# Patient Record
Sex: Male | Born: 1950 | Race: White
Health system: Southern US, Community
[De-identification: ages and names within clinical notes are randomized; demographics above are authoritative.]

## PROBLEM LIST (undated history)

## (undated) DIAGNOSIS — I10 Essential (primary) hypertension: Secondary | ICD-10-CM

## (undated) DIAGNOSIS — N189 Chronic kidney disease, unspecified: Secondary | ICD-10-CM

## (undated) DIAGNOSIS — E785 Hyperlipidemia, unspecified: Secondary | ICD-10-CM

## (undated) DIAGNOSIS — I2510 Atherosclerotic heart disease of native coronary artery without angina pectoris: Secondary | ICD-10-CM

## (undated) DIAGNOSIS — H269 Unspecified cataract: Secondary | ICD-10-CM

## (undated) DIAGNOSIS — Z86010 Personal history of colonic polyps: Secondary | ICD-10-CM

## (undated) DIAGNOSIS — M1990 Unspecified osteoarthritis, unspecified site: Secondary | ICD-10-CM

## (undated) DIAGNOSIS — Z87442 Personal history of urinary calculi: Secondary | ICD-10-CM

## (undated) HISTORY — PX: JOINT REPLACEMENT: SHX530

## (undated) HISTORY — PX: TOTAL KNEE ARTHROPLASTY: SHX125

## (undated) HISTORY — DX: Chronic kidney disease, unspecified: N18.9

## (undated) HISTORY — PX: SHOULDER SURGERY: SHX246

## (undated) HISTORY — PX: CARDIAC CATHETERIZATION: SHX172

## (undated) HISTORY — DX: Personal history of urinary calculi: Z87.442

## (undated) HISTORY — PX: APPENDECTOMY: SHX54

## (undated) HISTORY — DX: Unspecified cataract: H26.9

## (undated) HISTORY — DX: Essential (primary) hypertension: I10

## (undated) HISTORY — PX: CARPAL TUNNEL RELEASE: SHX101

## (undated) HISTORY — PX: EYE SURGERY: SHX253

## (undated) HISTORY — PX: KNEE ARTHROSCOPY: SHX127

## (undated) HISTORY — PX: CATARACT EXTRACTION: SUR2

## (undated) HISTORY — DX: Hyperlipidemia, unspecified: E78.5

## (undated) HISTORY — PX: HERNIA REPAIR: SHX51

## (undated) HISTORY — DX: Unspecified osteoarthritis, unspecified site: M19.90

## (undated) HISTORY — PX: MENISCUS REPAIR: SHX5179

## (undated) HISTORY — PX: KNEE SURGERY: SHX244

## (undated) HISTORY — DX: Personal history of colonic polyps: Z86.010

## (undated) HISTORY — PX: REPLACEMENT TOTAL KNEE: SUR1224

---

## 1998-06-26 ENCOUNTER — Ambulatory Visit (HOSPITAL_BASED_OUTPATIENT_CLINIC_OR_DEPARTMENT_OTHER): Admission: RE | Admit: 1998-06-26 | Discharge: 1998-06-26 | Payer: Self-pay | Admitting: General Surgery

## 2003-02-15 ENCOUNTER — Encounter: Admission: RE | Admit: 2003-02-15 | Discharge: 2003-02-15 | Payer: Self-pay | Admitting: Orthopaedic Surgery

## 2005-03-26 ENCOUNTER — Ambulatory Visit (HOSPITAL_COMMUNITY): Admission: RE | Admit: 2005-03-26 | Discharge: 2005-03-26 | Payer: Self-pay | Admitting: Orthopedic Surgery

## 2005-06-21 ENCOUNTER — Encounter: Payer: Self-pay | Admitting: Internal Medicine

## 2005-11-24 ENCOUNTER — Ambulatory Visit: Payer: Self-pay | Admitting: Internal Medicine

## 2005-12-02 ENCOUNTER — Ambulatory Visit: Payer: Self-pay | Admitting: Internal Medicine

## 2006-02-07 ENCOUNTER — Ambulatory Visit: Payer: Self-pay | Admitting: Internal Medicine

## 2007-01-17 ENCOUNTER — Ambulatory Visit: Payer: Self-pay | Admitting: Internal Medicine

## 2007-01-17 DIAGNOSIS — M1990 Unspecified osteoarthritis, unspecified site: Secondary | ICD-10-CM

## 2007-01-17 DIAGNOSIS — Z87442 Personal history of urinary calculi: Secondary | ICD-10-CM | POA: Insufficient documentation

## 2007-01-17 HISTORY — DX: Personal history of urinary calculi: Z87.442

## 2007-01-17 HISTORY — DX: Unspecified osteoarthritis, unspecified site: M19.90

## 2007-01-17 LAB — CONVERTED CEMR LAB: PSA: 2.08 ng/mL (ref 0.10–4.00)

## 2007-01-19 ENCOUNTER — Ambulatory Visit: Payer: Self-pay | Admitting: Internal Medicine

## 2007-02-16 ENCOUNTER — Telehealth: Payer: Self-pay | Admitting: Internal Medicine

## 2007-11-14 ENCOUNTER — Ambulatory Visit: Payer: Self-pay | Admitting: Internal Medicine

## 2007-11-14 LAB — CONVERTED CEMR LAB
ALT: 33 units/L (ref 0–53)
ALT: 30 units/L (ref 0–40)
AST: 38 units/L — ABNORMAL HIGH (ref 0–37)
AST: 31 units/L (ref 0–37)
Albumin: 4.5 g/dL (ref 3.5–5.2)
Albumin: 4.3 g/dL (ref 3.5–5.2)
Alkaline Phosphatase: 40 units/L (ref 39–117)
Alkaline Phosphatase: 44 units/L (ref 39–117)
BUN: 13 mg/dL (ref 6–23)
BUN: 11 mg/dL (ref 6–23)
Basophils Absolute: 0.1 10*3/uL (ref 0.0–0.1)
Basophils Absolute: 0 10*3/uL (ref 0.0–0.1)
Basophils Relative: 2 % (ref 0.0–3.0)
Basophils Relative: 0.5 % (ref 0.0–1.0)
Bilirubin, Direct: 0.2 mg/dL (ref 0.0–0.3)
CO2: 29 meq/L (ref 19–32)
CO2: 28 meq/L (ref 19–32)
Calcium: 9.6 mg/dL (ref 8.4–10.5)
Calcium: 9.6 mg/dL (ref 8.4–10.5)
Chloride: 107 meq/L (ref 96–112)
Chloride: 105 meq/L (ref 96–112)
Cholesterol: 292 mg/dL (ref 0–200)
Cholesterol: 278 mg/dL (ref 0–200)
Creatinine, Ser: 0.8 mg/dL (ref 0.4–1.5)
Creatinine, Ser: 0.9 mg/dL (ref 0.4–1.5)
Direct LDL: 155 mg/dL
Direct LDL: 179.9 mg/dL
Eosinophils Absolute: 0.2 10*3/uL (ref 0.0–0.7)
Eosinophils Absolute: 0.1 10*3/uL (ref 0.0–0.6)
Eosinophils Relative: 2.7 % (ref 0.0–5.0)
Eosinophils Relative: 2.1 % (ref 0.0–5.0)
GFR calc Af Amer: 128 mL/min
GFR calc Af Amer: 113 mL/min
GFR calc non Af Amer: 106 mL/min
GFR calc non Af Amer: 93 mL/min
Glucose, Bld: 93 mg/dL (ref 70–99)
Glucose, Bld: 89 mg/dL (ref 70–99)
HCT: 43.5 % (ref 39.0–52.0)
HCT: 43.1 % (ref 39.0–52.0)
HDL: 61.2 mg/dL (ref >39.0)
HDL: 58.3 mg/dL (ref >39.0)
Hemoglobin: 14.9 g/dL (ref 13.0–17.0)
Hemoglobin: 14.7 g/dL (ref 13.0–17.0)
Lymphocytes Relative: 26.1 % (ref 12.0–46.0)
Lymphocytes Relative: 33.6 % (ref 12.0–46.0)
MCHC: 34.2 g/dL (ref 30.0–36.0)
MCHC: 34.2 g/dL (ref 30.0–36.0)
MCV: 93.5 fL (ref 78.0–100.0)
MCV: 91.6 fL (ref 78.0–100.0)
Monocytes Absolute: 0.7 10*3/uL (ref 0.1–1.0)
Monocytes Absolute: 0.5 10*3/uL (ref 0.2–0.7)
Monocytes Relative: 11.9 % (ref 3.0–12.0)
Monocytes Relative: 12.9 % — ABNORMAL HIGH (ref 3.0–11.0)
Neutro Abs: 3.2 10*3/uL (ref 1.4–7.7)
Neutro Abs: 2.2 10*3/uL (ref 1.4–7.7)
Neutrophils Relative %: 57.3 % (ref 43.0–77.0)
Neutrophils Relative %: 50.9 % (ref 43.0–77.0)
PSA: 2.61 ng/mL (ref 0.10–4.00)
PSA: 1.75 ng/mL (ref 0.10–4.00)
Platelets: 217 10*3/uL (ref 150–400)
Platelets: 229 10*3/uL (ref 150–400)
Potassium: 4.8 meq/L (ref 3.5–5.1)
Potassium: 4.3 meq/L (ref 3.5–5.1)
RBC: 4.65 M/uL (ref 4.22–5.81)
RBC: 4.71 M/uL (ref 4.22–5.81)
RDW: 11.6 % (ref 11.5–14.6)
RDW: 11.7 % (ref 11.5–14.6)
Sodium: 140 meq/L (ref 135–145)
Sodium: 140 meq/L (ref 135–145)
TSH: 1.38 microintl units/mL (ref 0.35–5.50)
TSH: 0.65 microintl units/mL (ref 0.35–5.50)
Total Bilirubin: 1.2 mg/dL (ref 0.3–1.2)
Total Bilirubin: 1 mg/dL (ref 0.3–1.2)
Total CHOL/HDL Ratio: 4.8
Total CHOL/HDL Ratio: 4.8
Total Protein: 7.7 g/dL (ref 6.0–8.3)
Total Protein: 7.3 g/dL (ref 6.0–8.3)
Triglycerides: 345 mg/dL (ref 0–149)
Triglycerides: 132 mg/dL (ref 0–149)
VLDL: 69 mg/dL — ABNORMAL HIGH (ref 0–40)
VLDL: 26 mg/dL (ref 0–40)
WBC: 5.7 10*3/uL (ref 4.5–10.5)
WBC: 4.2 10*3/uL — ABNORMAL LOW (ref 4.5–10.5)

## 2007-11-21 ENCOUNTER — Ambulatory Visit: Payer: Self-pay | Admitting: Internal Medicine

## 2007-11-21 DIAGNOSIS — E785 Hyperlipidemia, unspecified: Secondary | ICD-10-CM

## 2007-11-21 HISTORY — DX: Hyperlipidemia, unspecified: E78.5

## 2007-11-29 ENCOUNTER — Telehealth: Payer: Self-pay | Admitting: Internal Medicine

## 2007-12-14 ENCOUNTER — Ambulatory Visit: Payer: Self-pay | Admitting: Internal Medicine

## 2007-12-28 ENCOUNTER — Encounter: Payer: Self-pay | Admitting: Internal Medicine

## 2007-12-28 ENCOUNTER — Ambulatory Visit: Payer: Self-pay | Admitting: Internal Medicine

## 2007-12-28 LAB — HM COLONOSCOPY

## 2008-01-01 ENCOUNTER — Encounter: Payer: Self-pay | Admitting: Internal Medicine

## 2008-01-22 ENCOUNTER — Inpatient Hospital Stay (HOSPITAL_COMMUNITY): Admission: RE | Admit: 2008-01-22 | Discharge: 2008-01-25 | Payer: Self-pay | Admitting: Orthopedic Surgery

## 2008-02-12 ENCOUNTER — Encounter: Admission: RE | Admit: 2008-02-12 | Discharge: 2008-02-28 | Payer: Self-pay | Admitting: Orthopedic Surgery

## 2008-03-04 ENCOUNTER — Encounter: Admission: RE | Admit: 2008-03-04 | Discharge: 2008-03-12 | Payer: Self-pay | Admitting: Orthopedic Surgery

## 2008-03-11 ENCOUNTER — Ambulatory Visit (HOSPITAL_COMMUNITY): Admission: RE | Admit: 2008-03-11 | Discharge: 2008-03-11 | Payer: Self-pay | Admitting: Orthopedic Surgery

## 2008-09-24 ENCOUNTER — Ambulatory Visit: Payer: Self-pay | Admitting: Internal Medicine

## 2008-09-24 DIAGNOSIS — H6590 Unspecified nonsuppurative otitis media, unspecified ear: Secondary | ICD-10-CM | POA: Insufficient documentation

## 2008-12-24 ENCOUNTER — Ambulatory Visit: Payer: Self-pay | Admitting: Internal Medicine

## 2009-02-05 ENCOUNTER — Ambulatory Visit: Payer: Self-pay | Admitting: Internal Medicine

## 2009-02-05 LAB — CONVERTED CEMR LAB
ALT: 26 units/L (ref 0–53)
AST: 33 units/L (ref 0–37)
Albumin: 4.4 g/dL (ref 3.5–5.2)
Alkaline Phosphatase: 41 units/L (ref 39–117)
BUN: 11 mg/dL (ref 6–23)
Basophils Absolute: 0 10*3/uL (ref 0.0–0.1)
Basophils Relative: 0.6 % (ref 0.0–3.0)
Bilirubin Urine: NEGATIVE
Bilirubin, Direct: 0.1 mg/dL (ref 0.0–0.3)
Blood in Urine, dipstick: NEGATIVE
CO2: 29 meq/L (ref 19–32)
Calcium: 9.6 mg/dL (ref 8.4–10.5)
Chloride: 106 meq/L (ref 96–112)
Cholesterol: 268 mg/dL — ABNORMAL HIGH (ref 0–200)
Creatinine, Ser: 0.9 mg/dL (ref 0.4–1.5)
Direct LDL: 157.7 mg/dL
Eosinophils Absolute: 0.1 10*3/uL (ref 0.0–0.7)
Eosinophils Relative: 2.4 % (ref 0.0–5.0)
GFR calc non Af Amer: 91.74 mL/min (ref >60)
Glucose, Bld: 98 mg/dL (ref 70–99)
Glucose, Urine, Semiquant: NEGATIVE
HCT: 42.6 % (ref 39.0–52.0)
HDL: 59.5 mg/dL (ref >39.00)
Hemoglobin: 14.5 g/dL (ref 13.0–17.0)
Ketones, urine, test strip: NEGATIVE
Lymphocytes Relative: 34.3 % (ref 12.0–46.0)
Lymphs Abs: 1.7 10*3/uL (ref 0.7–4.0)
MCHC: 34.1 g/dL (ref 30.0–36.0)
MCV: 95.3 fL (ref 78.0–100.0)
Monocytes Absolute: 0.6 10*3/uL (ref 0.1–1.0)
Monocytes Relative: 12 % (ref 3.0–12.0)
Neutro Abs: 2.5 10*3/uL (ref 1.4–7.7)
Neutrophils Relative %: 50.7 % (ref 43.0–77.0)
Nitrite: NEGATIVE
PSA: 1.44 ng/mL (ref 0.10–4.00)
Platelets: 189 10*3/uL (ref 150.0–400.0)
Potassium: 4.6 meq/L (ref 3.5–5.1)
Protein, U semiquant: NEGATIVE
RBC: 4.47 M/uL (ref 4.22–5.81)
RDW: 11.4 % — ABNORMAL LOW (ref 11.5–14.6)
Sodium: 142 meq/L (ref 135–145)
Specific Gravity, Urine: 1.02
TSH: 0.92 microintl units/mL (ref 0.35–5.50)
Total Bilirubin: 0.9 mg/dL (ref 0.3–1.2)
Total CHOL/HDL Ratio: 5
Total Protein: 7.7 g/dL (ref 6.0–8.3)
Triglycerides: 269 mg/dL — ABNORMAL HIGH (ref 0.0–149.0)
Urobilinogen, UA: 0.2
VLDL: 53.8 mg/dL — ABNORMAL HIGH (ref 0.0–40.0)
WBC Urine, dipstick: NEGATIVE
WBC: 4.9 10*3/uL (ref 4.5–10.5)
pH: 5.5

## 2009-02-12 ENCOUNTER — Ambulatory Visit: Payer: Self-pay | Admitting: Internal Medicine

## 2009-02-12 DIAGNOSIS — Z860100 Personal history of colon polyps, unspecified: Secondary | ICD-10-CM | POA: Insufficient documentation

## 2009-02-12 HISTORY — DX: Personal history of colon polyps, unspecified: Z86.0100

## 2009-02-12 HISTORY — DX: Personal history of colonic polyps: Z86.010

## 2009-04-07 ENCOUNTER — Ambulatory Visit: Payer: Self-pay | Admitting: Internal Medicine

## 2009-04-07 DIAGNOSIS — M25519 Pain in unspecified shoulder: Secondary | ICD-10-CM | POA: Insufficient documentation

## 2009-11-27 ENCOUNTER — Encounter: Payer: Self-pay | Admitting: Internal Medicine

## 2010-02-13 ENCOUNTER — Ambulatory Visit: Payer: Self-pay | Admitting: Internal Medicine

## 2010-03-25 ENCOUNTER — Ambulatory Visit
Admission: RE | Admit: 2010-03-25 | Discharge: 2010-03-25 | Payer: Self-pay | Source: Home / Self Care | Attending: Internal Medicine | Admitting: Internal Medicine

## 2010-03-25 ENCOUNTER — Other Ambulatory Visit: Payer: Self-pay | Admitting: Internal Medicine

## 2010-03-25 LAB — CONVERTED CEMR LAB
Bilirubin Urine: NEGATIVE
Blood in Urine, dipstick: NEGATIVE
Glucose, Urine, Semiquant: NEGATIVE
Ketones, urine, test strip: NEGATIVE
Nitrite: NEGATIVE
Protein, U semiquant: NEGATIVE
Specific Gravity, Urine: <1.005
Urobilinogen, UA: 0.2
WBC Urine, dipstick: NEGATIVE
pH: 5.5

## 2010-03-25 LAB — BASIC METABOLIC PANEL
BUN: 10 mg/dL (ref 6–23)
CO2: 30 mEq/L (ref 19–32)
Calcium: 9.8 mg/dL (ref 8.4–10.5)
Chloride: 102 mEq/L (ref 96–112)
Creatinine, Ser: 0.8 mg/dL (ref 0.4–1.5)
GFR: 100.34 mL/min (ref >60.00)
Glucose, Bld: 90 mg/dL (ref 70–99)
Potassium: 4.4 mEq/L (ref 3.5–5.1)
Sodium: 138 mEq/L (ref 135–145)

## 2010-03-25 LAB — LDL CHOLESTEROL, DIRECT: Direct LDL: 166.4 mg/dL

## 2010-03-25 LAB — CBC WITH DIFFERENTIAL/PLATELET
Basophils Absolute: 0 10*3/uL (ref 0.0–0.1)
Basophils Relative: 0.8 % (ref 0.0–3.0)
Eosinophils Absolute: 0.4 10*3/uL (ref 0.0–0.7)
Eosinophils Relative: 5.9 % — ABNORMAL HIGH (ref 0.0–5.0)
HCT: 42.1 % (ref 39.0–52.0)
Hemoglobin: 14.4 g/dL (ref 13.0–17.0)
Lymphocytes Relative: 27.2 % (ref 12.0–46.0)
Lymphs Abs: 1.7 10*3/uL (ref 0.7–4.0)
MCHC: 34.3 g/dL (ref 30.0–36.0)
MCV: 93.9 fl (ref 78.0–100.0)
Monocytes Absolute: 0.7 10*3/uL (ref 0.1–1.0)
Monocytes Relative: 11.2 % (ref 3.0–12.0)
Neutro Abs: 3.4 10*3/uL (ref 1.4–7.7)
Neutrophils Relative %: 54.9 % (ref 43.0–77.0)
Platelets: 201 10*3/uL (ref 150.0–400.0)
RBC: 4.48 Mil/uL (ref 4.22–5.81)
RDW: 12.2 % (ref 11.5–14.6)
WBC: 6.2 10*3/uL (ref 4.5–10.5)

## 2010-03-25 LAB — HEPATIC FUNCTION PANEL
ALT: 35 U/L (ref 0–53)
AST: 40 U/L — ABNORMAL HIGH (ref 0–37)
Albumin: 4.5 g/dL (ref 3.5–5.2)
Alkaline Phosphatase: 41 U/L (ref 39–117)
Bilirubin, Direct: 0.1 mg/dL (ref 0.0–0.3)
Total Bilirubin: 1.1 mg/dL (ref 0.3–1.2)
Total Protein: 7.4 g/dL (ref 6.0–8.3)

## 2010-03-25 LAB — LIPID PANEL
Cholesterol: 260 mg/dL — ABNORMAL HIGH (ref 0–200)
HDL: 80 mg/dL (ref >39.00)
Total CHOL/HDL Ratio: 3
Triglycerides: 139 mg/dL (ref 0.0–149.0)
VLDL: 27.8 mg/dL (ref 0.0–40.0)

## 2010-03-25 LAB — PSA: PSA: 2.36 ng/mL (ref 0.10–4.00)

## 2010-03-25 LAB — TSH: TSH: 0.89 u[IU]/mL (ref 0.35–5.50)

## 2010-03-31 ENCOUNTER — Encounter: Payer: Self-pay | Admitting: Internal Medicine

## 2010-03-31 ENCOUNTER — Ambulatory Visit
Admission: RE | Admit: 2010-03-31 | Discharge: 2010-03-31 | Payer: Self-pay | Source: Home / Self Care | Attending: Internal Medicine | Admitting: Internal Medicine
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
## 2010-03-31 NOTE — Assessment & Plan Note (Signed)
Summary: cpx/njr   Vital Signs:  Patient profile:   60 year old male Weight:      163 pounds BMI:     23.47 BP sitting:   142 / 80  (left arm) Cuff size:   regular  Vitals Entered By: Raechel Ache, RN (February 12, 2009 9:35 AM) CC: CPX, labs done.   CC:  CPX and labs done.Marland Kitchen  History of Present Illness:  60 year old patient who is seen today for a health maintenance exam.  He does quite well clinically.  He has osteoarthritis and is status post right total knee replacement surgery.  13 months ago.  He has dyslipidemia with a high HDL.  No concerns or complaints.  Allergies: No Known Drug Allergies  Past History:  Past Medical History: Hyperlipidemia Osteoarthritis Nephrolithiasis, hx of Colonic polyps, hx of  Past Surgical History: Appendectomy Inguinal herniorrhaphy bilateral bilateral knee arthroscopic surgery R TKR 11/09  colonoscopy 2004, 2009  Family History: Reviewed history from 11/21/2007 and no changes required. father died age 60, status post CABG;  complications of colon cancer  mother age 60  history of asthma, status post thyroidectomy, still lives independently  Uncle died of MI at age 60  Three sisters are well  Social History: Reviewed history from 11/21/2007 and no changes required. Never Smoked Regular exercise-yes  Review of Systems  The patient denies anorexia, fever, weight loss, weight gain, vision loss, decreased hearing, hoarseness, chest pain, syncope, dyspnea on exertion, peripheral edema, prolonged cough, headaches, hemoptysis, abdominal pain, melena, hematochezia, severe indigestion/heartburn, hematuria, incontinence, genital sores, muscle weakness, suspicious skin lesions, transient blindness, difficulty walking, depression, unusual weight change, abnormal bleeding, enlarged lymph nodes, angioedema, breast masses, and testicular masses.    Physical Exam  General:  Well-developed,well-nourished,in no acute distress;  alert,appropriate and cooperative throughout examination; 140 over 80 Head:  Normocephalic and atraumatic without obvious abnormalities. No apparent alopecia or balding. Eyes:  No corneal or conjunctival inflammation noted. EOMI. Perrla. Funduscopic exam benign, without hemorrhages, exudates or papilledema. Vision grossly normal. Ears:  External ear exam shows no significant lesions or deformities.  Otoscopic examination reveals clear canals, tympanic membranes are intact bilaterally without bulging, retraction, inflammation or discharge. Hearing is grossly normal bilaterally. Nose:  External nasal examination shows no deformity or inflammation. Nasal mucosa are pink and moist without lesions or exudates. Mouth:  Oral mucosa and oropharynx without lesions or exudates.  Teeth in good repair. Neck:  No deformities, masses, or tenderness noted. Chest Wall:  No deformities, masses, tenderness or gynecomastia noted. Breasts:  No masses or gynecomastia noted Lungs:  Normal respiratory effort, chest expands symmetrically. Lungs are clear to auscultation, no crackles or wheezes. Heart:  Normal rate and regular rhythm. S1 and S2 normal without gallop, murmur, click, rub or other extra sounds. Abdomen:  Bowel sounds positive,abdomen soft and non-tender without masses, organomegaly or hernias noted. Rectal:  No external abnormalities noted. Normal sphincter tone. No rectal masses or tenderness. Genitalia:  Testes bilaterally descended without nodularity, tenderness or masses. No scrotal masses or lesions. No penis lesions or urethral discharge. Prostate:  1+ enlarged.  1+ enlarged.   Msk:  No deformity or scoliosis noted of thoracic or lumbar spine.   Pulses:  R and L carotid,radial,femoral,dorsalis pedis and posterior tibial pulses are full and equal bilaterally Extremities:  No clubbing, cyanosis, edema, or deformity noted with normal full range of motion of all joints.   Neurologic:  No cranial nerve  deficits noted. Station and gait are normal. Plantar  reflexes are down-going bilaterally. DTRs are symmetrical throughout. Sensory, motor and coordinative functions appear intact. Skin:  Intact without suspicious lesions or rashes Cervical Nodes:  No lymphadenopathy noted Axillary Nodes:  No palpable lymphadenopathy Inguinal Nodes:  No significant adenopathy Psych:  Cognition and judgment appear intact. Alert and cooperative with normal attention span and concentration. No apparent delusions, illusions, hallucinations   Impression & Recommendations:  Problem # 1:  COLONIC POLYPS, HX OF (ICD-V12.72)  Problem # 2:  NEPHROLITHIASIS, HX OF (ICD-V13.01)  Problem # 3:  OSTEOARTHRITIS (ICD-715.90)  The following medications were removed from the medication list:    Vicodin 5-500 Mg Tabs (Hydrocodone-acetaminophen) ..... One by mouth q 6 hours as needed for pain  The following medications were removed from the medication list:    Vicodin 5-500 Mg Tabs (Hydrocodone-acetaminophen) ..... One by mouth q 6 hours as needed for pain  Problem # 4:  HYPERLIPIDEMIA (ICD-272.4)  Complete Medication List: 1)  Viagra 100 Mg Tabs (Sildenafil citrate) .... Uad  Patient Instructions: 1)  Please schedule a follow-up appointment in 1 year. 2)  Limit your Sodium (Salt). 3)  It is important that you exercise regularly at least 20 minutes 5 times a week. If you develop chest pain, have severe difficulty breathing, or feel very tired , stop exercising immediately and seek medical attention. 4)  Check your Blood Pressure regularly. If it is above:150/90  you should make an appointment. Prescriptions: VIAGRA 100 MG TABS (SILDENAFIL CITRATE) UAD  #12 x 6   Entered and Authorized by:   Gordy Savers  MD   Signed by:   Gordy Savers  MD on 02/12/2009   Method used:   Print then Give to Patient   RxID:   [id number]

## 2010-03-31 NOTE — Procedures (Signed)
Summary: Colonoscopy   Colonoscopy  Procedure date:  12/28/2007  Findings:      Location:  [facility].    Procedures Next Due Date:    Colonoscopy: 12/2012  Patient Name: Timothy [name], Timothy [name]. MRN:  Procedure Procedures: Colonoscopy CPT: [phone].    with polypectomy. CPT: [id number].  Personnel: Endoscopist: Wilhemina Bonito. Marina Goodell, MD.  Exam Location: Exam performed in Outpatient Clinic. Outpatient  Patient Consent: Procedure, Alternatives, Risks and Benefits discussed, consent obtained, from patient. Consent was obtained by the RN.  Indications  Increased Risk Screening: For family history of colorectal neoplasia, in  parent  History  Current Medications: Patient is not currently taking Coumadin.  Pre-Exam Physical: Performed Dec 28, 2007. Cardio-pulmonary exam, Rectal exam, HEENT exam , Abdominal exam, Mental status exam WNL.  Comments: Pt. history reviewed/updated, physical exam performed prior to initiation of sedation?yes Exam Exam: Extent of exam reached: Cecum, extent intended: Cecum.  The cecum was identified by appendiceal orifice and IC valve. Time to Cecum: 00:03: 30. Time for Withdrawl: 00:10:36. Colon retroflexion performed. Images taken. ASA Classification: I. Tolerance: excellent.  Monitoring: Pulse and BP monitoring, Oximetry used. Supplemental O2 given.  Colon Prep Used Movi prep for colon prep. Prep results: excellent.  Sedation Meds: Patient assessed and found to be appropriate for moderate (conscious) sedation. Fentanyl 100 mcg. given IV. Versed 12 given IV.  Findings NORMAL EXAM: Cecum to Rectum.  POLYP: Cecum, Maximum size: 5 mm. Procedure:  snare without cautery, removed, retrieved, Polyp sent to pathology. ICD9: Colon Polyps: 211. 3.  DIVERTICULOSIS: Sigmoid Colon. ICD9: Diverticulosis, Colon: 562.10.   Assessment  Diagnoses: 211.3: Colon Polyps.  one.  562.10: Diverticulosis, Colon.  mild.  455.0: Hemorrhoids, Internal.    Events  Unplanned Interventions: No intervention was required.  Unplanned Events: There were no complications. Plans Disposition: After procedure patient sent to recovery. After recovery patient sent home.  Scheduling/Referral: Colonoscopy, to Wilhemina Bonito. Marina Goodell, MD, in 5 years (fam. hx),    cc: Eleonore Chiquito, MD   REPORT OF SURGICAL PATHOLOGY   Case #: [id number] Patient Name: Timothy [name]. Office Chart Number:  [id number]   MRN: [id number] Pathologist: Alden Server A. Delila Spence, MD DOB/Age  60/07/1949 (Age: 2)    Gender: M Date Taken:  12/28/2007 Date Received: 12/29/2007   FINAL DIAGNOSIS   ***MICROSCOPIC EXAMINATION AND DIAGNOSIS***   COLON, POLYP(S):  HYPERPLASTIC POLYP.  NO ADENOMATOUS CHANGE OR MALIGNANCY IDENTIFIED.   mw Date Reported:  01/01/2008     Alden Server A. Delila Spence, MD    January 01, 2008 MRN: [id number]    Timothy [name] [address] [place], [place]  [address]    Dear Timothy [name],  I am pleased to inform you that the colon polyp(s) removed during your recent colonoscopy was (were) found to be benign (no cancer detected) upon pathologic examination.  I recommend you have a repeat colonoscopy examination in 5 years to look for recurrent polyps, as having colon polyps increases your risk for having recurrent polyps or even colon cancer in the future.  Should you develop new or worsening symptoms of abdominal pain, bowel habit changes or bleeding from the rectum or bowels, please schedule an evaluation with either your primary care physician or with me.  Additional information/recommendations:  X._ No further action with gastroenterology is needed at this time. Please      follow-up with your primary care physician for your other healthcare      needs.   Please call us if you are having persistent  problems or have questions about your condition that have not been fully answered at this time.  Sincerely,  Hilarie Fredrickson MD  This report was created  from the original endoscopy report, which was reviewed and signed by the above listed endoscopist.

## 2010-03-31 NOTE — Letter (Signed)
Summary: [facility]  [facility]   Imported By: Maryln Gottron 12/03/2009 13:58:05  _____________________________________________________________________  External Attachment:    Type:   Image     Comment:   External Document

## 2010-04-02 NOTE — Assessment & Plan Note (Signed)
Summary: FLU SHOT/CJR  Nurse Visit   Allergies: No Known Drug Allergies  Immunizations Administered:  Influenza Vaccine # 1:    Vaccine Type: Fluvax 3+    Site: right deltoid    Mfr: GlaxoSmithKline    Dose: 0.5 ml    Route: IM    Given by: Kimberley Kirkland LPN    Exp. Date: 08/29/2010    Lot #: [id number]    VIS given: 09/23/09 version given February 13, 2010.    Physician counseled: yes  Flu Vaccine Consent Questions:    Do you have a history of severe allergic reactions to this vaccine? no    Any prior history of allergic reactions to egg and/or gelatin? no    Do you have a sensitivity to the preservative Thimersol? no    Do you have a past history of Guillan-Barre Syndrome? no    Do you currently have an acute febrile illness? no    Have you ever had a severe reaction to latex? no    Vaccine information given and explained to patient? yes  Orders Added: 1)  Flu Vaccine 3yrs + [90658] 2)  Admin 1st Vaccine [90471] 

## 2010-04-08 NOTE — Assessment & Plan Note (Signed)
Summary: CPX/CJR  ok to switch  Vital Signs:  [name] profile:   60 year old male Height:      70 inches Weight:      162 pounds BMI:     23.33 O2 Sat:      98 % on Room air Temp:     98.1 degrees F oral Pulse rate:   80 / minute Resp:     16 per minute BP sitting:   116 / 80  (right arm) Cuff size:   regular  Vitals Entered By: Duard Brady LPN (March 31, 2010 9:23 AM)  O2 Flow:  Room air CC: cpx - doing well  Is [name] Diabetic? No   CC:  cpx - doing well .  History of Present Illness: Timothy [name] who is seen today for a health maintenance examination.  He does remarkably well and exercises regularly.  He does have a history of mild osteoarthritis and remote history of nephrolithiasis.  He has a history of colonic polyps and a family history of colon cancer.  He has a history of dyslipidemia with a high HDL  Preventive Screening-Counseling & Management  Alcohol-Tobacco     Smoking Status: quit     Year Quit: 40 yrs ago  Allergies (verified): No Known Drug Allergies  Past History:  Past Medical History: Reviewed history from 02/12/2009 and no changes required. Hyperlipidemia Osteoarthritis Nephrolithiasis, hx of Colonic polyps, hx of  Past Surgical History: Appendectomy Inguinal herniorrhaphy bilateral bilateral knee arthroscopic surgery R TKR 11/09 s/p L shoulder 7-11 s/p R Carpal tunnel release 10-11 colonoscopy 2004, 2009  Family History: Reviewed history from 02/12/2009 and no changes required. father died age 2, status post CABG;  complications of colon cancer  mother age 72  history of asthma, status post thyroidectomy, still lives independently  Uncle died of MI at age 60  Three sisters are well  Social History: Reviewed history from 11/21/2007 and no changes required. Never Smoked Regular exercise-yes Smoking Status:  quit  Review of Systems  The [name] denies anorexia, fever, weight loss, weight gain, vision loss,  decreased hearing, hoarseness, chest pain, syncope, dyspnea on exertion, peripheral edema, prolonged cough, headaches, hemoptysis, abdominal pain, melena, hematochezia, severe indigestion/heartburn, hematuria, incontinence, genital sores, muscle weakness, suspicious skin lesions, transient blindness, difficulty walking, depression, unusual weight change, abnormal bleeding, enlarged lymph nodes, angioedema, breast masses, and testicular masses.    Physical Exam  General:  Well-developed,well-nourished,in no acute distress; alert,appropriate and cooperative throughout examination Head:  Normocephalic and atraumatic without obvious abnormalities. No apparent alopecia or balding. Eyes:  No corneal or conjunctival inflammation noted. EOMI. Perrla. Funduscopic exam benign, without hemorrhages, exudates or papilledema. Vision grossly normal. Ears:  External ear exam shows no significant lesions or deformities.  Otoscopic examination reveals clear canals, tympanic membranes are intact bilaterally without bulging, retraction, inflammation or discharge. Hearing is grossly normal bilaterally. Nose:  External nasal examination shows no deformity or inflammation. Nasal mucosa are pink and moist without lesions or exudates. Mouth:  Oral mucosa and oropharynx without lesions or exudates.  Teeth in good repair. Neck:  No deformities, masses, or tenderness noted. Chest Wall:  No deformities, masses, tenderness or gynecomastia noted. Breasts:  No masses or gynecomastia noted Lungs:  Normal respiratory effort, chest expands symmetrically. Lungs are clear to auscultation, no crackles or wheezes. Heart:  Normal rate and regular rhythm. S1 and S2 normal without gallop, murmur, click, rub or other extra sounds. Abdomen:  Bowel sounds positive,abdomen soft and non-tender without  masses, organomegaly or hernias noted. Rectal:  No external abnormalities noted. Normal sphincter tone. No rectal masses or tenderness. Genitalia:   Testes bilaterally descended without nodularity, tenderness or masses. No scrotal masses or lesions. No penis lesions or urethral discharge. Prostate:  Prostate gland firm and smooth, no enlargement, nodularity, tenderness, mass, asymmetry or induration. Msk:  No deformity or scoliosis noted of thoracic or lumbar spine.   Pulses:  R and L carotid,radial,femoral,dorsalis pedis and posterior tibial pulses are full and equal bilaterally Extremities:  No clubbing, cyanosis, edema, or deformity noted with normal full range of motion of all joints.   Neurologic:  No cranial nerve deficits noted. Station and gait are normal. Plantar reflexes are down-going bilaterally. DTRs are symmetrical throughout. Sensory, motor and coordinative functions appear intact. Skin:  Intact without suspicious lesions or rashes Cervical Nodes:  No lymphadenopathy noted Axillary Nodes:  No palpable lymphadenopathy Inguinal Nodes:  No significant adenopathy Psych:  Cognition and judgment appear intact. Alert and cooperative with normal attention span and concentration. No apparent delusions, illusions, hallucinations   Impression & Recommendations:  Problem # 1:  HEALTH MAINTENANCE EXAM (ICD-V70.0)  Orders: EKG w/ Interpretation (93000)  Complete Medication List: 1)  Viagra 100 Mg Tabs (Sildenafil citrate) .... Uad  [name] Instructions: 1)  Please schedule a follow-up appointment in 1 year. 2)  Limit your Sodium (Salt). 3)  It is important that you exercise regularly at least 20 minutes 5 times a week. If you develop chest pain, have severe difficulty breathing, or feel very tired , stop exercising immediately and seek medical attention. Prescriptions: VIAGRA 100 MG TABS (SILDENAFIL CITRATE) UAD  #6 x 6   Entered and Authorized by:   Gordy Savers  MD   Signed by:   Gordy Savers  MD on 03/31/2010   Method used:   Electronically to        [facility]  [address] [phone]* (retail)       [address]       [place], [place]  [address]       Ph: [phone] or [phone]       Fax: [phone]   RxID:   [id number]  Will  Orders Added: 1)  EKG w/ Interpretation [93000] 2)  Est. [name] 40-64 years [phone]

## 2010-06-01 ENCOUNTER — Ambulatory Visit: Payer: Self-pay | Admitting: Internal Medicine

## 2010-06-03 ENCOUNTER — Encounter: Payer: Self-pay | Admitting: Internal Medicine

## 2010-06-08 ENCOUNTER — Ambulatory Visit (INDEPENDENT_AMBULATORY_CARE_PROVIDER_SITE_OTHER): Payer: BLUE CROSS/BLUE SHIELD | Admitting: Internal Medicine

## 2010-06-08 DIAGNOSIS — Z2911 Encounter for prophylactic immunotherapy for respiratory syncytial virus (RSV): Secondary | ICD-10-CM

## 2010-06-08 DIAGNOSIS — Z0000 Encounter for general adult medical examination without abnormal findings: Secondary | ICD-10-CM

## 2010-06-15 LAB — HEMOGLOBIN AND HEMATOCRIT, BLOOD
HCT: 36.9 % — ABNORMAL LOW (ref 39.0–52.0)
Hemoglobin: 12.8 g/dL — ABNORMAL LOW (ref 13.0–17.0)

## 2010-07-14 NOTE — H&P (Signed)
NAME:  Timothy [name], KUHRT NO.:  [id number]   MEDICAL RECORD NO.:  [id number]          PATIENT TYPE:  INP   LOCATION:  0008                         FACILITY[facility]  [facility]   PHYSICIAN:  Ollen Gross, M.D.    DATE OF BIRTH:  12/04/1949   DATE OF ADMISSION:  01/22/2008  DATE OF DISCHARGE:                              HISTORY & PHYSICAL   CHIEF COMPLAINT:  Right knee pain.   HISTORY OF PRESENT ILLNESS:  The patient is a 60 year old male who has  been seen by Dr. Lequita Halt for ongoing right knee pain.  He has bilateral  knee pain, the right greater than left for quite some time now.  For the  past 2 years the right knee has been hurting him and progressively  getting worse.  He has been seen in the office by Dr. Lequita Halt.  X-rays  show he has progressive to the point where now he is bone-on-bone in the  medial and patella femoral components over time.  It is felt he would  benefit from undergoing surgical intervention and he has been seen  preoperatively by Dr. Amador Cunas and felt to be stable for surgery.  Risks and benefits discussed.  The patient subsequently admitted to the  hospital.   ALLERGIES:  No known drug allergies.   PAST MEDICAL HISTORY:  Negative.   CURRENT MEDICATIONS:  Tylenol.   PAST SURGICAL HISTORY:  1. Right knee scope x1.  2. Left knee scope x2.  3. Hernia repair x2.  4. Appendectomy.  5. Colonoscopy.   FAMILY HISTORY:  Father with colon cancer.   SOCIAL HISTORY:  Married, Art gallery manager, nonsmoker.  No alcohol.  Family does  have a caregiver lined up.  Lives with family, does have a living will  and a healthcare power attorney.   REVIEW OF SYSTEMS:  GENERAL:  No fevers, chills, night sweats.  NEURO:  No seizures, syncope or paralysis.  RESPIRATORY:  No shortness breath,  productive cough, hemoptysis.  CARDIOVASCULAR:  Chest pain, orthopnea.  GI:  No nausea, vomiting, constipation.  GU:  No dysuria, hematuria,  discharge.  MUSCULOSKELETAL:  Knee  pain.   PHYSICAL EXAMINATION:  VITAL SIGNS:  Pulse 84.  Respirations 12.  Blood  pressure 148/78.  GENERAL:  A 60 year old white male well-nourished, well-developed, no  acute distress, alert and oriented, cooperative, thin appearance  accompanied by his wife.  HEENT:  Normocephalic, atraumatic.  Pupils are round and reactive.  Oropharynx clear.  EOMs intact.  NECK:  Supple.  CHEST:  Clear.  HEART:  Regular rate and rhythm.  No murmur.  ABDOMEN: Soft, flat, nontender.  Bowel sounds present.  RECTAL/BREASTS/GENITALIA:  Not done, not pertinent to present illness.  EXTREMITIES:  Right knee range of motion 5-115, slight varus  malalignment deformity, marked crepitus noted.   IMPRESSION:  Osteoarthritis right knee.   PLAN:  The patient admitted to [facility] to undergo right  total knee replacement arthroplasty.  Surgery will be performed by Dr.  Ollen Gross.      Alexzandrew L. Perkins, P.A.C.      Ollen Gross, M.D.  Electronically Signed    ALP/MEDQ  D:  01/21/2008  T:  01/22/2008  Job:  [id number]   cc:   Ollen Gross, M.D.  Fax: [phone]   Gordy Savers, MD  [address] [place]  [place] [address]

## 2010-07-14 NOTE — Op Note (Signed)
NAME:  [name], [name] NO.:  [id number]   MEDICAL RECORD NO.:  [id number]          PATIENT TYPE:  INP   LOCATION:  0008                         FACILITY:  [facility]   PHYSICIAN:  Ollen Gross, M.D.    DATE OF BIRTH:  12/04/1949   DATE OF PROCEDURE:  DATE OF DISCHARGE:                               OPERATIVE REPORT   PREOPERATIVE DIAGNOSIS:  Osteoarthritis right knee.   POSTOPERATIVE DIAGNOSIS:  Osteoarthritis right knee.   PROCEDURE:  Right total knee arthroplasty.   SURGEON:  Ollen Gross, MD   ASSISTANT:  Jamelle Rushing, PA-C   ANESTHESIA:  Spinal with Duramorph.   ESTIMATED BLOOD LOSS:  Minimal.   DRAIN:  None.   TOURNIQUET TIME:  32 minutes at 300 mmHg.   COMPLICATIONS:  None.   CONDITION:  Stable to recovery room.   CLINICAL NOTE:  Timothy [name] is a 60 year old male who has end-stage  arthritis of the right knee with progressively worsening pain and  dysfunction.  He has failed nonoperative management and presents now for  right total knee arthroplasty.   PROCEDURE IN DETAIL:  After successful administration of spinal  anesthetic, a tourniquet was placed on his right thigh, and right lower  extremity prepped and draped in the usual sterile fashion.  Extremities  wrapped in Esmarch, knee flexed, tourniquet inflated to 300 mmHg.  A  midline incision was made with 10-blade through subcutaneous tissue to  the level of the extensor mechanism.  A fresh blade is used make a  medial parapatellar arthrotomy.  Soft tissue over the proximal medial  tibia. subperiosteally elevated to the joint line with the knife and  into the semimembranosus bursa with a Cobb elevator.  Soft tissue  laterally is elevated with attention being paid to avoid patellar tendon  on tibial tubercle.  Patella was subluxed laterally, knee flexed 90  degrees, ACL and PCL removed.  Drill was used create a starting hole in  the distal femur and the canal was thoroughly irrigated.  The  5-degree  right valgus alignment guide is placed referencing off the posterior  condyles, rotations marked and a block pinned to remove 10 mm off the  distal femur.  Distal femoral resection is made with an oscillating saw.  Sizing blocks placed, size 4 is most appropriate.  Rotations marked off  the epicondylar axis.  Size 4 cutting block is placed and the anterior,  posterior and chamfer cuts made.   Tibia subluxed forward and menisci are removed.  Extramedullary tibial  alignment guide is placed referencing proximally at the medial aspect of  the tibial tubercle and distally along the second metatarsal axis and  tibial crest.  Block is pinned to remove 10 mm of the non-deficient  lateral side.  Tibial resection is made with an oscillating saw.  Size 5  is the most appropriate tibial component and the proximal tibia with  prepared modular drill and keel punch for the size 5.  Femoral  preparation is completed with the intercondylar cut for the size 4.   Size 5 mobile-bearing tibial trial, size 4 posterior stabilized  femoral  trial, and the 10-mm posterior stabilized rotating platform insert trial  are placed.  With the 10, full extension is achieved with excellent  varus, valgus, anterior and posterior balance throughout full range of  motion.  Patella was then everted, thickness measured to be 25 mm.  Free-  hand resection is taken at about 13 mm, 41 template is placed, lug holes  were drilled, trial patella was placed and it tracks normally.  Osteophytes removed off the posterior femur with the trial in place.  All trials were removed and the cut bone surfaces are prepared with  pulsatile lavage.  Cement is mixed and once ready for implantation, a  size 5 mobile-bearing tibial tray, size 4 posterior stabilized femur,  and 41 patella are cemented into place.  The patella was held with a  clamp.  Trial 10-mm inserts placed, knee held in full extension and all  extruded cement removed.   Once cement is fully hardened then the  permanent 10 mm posterior stabilized rotating platform insert is placed  into the tibial tray.  Wounds copiously irrigated with saline solution  and the FloSeal then injected on the posterior capsule, medial and  lateral gutters and suprapatellar area.  Moist sponges placed and  tourniquet released, for a total tourniquet time of 32 minutes.  Sponges  held for 2 minutes then removed.  Minimal bleeding is encountered.  Bleeding that is encountered stopped with electrocautery.  The wound was  then thoroughly irrigated with saline solution and the arthrotomy closed  with interrupted #1 PDS.  Flexion against gravity is 135 degrees.  Subcu  was then closed with interrupted 2-0 Vicryl and subcuticular running 4-0  Monocryl.  The incisions were cleaned and dried and Steri-Strips and a  bulky sterile dressing applied.  He is then placed into a knee  immobilizer, awakened and transported to recovery in stable condition.      Ollen Gross, M.D.  Electronically Signed     FA/MEDQ  D:  01/22/2008  T:  01/22/2008  Job:  [id number]

## 2010-07-14 NOTE — Op Note (Signed)
NAME:  LEELYND, MALDONADO NO.:  [id number]   MEDICAL RECORD NO.:  [id number]          PATIENT TYPE:  AMB   LOCATION:  DAY                          FACILITY:  [facility]   PHYSICIAN:  Ollen Gross, M.D.    DATE OF BIRTH:  12/04/1949   DATE OF PROCEDURE:  03/11/2008  DATE OF DISCHARGE:                               OPERATIVE REPORT   PREOPERATIVE DIAGNOSIS:  Arthrofibrosis right knee.   POSTOPERATIVE DIAGNOSIS:  Arthrofibrosis right knee.   PROCEDURE:  Right knee closed manipulation.   SURGEON:  Ollen Gross, M.D.   ASSISTANT:  None.   ANESTHESIA:  General.   COMPLICATIONS:  None.   CONDITION:  Stable to recovery.   Pre-manipulation range of motion 10-70, post-manipulation range of  motion 0-115.   BRIEF CLINICAL NOTE:  Timothy [name] is a 60 year old male who had a right  total knee arthroplasty done on January 22, 2008.  He has progressed  slowly with therapy with plateau in range of motion and he is stuck at  about 70 degrees of flexion.  He presents now for closed manipulation.   PROCEDURE IN DETAIL:  After the successful administration of general  anesthetic, exam under anesthesia was performed showing range of motion  10-70.  I then placed my chest on his proximal tibia, flexing the knee  with audible lysis of adhesions.  I was able to get him to 115 degrees.  I was also able to achieve full extension and improve patellar mobility.  He was subsequently awakened and transported to recovery in stable  condition.      Ollen Gross, M.D.  Electronically Signed     FA/MEDQ  D:  03/11/2008  T:  03/11/2008  Job:  [id number]

## 2010-07-17 NOTE — Op Note (Signed)
NAME:  [name], [name] NO.:  [id number]   MEDICAL RECORD NO.:  [id number]          PATIENT TYPE:  AMB   LOCATION:  DAY                          FACILITY:  [facility]   PHYSICIAN:  Ollen Gross, M.D.    DATE OF BIRTH:  10-Nov-1950   DATE OF PROCEDURE:  03/26/2005  DATE OF DISCHARGE:                                 OPERATIVE REPORT   PREOPERATIVE DIAGNOSIS:  Right knee medial meniscal tear.   POSTOPERATIVE DIAGNOSIS:  Right knee medial meniscal tear.   PROCEDURE:  Right knee arthroscopy with meniscal debridement.   SURGEON:  Ollen Gross, M.D.   ASSISTANT:  None.   ANESTHESIA:  General.   ESTIMATED BLOOD LOSS:  Minimal.   DRAINS:  None.   COMPLICATIONS:  None.   CONDITION:  Stable to recovery.   BRIEF CLINICAL NOTE:  Timothy [name] is a 60 year old male who has had significant  right knee pain, mechanical symptoms, for several months now. Exam and  history are consistent with a medial meniscal tear. He presents now for  arthroscopy and debridement.   PROCEDURE IN DETAIL:  After the successful administration of general  anesthetic, a tourniquet is placed high in right thigh.  The right lower  extremity prepped and draped in the usual sterile fashion. A standard  superomedial and inferolateral incision is made and inflow cannula is passed  superomedial, and the camera passed inferolateral. Arthroscopic  visualization proceeds. The undersurface of the patella and trochlea looks  normal. The medial and lateral gutters are visualized and they are normal.  Flexion valgus forces applied to the knee and the medial compartment is  entered.   The medial compartment shows a degenerative tear at the body and posterior  horn of the medial meniscus. There is some chondromalacia on the medial  femoral condyle, but no evidence of any focal chondral defects. He did not  have any exposed bone on the femur, but there was a very small 0.5 x 5 mm  area of exposed bone on the  medial-most margin of the tibia. A spinal needle  was used to localize the inferomedial portal.  A small incision made,  dilator placed in the meniscus, and debrided back to a stable base with  baskets and a 4.2-mm shaver; and then the edges are stabilized with the  ArthroCare device. The rest of the medial compartment looks normal.  Intercondylar notch is visualized, ACL appears normal. Lateral compartments  are normal. Arthroscopic equipment is then removed the  inferior portals which are then closed interrupted 4-0 nylon. Then 20 mL of  1/4% Marcaine with epinephrine are injected through the inflow cannula; and  that is removed, and that portal closed with nylon. A bulky sterile dressing  is applied; and he is awakened and transported to recovery in stable  condition.      Ollen Gross, M.D.  Electronically Signed     FA/MEDQ  D:  03/26/2005  T:  03/26/2005  Job:  [id number]

## 2010-07-17 NOTE — Discharge Summary (Signed)
[name], [name] NO.:  [id number]   MEDICAL RECORD NO.:  [id number]          PATIENT TYPE:  INP   LOCATION:  1604                         FACILITY:  [facility]   PHYSICIAN:  Ollen Gross, M.D.    DATE OF BIRTH:  12/04/1949   DATE OF ADMISSION:  01/22/2008  DATE OF DISCHARGE:  01/25/2008                               DISCHARGE SUMMARY   ADMITTING DIAGNOSIS:  Osteoarthritis, right knee.   DISCHARGE DIAGNOSES:  1. Osteoarthritis, right knee, status post right total knee      replacement arthroplasty.  2. Postop hypotension with vagal episodes, resolved.  3. Acute blood loss anemia, did not require transfusion.   PROCEDURE:  01/22/2008 right total knee.  Surgeon Dr. Lequita Halt.  Assistant Arlyn Leak, P.A.C.  Anesthesia was spinal with Duramorph  added.  Tourniquet time 32 minutes.   CONSULTS:  None.   HISTORY:  Timothy [name] is a 60 year old male who had endstage arthritis in  the right with progressive worsening pain and dysfunction with now  operative management, now presents for total knee arthroplasty.   LABORATORY DATA:  Preop CBC showed hemoglobin 15.0, hematocrit 43.7,  white cell count 4.9, platelets 236.  Postop hemoglobin 10.5, then 9.3.  Last H and H 8.3 and 23.9.  PT/PTT preop 12.5 and 26 respectively.  INR  0.9.  Serial protimes followed per  total knee protocol.  Last PT/INR  18.5 and 1.5.  Chem panel on admission all within normal limits with the  exception of minimally elevated AST of 51.  Serial BMETs were followed  and  electrolytes all remained within normal limits.  Glucose went up  from 107 to 157, came back down a little bit to 150.  Preop UA negative.  Blood type O positive.   Preop EKG 01/17/2008:  Normal sinus rhythm.  Normal EKG confirmed here  on admission.  Followup EKG on 01/22/2008:  Normal sinus rhythm.  Normal  EKG unconfirmed.   HOSPITAL COURSE:  The patient was admitted to [facility].  Tolerated procedure well.  Later  sent to recovery room on orthopedic  floor.  In the early morning hours of postop day 1 patient was  complaining of some pain.  We had been using the IV morphine but became  diaphoretic and bradycardic for a few seconds.  Then became awake and  vomiting.  This was felt to be due to the IV morphine.  He had 2  episodes of this and it sounded like he had some vagal episodes with  some hypotension.  EKG was checked, did not see any acute changes.  The  morphine was discontinued.  Later that morning on rounds the patient was  doing much better.  The PCA was discontinued and he was allowed to have  IV push Dilaudid after being off the morphine.  He was doing a little  bit better and his pressure was back up.  Apparently the pressure had  dropped through that night and his pressure was improved, back up in the  130s.  Encouraged p.o. meds for better control.  Had excellent urinary  output, started getting up out of bed.  He walked 110 feet later that  day so he was doing much better.  Continued to progress well through day  2 and day 3.  Dressings changed on day 2, incision looked good although  the hemoglobin was down to 9.3.  Placed him on some iron.  He was  walking about 130 feet.  By postop day 3 hemoglobin was done a little  bit further to 8.3 but he was asymptomatic with this.  He was tolerating  his iron, doing well with his therapy and discharged home.   DISCHARGE MEDICATIONS/PLAN:  1. Patient discharged home on 01/25/2008.  2. Discharge diagnosis, please see above.  3. Discharge meds:  Percocet, Robaxin, Anular , Coumadin.  4. Diet as tolerated.  5. Activity:  Total knee protocol.  Home physical therapy with home      health nursing, weight bearing as tolerated.  Followup in 2 weeks.   DISPOSITION:  Home.   CONDITION ON DISCHARGE:  Improved.      Alexzandrew L. Perkins, P.A.C.      Ollen Gross, M.D.  Electronically Signed    ALP/MEDQ  D:  03/13/2008  T:  03/13/2008   Job:  [id number]   cc:   Ollen Gross, M.D.  Fax: [phone]   Gordy Savers, MD  [address] [place]  [place] [address]

## 2010-07-17 NOTE — Assessment & Plan Note (Signed)
[facility] OFFICE NOTE   NAME:Timothy [name], Timothy [name]                         MRN:          [id number]  DATE:12/02/2005                            DOB:          1950/12/26    This 60 year old gentleman was seen today for a wellness exam.  He has a  history of renal stones.  He has required bilateral knee surgery, a remote  appendectomy and bilateral hernia repair.   Medical regimen includes a daily aspirin but no other chronic medications.   REVIEW OF SYSTEMS:  Negative.  He did have a colonoscopy in 2004.  He works  out 4-5 times per week and eats healthy.   FAMILY HISTORY:  Father is status post CABG, died at 47.  Also a history of  colon cancer.  An uncle had an MI at 86.  Three sisters all remain healthy.   PHYSICAL EXAMINATION:  GENERAL:  A healthy-appearing, fit male in no acute  distress.  VITAL SIGNS:  Blood pressure was 140/80.  HEENT:  Fundi, ear, nose and throat clear.  NECK:  No bruits or adenopathy.  CHEST:  Clear.  CARDIOVASCULAR:  Normal heart sounds, no murmurs.  ABDOMEN:  Benign.  GENITOURINARY:  External genitalia normal.  EXTREMITIES:  Full posterior tibial pulses.  Dorsalis pedis pulses were  faint.  NEUROLOGIC:  Negative.  RECTAL:  +1-2 prostate enlargement, benign and symmetrical, with heme-  negative stool.   IMPRESSION:  Mild dyslipidemia with favorable HDL but LDL cholesterol of  180.  Unremarkable clinical exam.   DISPOSITION:  He has been asked to track his blood pressure more carefully.  Risks and benefits of statin therapy discussed.  He will continue his active  lifestyle and return here in 1 year.  Report consistently high blood  pressure readings in excess of 130/85.            ______________________________  Gordy Savers, MD    PFK/MedQ  DD:  12/02/2005  DT:  12/03/2005  Job #:  [id number]

## 2010-12-01 LAB — URINALYSIS, ROUTINE W REFLEX MICROSCOPIC
Bilirubin Urine: NEGATIVE
Glucose, UA: NEGATIVE
Hgb urine dipstick: NEGATIVE
Ketones, ur: NEGATIVE
Nitrite: NEGATIVE
Protein, ur: NEGATIVE
Specific Gravity, Urine: 1.019
Urobilinogen, UA: 1
pH: 7

## 2010-12-01 LAB — BASIC METABOLIC PANEL
BUN: 4 — ABNORMAL LOW
BUN: 7
CO2: 29
CO2: 29
Calcium: 8.6
Calcium: 8.9
Chloride: 102
Chloride: 104
Creatinine, Ser: 0.86
Creatinine, Ser: 1.02
GFR calc Af Amer: >60
GFR calc Af Amer: >60
GFR calc non Af Amer: >60
GFR calc non Af Amer: >60
Glucose, Bld: 150 — ABNORMAL HIGH
Glucose, Bld: 157 — ABNORMAL HIGH
Potassium: 3.6
Potassium: 4.3
Sodium: 137
Sodium: 137

## 2010-12-01 LAB — TYPE AND SCREEN
ABO/RH(D): O POS
Antibody Screen: NEGATIVE

## 2010-12-01 LAB — CBC
HCT: 23.9 — ABNORMAL LOW
HCT: 30.2 — ABNORMAL LOW
HCT: 43.7
Hemoglobin: 10.5 — ABNORMAL LOW
Hemoglobin: 15
Hemoglobin: 8.3 — ABNORMAL LOW
MCHC: 34.3
MCHC: 34.5
MCHC: 34.9
MCHC: 34.9
MCV: 92.3
MCV: 92.8
MCV: 93
Platelets: 146 — ABNORMAL LOW
Platelets: 174
Platelets: 236
RBC: 2.89 — ABNORMAL LOW
RBC: 3.25 — ABNORMAL LOW
RBC: 4.73
RDW: 12
RDW: 12
RDW: 12.1
RDW: 12.3
WBC: 4.9
WBC: 7.9

## 2010-12-01 LAB — APTT: aPTT: 26

## 2010-12-01 LAB — PROTIME-INR
INR: 0.9
INR: 1
INR: 1.2
INR: 1.5
Prothrombin Time: 12.5
Prothrombin Time: 13.7
Prothrombin Time: 15.3 — ABNORMAL HIGH

## 2010-12-01 LAB — COMPREHENSIVE METABOLIC PANEL
ALT: 43
AST: 51 — ABNORMAL HIGH
Albumin: 4
Alkaline Phosphatase: 49
BUN: 14
CO2: 24
Calcium: 9.9
Chloride: 105
Creatinine, Ser: 0.9
GFR calc Af Amer: >60
GFR calc non Af Amer: >60
Glucose, Bld: 107 — ABNORMAL HIGH
Potassium: 4.1
Sodium: 139
Total Bilirubin: 1.1
Total Protein: 7.1

## 2010-12-01 LAB — ABO/RH: ABO/RH(D): O POS

## 2010-12-01 LAB — GLUCOSE, CAPILLARY: Glucose-Capillary: 165 — ABNORMAL HIGH

## 2011-01-04 ENCOUNTER — Ambulatory Visit (INDEPENDENT_AMBULATORY_CARE_PROVIDER_SITE_OTHER): Payer: BLUE CROSS/BLUE SHIELD

## 2011-01-04 DIAGNOSIS — Z23 Encounter for immunization: Secondary | ICD-10-CM

## 2011-01-04 DIAGNOSIS — Z0000 Encounter for general adult medical examination without abnormal findings: Secondary | ICD-10-CM

## 2011-04-05 ENCOUNTER — Encounter: Payer: BLUE CROSS/BLUE SHIELD | Admitting: Internal Medicine

## 2011-04-05 ENCOUNTER — Other Ambulatory Visit (INDEPENDENT_AMBULATORY_CARE_PROVIDER_SITE_OTHER): Payer: BLUE CROSS/BLUE SHIELD

## 2011-04-05 DIAGNOSIS — Z0000 Encounter for general adult medical examination without abnormal findings: Secondary | ICD-10-CM

## 2011-04-05 LAB — POCT URINALYSIS DIPSTICK
Nitrite, UA: NEGATIVE
Protein, UA: NEGATIVE
Spec Grav, UA: 1.02
Urobilinogen, UA: 0.2

## 2011-04-05 LAB — CBC WITH DIFFERENTIAL/PLATELET
Basophils Absolute: 0 10*3/uL (ref 0.0–0.1)
Eosinophils Absolute: 0.2 10*3/uL (ref 0.0–0.7)
Lymphocytes Relative: 32.9 % (ref 12.0–46.0)
MCHC: 35 g/dL (ref 30.0–36.0)
Monocytes Relative: 9.8 % (ref 3.0–12.0)
Neutrophils Relative %: 53.5 % (ref 43.0–77.0)
RDW: 12.8 % (ref 11.5–14.6)

## 2011-04-05 LAB — BASIC METABOLIC PANEL
CO2: 27 mEq/L (ref 19–32)
Calcium: 9.2 mg/dL (ref 8.4–10.5)
Creatinine, Ser: 0.9 mg/dL (ref 0.4–1.5)
GFR: 92.26 mL/min (ref >60.00)
Glucose, Bld: 105 mg/dL — ABNORMAL HIGH (ref 70–99)

## 2011-04-05 LAB — LIPID PANEL
HDL: 47.4 mg/dL (ref >39.00)
Total CHOL/HDL Ratio: 6
VLDL: 219.6 mg/dL — ABNORMAL HIGH (ref 0.0–40.0)

## 2011-04-05 LAB — PSA: PSA: 1.89 ng/mL (ref 0.10–4.00)

## 2011-04-05 LAB — HEPATIC FUNCTION PANEL
Alkaline Phosphatase: 45 U/L (ref 39–117)
Bilirubin, Direct: 0 mg/dL (ref 0.0–0.3)
Total Bilirubin: 0.2 mg/dL — ABNORMAL LOW (ref 0.3–1.2)

## 2011-04-09 ENCOUNTER — Encounter: Payer: BLUE CROSS/BLUE SHIELD | Admitting: Internal Medicine

## 2011-04-15 ENCOUNTER — Encounter: Payer: Self-pay | Admitting: Internal Medicine

## 2011-04-16 ENCOUNTER — Ambulatory Visit (INDEPENDENT_AMBULATORY_CARE_PROVIDER_SITE_OTHER): Payer: No Typology Code available for payment source | Admitting: Internal Medicine

## 2011-04-16 ENCOUNTER — Encounter: Payer: Self-pay | Admitting: Internal Medicine

## 2011-04-16 VITALS — BP 170/100 | HR 80 | Temp 98.6°F | Resp 20 | Ht 70.0 in | Wt 165.0 lb

## 2011-04-16 DIAGNOSIS — Z0000 Encounter for general adult medical examination without abnormal findings: Secondary | ICD-10-CM

## 2011-04-16 DIAGNOSIS — E785 Hyperlipidemia, unspecified: Secondary | ICD-10-CM

## 2011-04-16 MED ORDER — OMEGA-3-ACID ETHYL ESTERS 1 G PO CAPS: 2.0000 g | ORAL_CAPSULE | Freq: Two times a day (BID) | ORAL | Status: DC

## 2011-04-16 MED ORDER — NIACIN ER (ANTIHYPERLIPIDEMIC) 1000 MG PO TBCR: 1000.0000 mg | EXTENDED_RELEASE_TABLET | Freq: Every day | ORAL | Status: DC

## 2011-04-16 MED ORDER — SILDENAFIL CITRATE 100 MG PO TABS: 100.0000 mg | ORAL_TABLET | Freq: Every day | ORAL | Status: DC | PRN

## 2011-04-16 NOTE — Progress Notes (Signed)
Subjective:    Patient ID: Timothy [name], male    DOB: 12/04/1949, 61 y.o.   MRN: [id number]  HPI  61 year old patient who is seen today for a wellness exam. He has a history of dyslipidemia osteoarthritis status post right total knee replacement surgery. He has a history of colonic polyps and is scheduled for followup colonoscopy in approximately one year there is a history of nephrolithiasis. He is doing quite well and maintains a prudent diet and his exercise level has fallen off considerably.    Past Medical History  Diagnosis Date  . COLONIC POLYPS, HX OF 02/12/2009  . HYPERLIPIDEMIA 11/21/2007  . 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 OSTEOARTHRITIS 01/17/2007    History   Social History  . Marital Status: Married    Spouse Name: N/A    Number of Children: N/A  . Years of Education: N/A   Occupational History  . Not on file.   Social History Main Topics  . Smoking status: Never Smoker   . Smokeless tobacco: Current User    Types: Chew  . Alcohol Use: Yes  . Drug Use: No  . Sexually Active: Not on file   Other Topics Concern  . Not on file   Social History Narrative  . No narrative on file    Past Surgical History  Procedure Date  . Appendectomy   . Hernia repair     ingunial  . Knee arthroscopy     bilateral  . Total knee arthroplasty     right  . Carpal tunnel release   . Shoulder surgery     left    Family History  Problem Relation Age of Onset  . Cancer Father     colon ca    No Known Allergies  Current Outpatient Prescriptions on File Prior to Visit  Medication Sig Dispense Refill  . sildenafil (VIAGRA) 100 MG tablet Take 100 mg by mouth daily as needed.          BP 170/100  Pulse 80  Temp(Src) 98.6 F (37 C) (Oral)  Resp 20  Ht 5\' 10"  (1.778 m)  Wt 165 lb (74.844 kg)  BMI 23.68 kg/m2  SpO2 99%       Review of Systems  Constitutional: Negative for fever, chills, activity change, appetite change and fatigue.  HENT:  Negative for hearing loss, ear pain, congestion, rhinorrhea, sneezing, mouth sores, trouble swallowing, neck pain, neck stiffness, dental problem, voice change, sinus pressure and tinnitus.   Eyes: Negative for photophobia, pain, redness and visual disturbance.  Respiratory: Negative for apnea, cough, choking, chest tightness, shortness of breath and wheezing.   Cardiovascular: Negative for chest pain, palpitations and leg swelling.  Gastrointestinal: Negative for nausea, vomiting, abdominal pain, diarrhea, constipation, blood in stool, abdominal distention, anal bleeding and rectal pain.  Genitourinary: Negative for dysuria, urgency, frequency, hematuria, flank pain, decreased urine volume, discharge, penile swelling, scrotal swelling, difficulty urinating, genital sores and testicular pain.  Musculoskeletal: Negative for myalgias, back pain, joint swelling, arthralgias and gait problem.  Skin: Negative for color change, rash and wound.  Neurological: Negative for dizziness, tremors, seizures, syncope, facial asymmetry, speech difficulty, weakness, light-headedness, numbness and headaches.  Hematological: Negative for adenopathy. Does not bruise/bleed easily.  Psychiatric/Behavioral: Negative for suicidal ideas, hallucinations, behavioral problems, confusion, sleep disturbance, self-injury, dysphoric mood, decreased concentration and agitation. The patient is not nervous/anxious.        Objective:   Physical Exam  Constitutional: He appears well-developed and well-nourished.  HENT:  Head: Normocephalic and atraumatic.  Right Ear: External ear normal.  Left Ear: External ear normal.  Nose: Nose normal.  Mouth/Throat: Oropharynx is clear and moist.  Eyes: Conjunctivae and EOM are normal. Pupils are equal, round, and reactive to light. No scleral icterus.  Neck: Normal range of motion. Neck supple. No JVD present. No thyromegaly present.  Cardiovascular: Regular rhythm, normal heart sounds and  intact distal pulses.  Exam reveals no gallop and no friction rub.   No murmur heard. Pulmonary/Chest: Effort normal and breath sounds normal. He exhibits no tenderness.  Abdominal: Soft. Bowel sounds are normal. He exhibits no distension and no mass. There is no tenderness.  Genitourinary: Prostate normal and penis normal.  Musculoskeletal: Normal range of motion. He exhibits no edema and no tenderness.       Status post right total replacement  Lymphadenopathy:    He has no cervical adenopathy.  Neurological: He is alert. He has normal reflexes. No cranial nerve deficit. Coordination normal.  Skin: Skin is warm and dry. No rash noted.  Psychiatric: He has a normal mood and affect. His behavior is normal.          Assessment & Plan:  Preventive health examination  Dyslipidemia. Will start on niacin and omega-3 fatty acids area and has asked to increase his exercise regimen. Followup lipid profile in 3 months  Home blood pressure monitor and encouraged

## 2011-04-16 NOTE — Patient Instructions (Signed)
Niacin  250 mg at bedtime  titrate slowly to a final dose of 2000 mg if tolerated. Take aspirin one hour prior to bedtime  Omega-3 fatty acids    It is important that you exercise regularly, at least 20 minutes 3 to 4 times per week.  If you develop chest pain or shortness of breath seek  medical attention.  Please check your blood pressure on a regular basis.  If it is consistently greater than 150/90, please make an office appointment.  Return in 3 months for follow-up

## 2011-06-16 ENCOUNTER — Other Ambulatory Visit: Payer: Self-pay | Admitting: Internal Medicine

## 2011-07-09 ENCOUNTER — Other Ambulatory Visit (INDEPENDENT_AMBULATORY_CARE_PROVIDER_SITE_OTHER): Payer: No Typology Code available for payment source

## 2011-07-09 DIAGNOSIS — E785 Hyperlipidemia, unspecified: Secondary | ICD-10-CM

## 2011-07-09 LAB — LIPID PANEL
Total CHOL/HDL Ratio: 4
Triglycerides: 275 mg/dL — ABNORMAL HIGH (ref 0.0–149.0)

## 2011-07-09 LAB — LDL CHOLESTEROL, DIRECT: Direct LDL: 141.8 mg/dL

## 2011-07-15 ENCOUNTER — Ambulatory Visit (INDEPENDENT_AMBULATORY_CARE_PROVIDER_SITE_OTHER): Payer: No Typology Code available for payment source | Admitting: Internal Medicine

## 2011-07-15 ENCOUNTER — Encounter: Payer: Self-pay | Admitting: Internal Medicine

## 2011-07-15 VITALS — BP 138/90 | Temp 98.1°F | Wt 166.0 lb

## 2011-07-15 DIAGNOSIS — E785 Hyperlipidemia, unspecified: Secondary | ICD-10-CM

## 2011-07-15 DIAGNOSIS — R030 Elevated blood-pressure reading, without diagnosis of hypertension: Secondary | ICD-10-CM

## 2011-07-15 NOTE — Patient Instructions (Addendum)
Limit your sodium (Salt) intake    It is important that you exercise regularly, at least 20 minutes 3 to 4 times per week.  If you develop chest pain or shortness of breath seek  medical attention.  Return in one year for follow-up  Please check your blood pressure on a regular basis.  If it is consistently greater than 150/90, please make an office appointment.  Hypertriglyceridemia   Diet for High blood levels of Triglycerides Most fats in food are triglycerides. Triglycerides in your blood are stored as fat in your body. High levels of triglycerides in your blood may put you at a greater risk for heart disease and stroke.   Normal triglyceride levels are less than 150 mg/dL. Borderline high levels are 150-199 mg/dl. High levels are 200 - 499 mg/dL, and very high triglyceride levels are greater than 500 mg/dL. The decision to treat high triglycerides is generally based on the level. For people with borderline or high triglyceride levels, treatment includes weight loss and exercise. Drugs are recommended for people with very high triglyceride levels. Many people who need treatment for high triglyceride levels have metabolic syndrome. This syndrome is a collection of disorders that often include: insulin resistance, high blood pressure, blood clotting problems, high cholesterol and triglycerides. TESTING PROCEDURE FOR TRIGLYCERIDES  You should not eat 4 hours before getting your triglycerides measured. The normal range of triglycerides is between 10 and 250 milligrams per deciliter (mg/dl). Some people may have extreme levels (1000 or above), but your triglyceride level may be too high if it is above 150 mg/dl, depending on what other risk factors you have for heart disease.   People with high blood triglycerides may also have high blood cholesterol levels. If you have high blood cholesterol as well as high blood triglycerides, your risk for heart disease is probably greater than if you only had  high triglycerides. High blood cholesterol is one of the main risk factors for heart disease.  CHANGING YOUR DIET   Your weight can affect your blood triglyceride level. If you are more than 20% above your ideal body weight, you may be able to lower your blood triglycerides by losing weight. Eating less and exercising regularly is the best way to combat this. Fat provides more calories than any other food. The best way to lose weight is to eat less fat. Only 30% of your total calories should come from fat. Less than 7% of your diet should come from saturated fat. A diet low in fat and saturated fat is the same as a diet to decrease blood cholesterol. By eating a diet lower in fat, you may lose weight, lower your blood cholesterol, and lower your blood triglyceride level.   Eating a diet low in fat, especially saturated fat, may also help you lower your blood triglyceride level. Ask your dietitian to help you figure how much fat you can eat based on the number of calories your caregiver has prescribed for you.   Exercise, in addition to helping with weight loss may also help lower triglyceride levels.   Alcohol can increase blood triglycerides. You may need to stop drinking alcoholic beverages.   Too much carbohydrate in your diet may also increase your blood triglycerides. Some complex carbohydrates are necessary in your diet. These may include bread, rice, potatoes, other starchy vegetables and cereals.   Reduce "simple" carbohydrates. These may include pure sugars, candy, honey, and jelly without losing other nutrients. If you have the kind of high  blood triglycerides that is affected by the amount of carbohydrates in your diet, you will need to eat less sugar and less high-sugar foods. Your caregiver can help you with this.   Adding 2-4 grams of fish oil (EPA+ DHA) may also help lower triglycerides. Speak with your caregiver before adding any supplements to your regimen.  Following the Diet    Maintain your ideal weight. Your caregivers can help you with a diet. Generally, eating less food and getting more exercise will help you lose weight. Joining a weight control group may also help. Ask your caregivers for a good weight control group in your area.   Eat low-fat foods instead of high-fat foods. This can help you lose weight too.   These foods are lower in fat. Eat MORE of these:   Dried beans, peas, and lentils.   Egg whites.   Low-fat cottage cheese.   Fish.   Lean cuts of meat, such as round, sirloin, rump, and flank (cut extra fat off meat you fix).   Whole grain breads, cereals and pasta.   Skim and nonfat dry milk.   Low-fat yogurt.   Poultry without the skin.   Cheese made with skim or part-skim milk, such as mozzarella, parmesan, farmers', ricotta, or pot cheese.  These are higher fat foods. Eat LESS of these:    Whole milk and foods made from whole milk, such as American, blue, cheddar, monterey jack, and swiss cheese   High-fat meats, such as luncheon meats, sausages, knockwurst, bratwurst, hot dogs, ribs, corned beef, ground pork, and regular ground beef.   Fried foods.  Limit saturated fats in your diet. Substituting unsaturated fat for saturated fat may decrease your blood triglyceride level. You will need to read package labels to know which products contain saturated fats.   These foods are high in saturated fat. Eat LESS of these:   Fried pork skins.   Whole milk.   Skin and fat from poultry.   Palm oil.   Butter.   Shortening.   Cream cheese.   Tomasa Blase.   Margarines and baked goods made from listed oils.   Vegetable shortenings.   Chitterlings.   Fat from meats.   Coconut oil.   Palm kernel oil.   Lard.   Cream.   Sour cream.   Fatback.   Coffee whiteners and non-dairy creamers made with these oils.   Cheese made from whole milk.  Use unsaturated fats (both polyunsaturated and monounsaturated) moderately. Remember,  even though unsaturated fats are better than saturated fats; you still want a diet low in total fat.   These foods are high in unsaturated fat:   Canola oil.   Sunflower oil.   Mayonnaise.   Almonds.   Peanuts.   Pine nuts.   Margarines made with these oils.   Safflower oil.   Olive oil.   Avocados.   Cashews.   Peanut butter.   Sunflower seeds.   Soybean oil.   Peanut oil.   Olives.   Pecans.   Walnuts.   Pumpkin seeds.  Avoid sugar and other high-sugar foods. This will decrease carbohydrates without decreasing other nutrients. Sugar in your food goes rapidly to your blood. When there is excess sugar in your blood, your liver may use it to make more triglycerides. Sugar also contains calories without other important nutrients.   Eat LESS of these:   Sugar, brown sugar, powdered sugar, jam, jelly, preserves, honey, syrup, molasses, pies, candy, cakes, cookies, frosting, pastries,  colas, soft drinks, punches, fruit drinks, and regular gelatin.   Avoid alcohol. Alcohol, even more than sugar, may increase blood triglycerides. In addition, alcohol is high in calories and low in nutrients. Ask for sparkling water, or a diet soft drink instead of an alcoholic beverage.  Suggestions for planning and preparing meals   Bake, broil, grill or roast meats instead of frying.   Remove fat from meats and skin from poultry before cooking.   Add spices, herbs, lemon juice or vinegar to vegetables instead of salt, rich sauces or gravies.   Use a non-stick skillet without fat or use no-stick sprays.   Cool and refrigerate stews and broth. Then remove the hardened fat floating on the surface before serving.   Refrigerate meat drippings and skim off fat to make low-fat gravies.   Serve more fish.   Use less butter, margarine and other high-fat spreads on bread or vegetables.   Use skim or reconstituted non-fat dry milk for cooking.   Cook with low-fat cheeses.    Substitute low-fat yogurt or cottage cheese for all or part of the sour cream in recipes for sauces, dips or congealed salads.   Use half yogurt/half mayonnaise in salad recipes.   Substitute evaporated skim milk for cream. Evaporated skim milk or reconstituted non-fat dry milk can be whipped and substituted for whipped cream in certain recipes.   Choose fresh fruits for dessert instead of high-fat foods such as pies or cakes. Fruits are naturally low in fat.  When Dining Out   Order low-fat appetizers such as fruit or vegetable juice, pasta with vegetables or tomato sauce.   Select clear, rather than cream soups.   Ask that dressings and gravies be served on the side. Then use less of them.   Order foods that are baked, broiled, poached, steamed, stir-fried, or roasted.   Ask for margarine instead of butter, and use only a small amount.   Drink sparkling water, unsweetened tea or coffee, or diet soft drinks instead of alcohol or other sweet beverages.  QUESTIONS AND ANSWERS ABOUT OTHER FATS IN THE BLOOD: SATURATED FAT, TRANS FAT, AND CHOLESTEROL What is trans fat? Trans fat is a type of fat that is formed when vegetable oil is hardened through a process called hydrogenation. This process helps makes foods more solid, gives them shape, and prolongs their shelf life. Trans fats are also called hydrogenated or partially hydrogenated oils.   What do saturated fat, trans fat, and cholesterol in foods have to do with heart disease? Saturated fat, trans fat, and cholesterol in the diet all raise the level of LDL "bad" cholesterol in the blood. The higher the LDL cholesterol, the greater the risk for coronary heart disease (CHD). Saturated fat and trans fat raise LDL similarly.   What foods contain saturated fat, trans fat, and cholesterol? High amounts of saturated fat are found in animal products, such as fatty cuts of meat, chicken skin, and full-fat dairy products like butter, whole milk,  cream, and cheese, and in tropical vegetable oils such as palm, palm kernel, and coconut oil. Trans fat is found in some of the same foods as saturated fat, such as vegetable shortening, some margarines (especially hard or stick margarine), crackers, cookies, baked goods, fried foods, salad dressings, and other processed foods made with partially hydrogenated vegetable oils. Small amounts of trans fat also occur naturally in some animal products, such as milk products, beef, and lamb. Foods high in cholesterol include liver, other organ meats,  egg yolks, shrimp, and full-fat dairy products. How can I use the new food label to make heart-healthy food choices? Check the Nutrition Facts panel of the food label. Choose foods lower in saturated fat, trans fat, and cholesterol. For saturated fat and cholesterol, you can also use the Percent Daily Value (%DV): 5% DV or less is low, and 20% DV or more is high. (There is no %DV for trans fat.) Use the Nutrition Facts panel to choose foods low in saturated fat and cholesterol, and if the trans fat is not listed, read the ingredients and limit products that list shortening or hydrogenated or partially hydrogenated vegetable oil, which tend to be high in trans fat. POINTS TO REMEMBER: YOU NEED A LITTLE TLC (THERAPEUTIC LIFESTYLE CHANGES)  Discuss your risk for heart disease with your caregivers, and take steps to reduce risk factors.   Change your diet. Choose foods that are low in saturated fat, trans fat, and cholesterol.   Add exercise to your daily routine if it is not already being done. Participate in physical activity of moderate intensity, like brisk walking, for at least 30 minutes on most, and preferably all days of the week. No time? Break the 30 minutes into three, 10-minute segments during the day.   Stop smoking. If you do smoke, contact your caregiver to discuss ways in which they can help you quit.   Do not use street drugs.   Maintain a normal  weight.   Maintain a healthy blood pressure.   Keep up with your blood work for checking the fats in your blood as directed by your caregiver.  Document Released: 12/04/2003 Document Revised: 02/04/2011 Document Reviewed: 07/01/2008 [facility] Patient Information 2012 [place], [place].

## 2011-07-15 NOTE — Progress Notes (Signed)
  Subjective:    Patient ID: Timothy [name], male    DOB: 12/04/1949, 61 y.o.   MRN: [id number]  HPI  61 year old patient who is seen today for followup of his dyslipidemia. He has significant hypertriglyceridemia with a total triglyceride level in excess of 1000. He is now on omega-3 fatty acid as well as 2 g of niacin daily which he tolerates well. Repeat lipid profile was reviewed and much improved. He also is a hypertensive suspect and does monitor home blood pressure readings. Remains very active with exercise program to do 2 arthritis of the knees has not been quite as active with aerobic activities. He plans on resuming swimming shortly home blood pressure readings have generally been normal    Review of Systems  Constitutional: Negative for fever, chills, appetite change and fatigue.  HENT: Negative for hearing loss, ear pain, congestion, sore throat, trouble swallowing, neck stiffness, dental problem, voice change and tinnitus.   Eyes: Negative for pain, discharge and visual disturbance.  Respiratory: Negative for cough, chest tightness, wheezing and stridor.   Cardiovascular: Negative for chest pain, palpitations and leg swelling.  Gastrointestinal: Negative for nausea, vomiting, abdominal pain, diarrhea, constipation, blood in stool and abdominal distention.  Genitourinary: Negative for urgency, hematuria, flank pain, discharge, difficulty urinating and genital sores.  Musculoskeletal: Negative for myalgias, back pain, joint swelling, arthralgias and gait problem.  Skin: Negative for rash.  Neurological: Negative for dizziness, syncope, speech difficulty, weakness, numbness and headaches.  Hematological: Negative for adenopathy. Does not bruise/bleed easily.  Psychiatric/Behavioral: Negative for behavioral problems and dysphoric mood. The patient is not nervous/anxious.        Objective:   Physical Exam  Constitutional: He appears well-developed and well-nourished. No  distress.       Blood pressure 160/84          Assessment & Plan:   Dyslipidemia. He improved. Will continue present pharmacologic regimen more rigorous aerobic activities encouraged Hypertension we'll maintain a low-salt diet continue home blood pressure monitoring will call her blood pressures consistently in excess of 140/90

## 2012-06-23 ENCOUNTER — Other Ambulatory Visit: Payer: Self-pay | Admitting: Internal Medicine

## 2012-11-03 ENCOUNTER — Other Ambulatory Visit (INDEPENDENT_AMBULATORY_CARE_PROVIDER_SITE_OTHER): Payer: No Typology Code available for payment source

## 2012-11-03 DIAGNOSIS — Z0000 Encounter for general adult medical examination without abnormal findings: Secondary | ICD-10-CM

## 2012-11-03 DIAGNOSIS — E785 Hyperlipidemia, unspecified: Secondary | ICD-10-CM

## 2012-11-03 LAB — HEPATIC FUNCTION PANEL
ALT: 34 U/L (ref 0–53)
AST: 42 U/L — ABNORMAL HIGH (ref 0–37)
Alkaline Phosphatase: 39 U/L (ref 39–117)
Bilirubin, Direct: 0.1 mg/dL (ref 0.0–0.3)
Total Protein: 7.1 g/dL (ref 6.0–8.3)

## 2012-11-03 LAB — CBC WITH DIFFERENTIAL/PLATELET
Basophils Relative: 0.5 % (ref 0.0–3.0)
Eosinophils Relative: 2.4 % (ref 0.0–5.0)
Lymphocytes Relative: 31.5 % (ref 12.0–46.0)
MCV: 92.9 fl (ref 78.0–100.0)
Neutrophils Relative %: 54.2 % (ref 43.0–77.0)
RBC: 4.56 Mil/uL (ref 4.22–5.81)
WBC: 5.6 10*3/uL (ref 4.5–10.5)

## 2012-11-03 LAB — POCT URINALYSIS DIPSTICK
Bilirubin, UA: NEGATIVE
Blood, UA: NEGATIVE
Ketones, UA: NEGATIVE
pH, UA: 6

## 2012-11-03 LAB — BASIC METABOLIC PANEL
Calcium: 9.5 mg/dL (ref 8.4–10.5)
Chloride: 103 mEq/L (ref 96–112)
Creatinine, Ser: 0.8 mg/dL (ref 0.4–1.5)

## 2012-11-03 LAB — LIPID PANEL
HDL: 78.6 mg/dL (ref >39.00)
VLDL: 52.6 mg/dL — ABNORMAL HIGH (ref 0.0–40.0)

## 2012-11-03 LAB — LDL CHOLESTEROL, DIRECT: Direct LDL: 146.3 mg/dL

## 2012-11-14 ENCOUNTER — Ambulatory Visit (INDEPENDENT_AMBULATORY_CARE_PROVIDER_SITE_OTHER): Payer: No Typology Code available for payment source | Admitting: Internal Medicine

## 2012-11-14 ENCOUNTER — Encounter: Payer: Self-pay | Admitting: Internal Medicine

## 2012-11-14 VITALS — BP 150/90 | HR 74 | Temp 98.3°F | Resp 20 | Ht 69.5 in | Wt 168.0 lb

## 2012-11-14 DIAGNOSIS — Z23 Encounter for immunization: Secondary | ICD-10-CM

## 2012-11-14 DIAGNOSIS — E785 Hyperlipidemia, unspecified: Secondary | ICD-10-CM

## 2012-11-14 DIAGNOSIS — R030 Elevated blood-pressure reading, without diagnosis of hypertension: Secondary | ICD-10-CM

## 2012-11-14 DIAGNOSIS — Z0000 Encounter for general adult medical examination without abnormal findings: Secondary | ICD-10-CM

## 2012-11-14 MED ORDER — ATORVASTATIN CALCIUM 20 MG PO TABS: 20.0000 mg | ORAL_TABLET | Freq: Every day | ORAL | Status: DC

## 2012-11-14 NOTE — Progress Notes (Signed)
Patient ID: Timothy [name], male   DOB: 12/04/1949, 62 y.o.   MRN: [id number]  Subjective:    Patient ID: Timothy [name], male    DOB: 12/04/1949, 62 y.o.   MRN: [id number]  HPI  62 -year-old patient who is seen today for a wellness exam. He has a history of dyslipidemia osteoarthritis status post right total knee replacement surgery. He has a history of colonic polyps and is scheduled for followup colonoscopy.  there is a history of nephrolithiasis. He is doing quite well and maintains on a  prudent diet;  he exercises regularly  4 times per week  Past Medical History  Diagnosis Date  . COLONIC POLYPS, HX OF 02/12/2009  . HYPERLIPIDEMIA 11/21/2007  . 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 OSTEOARTHRITIS 01/17/2007    History   Social History  . Marital Status: Married    Spouse Name: N/A    Number of Children: N/A  . Years of Education: N/A   Occupational History  . Not on file.   Social History Main Topics  . Smoking status: Never Smoker   . Smokeless tobacco: Current User    Types: Chew  . Alcohol Use: Yes  . Drug Use: No  . Sexual Activity: Not on file   Other Topics Concern  . Not on file   Social History Narrative  . No narrative on file    Past Surgical History  Procedure Laterality Date  . Appendectomy    . Hernia repair      ingunial  . Knee arthroscopy      bilateral  . Total knee arthroplasty      right  . Carpal tunnel release    . Shoulder surgery      left    Family History  Problem Relation Age of Onset  . Cancer Father     colon ca    No Known Allergies  Current Outpatient Prescriptions on File Prior to Visit  Medication Sig Dispense Refill  . Multiple Vitamin (MULTIVITAMIN) tablet Take 1 tablet by mouth daily.      Marland Kitchen VIAGRA 100 MG tablet USE AS DIRECTED  6 tablet  1  . niacin (NIASPAN) 1000 MG CR tablet Take 1 tablet (1,000 mg total) by mouth at bedtime.      Marland Kitchen omega-3 acid ethyl esters (LOVAZA) 1 G capsule Take 2 capsules (2  g total) by mouth 2 (two) times daily.       No current facility-administered medications on file prior to visit.    BP 150/90  Pulse 74  Temp(Src) 98.3 F (36.8 C) (Oral)  Resp 20  Ht 5' 9.5" (1.765 m)  Wt 168 lb (76.204 kg)  BMI 24.46 kg/m2  SpO2 99%       Review of Systems  Constitutional: Negative for fever, chills, activity change, appetite change and fatigue.  HENT: Negative for hearing loss, ear pain, congestion, rhinorrhea, sneezing, mouth sores, trouble swallowing, neck pain, neck stiffness, dental problem, voice change, sinus pressure and tinnitus.   Eyes: Negative for photophobia, pain, redness and visual disturbance.  Respiratory: Negative for apnea, cough, choking, chest tightness, shortness of breath and wheezing.   Cardiovascular: Negative for chest pain, palpitations and leg swelling.  Gastrointestinal: Negative for nausea, vomiting, abdominal pain, diarrhea, constipation, blood in stool, abdominal distention, anal bleeding and rectal pain.  Genitourinary: Negative for dysuria, urgency, frequency, hematuria, flank pain, decreased urine volume, discharge, penile swelling, scrotal swelling, difficulty urinating, genital sores and testicular pain.  Musculoskeletal:  Negative for myalgias, back pain, joint swelling, arthralgias and gait problem.  Skin: Negative for color change, rash and wound.  Neurological: Negative for dizziness, tremors, seizures, syncope, facial asymmetry, speech difficulty, weakness, light-headedness, numbness and headaches.  Hematological: Negative for adenopathy. Does not bruise/bleed easily.  Psychiatric/Behavioral: Negative for suicidal ideas, hallucinations, behavioral problems, confusion, sleep disturbance, self-injury, dysphoric mood, decreased concentration and agitation. The patient is not nervous/anxious.        Objective:   Physical Exam  Constitutional: He appears well-developed and well-nourished.  HENT:  Head: Normocephalic and  atraumatic.  Right Ear: External ear normal.  Left Ear: External ear normal.  Nose: Nose normal.  Mouth/Throat: Oropharynx is clear and moist.  Eyes: Conjunctivae and EOM are normal. Pupils are equal, round, and reactive to light. No scleral icterus.  Neck: Normal range of motion. Neck supple. No JVD present. No thyromegaly present.  Cardiovascular: Regular rhythm, normal heart sounds and intact distal pulses.  Exam reveals no gallop and no friction rub.   No murmur heard. Pulmonary/Chest: Effort normal and breath sounds normal. He exhibits no tenderness.  Abdominal: Soft. Bowel sounds are normal. He exhibits no distension and no mass. There is no tenderness.  Genitourinary: Prostate normal and penis normal.  Musculoskeletal: Normal range of motion. He exhibits no edema and no tenderness.  Status post right total replacement  Lymphadenopathy:    He has no cervical adenopathy.  Neurological: He is alert. He has normal reflexes. No cranial nerve deficit. Coordination normal.  Skin: Skin is warm and dry. No rash noted.  Psychiatric: He has a normal mood and affect. His behavior is normal.          Assessment & Plan:  Preventive health examination  Ten-year CVD risk  calculated at 10.3%  will start on atorvastatin 20  Needs followup colonoscopy

## 2012-11-14 NOTE — Patient Instructions (Signed)
It is important that you exercise regularly, at least 20 minutes 3 to 4 times per week.  If you develop chest pain or shortness of breath seek  medical attention.  Limit your sodium (Salt) intake  Return in one year for follow-up  Schedule your colonoscopy to help detect colon cancer. 

## 2012-11-20 ENCOUNTER — Encounter: Payer: Self-pay | Admitting: Internal Medicine

## 2012-11-24 ENCOUNTER — Encounter: Payer: Self-pay | Admitting: Internal Medicine

## 2013-01-11 ENCOUNTER — Ambulatory Visit (AMBULATORY_SURGERY_CENTER): Payer: Self-pay | Admitting: *Deleted

## 2013-01-11 VITALS — Ht 70.0 in | Wt 164.0 lb

## 2013-01-11 DIAGNOSIS — Z800 Family history of malignant neoplasm of digestive organs: Secondary | ICD-10-CM

## 2013-01-11 DIAGNOSIS — Z860100 Personal history of colon polyps, unspecified: Secondary | ICD-10-CM

## 2013-01-11 MED ORDER — MOVIPREP 100 G PO SOLR: 1.0000 | Freq: Once | ORAL | Status: DC

## 2013-01-11 NOTE — Progress Notes (Signed)
Denies allergies to eggs or soy products. Denies complications of sedation or general anesthesia.

## 2013-02-01 ENCOUNTER — Ambulatory Visit (AMBULATORY_SURGERY_CENTER): Payer: No Typology Code available for payment source | Admitting: Internal Medicine

## 2013-02-01 ENCOUNTER — Encounter: Payer: Self-pay | Admitting: Internal Medicine

## 2013-02-01 VITALS — BP 133/89 | HR 80 | Temp 97.7°F | Resp 16 | Ht 70.0 in | Wt 164.0 lb

## 2013-02-01 DIAGNOSIS — Z1211 Encounter for screening for malignant neoplasm of colon: Secondary | ICD-10-CM

## 2013-02-01 DIAGNOSIS — D126 Benign neoplasm of colon, unspecified: Secondary | ICD-10-CM

## 2013-02-01 DIAGNOSIS — Z86010 Personal history of colonic polyps: Secondary | ICD-10-CM

## 2013-02-01 DIAGNOSIS — Z800 Family history of malignant neoplasm of digestive organs: Secondary | ICD-10-CM

## 2013-02-01 MED ORDER — SODIUM CHLORIDE 0.9 % IV SOLN: 500.0000 mL | INTRAVENOUS | Status: DC

## 2013-02-01 NOTE — Progress Notes (Signed)
Patient denies any allergies to eggs or soy. Patient denies any problems with anesthesia.  

## 2013-02-01 NOTE — Progress Notes (Signed)
Called to room to assist during endoscopic procedure.  Patient ID and intended procedure confirmed with present staff. Received instructions for my participation in the procedure from the performing physician.  

## 2013-02-01 NOTE — Progress Notes (Signed)
Patient did not experience any of the following events: a burn prior to discharge; a fall within the facility; wrong site/side/patient/procedure/implant event; or a hospital transfer or hospital admission upon discharge from the facility. (G8907) Patient did not have preoperative order for IV antibiotic SSI prophylaxis. (G8918)  

## 2013-02-01 NOTE — Op Note (Signed)
[facility] [address] [place], [address]   COLONOSCOPY PROCEDURE REPORT  PATIENT: Timothy, [name]  MR#: [id number] BIRTHDATE: 12/04/1949 , 63  yrs. old GENDER: Male ENDOSCOPIST: Roxy Cedar, MD REFERRED [id number] Program Recall PROCEDURE DATE:  02/01/2013 PROCEDURE:   Colonoscopy with snare polypectomy x2 First Screening Colonoscopy - Avg.  risk and is 50 yrs.  old or older - No.  Prior Negative Screening - Now for repeat screening. N/A  History of Adenoma - Now for follow-up colonoscopy & has been > or = to 3 yrs.  N/A  Polyps Removed Today? Yes. ASA CLASS:   Class II INDICATIONS:Patient's personal history of colon polyps and Patient's immediate family history of colon cancer(82). Negative exam 2004. Hyperplastic polyp 2009. MEDICATIONS: MAC sedation, administered by CRNA and propofol (Diprivan) 350mg  IV DESCRIPTION OF PROCEDURE:   After the risks benefits and alternatives of the procedure were thoroughly explained, informed consent was obtained.  A digital rectal exam revealed no abnormalities of the rectum.   The LB [id number]  endoscope was introduced through the anus and advanced to the cecum, which was identified by both the appendix and ileocecal valve. No adverse events experienced.   The quality of the prep was good, using MoviPrep  The instrument was then slowly withdrawn as the colon was fully examined.  COLON FINDINGS: Two sessile polyps ranging between 3-27mm in size were found at the cecum and in the descending colon.  A polypectomy was performed with a cold snare.  The resection was complete and the polyp tissue was retrieved in one of 2 polyps.   Mild diverticulosis was noted in the sigmoid colon.   The colon mucosa was otherwise normal.  Retroflexed views revealed internal hemorrhoids. The time to cecum=2 minutes 25 seconds.  Withdrawal time=10 minutes 25 seconds.  The scope was withdrawn and the procedure  completed. COMPLICATIONS: There were no complications.  ENDOSCOPIC IMPRESSION: 1.   Two sessile polyps in the cecum and in the descending colon; polypectomy was performed with a cold snare 2.   Mild diverticulosis was noted in the sigmoid colon 3.   The colon mucosa was otherwise normal  RECOMMENDATIONS: 1. Follow up colonoscopy in 5 years   eSigned:  Roxy Cedar, MD 02/01/2013 9:51 AM   cc: Gordy Savers, MD and The Patient

## 2013-02-01 NOTE — Patient Instructions (Addendum)

## 2013-02-02 ENCOUNTER — Telehealth: Payer: Self-pay | Admitting: *Deleted

## 2013-02-02 NOTE — Telephone Encounter (Signed)
  Follow up Call-  Call back number 02/01/2013  Post procedure Call Back phone  # [phone]  Permission to leave phone message Yes     Patient questions:  Do you have a fever, pain , or abdominal swelling? no Pain Score  0 *  Have you tolerated food without any problems? yes  Have you been able to return to your normal activities? yes  Do you have any questions about your discharge instructions: Diet   no Medications  no Follow up visit  no  Do you have questions or concerns about your Care? no  Actions: * If pain score is 4 or above: No action needed, pain <4.

## 2013-02-06 ENCOUNTER — Encounter: Payer: Self-pay | Admitting: Internal Medicine

## 2013-02-21 ENCOUNTER — Other Ambulatory Visit: Payer: Self-pay | Admitting: Internal Medicine

## 2013-05-24 ENCOUNTER — Ambulatory Visit: Payer: No Typology Code available for payment source | Admitting: Internal Medicine

## 2013-11-29 ENCOUNTER — Other Ambulatory Visit: Payer: Self-pay | Admitting: Internal Medicine

## 2014-01-02 ENCOUNTER — Ambulatory Visit (INDEPENDENT_AMBULATORY_CARE_PROVIDER_SITE_OTHER): Payer: No Typology Code available for payment source | Admitting: *Deleted

## 2014-01-02 ENCOUNTER — Ambulatory Visit: Payer: No Typology Code available for payment source

## 2014-01-02 DIAGNOSIS — Z23 Encounter for immunization: Secondary | ICD-10-CM

## 2014-03-19 ENCOUNTER — Other Ambulatory Visit: Payer: Self-pay | Admitting: Dermatology

## 2014-04-24 ENCOUNTER — Other Ambulatory Visit: Payer: Self-pay | Admitting: Internal Medicine

## 2014-05-02 ENCOUNTER — Other Ambulatory Visit (INDEPENDENT_AMBULATORY_CARE_PROVIDER_SITE_OTHER): Payer: No Typology Code available for payment source

## 2014-05-02 DIAGNOSIS — Z0000 Encounter for general adult medical examination without abnormal findings: Secondary | ICD-10-CM

## 2014-05-02 LAB — COMPREHENSIVE METABOLIC PANEL
ALT: 53 U/L (ref 0–53)
AST: 49 U/L — ABNORMAL HIGH (ref 0–37)
Albumin: 4.6 g/dL (ref 3.5–5.2)
Alkaline Phosphatase: 52 U/L (ref 39–117)
BILIRUBIN TOTAL: 0.7 mg/dL (ref 0.2–1.2)
BUN: 12 mg/dL (ref 6–23)
CO2: 31 mEq/L (ref 19–32)
CREATININE: 0.88 mg/dL (ref 0.40–1.50)
Calcium: 9.7 mg/dL (ref 8.4–10.5)
Chloride: 104 mEq/L (ref 96–112)
GFR: 92.55 mL/min (ref >60.00)
Glucose, Bld: 93 mg/dL (ref 70–99)
Potassium: 4.9 mEq/L (ref 3.5–5.1)
Sodium: 139 mEq/L (ref 135–145)
Total Protein: 7.2 g/dL (ref 6.0–8.3)

## 2014-05-02 LAB — CBC WITH DIFFERENTIAL/PLATELET
Basophils Absolute: 0 10*3/uL (ref 0.0–0.1)
Basophils Relative: 0.5 % (ref 0.0–3.0)
EOS ABS: 0.1 10*3/uL (ref 0.0–0.7)
Eosinophils Relative: 2.4 % (ref 0.0–5.0)
HEMATOCRIT: 44.3 % (ref 39.0–52.0)
Hemoglobin: 15 g/dL (ref 13.0–17.0)
LYMPHS ABS: 1.4 10*3/uL (ref 0.7–4.0)
Lymphocytes Relative: 28 % (ref 12.0–46.0)
MCHC: 33.8 g/dL (ref 30.0–36.0)
MCV: 92.3 fl (ref 78.0–100.0)
MONO ABS: 0.7 10*3/uL (ref 0.1–1.0)
MONOS PCT: 12.7 % — AB (ref 3.0–12.0)
Neutro Abs: 2.9 10*3/uL (ref 1.4–7.7)
Neutrophils Relative %: 56.4 % (ref 43.0–77.0)
Platelets: 195 10*3/uL (ref 150.0–400.0)
RBC: 4.8 Mil/uL (ref 4.22–5.81)
RDW: 13 % (ref 11.5–15.5)
WBC: 5.2 10*3/uL (ref 4.0–10.5)

## 2014-05-02 LAB — POCT URINALYSIS DIPSTICK
Bilirubin, UA: NEGATIVE
Blood, UA: NEGATIVE
Glucose, UA: NEGATIVE
KETONES UA: NEGATIVE
Leukocytes, UA: NEGATIVE
Nitrite, UA: NEGATIVE
PH UA: 5.5
PROTEIN UA: NEGATIVE
SPEC GRAV UA: 1.015
Urobilinogen, UA: 0.2

## 2014-05-02 LAB — LIPID PANEL
Cholesterol: 178 mg/dL (ref 0–200)
HDL: 74.9 mg/dL (ref >39.00)
LDL Cholesterol: 81 mg/dL (ref 0–99)
NONHDL: 103.1
Total CHOL/HDL Ratio: 2
Triglycerides: 112 mg/dL (ref 0.0–149.0)
VLDL: 22.4 mg/dL (ref 0.0–40.0)

## 2014-05-02 LAB — TSH: TSH: 0.74 u[IU]/mL (ref 0.35–4.50)

## 2014-05-02 LAB — PSA: PSA: 1.06 ng/mL (ref 0.10–4.00)

## 2014-05-09 ENCOUNTER — Encounter: Payer: Self-pay | Admitting: Internal Medicine

## 2014-05-09 ENCOUNTER — Ambulatory Visit (INDEPENDENT_AMBULATORY_CARE_PROVIDER_SITE_OTHER): Payer: No Typology Code available for payment source | Admitting: Internal Medicine

## 2014-05-09 VITALS — BP 160/80 | HR 93 | Temp 98.4°F | Resp 20 | Ht 69.0 in | Wt 166.0 lb

## 2014-05-09 DIAGNOSIS — Z0000 Encounter for general adult medical examination without abnormal findings: Secondary | ICD-10-CM

## 2014-05-09 MED ORDER — TADALAFIL 20 MG PO TABS: 20.0000 mg | ORAL_TABLET | Freq: Every day | ORAL | Status: DC | PRN

## 2014-05-09 MED ORDER — ATORVASTATIN CALCIUM 20 MG PO TABS: 20.0000 mg | ORAL_TABLET | Freq: Every day | ORAL | Status: DC

## 2014-05-09 MED ORDER — SILDENAFIL CITRATE 100 MG PO TABS: ORAL_TABLET | ORAL | Status: DC

## 2014-05-09 NOTE — Progress Notes (Signed)
Pre visit review using our clinic review tool, if applicable. No additional management support is needed unless otherwise documented below in the visit note. 

## 2014-05-09 NOTE — Patient Instructions (Signed)
Please check your blood pressure on a regular basis.  If it is consistently greater than 150/90, please make an office appointment.  Health Maintenance A healthy lifestyle and preventative care can promote health and wellness.  Maintain regular health, dental, and eye exams.  Eat a healthy diet. Foods like vegetables, fruits, whole grains, low-fat dairy products, and lean protein foods contain the nutrients you need and are low in calories. Decrease your intake of foods high in solid fats, added sugars, and salt. Get information about a proper diet from your health care provider, if necessary.  Regular physical exercise is one of the most important things you can do for your health. Most adults should get at least 150 minutes of moderate-intensity exercise (any activity that increases your heart rate and causes you to sweat) each week. In addition, most adults need muscle-strengthening exercises on 2 or more days a week.   Maintain a healthy weight. The body mass index (BMI) is a screening tool to identify possible weight problems. It provides an estimate of body fat based on height and weight. Your health care provider can find your BMI and can help you achieve or maintain a healthy weight. For males 20 years and older:  A BMI below 18.5 is considered underweight.  A BMI of 18.5 to 24.9 is normal.  A BMI of 25 to 29.9 is considered overweight.  A BMI of 30 and above is considered obese.  Maintain normal blood lipids and cholesterol by exercising and minimizing your intake of saturated fat. Eat a balanced diet with plenty of fruits and vegetables. Blood tests for lipids and cholesterol should begin at age 82 and be repeated every 5 years. If your lipid or cholesterol levels are high, you are over age 61, or you are at high risk for heart disease, you may need your cholesterol levels checked more frequently.Ongoing high lipid and cholesterol levels should be treated with medicines if diet and  exercise are not working.  If you smoke, find out from your health care provider how to quit. If you do not use tobacco, do not start.  Lung cancer screening is recommended for adults aged 69-80 years who are at high risk for developing lung cancer because of a history of smoking. A yearly low-dose CT scan of the lungs is recommended for people who have at least a 30-pack-year history of smoking and are current smokers or have quit within the past 15 years. A pack year of smoking is smoking an average of 1 pack of cigarettes a day for 1 year (for example, a 30-pack-year history of smoking could mean smoking 1 pack a day for 30 years or 2 packs a day for 15 years). Yearly screening should continue until the smoker has stopped smoking for at least 15 years. Yearly screening should be stopped for people who develop a health problem that would prevent them from having lung cancer treatment.  If you choose to drink alcohol, do not have more than 2 drinks per day. One drink is considered to be 12 oz (360 mL) of beer, 5 oz (150 mL) of wine, or 1.5 oz (45 mL) of liquor.  Avoid the use of street drugs. Do not share needles with anyone. Ask for help if you need support or instructions about stopping the use of drugs.  High blood pressure causes heart disease and increases the risk of stroke. Blood pressure should be checked at least every 1-2 years. Ongoing high blood pressure should be treated  with medicines if weight loss and exercise are not effective.  If you are 73-57 years old, ask your health care provider if you should take aspirin to prevent heart disease.  Diabetes screening involves taking a blood sample to check your fasting blood sugar level. This should be done once every 3 years after age 57 if you are at a normal weight and without risk factors for diabetes. Testing should be considered at a younger age or be carried out more frequently if you are overweight and have at least 1 risk factor for  diabetes.  Colorectal cancer can be detected and often prevented. Most routine colorectal cancer screening begins at the age of 90 and continues through age 1. However, your health care provider may recommend screening at an earlier age if you have risk factors for colon cancer. On a yearly basis, your health care provider may provide home test kits to check for hidden blood in the stool. A small camera at the end of a tube may be used to directly examine the colon (sigmoidoscopy or colonoscopy) to detect the earliest forms of colorectal cancer. Talk to your health care provider about this at age 40 when routine screening begins. A direct exam of the colon should be repeated every 5-10 years through age 20, unless early forms of precancerous polyps or small growths are found.  People who are at an increased risk for hepatitis B should be screened for this virus. You are considered at high risk for hepatitis B if:  You were born in a country where hepatitis B occurs often. Talk with your health care provider about which countries are considered high risk.  Your parents were born in a high-risk country and you have not received a shot to protect against hepatitis B (hepatitis B vaccine).  You have HIV or AIDS.  You use needles to inject street drugs.  You live with, or have sex with, someone who has hepatitis B.  You are a man who has sex with other men (MSM).  You get hemodialysis treatment.  You take certain medicines for conditions like cancer, organ transplantation, and autoimmune conditions.  Hepatitis C blood testing is recommended for all people born from [birth year] through 1965 and any individual with known risk factors for hepatitis C.  Healthy men should no longer receive prostate-specific antigen (PSA) blood tests as part of routine cancer screening. Talk to your health care provider about prostate cancer screening.  Testicular cancer screening is not recommended for adolescents or  adult males who have no symptoms. Screening includes self-exam, a health care provider exam, and other screening tests. Consult with your health care provider about any symptoms you have or any concerns you have about testicular cancer.  Practice safe sex. Use condoms and avoid high-risk sexual practices to reduce the spread of sexually transmitted infections (STIs).  You should be screened for STIs, including gonorrhea and chlamydia if:  You are sexually active and are younger than 24 years.  You are older than 24 years, and your health care provider tells you that you are at risk for this type of infection.  Your sexual activity has changed since you were last screened, and you are at an increased risk for chlamydia or gonorrhea. Ask your health care provider if you are at risk.  If you are at risk of being infected with HIV, it is recommended that you take a prescription medicine daily to prevent HIV infection. This is called pre-exposure prophylaxis (PrEP). You are  considered at risk if:  You are a man who has sex with other men (MSM).  You are a heterosexual man who is sexually active with multiple partners.  You take drugs by injection.  You are sexually active with a partner who has HIV.  Talk with your health care provider about whether you are at high risk of being infected with HIV. If you choose to begin PrEP, you should first be tested for HIV. You should then be tested every 3 months for as long as you are taking PrEP.  Use sunscreen. Apply sunscreen liberally and repeatedly throughout the day. You should seek shade when your shadow is shorter than you. Protect yourself by wearing long sleeves, pants, a wide-brimmed hat, and sunglasses year round whenever you are outdoors.  Tell your health care provider of new moles or changes in moles, especially if there is a change in shape or color. Also, tell your health care provider if a mole is larger than the size of a pencil  eraser.  A one-time screening for abdominal aortic aneurysm (AAA) and surgical repair of large AAAs by ultrasound is recommended for men aged 66-75 years who are current or former smokers.  Stay current with your vaccines (immunizations). Document Released: 08/14/2007 Document Revised: 02/20/2013 Document Reviewed: 07/13/2010 [facility] Patient Information 2015 [place], [place]. This information is not intended to replace advice given to you by your health care provider. Make sure you discuss any questions you have with your health care provider.

## 2014-05-09 NOTE — Progress Notes (Signed)
Subjective:    Patient ID: Timothy [name], male    DOB: 12/04/1949, 64 y.o.   MRN: [id number]  HPI Patient ID: Timothy [name], male   DOB: 12/04/1949, 64 y.o.   MRN: [id number]  Subjective:    Patient ID: Timothy [name], male    DOB: 12/04/1949, 64 y.o.   MRN: [id number]  HPI  64 -year-old patient who is seen today for a wellness exam. He has a history of dyslipidemia osteoarthritis status post right total knee replacement surgery. He has a history of colonic polyps and has had a followup colonoscopy.  there is a history of nephrolithiasis. He is doing quite well and maintains on a  prudent diet;  he exercises regularly  4 times per week  Patient has been a hypertensive suspect he does check blood pressures at home consistently with normal readings.  Blood pressure at office evaluation.  Sometimes slightly elevated  Past Medical History  Diagnosis Date  . COLONIC POLYPS, HX OF 02/12/2009  . HYPERLIPIDEMIA 11/21/2007  . 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 OSTEOARTHRITIS 01/17/2007    History   Social History  . Marital Status: Married    Spouse Name: N/A  . Number of Children: N/A  . Years of Education: N/A   Occupational History  . Not on file.   Social History Main Topics  . Smoking status: Never Smoker   . Smokeless tobacco: Current User    Types: Chew  . Alcohol Use: 3.6 - 4.8 oz/week    6-8 Cans of beer per week  . Drug Use: No  . Sexual Activity: Not on file   Other Topics Concern  . Not on file   Social History Narrative    Past Surgical History  Procedure Laterality Date  . Appendectomy    . Hernia repair      ingunial  . Knee arthroscopy      bilateral  . Total knee arthroplasty      right  . Carpal tunnel release    . Shoulder surgery      left    Family History  Problem Relation Age of Onset  . Cancer Father     colon ca  . Colon cancer Father   . Esophageal cancer Neg Hx   . Rectal cancer Neg Hx   . Stomach cancer Neg Hx      No Known Allergies  Current Outpatient Prescriptions on File Prior to Visit  Medication Sig Dispense Refill  . aspirin 81 MG tablet Take 162 mg by mouth daily.    . Multiple Vitamin (MULTIVITAMIN) tablet Take 1 tablet by mouth daily.     No current facility-administered medications on file prior to visit.    BP 160/80 mmHg  Pulse 93  Temp(Src) 98.4 F (36.9 C) (Oral)  Resp 20  Ht 5\' 9"  (1.753 m)  Wt 166 lb (75.297 kg)  BMI 24.50 kg/m2  SpO2 98%       Review of Systems  Constitutional: Negative for fever, chills, activity change, appetite change and fatigue.  HENT: Negative for hearing loss, ear pain, congestion, rhinorrhea, sneezing, mouth sores, trouble swallowing, neck pain, neck stiffness, dental problem, voice change, sinus pressure and tinnitus.   Eyes: Negative for photophobia, pain, redness and visual disturbance.  Respiratory: Negative for apnea, cough, choking, chest tightness, shortness of breath and wheezing.   Cardiovascular: Negative for chest pain, palpitations and leg swelling.  Gastrointestinal: Negative for nausea, vomiting, abdominal pain, diarrhea, constipation, blood in  stool, abdominal distention, anal bleeding and rectal pain.  Genitourinary: Negative for dysuria, urgency, frequency, hematuria, flank pain, decreased urine volume, discharge, penile swelling, scrotal swelling, difficulty urinating, genital sores and testicular pain.  Musculoskeletal: Negative for myalgias, back pain, joint swelling, arthralgias and gait problem.  Skin: Negative for color change, rash and wound.  Neurological: Negative for dizziness, tremors, seizures, syncope, facial asymmetry, speech difficulty, weakness, light-headedness, numbness and headaches.  Hematological: Negative for adenopathy. Does not bruise/bleed easily.  Psychiatric/Behavioral: Negative for suicidal ideas, hallucinations, behavioral problems, confusion, sleep disturbance, self-injury, dysphoric mood,  decreased concentration and agitation. The patient is not nervous/anxious.        Objective:   Physical Exam  Constitutional: He appears well-developed and well-nourished.  HENT:  Head: Normocephalic and atraumatic.  Right Ear: External ear normal.  Left Ear: External ear normal.  Nose: Nose normal.  Mouth/Throat: Oropharynx is clear and moist.  Eyes: Conjunctivae and EOM are normal. Pupils are equal, round, and reactive to light. No scleral icterus.  Neck: Normal range of motion. Neck supple. No JVD present. No thyromegaly present.  Cardiovascular: Regular rhythm, normal heart sounds and intact distal pulses.  Exam reveals no gallop and no friction rub.   No murmur heard. Pulmonary/Chest: Effort normal and breath sounds normal. He exhibits no tenderness.  Abdominal: Soft. Bowel sounds are normal. He exhibits no distension and no mass. There is no tenderness.  Genitourinary: Prostate normal and penis normal.  Musculoskeletal: Normal range of motion. He exhibits no edema and no tenderness.  Status post right total replacement  Lymphadenopathy:    He has no cervical adenopathy.  Neurological: He is alert. He has normal reflexes. No cranial nerve deficit. Coordination normal.  Skin: Skin is warm and dry. No rash noted.  Psychiatric: He has a normal mood and affect. His behavior is normal.          Assessment & Plan:  Preventive health examination  Dyslipidemia.  Continue atorvastatin 20  Colonoscopy five-year intervals   Review of Systems    as above Objective:   Physical Exam   As above     Assessment & Plan:   As above

## 2014-05-10 ENCOUNTER — Telehealth: Payer: Self-pay | Admitting: Internal Medicine

## 2014-05-10 NOTE — Telephone Encounter (Signed)
emmi emailed °

## 2015-01-07 ENCOUNTER — Ambulatory Visit (INDEPENDENT_AMBULATORY_CARE_PROVIDER_SITE_OTHER): Payer: No Typology Code available for payment source

## 2015-01-07 DIAGNOSIS — Z23 Encounter for immunization: Secondary | ICD-10-CM

## 2015-05-13 ENCOUNTER — Other Ambulatory Visit (INDEPENDENT_AMBULATORY_CARE_PROVIDER_SITE_OTHER): Payer: 59

## 2015-05-13 DIAGNOSIS — Z0000 Encounter for general adult medical examination without abnormal findings: Secondary | ICD-10-CM

## 2015-05-13 LAB — POC URINALSYSI DIPSTICK (AUTOMATED)
BILIRUBIN UA: NEGATIVE
Blood, UA: NEGATIVE
Glucose, UA: NEGATIVE
LEUKOCYTES UA: NEGATIVE
Nitrite, UA: NEGATIVE
PH UA: 5.5
PROTEIN UA: NEGATIVE
SPEC GRAV UA: 1.025
Urobilinogen, UA: 0.2

## 2015-05-13 LAB — BASIC METABOLIC PANEL WITH GFR
BUN: 11 mg/dL (ref 6–23)
CO2: 27 meq/L (ref 19–32)
Calcium: 9.9 mg/dL (ref 8.4–10.5)
Chloride: 102 meq/L (ref 96–112)
Creatinine, Ser: 0.76 mg/dL (ref 0.40–1.50)
GFR: 109.26 mL/min
Glucose, Bld: 100 mg/dL — ABNORMAL HIGH (ref 70–99)
Potassium: 4.4 meq/L (ref 3.5–5.1)
Sodium: 141 meq/L (ref 135–145)

## 2015-05-13 LAB — LIPID PANEL
CHOL/HDL RATIO: 3
Cholesterol: 216 mg/dL — ABNORMAL HIGH (ref 0–200)
HDL: 80.2 mg/dL (ref >39.00)
LDL CALC: 100 mg/dL — AB (ref 0–99)
NONHDL: 136.2
TRIGLYCERIDES: 182 mg/dL — AB (ref 0.0–149.0)
VLDL: 36.4 mg/dL (ref 0.0–40.0)

## 2015-05-13 LAB — CBC WITH DIFFERENTIAL/PLATELET
BASOS PCT: 0.7 % (ref 0.0–3.0)
Basophils Absolute: 0 10*3/uL (ref 0.0–0.1)
EOS PCT: 2 % (ref 0.0–5.0)
Eosinophils Absolute: 0.1 10*3/uL (ref 0.0–0.7)
HCT: 44.4 % (ref 39.0–52.0)
Hemoglobin: 15.2 g/dL (ref 13.0–17.0)
LYMPHS ABS: 1.9 10*3/uL (ref 0.7–4.0)
Lymphocytes Relative: 34.2 % (ref 12.0–46.0)
MCHC: 34.2 g/dL (ref 30.0–36.0)
MCV: 91.9 fl (ref 78.0–100.0)
MONO ABS: 0.8 10*3/uL (ref 0.1–1.0)
MONOS PCT: 14.2 % — AB (ref 3.0–12.0)
NEUTROS PCT: 48.9 % (ref 43.0–77.0)
Neutro Abs: 2.8 10*3/uL (ref 1.4–7.7)
Platelets: 185 10*3/uL (ref 150.0–400.0)
RBC: 4.83 Mil/uL (ref 4.22–5.81)
RDW: 12.4 % (ref 11.5–15.5)
WBC: 5.7 10*3/uL (ref 4.0–10.5)

## 2015-05-13 LAB — HEPATIC FUNCTION PANEL
ALK PHOS: 46 U/L (ref 39–117)
ALT: 61 U/L — AB (ref 0–53)
AST: 59 U/L — AB (ref 0–37)
Albumin: 4.7 g/dL (ref 3.5–5.2)
BILIRUBIN DIRECT: 0.2 mg/dL (ref 0.0–0.3)
BILIRUBIN TOTAL: 0.9 mg/dL (ref 0.2–1.2)
TOTAL PROTEIN: 7.4 g/dL (ref 6.0–8.3)

## 2015-05-13 LAB — TSH: TSH: 0.87 u[IU]/mL (ref 0.35–4.50)

## 2015-05-13 LAB — PSA: PSA: 1.07 ng/mL (ref 0.10–4.00)

## 2015-05-20 ENCOUNTER — Encounter: Payer: Self-pay | Admitting: Internal Medicine

## 2015-05-20 ENCOUNTER — Ambulatory Visit (INDEPENDENT_AMBULATORY_CARE_PROVIDER_SITE_OTHER): Payer: 59 | Admitting: Internal Medicine

## 2015-05-20 VITALS — BP 160/90 | HR 106 | Temp 99.0°F | Resp 20 | Ht 69.0 in | Wt 165.0 lb

## 2015-05-20 DIAGNOSIS — Z23 Encounter for immunization: Secondary | ICD-10-CM

## 2015-05-20 DIAGNOSIS — M150 Primary generalized (osteo)arthritis: Secondary | ICD-10-CM | POA: Diagnosis not present

## 2015-05-20 DIAGNOSIS — Z0000 Encounter for general adult medical examination without abnormal findings: Secondary | ICD-10-CM

## 2015-05-20 DIAGNOSIS — Z86010 Personal history of colonic polyps: Secondary | ICD-10-CM

## 2015-05-20 DIAGNOSIS — R030 Elevated blood-pressure reading, without diagnosis of hypertension: Secondary | ICD-10-CM | POA: Diagnosis not present

## 2015-05-20 DIAGNOSIS — M159 Polyosteoarthritis, unspecified: Secondary | ICD-10-CM

## 2015-05-20 DIAGNOSIS — E785 Hyperlipidemia, unspecified: Secondary | ICD-10-CM | POA: Diagnosis not present

## 2015-05-20 MED ORDER — TADALAFIL 20 MG PO TABS: 20.0000 mg | ORAL_TABLET | Freq: Every day | ORAL | Status: DC | PRN

## 2015-05-20 MED ORDER — ATORVASTATIN CALCIUM 20 MG PO TABS: 20.0000 mg | ORAL_TABLET | Freq: Every day | ORAL | Status: DC

## 2015-05-20 NOTE — Patient Instructions (Signed)
Limit your sodium (Salt) intake    It is important that you exercise regularly, at least 20 minutes 3 to 4 times per week.  If you develop chest pain or shortness of breath seek  medical attention.  Please check your blood pressure on a regular basis.  If it is consistently greater than 150/90, please make an office appointment.  Return in one year for follow-up  

## 2015-05-20 NOTE — Progress Notes (Signed)
Pre visit review using our clinic review tool, if applicable. No additional management support is needed unless otherwise documented below in the visit note. 

## 2015-05-20 NOTE — Progress Notes (Signed)
Subjective:    Patient ID: Timothy [name], male    DOB: 12/04/1949, 65 y.o.   MRN: [id number]  HPI  Patient ID: Timothy [name], male   DOB: 12/04/1949, 65 y.o.   MRN: [id number]  Subjective:    Patient ID: Timothy [name], male    DOB: 12/04/1949, 65 y.o.   MRN: [id number]  HPI  65 year old patient who is seen today for a wellness exam.  He has a history of dyslipidemia osteoarthritis status post right total knee replacement surgery. He has a history of colonic polyps and has had a followup colonoscopy in 2014.  there is a history of nephrolithiasis. He is doing quite well and maintains on a  prudent diet;  he exercises regularly  4 times per week  Patient has been a hypertensive suspect he does check blood pressures at home consistently with normal readings.  Blood pressure at office evaluation.  Sometimes slightly elevated  Past Medical History  Diagnosis Date  . COLONIC POLYPS, HX OF 02/12/2009  . HYPERLIPIDEMIA 11/21/2007  . 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 OSTEOARTHRITIS 01/17/2007    Social History   Social History  . Marital Status: Married    Spouse Name: N/A  . Number of Children: N/A  . Years of Education: N/A   Occupational History  . Not on file.   Social History Main Topics  . Smoking status: Never Smoker   . Smokeless tobacco: Current User    Types: Chew  . Alcohol Use: 3.6 - 4.8 oz/week    6-8 Cans of beer per week  . Drug Use: No  . Sexual Activity: Not on file   Other Topics Concern  . Not on file   Social History Narrative    Past Surgical History  Procedure Laterality Date  . Appendectomy    . Hernia repair      ingunial  . Knee arthroscopy      bilateral  . Total knee arthroplasty      right  . Carpal tunnel release    . Shoulder surgery      left    Family History  Problem Relation Age of Onset  . Cancer Father     colon ca  . Colon cancer Father   . Esophageal cancer Neg Hx   . Rectal cancer Neg Hx   .  Stomach cancer Neg Hx     No Known Allergies  Current Outpatient Prescriptions on File Prior to Visit  Medication Sig Dispense Refill  . aspirin 81 MG tablet Take 162 mg by mouth daily.    . Multiple Vitamin (MULTIVITAMIN) tablet Take 1 tablet by mouth daily.     No current facility-administered medications on file prior to visit.    BP 160/90 mmHg  Pulse 106  Temp(Src) 99 F (37.2 C) (Oral)  Resp 20  Ht 5\' 9"  (1.753 m)  Wt 165 lb (74.844 kg)  BMI 24.36 kg/m2  SpO2 98%       Review of Systems  Constitutional: Negative for fever, chills, activity change, appetite change and fatigue.  HENT: Negative for hearing loss, ear pain, congestion, rhinorrhea, sneezing, mouth sores, trouble swallowing, neck pain, neck stiffness, dental problem, voice change, sinus pressure and tinnitus.   Eyes: Negative for photophobia, pain, redness and visual disturbance.  Respiratory: Negative for apnea, cough, choking, chest tightness, shortness of breath and wheezing.   Cardiovascular: Negative for chest pain, palpitations and leg swelling.  Gastrointestinal: Negative for nausea, vomiting, abdominal pain,  diarrhea, constipation, blood in stool, abdominal distention, anal bleeding and rectal pain.  Genitourinary: Negative for dysuria, urgency, frequency, hematuria, flank pain, decreased urine volume, discharge, penile swelling, scrotal swelling, difficulty urinating, genital sores and testicular pain.  Musculoskeletal: Negative for myalgias, back pain, joint swelling, arthralgias and gait problem.  Skin: Negative for color change, rash and wound.  Neurological: Negative for dizziness, tremors, seizures, syncope, facial asymmetry, speech difficulty, weakness, light-headedness, numbness and headaches.  Hematological: Negative for adenopathy. Does not bruise/bleed easily.  Psychiatric/Behavioral: Negative for suicidal ideas, hallucinations, behavioral problems, confusion, sleep disturbance, self-injury,  dysphoric mood, decreased concentration and agitation. The patient is not nervous/anxious.        Objective:   Physical Exam  Constitutional: He appears well-developed and well-nourished.  HENT:  Head: Normocephalic and atraumatic.  Right Ear: External ear normal.  Left Ear: External ear normal.  Nose: Nose normal.  Mouth/Throat: Oropharynx is clear and moist.  Eyes: Conjunctivae and EOM are normal. Pupils are equal, round, and reactive to light. No scleral icterus.  Neck: Normal range of motion. Neck supple. No JVD present. No thyromegaly present.  Cardiovascular: Regular rhythm, normal heart sounds and intact distal pulses.  Exam reveals no gallop and no friction rub.   No murmur heard. Pulmonary/Chest: Effort normal and breath sounds normal. He exhibits no tenderness.  Abdominal: Soft. Bowel sounds are normal. He exhibits no distension and no mass. There is no tenderness.  Genitourinary: Prostate normal and penis normal.  Musculoskeletal: Normal range of motion. He exhibits no edema and no tenderness.  Status post right total replacement  Lymphadenopathy:    He has no cervical adenopathy.  Neurological: He is alert. He has normal reflexes. No cranial nerve deficit. Coordination normal.  Skin: Skin is warm and dry. No rash noted.  Psychiatric: He has a normal mood and affect. His behavior is normal.          Assessment & Plan:  Preventive health examination  Dyslipidemia.  Continue atorvastatin 20  Colonoscopy five-year intervals   Review of Systems     as above Objective:   Physical Exam    As above     Assessment & Plan:   As above

## 2015-05-20 NOTE — Addendum Note (Signed)
Addended by: Marian Sorrow on: 05/20/2015 03:16 PM   Modules accepted: Orders, SmartSet

## 2015-05-21 LAB — HEPATITIS C ANTIBODY: HCV AB: NEGATIVE

## 2015-06-11 ENCOUNTER — Other Ambulatory Visit: Payer: Self-pay | Admitting: Internal Medicine

## 2016-01-30 ENCOUNTER — Ambulatory Visit (INDEPENDENT_AMBULATORY_CARE_PROVIDER_SITE_OTHER): Payer: Medicare Other

## 2016-01-30 DIAGNOSIS — Z23 Encounter for immunization: Secondary | ICD-10-CM | POA: Diagnosis not present

## 2016-03-30 DIAGNOSIS — L300 Nummular dermatitis: Secondary | ICD-10-CM | POA: Diagnosis not present

## 2016-03-30 DIAGNOSIS — L820 Inflamed seborrheic keratosis: Secondary | ICD-10-CM | POA: Diagnosis not present

## 2016-03-30 DIAGNOSIS — L570 Actinic keratosis: Secondary | ICD-10-CM | POA: Diagnosis not present

## 2016-03-30 DIAGNOSIS — L812 Freckles: Secondary | ICD-10-CM | POA: Diagnosis not present

## 2016-04-07 ENCOUNTER — Encounter: Payer: Self-pay | Admitting: Internal Medicine

## 2016-04-07 ENCOUNTER — Ambulatory Visit (INDEPENDENT_AMBULATORY_CARE_PROVIDER_SITE_OTHER): Payer: Medicare Other | Admitting: Internal Medicine

## 2016-04-07 DIAGNOSIS — I10 Essential (primary) hypertension: Secondary | ICD-10-CM | POA: Insufficient documentation

## 2016-04-07 HISTORY — DX: Essential (primary) hypertension: I10

## 2016-04-07 MED ORDER — AMOXICILLIN-POT CLAVULANATE 875-125 MG PO TABS: 1.0000 | ORAL_TABLET | Freq: Two times a day (BID) | ORAL | 0 refills | Status: DC

## 2016-04-07 MED ORDER — LISINOPRIL-HYDROCHLOROTHIAZIDE 20-25 MG PO TABS: 1.0000 | ORAL_TABLET | Freq: Every day | ORAL | 3 refills | Status: DC

## 2016-04-07 MED ORDER — FLUTICASONE PROPIONATE 50 MCG/ACT NA SUSP: 2.0000 | Freq: Every day | NASAL | 6 refills | Status: DC

## 2016-04-07 NOTE — Progress Notes (Signed)
Pre visit review using our clinic review tool, if applicable. No additional management support is needed unless otherwise documented below in the visit note. 

## 2016-04-07 NOTE — Progress Notes (Signed)
Subjective:    Patient ID: Timothy [name], male    DOB: 12/04/1949, 66 y.o.   MRN: [id number]  HPI 66 year old patient who has a history of borderline hypertension.  He continues to monitor blood pressure readings at home which have trended up.  He states readings are often 170 over [id number] diastolic  History complaint today is chronic sinus congestion.  This is interfered with sleep for the past 3 months.  He describes copious discolored and bloody mucus, worse at night that interferes with sleep.  He has had some sinus fullness and discomfort. No fever or other constitutional complaints  Past Medical History:  Diagnosis Date  . COLONIC POLYPS, HX OF 02/12/2009  . HYPERLIPIDEMIA 11/21/2007  . 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 OSTEOARTHRITIS 01/17/2007     Social History   Social History  . Marital status: Married    Spouse name: N/A  . Number of children: N/A  . Years of education: N/A   Occupational History  . Not on file.   Social History Main Topics  . Smoking status: Never Smoker  . Smokeless tobacco: Current User    Types: Chew  . Alcohol use 3.6 - 4.8 oz/week    6 - 8 Cans of beer per week  . Drug use: No  . Sexual activity: Not on file   Other Topics Concern  . Not on file   Social History Narrative  . No narrative on file    Past Surgical History:  Procedure Laterality Date  . APPENDECTOMY    . CARPAL TUNNEL RELEASE    . HERNIA REPAIR     ingunial  . KNEE ARTHROSCOPY     bilateral  . SHOULDER SURGERY     left  . TOTAL KNEE ARTHROPLASTY     right    Family History  Problem Relation Age of Onset  . Cancer Father     colon ca  . Colon cancer Father   . Esophageal cancer Neg Hx   . Rectal cancer Neg Hx   . Stomach cancer Neg Hx     No Known Allergies  Current Outpatient Prescriptions on File Prior to Visit  Medication Sig Dispense Refill  . aspirin 81 MG tablet Take 162 mg by mouth daily.    Marland Kitchen atorvastatin (LIPITOR) 20 MG tablet  Take 1 tablet (20 mg total) by mouth daily. 90 tablet 3  . CIALIS 20 MG tablet TAKE 1 TABLET (20 MG TOTAL) BY MOUTH DAILY AS NEEDED FOR ERECTILE DYSFUNCTION. 6 tablet 5  . clobetasol (TEMOVATE) 0.05 % external solution APPLY TO SCALP RASH AS NEEDED  5  . Multiple Vitamin (MULTIVITAMIN) tablet Take 1 tablet by mouth daily.    . tadalafil (CIALIS) 20 MG tablet Take 1 tablet (20 mg total) by mouth daily as needed for erectile dysfunction. 6 tablet 12   No current facility-administered medications on file prior to visit.     BP (!) 178/98 (BP Location: Right Arm, Patient Position: Sitting, Cuff Size: Normal)   Pulse 84   Temp 98.7 F (37.1 C) (Oral)   Wt 160 lb 12.8 oz (72.9 kg)   SpO2 97%   BMI 23.75 kg/m      Review of Systems  Constitutional: Positive for activity change, appetite change and fatigue.  HENT: Positive for congestion, postnasal drip, rhinorrhea, sinus pain and sinus pressure.        Objective:   Physical Exam  Constitutional: He is oriented to person, place, and time.  He appears well-developed.  Blood pressure 170/95  HENT:  Head: Normocephalic.  Right Ear: External ear normal.  Left Ear: External ear normal.  Congested Bilateral maxillary sinus tenderness  Eyes: Conjunctivae and EOM are normal.  Neck: Normal range of motion.  Cardiovascular: Normal rate and normal heart sounds.   Pulmonary/Chest: Breath sounds normal.  Abdominal: Bowel sounds are normal.  Musculoskeletal: Normal range of motion. He exhibits no edema or tenderness.  Neurological: He is alert and oriented to person, place, and time.  Psychiatric: He has a normal mood and affect. His behavior is normal.          Assessment & Plan:   Subacute sinusitis.  Will treat with expectorants.  Hydration 10 days of Augmentin.  Will also treat with short-term fluticasone nasal spray  Stage II hypertension.  Will start on dual therapy.  Schedule CPX and reassess at that time  [facility]

## 2016-04-07 NOTE — Patient Instructions (Addendum)
Use saline irrigation, warm  moist compresses and over-the-counter decongestants only as directed.  Call if there is no improvement in 5 to 7 days, or sooner if you develop increasing pain, fever, or any new symptoms.  Fluticasone nasal spray daily  Follow these instructions at home: Medicines  Take, use, or apply over-the-counter and prescription medicines only as told by your health care provider. These may include nasal sprays.  If you were prescribed an antibiotic medicine, take it as told by your health care provider. Do not stop taking the antibiotic even if you start to feel better. Hydrate and Humidify  Drink enough water to keep your urine clear or pale yellow. Staying hydrated will help to thin your mucus.  Use a cool mist humidifier to keep the humidity level in your home above 50%.  Inhale steam for 10-15 minutes, 3-4 times a day or as told by your health care provider. You can do this in the bathroom while a hot shower is running.  Limit your exposure to cool or dry air. Rest  Rest as much as possible.  Sleep with your head raised (elevated).  Please check your blood pressure on a regular basis.  If it is consistently greater than 150/90, please make an office appointment.

## 2016-07-05 ENCOUNTER — Ambulatory Visit: Payer: Medicare Other | Admitting: Internal Medicine

## 2016-07-09 ENCOUNTER — Encounter: Payer: Self-pay | Admitting: Internal Medicine

## 2016-07-09 ENCOUNTER — Ambulatory Visit (INDEPENDENT_AMBULATORY_CARE_PROVIDER_SITE_OTHER): Payer: Medicare Other | Admitting: Internal Medicine

## 2016-07-09 VITALS — BP 124/68 | HR 98 | Temp 98.3°F | Ht 69.0 in | Wt 161.6 lb

## 2016-07-09 DIAGNOSIS — E785 Hyperlipidemia, unspecified: Secondary | ICD-10-CM | POA: Diagnosis not present

## 2016-07-09 DIAGNOSIS — M159 Polyosteoarthritis, unspecified: Secondary | ICD-10-CM

## 2016-07-09 DIAGNOSIS — M150 Primary generalized (osteo)arthritis: Secondary | ICD-10-CM

## 2016-07-09 DIAGNOSIS — I10 Essential (primary) hypertension: Secondary | ICD-10-CM | POA: Diagnosis not present

## 2016-07-09 DIAGNOSIS — Z0000 Encounter for general adult medical examination without abnormal findings: Secondary | ICD-10-CM | POA: Diagnosis not present

## 2016-07-09 DIAGNOSIS — Z86010 Personal history of colonic polyps: Secondary | ICD-10-CM | POA: Diagnosis not present

## 2016-07-09 LAB — LIPID PANEL
CHOLESTEROL: 184 mg/dL (ref 0–200)
HDL: 69.4 mg/dL (ref >39.00)
Total CHOL/HDL Ratio: 3

## 2016-07-09 LAB — COMPREHENSIVE METABOLIC PANEL
ALBUMIN: 4.8 g/dL (ref 3.5–5.2)
ALK PHOS: 52 U/L (ref 39–117)
ALT: 52 U/L (ref 0–53)
AST: 62 U/L — ABNORMAL HIGH (ref 0–37)
BILIRUBIN TOTAL: 0.8 mg/dL (ref 0.2–1.2)
BUN: 16 mg/dL (ref 6–23)
CALCIUM: 9.6 mg/dL (ref 8.4–10.5)
CHLORIDE: 100 meq/L (ref 96–112)
CO2: 25 mEq/L (ref 19–32)
CREATININE: 0.89 mg/dL (ref 0.40–1.50)
GFR: 90.73 mL/min (ref >60.00)
Glucose, Bld: 98 mg/dL (ref 70–99)
Potassium: 4.2 mEq/L (ref 3.5–5.1)
SODIUM: 135 meq/L (ref 135–145)
TOTAL PROTEIN: 7.5 g/dL (ref 6.0–8.3)

## 2016-07-09 LAB — CBC WITH DIFFERENTIAL/PLATELET
BASOS ABS: 0 10*3/uL (ref 0.0–0.1)
Basophils Relative: 0.5 % (ref 0.0–3.0)
EOS ABS: 0.1 10*3/uL (ref 0.0–0.7)
Eosinophils Relative: 1.6 % (ref 0.0–5.0)
HCT: 39.2 % (ref 39.0–52.0)
HEMOGLOBIN: 13.9 g/dL (ref 13.0–17.0)
LYMPHS PCT: 31.1 % (ref 12.0–46.0)
Lymphs Abs: 1.9 10*3/uL (ref 0.7–4.0)
MCHC: 35.4 g/dL (ref 30.0–36.0)
MCV: 93.7 fl (ref 78.0–100.0)
MONO ABS: 0.7 10*3/uL (ref 0.1–1.0)
Monocytes Relative: 12.2 % — ABNORMAL HIGH (ref 3.0–12.0)
Neutro Abs: 3.3 10*3/uL (ref 1.4–7.7)
Neutrophils Relative %: 54.6 % (ref 43.0–77.0)
Platelets: 222 10*3/uL (ref 150.0–400.0)
RBC: 4.18 Mil/uL — AB (ref 4.22–5.81)
RDW: 12.8 % (ref 11.5–15.5)
WBC: 6 10*3/uL (ref 4.0–10.5)

## 2016-07-09 LAB — TSH: TSH: 0.84 u[IU]/mL (ref 0.35–4.50)

## 2016-07-09 LAB — LDL CHOLESTEROL, DIRECT: LDL DIRECT: 76 mg/dL

## 2016-07-09 NOTE — Patient Instructions (Signed)
Limit your sodium (Salt) intake  Please check your blood pressure on a regular basis.  If it is consistently greater than 150/90, please make an office appointment.    It is important that you exercise regularly, at least 20 minutes 3 to 4 times per week.  If you develop chest pain or shortness of breath seek  medical attention.  Return in one year for follow-up  

## 2016-07-09 NOTE — Progress Notes (Signed)
Subjective:    Patient ID: Timothy [name], male    DOB: 12/04/1949, 66 y.o.   MRN: [id number]  HPI  66 year old patient who is seen today for follow-up of hypertension, for annual exam and Medicare wellness visit He has done quite well on combination therapy.  He checks blood pressures daily with readings in a low-normal range.  He feels well.  No symptoms of hypotension  He has a history colonic polyps and has had fairly recent follow-up colonoscopy.  He has dyslipidemia. No cardiopulmonary complaints  Past Medical History:  Diagnosis Date  . COLONIC POLYPS, HX OF 02/12/2009  . HYPERLIPIDEMIA 11/21/2007  . 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 OSTEOARTHRITIS 01/17/2007     Social History   Social History  . Marital status: Married    Spouse name: N/A  . Number of children: N/A  . Years of education: N/A   Occupational History  . Not on file.   Social History Main Topics  . Smoking status: Never Smoker  . Smokeless tobacco: Current User    Types: Chew  . Alcohol use 3.6 - 4.8 oz/week    6 - 8 Cans of beer per week  . Drug use: No  . Sexual activity: Not on file   Other Topics Concern  . Not on file   Social History Narrative  . No narrative on file    Past Surgical History:  Procedure Laterality Date  . APPENDECTOMY    . CARPAL TUNNEL RELEASE    . HERNIA REPAIR     ingunial  . KNEE ARTHROSCOPY     bilateral  . SHOULDER SURGERY     left  . TOTAL KNEE ARTHROPLASTY     right    Family History  Problem Relation Age of Onset  . Cancer Father        colon ca  . Colon cancer Father   . Esophageal cancer Neg Hx   . Rectal cancer Neg Hx   . Stomach cancer Neg Hx     No Known Allergies  Current Outpatient Prescriptions on File Prior to Visit  Medication Sig Dispense Refill  . aspirin 81 MG tablet Take 162 mg by mouth daily.    Marland Kitchen atorvastatin (LIPITOR) 20 MG tablet Take 1 tablet (20 mg total) by mouth daily. 90 tablet 3  . CIALIS 20 MG tablet  TAKE 1 TABLET (20 MG TOTAL) BY MOUTH DAILY AS NEEDED FOR ERECTILE DYSFUNCTION. 6 tablet 5  . clobetasol (TEMOVATE) 0.05 % external solution APPLY TO SCALP RASH AS NEEDED  5  . fluticasone (FLONASE) 50 MCG/ACT nasal spray Place 2 sprays into both nostrils daily. 16 g 6  . lisinopril-hydrochlorothiazide (PRINZIDE,ZESTORETIC) 20-25 MG tablet Take 1 tablet by mouth daily. 90 tablet 3  . Multiple Vitamin (MULTIVITAMIN) tablet Take 1 tablet by mouth daily.    . tadalafil (CIALIS) 20 MG tablet Take 1 tablet (20 mg total) by mouth daily as needed for erectile dysfunction. 6 tablet 12   No current facility-administered medications on file prior to visit.     BP 124/68 (BP Location: Left Arm, Patient Position: Sitting, Cuff Size: Normal)   Pulse 98   Temp 98.3 F (36.8 C) (Oral)   Ht 5\' 9"  (1.753 m)   Wt 161 lb 9.6 oz (73.3 kg)   SpO2 98%   BMI 23.86 kg/m   Medicare wellness visit  1. Risk factors, based on past  M,S,F history.  Current vascular risk factors include hypertension and dyslipidemia.  2.  Physical activities: remains quite active.  No exercise limitations.  Continues to work full time  3.  Depression/mood:no history of major depression or mood disorder  4.  Hearing:no deficits  5.  ADL's:independent  6.  Fall risk:low  7.  Home safety:no problems identified  8.  Height weight, and visual acuity;height and weight stable no change in visual acuity  9.  Counseling:continue heart healthy diet and regular exercise  10. Lab orders based on risk factors:laboratory update will be reviewed, including lipid profile  11. Referral :not appropriate at this time  12. Care plan:continue efforts at aggressive risk factor modification  13. Cognitive assessment: alert in order with normal affect.  No cognitive dysfunction  14. Screening: Patient provided with a written and personalized 5-10 year screening schedule in the AVS.    15. Provider List Update: primary care and GI as well  as ophthalmology    Review of Systems  Constitutional: Negative for appetite change, chills, fatigue and fever.  HENT: Negative for congestion, dental problem, ear pain, hearing loss, sore throat, tinnitus, trouble swallowing and voice change.   Eyes: Negative for pain, discharge and visual disturbance.  Respiratory: Negative for cough, chest tightness, wheezing and stridor.   Cardiovascular: Negative for chest pain, palpitations and leg swelling.  Gastrointestinal: Negative for abdominal distention, abdominal pain, blood in stool, constipation, diarrhea, nausea and vomiting.  Genitourinary: Negative for difficulty urinating, discharge, flank pain, genital sores, hematuria and urgency.  Musculoskeletal: Negative for arthralgias, back pain, gait problem, joint swelling, myalgias and neck stiffness.  Skin: Negative for rash.  Neurological: Negative for dizziness, syncope, speech difficulty, weakness, numbness and headaches.  Hematological: Negative for adenopathy. Does not bruise/bleed easily.  Psychiatric/Behavioral: Negative for behavioral problems and dysphoric mood. The patient is not nervous/anxious.        Objective:   Physical Exam  Constitutional: He appears well-developed and well-nourished.  Blood pressure 130/70  HENT:  Head: Normocephalic and atraumatic.  Right Ear: External ear normal.  Left Ear: External ear normal.  Nose: Nose normal.  Mouth/Throat: Oropharynx is clear and moist.  Eyes: Conjunctivae and EOM are normal. Pupils are equal, round, and reactive to light. No scleral icterus.  Neck: Normal range of motion. Neck supple. No JVD present. No thyromegaly present.  Cardiovascular: Regular rhythm, normal heart sounds and intact distal pulses.  Exam reveals no gallop and no friction rub.   No murmur heard. Pulmonary/Chest: Effort normal and breath sounds normal. He exhibits no tenderness.  Abdominal: Soft. Bowel sounds are normal. He exhibits no distension and no  mass. There is no tenderness.  Genitourinary: Prostate normal and penis normal. Rectal exam shows guaiac negative stool.  Musculoskeletal: Normal range of motion. He exhibits no edema or tenderness.  Scar right knee  Lymphadenopathy:    He has no cervical adenopathy.  Neurological: He is alert. He has normal reflexes. No cranial nerve deficit. Coordination normal.  Skin: Skin is warm and dry. No rash noted.  Psychiatric: He has a normal mood and affect. His behavior is normal.          Assessment & Plan:   Essential hypertension, well-controlled Dyslipidemia.  Continue statin therapy History colonic polyps Medicare wellness visit  Continue home blood pressure monitoring and active lifestyle Continue low-salt diet  Follow-up one year or as needed  Nyoka Cowden

## 2016-07-19 ENCOUNTER — Telehealth: Payer: Self-pay | Admitting: Internal Medicine

## 2016-07-19 MED ORDER — LISINOPRIL 20 MG PO TABS: 20.0000 mg | ORAL_TABLET | Freq: Every day | ORAL | 0 refills | Status: DC

## 2016-07-19 NOTE — Telephone Encounter (Signed)
Please have Timothy [name] discontinue lisinopril/hydrochlorothiazide. Please call in a new prescription for lisinopril 20 mg #90 one tablet daily

## 2016-07-19 NOTE — Telephone Encounter (Signed)
Please see message below, please advise 

## 2016-07-19 NOTE — Telephone Encounter (Signed)
Patient's BP was 111/59 yesterday after coming home from the park.  Today patient came home from work and took BP and it was 105/57.  Both times the patient was light headed.  Patient's wife called in today to advise of this as Dr. Raliegh Ip told patient last week during Wellness visit to keep an eye on his BP since there has not been any changes in his medication.  Dr. Raliegh Ip advised that he "didn't want the blood pressure too low" and wife feels this is low.  Patient is wondering if he needs to adjust the Blood pressure medication.

## 2016-07-19 NOTE — Telephone Encounter (Signed)
Left a deatiled message on pt's voicemail to call office back. Also stated to discontinue lisinopril/hydrochlorothiazide. And that a new RX was called in  for lisinopril 20 mg #90 one tablet daily.

## 2016-07-20 NOTE — Telephone Encounter (Signed)
Left a deatiled message on pt's voicemail to discontinue lisinopril/hydrochlorothiazide. And that a new RX was called in  for lisinopril 20 mg #90 one tablet daily. Also stated for pt to return call to office.

## 2016-11-18 ENCOUNTER — Encounter: Payer: Self-pay | Admitting: Internal Medicine

## 2016-12-01 ENCOUNTER — Telehealth: Payer: Self-pay | Admitting: Internal Medicine

## 2016-12-01 NOTE — Telephone Encounter (Addendum)
Patient received a reminder on mychart is time for his pneumonia vaccine. Please confirm

## 2016-12-07 NOTE — Telephone Encounter (Signed)
Please advise 

## 2016-12-07 NOTE — Telephone Encounter (Signed)
Yes.  Patient has had Prevnar.  Needs pneumococcal vaccine at his convenience

## 2016-12-08 NOTE — Telephone Encounter (Signed)
Called pt but his voice mail is full and I was unable to leave a voicemail.

## 2016-12-17 ENCOUNTER — Ambulatory Visit (INDEPENDENT_AMBULATORY_CARE_PROVIDER_SITE_OTHER): Payer: Medicare Other

## 2016-12-17 DIAGNOSIS — Z23 Encounter for immunization: Secondary | ICD-10-CM | POA: Diagnosis not present

## 2016-12-23 ENCOUNTER — Other Ambulatory Visit: Payer: Self-pay | Admitting: Internal Medicine

## 2017-02-16 ENCOUNTER — Encounter: Payer: Self-pay | Admitting: Internal Medicine

## 2017-02-28 ENCOUNTER — Other Ambulatory Visit: Payer: Self-pay | Admitting: Internal Medicine

## 2017-02-28 MED ORDER — LISINOPRIL 20 MG PO TABS: 20.0000 mg | ORAL_TABLET | Freq: Every day | ORAL | 4 refills | Status: DC

## 2017-04-01 DIAGNOSIS — D1801 Hemangioma of skin and subcutaneous tissue: Secondary | ICD-10-CM | POA: Diagnosis not present

## 2017-04-01 DIAGNOSIS — L812 Freckles: Secondary | ICD-10-CM | POA: Diagnosis not present

## 2017-04-01 DIAGNOSIS — L308 Other specified dermatitis: Secondary | ICD-10-CM | POA: Diagnosis not present

## 2017-04-01 DIAGNOSIS — L218 Other seborrheic dermatitis: Secondary | ICD-10-CM | POA: Diagnosis not present

## 2017-04-01 DIAGNOSIS — L821 Other seborrheic keratosis: Secondary | ICD-10-CM | POA: Diagnosis not present

## 2017-07-14 ENCOUNTER — Ambulatory Visit (INDEPENDENT_AMBULATORY_CARE_PROVIDER_SITE_OTHER): Payer: Medicare Other | Admitting: Internal Medicine

## 2017-07-14 ENCOUNTER — Encounter: Payer: Self-pay | Admitting: Internal Medicine

## 2017-07-14 VITALS — BP 140/60 | HR 104 | Temp 98.7°F | Wt 161.0 lb

## 2017-07-14 DIAGNOSIS — E785 Hyperlipidemia, unspecified: Secondary | ICD-10-CM

## 2017-07-14 DIAGNOSIS — I10 Essential (primary) hypertension: Secondary | ICD-10-CM

## 2017-07-14 DIAGNOSIS — R079 Chest pain, unspecified: Secondary | ICD-10-CM

## 2017-07-14 LAB — TSH: TSH: 0.81 u[IU]/mL (ref 0.35–4.50)

## 2017-07-14 LAB — COMPREHENSIVE METABOLIC PANEL
ALK PHOS: 46 U/L (ref 39–117)
ALT: 39 U/L (ref 0–53)
AST: 53 U/L — AB (ref 0–37)
Albumin: 4.5 g/dL (ref 3.5–5.2)
BILIRUBIN TOTAL: 1.4 mg/dL — AB (ref 0.2–1.2)
BUN: 8 mg/dL (ref 6–23)
CO2: 29 meq/L (ref 19–32)
CREATININE: 0.86 mg/dL (ref 0.40–1.50)
Calcium: 9.6 mg/dL (ref 8.4–10.5)
Chloride: 97 mEq/L (ref 96–112)
GFR: 94.1 mL/min (ref >60.00)
GLUCOSE: 97 mg/dL (ref 70–99)
Potassium: 4.5 mEq/L (ref 3.5–5.1)
Sodium: 134 mEq/L — ABNORMAL LOW (ref 135–145)
TOTAL PROTEIN: 7.4 g/dL (ref 6.0–8.3)

## 2017-07-14 LAB — LIPID PANEL
CHOL/HDL RATIO: 2
Cholesterol: 151 mg/dL (ref 0–200)
HDL: 70.1 mg/dL (ref >39.00)
LDL Cholesterol: 63 mg/dL (ref 0–99)
NONHDL: 81.18
Triglycerides: 90 mg/dL (ref 0.0–149.0)
VLDL: 18 mg/dL (ref 0.0–40.0)

## 2017-07-14 LAB — CBC WITH DIFFERENTIAL/PLATELET
BASOS ABS: 0 10*3/uL (ref 0.0–0.1)
Basophils Relative: 0.4 % (ref 0.0–3.0)
EOS PCT: 0.7 % (ref 0.0–5.0)
Eosinophils Absolute: 0.1 10*3/uL (ref 0.0–0.7)
HEMATOCRIT: 45.8 % (ref 39.0–52.0)
Hemoglobin: 15.8 g/dL (ref 13.0–17.0)
LYMPHS ABS: 1.4 10*3/uL (ref 0.7–4.0)
LYMPHS PCT: 18.9 % (ref 12.0–46.0)
MCHC: 34.5 g/dL (ref 30.0–36.0)
MCV: 96.9 fl (ref 78.0–100.0)
Monocytes Absolute: 0.9 10*3/uL (ref 0.1–1.0)
Monocytes Relative: 12.4 % — ABNORMAL HIGH (ref 3.0–12.0)
NEUTROS ABS: 5 10*3/uL (ref 1.4–7.7)
NEUTROS PCT: 67.6 % (ref 43.0–77.0)
PLATELETS: 198 10*3/uL (ref 150.0–400.0)
RBC: 4.73 Mil/uL (ref 4.22–5.81)
RDW: 13.1 % (ref 11.5–15.5)
WBC: 7.4 10*3/uL (ref 4.0–10.5)

## 2017-07-14 MED ORDER — PANTOPRAZOLE SODIUM 40 MG PO TBEC: 40.0000 mg | DELAYED_RELEASE_TABLET | Freq: Two times a day (BID) | ORAL | 3 refills | Status: DC

## 2017-07-14 NOTE — Progress Notes (Signed)
Subjective:    Patient ID: Timothy [name], male    DOB: 12/04/1949, 67 y.o.   MRN: [id number]  HPI 67 year old patient who is seen today with a 3-day history of epigastric pain.  He states that he feels this is significant heartburn.  He has had very mild infrequent heartburn issues over the years and this pain seems very similar except much more severe.  He describes a burning epigastric pain that began Monday afternoon.  Associated symptoms include belching.  He has had some anorexia pain is aggravated by eating.  He states that when he forces himself to eat this often triggers nausea and retching but no emesis.  Pain has been alleviated by Tums and he feels better when he stands and walks.  Last night he had a fairly comfortable night but the night before he was very restless with discomfort throughout the night.  There is some mild radiation of the epigastric discomfort to the back  Past Medical History:  Diagnosis Date  . COLONIC POLYPS, HX OF 02/12/2009  . HYPERLIPIDEMIA 11/21/2007  . 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 OSTEOARTHRITIS 01/17/2007     Social History   Socioeconomic History  . Marital status: Married    Spouse name: Not on file  . Number of children: Not on file  . Years of education: Not on file  . Highest education level: Not on file  Occupational History  . Not on file  Social Needs  . Financial resource strain: Not on file  . Food insecurity:    Worry: Not on file    Inability: Not on file  . Transportation needs:    Medical: Not on file    Non-medical: Not on file  Tobacco Use  . Smoking status: Never Smoker  . Smokeless tobacco: Current User    Types: Chew  Substance and Sexual Activity  . Alcohol use: Yes    Alcohol/week: 3.6 - 4.8 oz    Types: 6 - 8 Cans of beer per week  . Drug use: No  . Sexual activity: Not on file  Lifestyle  . Physical activity:    Days per week: Not on file    Minutes per session: Not on file  . Stress: Not on  file  Relationships  . Social connections:    Talks on phone: Not on file    Gets together: Not on file    Attends religious service: Not on file    Active member of club or organization: Not on file    Attends meetings of clubs or organizations: Not on file    Relationship status: Not on file  . Intimate partner violence:    Fear of current or ex partner: Not on file    Emotionally abused: Not on file    Physically abused: Not on file    Forced sexual activity: Not on file  Other Topics Concern  . Not on file  Social History Narrative  . Not on file    Past Surgical History:  Procedure Laterality Date  . APPENDECTOMY    . CARPAL TUNNEL RELEASE    . HERNIA REPAIR     ingunial  . KNEE ARTHROSCOPY     bilateral  . SHOULDER SURGERY     left  . TOTAL KNEE ARTHROPLASTY     right    Family History  Problem Relation Age of Onset  . Cancer Father        colon ca  . Colon cancer  Father   . Esophageal cancer Neg Hx   . Rectal cancer Neg Hx   . Stomach cancer Neg Hx     No Known Allergies  Current Outpatient Medications on File Prior to Visit  Medication Sig Dispense Refill  . aspirin 81 MG tablet Take 162 mg by mouth daily.    Marland Kitchen atorvastatin (LIPITOR) 20 MG tablet TAKE 1 TABLET (20 MG TOTAL) BY MOUTH DAILY. 90 tablet 2  . CIALIS 20 MG tablet TAKE 1 TABLET (20 MG TOTAL) BY MOUTH DAILY AS NEEDED FOR ERECTILE DYSFUNCTION. 6 tablet 5  . clobetasol (TEMOVATE) 0.05 % external solution APPLY TO SCALP RASH AS NEEDED  5  . fluticasone (FLONASE) 50 MCG/ACT nasal spray Place 2 sprays into both nostrils daily. 16 g 6  . lisinopril (PRINIVIL,ZESTRIL) 20 MG tablet Take 1 tablet (20 mg total) by mouth daily. 90 tablet 4  . Multiple Vitamin (MULTIVITAMIN) tablet Take 1 tablet by mouth daily.    . tadalafil (CIALIS) 20 MG tablet Take 1 tablet (20 mg total) by mouth daily as needed for erectile dysfunction. 6 tablet 12   No current facility-administered medications on file prior to  visit.     BP 140/60 (BP Location: Right Arm, Patient Position: Sitting, Cuff Size: Large)   Pulse (!) 104   Temp 98.7 F (37.1 C) (Oral)   Wt 161 lb (73 kg)   SpO2 97%   BMI 23.78 kg/m      Review of Systems  Constitutional: Negative for appetite change, chills, fatigue and fever.  HENT: Negative for congestion, dental problem, ear pain, hearing loss, sore throat, tinnitus, trouble swallowing and voice change.   Eyes: Negative for pain, discharge and visual disturbance.  Respiratory: Negative for cough, chest tightness, wheezing and stridor.   Cardiovascular: Negative for chest pain, palpitations and leg swelling.  Gastrointestinal: Positive for abdominal pain, nausea and vomiting. Negative for abdominal distention, blood in stool, constipation and diarrhea.  Genitourinary: Negative for difficulty urinating, discharge, flank pain, genital sores, hematuria and urgency.  Musculoskeletal: Negative for arthralgias, back pain, gait problem, joint swelling, myalgias and neck stiffness.  Skin: Negative for rash.  Neurological: Negative for dizziness, syncope, speech difficulty, weakness, numbness and headaches.  Hematological: Negative for adenopathy. Does not bruise/bleed easily.  Psychiatric/Behavioral: Negative for behavioral problems and dysphoric mood. The patient is not nervous/anxious.        Objective:   Physical Exam  Constitutional: He is oriented to person, place, and time. He appears well-developed. No distress.  HENT:  Head: Normocephalic.  Right Ear: External ear normal.  Left Ear: External ear normal.  Eyes: Conjunctivae and EOM are normal.  Neck: Normal range of motion.  Cardiovascular: Normal rate, regular rhythm and normal heart sounds.  Pulmonary/Chest: Breath sounds normal. No stridor. No respiratory distress. He has no rales.  Abdominal: Soft. Bowel sounds are normal.  Suggestion of very mild epigastric discomfort  Musculoskeletal: Normal range of motion. He  exhibits no edema or tenderness.  Neurological: He is alert and oriented to person, place, and time.  Psychiatric: He has a normal mood and affect. His behavior is normal.          Assessment & Plan:   Epigastric pain.  Sounds like GERD.  Will place on a aggressive anti-GERD regimen with better diet and start PPI therapy.  Review EKG for completeness.  Patient is scheduled for his annual exam next week we will reassess at that time  [facility]

## 2017-07-14 NOTE — Patient Instructions (Addendum)
Avoids foods high in acid such as tomatoes citrus juices, and spicy foods.  Avoid eating within two hours of lying down or before exercising.  Do not overheat.  Try smaller more frequent meals.  If symptoms persist, elevate the head of her bed 12 inches while sleeping.   Food Choices for Gastroesophageal Reflux Disease, Adult When you have gastroesophageal reflux disease (GERD), the foods you eat and your eating habits are very important. Choosing the right foods can help ease the discomfort of GERD. Consider working with a diet and nutrition specialist (dietitian) to help you make healthy food choices. What general guidelines should I follow? Eating plan  Choose healthy foods low in fat, such as fruits, vegetables, whole grains, low-fat dairy products, and lean meat, fish, and poultry.  Eat frequent, small meals instead of three large meals each day. Eat your meals slowly, in a relaxed setting. Avoid bending over or lying down until 2-3 hours after eating.  Limit high-fat foods such as fatty meats or fried foods.  Limit your intake of oils, butter, and shortening to less than 8 teaspoons each day.  Avoid the following: ? Foods that cause symptoms. These may be different for different people. Keep a food diary to keep track of foods that cause symptoms. ? Alcohol. ? Drinking large amounts of liquid with meals. ? Eating meals during the 2-3 hours before bed.  Cook foods using methods other than frying. This may include baking, grilling, or broiling. Lifestyle   Maintain a healthy weight. Ask your health care provider what weight is healthy for you. If you need to lose weight, work with your health care provider to do so safely.  Exercise for at least 30 minutes on 5 or more days each week, or as told by your health care provider.  Avoid wearing clothes that fit tightly around your waist and chest.  Do not use any products that contain nicotine or tobacco, such as cigarettes and  e-cigarettes. If you need help quitting, ask your health care provider.  Sleep with the head of your bed raised. Use a wedge under the mattress or blocks under the bed frame to raise the head of the bed. What foods are not recommended? The items listed may not be a complete list. Talk with your dietitian about what dietary choices are best for you. Grains Pastries or quick breads with added fat. [place] toast. Vegetables Deep fried vegetables. [place] fries. Any vegetables prepared with added fat. Any vegetables that cause symptoms. For some people this may include tomatoes and tomato products, chili peppers, onions and garlic, and horseradish. Fruits Any fruits prepared with added fat. Any fruits that cause symptoms. For some people this may include citrus fruits, such as oranges, grapefruit, pineapple, and lemons. Meats and other protein foods High-fat meats, such as fatty beef or pork, hot dogs, ribs, ham, sausage, salami and bacon. Fried meat or protein, including fried fish and fried chicken. Nuts and nut butters. Dairy Whole milk and chocolate milk. Sour cream. Cream. Ice cream. Cream cheese. Milk shakes. Beverages Coffee and tea, with or without caffeine. Carbonated beverages. Sodas. Energy drinks. Fruit juice made with acidic fruits (such as orange or grapefruit). Tomato juice. Alcoholic drinks. Fats and oils Butter. Margarine. Shortening. Ghee. Sweets and desserts Chocolate and cocoa. Donuts. Seasoning and other foods Pepper. Peppermint and spearmint. Any condiments, herbs, or seasonings that cause symptoms. For some people, this may include curry, hot sauce, or vinegar-based salad dressings. Summary  When you have gastroesophageal  reflux disease (GERD), food and lifestyle choices are very important to help ease the discomfort of GERD.  Eat frequent, small meals instead of three large meals each day. Eat your meals slowly, in a relaxed setting. Avoid bending over or lying down until  2-3 hours after eating.  Limit high-fat foods such as fatty meat or fried foods. This information is not intended to replace advice given to you by your health care provider. Make sure you discuss any questions you have with your health care provider. Document Released: 02/15/2005 Document Revised: 02/17/2016 Document Reviewed: 02/17/2016 Elsevier Interactive Patient Education  2018 [employer].  Gastroesophageal Reflux Disease, Adult Normally, food travels down the esophagus and stays in the stomach to be digested. However, when a person has gastroesophageal reflux disease (GERD), food and stomach acid move back up into the esophagus. When this happens, the esophagus becomes sore and inflamed. Over time, GERD can create small holes (ulcers) in the lining of the esophagus. What are the causes? This condition is caused by a problem with the muscle between the esophagus and the stomach (lower esophageal sphincter, or LES). Normally, the LES muscle closes after food passes through the esophagus to the stomach. When the LES is weakened or abnormal, it does not close properly, and that allows food and stomach acid to go back up into the esophagus. The LES can be weakened by certain dietary substances, medicines, and medical conditions, including:  Tobacco use.  Pregnancy.  Having a hiatal hernia.  Heavy alcohol use.  Certain foods and beverages, such as coffee, chocolate, onions, and peppermint.  What increases the risk? This condition is more likely to develop in:  People who have an increased body weight.  People who have connective tissue disorders.  People who use NSAID medicines.  What are the signs or symptoms? Symptoms of this condition include:  Heartburn.  Difficult or painful swallowing.  The feeling of having a lump in the throat.  Abitter taste in the mouth.  Bad breath.  Having a large amount of saliva.  Having an upset or bloated  stomach.  Belching.  Chest pain.  Shortness of breath or wheezing.  Ongoing (chronic) cough or a night-time cough.  Wearing away of tooth enamel.  Weight loss.  Different conditions can cause chest pain. Make sure to see your health care provider if you experience chest pain. How is this diagnosed? Your health care provider will take a medical history and perform a physical exam. To determine if you have mild or severe GERD, your health care provider may also monitor how you respond to treatment. You may also have other tests, including:  An endoscopy toexamine your stomach and esophagus with a small camera.  A test thatmeasures the acidity level in your esophagus.  A test thatmeasures how much pressure is on your esophagus.  A barium swallow or modified barium swallow to show the shape, size, and functioning of your esophagus.  How is this treated? The goal of treatment is to help relieve your symptoms and to prevent complications. Treatment for this condition may vary depending on how severe your symptoms are. Your health care provider may recommend:  Changes to your diet.  Medicine.  Surgery.  Follow these instructions at home: Diet  Follow a diet as recommended by your health care provider. This may involve avoiding foods and drinks such as: ? Coffee and tea (with or without caffeine). ? Drinks that containalcohol. ? Energy drinks and sports drinks. ? Carbonated drinks or  sodas. ? Chocolate and cocoa. ? Peppermint and mint flavorings. ? Garlic and onions. ? Horseradish. ? Spicy and acidic foods, including peppers, chili powder, curry powder, vinegar, hot sauces, and barbecue sauce. ? Citrus fruit juices and citrus fruits, such as oranges, lemons, and limes. ? Tomato-based foods, such as red sauce, chili, salsa, and pizza with red sauce. ? Fried and fatty foods, such as donuts, french fries, potato chips, and high-fat dressings. ? High-fat meats, such as hot  dogs and fatty cuts of red and white meats, such as rib eye steak, sausage, ham, and bacon. ? High-fat dairy items, such as whole milk, butter, and cream cheese.  Eat small, frequent meals instead of large meals.  Avoid drinking large amounts of liquid with your meals.  Avoid eating meals during the 2-3 hours before bedtime.  Avoid lying down right after you eat.  Do not exercise right after you eat. General instructions  Pay attention to any changes in your symptoms.  Take over-the-counter and prescription medicines only as told by your health care provider. Do not take aspirin, ibuprofen, or other NSAIDs unless your health care provider told you to do so.  Do not use any tobacco products, including cigarettes, chewing tobacco, and e-cigarettes. If you need help quitting, ask your health care provider.  Wear loose-fitting clothing. Do not wear anything tight around your waist that causes pressure on your abdomen.  Raise (elevate) the head of your bed 6 inches (15cm).  Try to reduce your stress, such as with yoga or meditation. If you need help reducing stress, ask your health care provider.  If you are overweight, reduce your weight to an amount that is healthy for you. Ask your health care provider for guidance about a safe weight loss goal.  Keep all follow-up visits as told by your health care provider. This is important. Contact a health care provider if:  You have new symptoms.  You have unexplained weight loss.  You have difficulty swallowing, or it hurts to swallow.  You have wheezing or a persistent cough.  Your symptoms do not improve with treatment.  You have a hoarse voice. Get help right away if:  You have pain in your arms, neck, jaw, teeth, or back.  You feel sweaty, dizzy, or light-headed.  You have chest pain or shortness of breath.  You vomit and your vomit looks like blood or coffee grounds.  You faint.  Your stool is bloody or black.  You  cannot swallow, drink, or eat. This information is not intended to replace advice given to you by your health care provider. Make sure you discuss any questions you have with your health care provider. Document Released: 11/25/2004 Document Revised: 07/16/2015 Document Reviewed: 06/12/2014 Elsevier Interactive Patient Education  2018 [place] in 7 days as scheduled

## 2017-07-21 ENCOUNTER — Encounter: Payer: Self-pay | Admitting: Internal Medicine

## 2017-07-21 ENCOUNTER — Ambulatory Visit (INDEPENDENT_AMBULATORY_CARE_PROVIDER_SITE_OTHER): Payer: Medicare Other | Admitting: Internal Medicine

## 2017-07-21 VITALS — BP 148/72 | HR 98 | Temp 99.0°F | Ht 69.0 in | Wt 165.0 lb

## 2017-07-21 DIAGNOSIS — M159 Polyosteoarthritis, unspecified: Secondary | ICD-10-CM

## 2017-07-21 DIAGNOSIS — E785 Hyperlipidemia, unspecified: Secondary | ICD-10-CM | POA: Diagnosis not present

## 2017-07-21 DIAGNOSIS — Z860100 Personal history of colon polyps, unspecified: Secondary | ICD-10-CM

## 2017-07-21 DIAGNOSIS — M150 Primary generalized (osteo)arthritis: Secondary | ICD-10-CM

## 2017-07-21 DIAGNOSIS — I10 Essential (primary) hypertension: Secondary | ICD-10-CM | POA: Diagnosis not present

## 2017-07-21 NOTE — Patient Instructions (Signed)
Limit your sodium (Salt) intake  Please check your blood pressure on a regular basis.  If it is consistently greater than 150/90, please make an office appointment.    It is important that you exercise regularly, at least 20 minutes 3 to 4 times per week.  If you develop chest pain or shortness of breath seek  medical attention.  Return in one year for follow-up  Schedule your colonoscopy to help detect colon cancer. 

## 2017-07-21 NOTE — Progress Notes (Signed)
Subjective:    Patient ID: Timothy [name], male    DOB: 12/04/1949, 67 y.o.   MRN: [id number]  HPI  67 year old patient who is seen today for an annual exam and subsequent Medicare wellness visit He does quite well he has a history of treated hypertension as well as dyslipidemia. He is scheduled for follow-up 5-year colonoscopy later this year.  He has already been contacted by Dr. Blanch Media office. Doing quite well.  He was seen last week with severe dyspepsia that has resolved.  He stopped taking PPI therapy. He does monitor home blood pressure readings and systolic and diastolics readings are generally in the 130s in the low to mid 70s  No concerns or complaints  Family history mother died fairly recently at 58 Father died at 33 of complications of colon cancer Father did have an MI and coronary artery bypass grafting at age 59 No brothers 3 sisters  Past Medical History:  Diagnosis Date  . COLONIC POLYPS, HX OF 02/12/2009  . HYPERLIPIDEMIA 11/21/2007  . 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 OSTEOARTHRITIS 01/17/2007     Social History   Socioeconomic History  . Marital status: Married    Spouse name: Not on file  . Number of children: Not on file  . Years of education: Not on file  . Highest education level: Not on file  Occupational History  . Not on file  Social Needs  . Financial resource strain: Not on file  . Food insecurity:    Worry: Not on file    Inability: Not on file  . Transportation needs:    Medical: Not on file    Non-medical: Not on file  Tobacco Use  . Smoking status: Never Smoker  . Smokeless tobacco: Current User    Types: Chew  Substance and Sexual Activity  . Alcohol use: Yes    Alcohol/week: 3.6 - 4.8 oz    Types: 6 - 8 Cans of beer per week  . Drug use: No  . Sexual activity: Not on file  Lifestyle  . Physical activity:    Days per week: Not on file    Minutes per session: Not on file  . Stress: Not on file  Relationships  .  Social connections:    Talks on phone: Not on file    Gets together: Not on file    Attends religious service: Not on file    Active member of club or organization: Not on file    Attends meetings of clubs or organizations: Not on file    Relationship status: Not on file  . Intimate partner violence:    Fear of current or ex partner: Not on file    Emotionally abused: Not on file    Physically abused: Not on file    Forced sexual activity: Not on file  Other Topics Concern  . Not on file  Social History Narrative  . Not on file    Past Surgical History:  Procedure Laterality Date  . APPENDECTOMY    . CARPAL TUNNEL RELEASE    . HERNIA REPAIR     ingunial  . KNEE ARTHROSCOPY     bilateral  . SHOULDER SURGERY     left  . TOTAL KNEE ARTHROPLASTY     right    Family History  Problem Relation Age of Onset  . Cancer Father        colon ca  . Colon cancer Father   . Esophageal cancer Neg  Hx   . Rectal cancer Neg Hx   . Stomach cancer Neg Hx     No Known Allergies  Current Outpatient Medications on File Prior to Visit  Medication Sig Dispense Refill  . aspirin 81 MG tablet Take 162 mg by mouth daily.    Marland Kitchen atorvastatin (LIPITOR) 20 MG tablet TAKE 1 TABLET (20 MG TOTAL) BY MOUTH DAILY. 90 tablet 2  . CIALIS 20 MG tablet TAKE 1 TABLET (20 MG TOTAL) BY MOUTH DAILY AS NEEDED FOR ERECTILE DYSFUNCTION. 6 tablet 5  . lisinopril (PRINIVIL,ZESTRIL) 20 MG tablet Take 1 tablet (20 mg total) by mouth daily. 90 tablet 4  . Multiple Vitamin (MULTIVITAMIN) tablet Take 1 tablet by mouth daily.    . pantoprazole (PROTONIX) 40 MG tablet Take 1 tablet (40 mg total) by mouth 2 (two) times daily. 60 tablet 3  . clobetasol (TEMOVATE) 0.05 % external solution APPLY TO SCALP RASH AS NEEDED  5  . fluticasone (FLONASE) 50 MCG/ACT nasal spray Place 2 sprays into both nostrils daily. (Patient not taking: Reported on 07/21/2017) 16 g 6  . tadalafil (CIALIS) 20 MG tablet Take 1 tablet (20 mg total) by  mouth daily as needed for erectile dysfunction. (Patient not taking: Reported on 07/21/2017) 6 tablet 12   No current facility-administered medications on file prior to visit.     BP (!) 148/72   Pulse 98   Temp 99 F (37.2 C)   Ht 5\' 9"  (1.753 m)   Wt 165 lb (74.8 kg)   BMI 24.37 kg/m   Subsequent Medicare wellness visit  1. Risk factors, based on past  M,S,F history.  Cardiovascular risk factors include hypertension and dyslipidemia  2.  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  Very active physically.  Does get to his health club 2-3 times per week  3.  Depression/mood: No history major depression or mood disorder  4.  Hearing: No deficits  5.  ADL's: Independent  6.  Fall risk: Low  7.  Home safety: No problems identified  8.  Height weight, and visual acuity; height and weight stable scheduled for elective cataract extraction surgery next week  9.  Counseling: Follow-up colonoscopy later this year as planned  35. Lab orders based on risk factors: Recent laboratory studies from last week reviewed  11. Referral : Follow-up colonoscopy  12. Care plan: Continue efforts at aggressive risk factor modification  13. Cognitive assessment: Alert and appropriate normal affect.  No cognitive dysfunction  14. Screening: Patient provided with a written and personalized 5-10 year screening schedule in the AVS.    15. Provider List Update: Ophthalmology GI primary care    Review of Systems  Constitutional: Negative for appetite change, chills, fatigue and fever.  HENT: Negative for congestion, dental problem, ear pain, hearing loss, sore throat, tinnitus, trouble swallowing and voice change.   Eyes: Negative for pain, discharge and visual disturbance.  Respiratory: Negative for cough, chest tightness, wheezing and stridor.   Cardiovascular: Negative for chest pain, palpitations and leg swelling.    Gastrointestinal: Negative for abdominal distention, abdominal pain, blood in stool, constipation, diarrhea, nausea and vomiting.  Genitourinary: Negative for difficulty urinating, discharge, flank pain, genital sores, hematuria and urgency.  Musculoskeletal: Negative for arthralgias, back pain, gait problem, joint swelling, myalgias and neck stiffness.  Skin: Negative for rash.  Neurological: Negative for dizziness, syncope, speech difficulty, weakness, numbness and headaches.  Hematological: Negative  for adenopathy. Does not bruise/bleed easily.  Psychiatric/Behavioral: Negative for behavioral problems and dysphoric mood. The patient is not nervous/anxious.        Objective:   Physical Exam  Constitutional: He appears well-developed and well-nourished.  Repeat blood pressure 138/68  HENT:  Head: Normocephalic and atraumatic.  Right Ear: External ear normal.  Left Ear: External ear normal.  Nose: Nose normal.  Mouth/Throat: Oropharynx is clear and moist.  Eyes: Pupils are equal, round, and reactive to light. Conjunctivae and EOM are normal. No scleral icterus.  Neck: Normal range of motion. Neck supple. No JVD present. No thyromegaly present.  Cardiovascular: Regular rhythm, normal heart sounds and intact distal pulses. Exam reveals no gallop and no friction rub.  No murmur heard. Pulmonary/Chest: Effort normal and breath sounds normal. He exhibits no tenderness.  Abdominal: Soft. Bowel sounds are normal. He exhibits no distension and no mass. There is no tenderness.  Genitourinary: Prostate normal and penis normal.  Genitourinary Comments: Prostate +2 enlarged and symmetrical.  Stool heme-negative  Musculoskeletal: Normal range of motion. He exhibits no edema or tenderness.  Status post right total knee replacement surgery  Lymphadenopathy:    He has no cervical adenopathy.  Neurological: He is alert. He has normal reflexes. No cranial nerve deficit. Coordination normal.  Skin:  Skin is warm and dry. No rash noted.  Psychiatric: He has a normal mood and affect. His behavior is normal.          Assessment & Plan:   Subsequent Medicare wellness visit Essential hypertension stable Dyslipidemia continue statin therapy History of colonic polyps/history of colon cancer in a first-degree relative.  Continue 5-year interval colonoscopies  Medications updated Continue home blood pressure monitoring Annual exam in 1 year with a new provider  Nyoka Cowden

## 2017-07-25 DIAGNOSIS — H2511 Age-related nuclear cataract, right eye: Secondary | ICD-10-CM | POA: Diagnosis not present

## 2017-07-25 DIAGNOSIS — H2513 Age-related nuclear cataract, bilateral: Secondary | ICD-10-CM | POA: Diagnosis not present

## 2017-08-16 DIAGNOSIS — H25811 Combined forms of age-related cataract, right eye: Secondary | ICD-10-CM | POA: Diagnosis not present

## 2017-08-16 DIAGNOSIS — H2511 Age-related nuclear cataract, right eye: Secondary | ICD-10-CM | POA: Diagnosis not present

## 2017-08-23 DIAGNOSIS — H2512 Age-related nuclear cataract, left eye: Secondary | ICD-10-CM | POA: Diagnosis not present

## 2017-08-23 DIAGNOSIS — H25812 Combined forms of age-related cataract, left eye: Secondary | ICD-10-CM | POA: Diagnosis not present

## 2017-08-31 ENCOUNTER — Encounter: Payer: Self-pay | Admitting: Internal Medicine

## 2017-08-31 ENCOUNTER — Ambulatory Visit (INDEPENDENT_AMBULATORY_CARE_PROVIDER_SITE_OTHER): Payer: Medicare Other | Admitting: Internal Medicine

## 2017-08-31 VITALS — BP 142/82 | HR 90 | Temp 99.3°F | Wt 164.0 lb

## 2017-08-31 DIAGNOSIS — B9789 Other viral agents as the cause of diseases classified elsewhere: Secondary | ICD-10-CM

## 2017-08-31 DIAGNOSIS — J069 Acute upper respiratory infection, unspecified: Secondary | ICD-10-CM | POA: Diagnosis not present

## 2017-08-31 DIAGNOSIS — I10 Essential (primary) hypertension: Secondary | ICD-10-CM

## 2017-08-31 MED ORDER — LISINOPRIL 20 MG PO TABS: 20.0000 mg | ORAL_TABLET | Freq: Every day | ORAL | 4 refills | Status: DC

## 2017-08-31 MED ORDER — ATORVASTATIN CALCIUM 20 MG PO TABS: 20.0000 mg | ORAL_TABLET | Freq: Every day | ORAL | 6 refills | Status: DC

## 2017-08-31 NOTE — Progress Notes (Signed)
Subjective:    Patient ID: Timothy [name], male    DOB: 12/04/1949, 67 y.o.   MRN: [id number]  HPI 67 year old patient who presents with a 3-day history of sinus and chest congestion low-grade fever productive cough and malaise.  Symptoms peaked yesterday and he states that he feels improved today. His wife has elective orthopedic surgery in 7 days and his chief concern was transmission of illness to his wife.  Past Medical History:  Diagnosis Date  . COLONIC POLYPS, HX OF 02/12/2009  . HYPERLIPIDEMIA 11/21/2007  . 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 OSTEOARTHRITIS 01/17/2007     Social History   Socioeconomic History  . Marital status: Married    Spouse name: Not on file  . Number of children: Not on file  . Years of education: Not on file  . Highest education level: Not on file  Occupational History  . Not on file  Social Needs  . Financial resource strain: Not on file  . Food insecurity:    Worry: Not on file    Inability: Not on file  . Transportation needs:    Medical: Not on file    Non-medical: Not on file  Tobacco Use  . Smoking status: Never Smoker  . Smokeless tobacco: Current User    Types: Chew  Substance and Sexual Activity  . Alcohol use: Yes    Alcohol/week: 3.6 - 4.8 oz    Types: 6 - 8 Cans of beer per week  . Drug use: No  . Sexual activity: Not on file  Lifestyle  . Physical activity:    Days per week: Not on file    Minutes per session: Not on file  . Stress: Not on file  Relationships  . Social connections:    Talks on phone: Not on file    Gets together: Not on file    Attends religious service: Not on file    Active member of club or organization: Not on file    Attends meetings of clubs or organizations: Not on file    Relationship status: Not on file  . Intimate partner violence:    Fear of current or ex partner: Not on file    Emotionally abused: Not on file    Physically abused: Not on file    Forced sexual activity: Not  on file  Other Topics Concern  . Not on file  Social History Narrative  . Not on file    Past Surgical History:  Procedure Laterality Date  . APPENDECTOMY    . CARPAL TUNNEL RELEASE    . HERNIA REPAIR     ingunial  . KNEE ARTHROSCOPY     bilateral  . SHOULDER SURGERY     left  . TOTAL KNEE ARTHROPLASTY     right    Family History  Problem Relation Age of Onset  . Cancer Father        colon ca  . Colon cancer Father   . Esophageal cancer Neg Hx   . Rectal cancer Neg Hx   . Stomach cancer Neg Hx     No Known Allergies  Current Outpatient Medications on File Prior to Visit  Medication Sig Dispense Refill  . aspirin 81 MG tablet Take 162 mg by mouth daily.    Marland Kitchen CIALIS 20 MG tablet TAKE 1 TABLET (20 MG TOTAL) BY MOUTH DAILY AS NEEDED FOR ERECTILE DYSFUNCTION. 6 tablet 5  . clobetasol (TEMOVATE) 0.05 % external solution APPLY TO SCALP  RASH AS NEEDED  5  . fluticasone (FLONASE) 50 MCG/ACT nasal spray Place 2 sprays into both nostrils daily. 16 g 6  . Multiple Vitamin (MULTIVITAMIN) tablet Take 1 tablet by mouth daily.    . tadalafil (CIALIS) 20 MG tablet Take 1 tablet (20 mg total) by mouth daily as needed for erectile dysfunction. 6 tablet 12   No current facility-administered medications on file prior to visit.     BP (!) 142/82 (BP Location: Right Arm, Patient Position: Sitting, Cuff Size: Large)   Pulse 90   Temp 99.3 F (37.4 C) (Oral)   Wt 164 lb (74.4 kg)   SpO2 95%   BMI 24.22 kg/m      Review of Systems  Constitutional: Positive for activity change, appetite change and fatigue. Negative for chills and fever.  HENT: Positive for congestion and sinus pressure. Negative for dental problem, ear pain, hearing loss, sore throat, tinnitus, trouble swallowing and voice change.   Eyes: Negative for pain, discharge and visual disturbance.  Respiratory: Positive for cough. Negative for chest tightness, wheezing and stridor.   Cardiovascular: Negative for chest  pain, palpitations and leg swelling.  Gastrointestinal: Negative for abdominal distention, abdominal pain, blood in stool, constipation, diarrhea, nausea and vomiting.  Genitourinary: Negative for difficulty urinating, discharge, flank pain, genital sores, hematuria and urgency.  Musculoskeletal: Negative for arthralgias, back pain, gait problem, joint swelling, myalgias and neck stiffness.  Skin: Negative for rash.  Neurological: Negative for dizziness, syncope, speech difficulty, weakness, numbness and headaches.  Hematological: Negative for adenopathy. Does not bruise/bleed easily.  Psychiatric/Behavioral: Negative for behavioral problems and dysphoric mood. The patient is not nervous/anxious.        Objective:   Physical Exam  Constitutional: He is oriented to person, place, and time. He appears well-developed.  Temperature 99.3 No distress O2 saturation 95%  HENT:  Head: Normocephalic.  Right Ear: External ear normal.  Left Ear: External ear normal.  Eyes: Conjunctivae and EOM are normal.  Neck: Normal range of motion.  Cardiovascular: Normal rate and normal heart sounds.  Pulmonary/Chest: Effort normal and breath sounds normal.  Abdominal: Bowel sounds are normal.  Musculoskeletal: Normal range of motion. He exhibits no edema or tenderness.  Neurological: He is alert and oriented to person, place, and time.  Psychiatric: He has a normal mood and affect. His behavior is normal.          Assessment & Plan:   Viral URI with cough. Rest hydration encouraged We will treat symptomatically  Measures to minimize transmission discussed  Marletta Lor

## 2017-08-31 NOTE — Patient Instructions (Addendum)

## 2017-12-05 DIAGNOSIS — H26491 Other secondary cataract, right eye: Secondary | ICD-10-CM | POA: Diagnosis not present

## 2017-12-09 DIAGNOSIS — M25561 Pain in right knee: Secondary | ICD-10-CM | POA: Diagnosis not present

## 2017-12-09 DIAGNOSIS — Z96659 Presence of unspecified artificial knee joint: Secondary | ICD-10-CM | POA: Insufficient documentation

## 2017-12-09 HISTORY — DX: Presence of unspecified artificial knee joint: Z96.659

## 2017-12-14 DIAGNOSIS — H26492 Other secondary cataract, left eye: Secondary | ICD-10-CM | POA: Diagnosis not present

## 2017-12-22 ENCOUNTER — Encounter: Payer: Self-pay | Admitting: Family Medicine

## 2017-12-22 ENCOUNTER — Ambulatory Visit (INDEPENDENT_AMBULATORY_CARE_PROVIDER_SITE_OTHER): Payer: Medicare Other | Admitting: Family Medicine

## 2017-12-22 VITALS — BP 138/90 | HR 99 | Temp 98.4°F | Ht 69.0 in | Wt 161.0 lb

## 2017-12-22 DIAGNOSIS — E785 Hyperlipidemia, unspecified: Secondary | ICD-10-CM

## 2017-12-22 DIAGNOSIS — I10 Essential (primary) hypertension: Secondary | ICD-10-CM | POA: Diagnosis not present

## 2017-12-22 DIAGNOSIS — Z23 Encounter for immunization: Secondary | ICD-10-CM

## 2017-12-22 HISTORY — DX: Encounter for immunization: Z23

## 2017-12-22 NOTE — Patient Instructions (Signed)
DASH Eating Plan DASH stands for "Dietary Approaches to Stop Hypertension." The DASH eating plan is a healthy eating plan that has been shown to reduce high blood pressure (hypertension). It may also reduce your risk for type 2 diabetes, heart disease, and stroke. The DASH eating plan may also help with weight loss. What are tips for following this plan? General guidelines  Avoid eating more than 2,300 mg (milligrams) of salt (sodium) a day. If you have hypertension, you may need to reduce your sodium intake to 1,500 mg a day.  Limit alcohol intake to no more than 1 drink a day for nonpregnant women and 2 drinks a day for men. One drink equals 12 oz of beer, 5 oz of wine, or 1 oz of hard liquor.  Work with your health care provider to maintain a healthy body weight or to lose weight. Ask what an ideal weight is for you.  Get at least 30 minutes of exercise that causes your heart to beat faster (aerobic exercise) most days of the week. Activities may include walking, swimming, or biking.  Work with your health care provider or diet and nutrition specialist (dietitian) to adjust your eating plan to your individual calorie needs. Reading food labels  Check food labels for the amount of sodium per serving. Choose foods with less than 5 percent of the Daily Value of sodium. Generally, foods with less than 300 mg of sodium per serving fit into this eating plan.  To find whole grains, look for the word "whole" as the first word in the ingredient list. Shopping  Buy products labeled as "low-sodium" or "no salt added."  Buy fresh foods. Avoid canned foods and premade or frozen meals. Cooking  Avoid adding salt when cooking. Use salt-free seasonings or herbs instead of table salt or sea salt. Check with your health care provider or pharmacist before using salt substitutes.  Do not fry foods. Cook foods using healthy methods such as baking, boiling, grilling, and broiling instead.  Cook with  heart-healthy oils, such as olive, canola, soybean, or sunflower oil. Meal planning   Eat a balanced diet that includes: ? 5 or more servings of fruits and vegetables each day. At each meal, try to fill half of your plate with fruits and vegetables. ? Up to 6-8 servings of whole grains each day. ? Less than 6 oz of lean meat, poultry, or fish each day. A 3-oz serving of meat is about the same size as a deck of cards. One egg equals 1 oz. ? 2 servings of low-fat dairy each day. ? A serving of nuts, seeds, or beans 5 times each week. ? Heart-healthy fats. Healthy fats called Omega-3 fatty acids are found in foods such as flaxseeds and coldwater fish, like sardines, salmon, and mackerel.  Limit how much you eat of the following: ? Canned or prepackaged foods. ? Food that is high in trans fat, such as fried foods. ? Food that is high in saturated fat, such as fatty meat. ? Sweets, desserts, sugary drinks, and other foods with added sugar. ? Full-fat dairy products.  Do not salt foods before eating.  Try to eat at least 2 vegetarian meals each week.  Eat more home-cooked food and less restaurant, buffet, and fast food.  When eating at a restaurant, ask that your food be prepared with less salt or no salt, if possible. What foods are recommended? The items listed may not be a complete list. Talk with your dietitian about what   dietary choices are best for you. Grains Whole-grain or whole-wheat bread. Whole-grain or whole-wheat pasta. Brown rice. Oatmeal. Quinoa. Bulgur. Whole-grain and low-sodium cereals. Pita bread. Low-fat, low-sodium crackers. Whole-wheat flour tortillas. Vegetables Fresh or frozen vegetables (raw, steamed, roasted, or grilled). Low-sodium or reduced-sodium tomato and vegetable juice. Low-sodium or reduced-sodium tomato sauce and tomato paste. Low-sodium or reduced-sodium canned vegetables. Fruits All fresh, dried, or frozen fruit. Canned fruit in natural juice (without  added sugar). Meat and other protein foods Skinless chicken or turkey. Ground chicken or turkey. Pork with fat trimmed off. Fish and seafood. Egg whites. Dried beans, peas, or lentils. Unsalted nuts, nut butters, and seeds. Unsalted canned beans. Lean cuts of beef with fat trimmed off. Low-sodium, lean deli meat. Dairy Low-fat (1%) or fat-free (skim) milk. Fat-free, low-fat, or reduced-fat cheeses. Nonfat, low-sodium ricotta or cottage cheese. Low-fat or nonfat yogurt. Low-fat, low-sodium cheese. Fats and oils Soft margarine without trans fats. Vegetable oil. Low-fat, reduced-fat, or light mayonnaise and salad dressings (reduced-sodium). Canola, safflower, olive, soybean, and sunflower oils. Avocado. Seasoning and other foods Herbs. Spices. Seasoning mixes without salt. Unsalted popcorn and pretzels. Fat-free sweets. What foods are not recommended? The items listed may not be a complete list. Talk with your dietitian about what dietary choices are best for you. Grains Baked goods made with fat, such as croissants, muffins, or some breads. Dry pasta or rice meal packs. Vegetables Creamed or fried vegetables. Vegetables in a cheese sauce. Regular canned vegetables (not low-sodium or reduced-sodium). Regular canned tomato sauce and paste (not low-sodium or reduced-sodium). Regular tomato and vegetable juice (not low-sodium or reduced-sodium). Pickles. Olives. Fruits Canned fruit in a light or heavy syrup. Fried fruit. Fruit in cream or butter sauce. Meat and other protein foods Fatty cuts of meat. Ribs. Fried meat. Bacon. Sausage. Bologna and other processed lunch meats. Salami. Fatback. Hotdogs. Bratwurst. Salted nuts and seeds. Canned beans with added salt. Canned or smoked fish. Whole eggs or egg yolks. Chicken or turkey with skin. Dairy Whole or 2% milk, cream, and half-and-half. Whole or full-fat cream cheese. Whole-fat or sweetened yogurt. Full-fat cheese. Nondairy creamers. Whipped toppings.  Processed cheese and cheese spreads. Fats and oils Butter. Stick margarine. Lard. Shortening. Ghee. Bacon fat. Tropical oils, such as coconut, palm kernel, or palm oil. Seasoning and other foods Salted popcorn and pretzels. Onion salt, garlic salt, seasoned salt, table salt, and sea salt. Worcestershire sauce. Tartar sauce. Barbecue sauce. Teriyaki sauce. Soy sauce, including reduced-sodium. Steak sauce. Canned and packaged gravies. Fish sauce. Oyster sauce. Cocktail sauce. Horseradish that you find on the shelf. Ketchup. Mustard. Meat flavorings and tenderizers. Bouillon cubes. Hot sauce and Tabasco sauce. Premade or packaged marinades. Premade or packaged taco seasonings. Relishes. Regular salad dressings. Where to find more information:  National Heart, Lung, and Blood Institute: www.nhlbi.nih.gov  American Heart Association: www.heart.org Summary  The DASH eating plan is a healthy eating plan that has been shown to reduce high blood pressure (hypertension). It may also reduce your risk for type 2 diabetes, heart disease, and stroke.  With the DASH eating plan, you should limit salt (sodium) intake to 2,300 mg a day. If you have hypertension, you may need to reduce your sodium intake to 1,500 mg a day.  When on the DASH eating plan, aim to eat more fresh fruits and vegetables, whole grains, lean proteins, low-fat dairy, and heart-healthy fats.  Work with your health care provider or diet and nutrition specialist (dietitian) to adjust your eating plan to your individual   calorie needs. This information is not intended to replace advice given to you by your health care provider. Make sure you discuss any questions you have with your health care provider. Document Released: 02/04/2011 Document Revised: 02/09/2016 Document Reviewed: 02/09/2016 Elsevier Interactive Patient Education  2018 [place] Maintenance, Male A healthy lifestyle and preventive care is important for your  health and wellness. Ask your health care provider about what schedule of regular examinations is right for you. What should I know about weight and diet? Eat a Healthy Diet  Eat plenty of vegetables, fruits, whole grains, low-fat dairy products, and lean protein.  Do not eat a lot of foods high in solid fats, added sugars, or salt.  Maintain a Healthy Weight Regular exercise can help you achieve or maintain a healthy weight. You should:  Do at least 150 minutes of exercise each week. The exercise should increase your heart rate and make you sweat (moderate-intensity exercise).  Do strength-training exercises at least twice a week.  Watch Your Levels of Cholesterol and Blood Lipids  Have your blood tested for lipids and cholesterol every 5 years starting at 67 years of age. If you are at high risk for heart disease, you should start having your blood tested when you are 67 years old. You may need to have your cholesterol levels checked more often if: ? Your lipid or cholesterol levels are high. ? You are older than 67 years of age. ? You are at high risk for heart disease.  What should I know about cancer screening? Many types of cancers can be detected early and may often be prevented. Lung Cancer  You should be screened every year for lung cancer if: ? You are a current smoker who has smoked for at least 30 years. ? You are a former smoker who has quit within the past 15 years.  Talk to your health care provider about your screening options, when you should start screening, and how often you should be screened.  Colorectal Cancer  Routine colorectal cancer screening usually begins at 67 years of age and should be repeated every 5-10 years until you are 67 years old. You may need to be screened more often if early forms of precancerous polyps or small growths are found. Your health care provider may recommend screening at an earlier age if you have risk factors for colon  cancer.  Your health care provider may recommend using home test kits to check for hidden blood in the stool.  A small camera at the end of a tube can be used to examine your colon (sigmoidoscopy or colonoscopy). This checks for the earliest forms of colorectal cancer.  Prostate and Testicular Cancer  Depending on your age and overall health, your health care provider may do certain tests to screen for prostate and testicular cancer.  Talk to your health care provider about any symptoms or concerns you have about testicular or prostate cancer.  Skin Cancer  Check your skin from head to toe regularly.  Tell your health care provider about any new moles or changes in moles, especially if: ? There is a change in a mole's size, shape, or color. ? You have a mole that is larger than a pencil eraser.  Always use sunscreen. Apply sunscreen liberally and repeat throughout the day.  Protect yourself by wearing long sleeves, pants, a wide-brimmed hat, and sunglasses when outside.  What should I know about heart disease, diabetes, and high blood pressure?  If you are 24-38 years of age, have your blood pressure checked every 3-5 years. If you are 9 years of age or older, have your blood pressure checked every year. You should have your blood pressure measured twice-once when you are at a hospital or clinic, and once when you are not at a hospital or clinic. Record the average of the two measurements. To check your blood pressure when you are not at a hospital or clinic, you can use: ? An automated blood pressure machine at a pharmacy. ? A home blood pressure monitor.  Talk to your health care provider about your target blood pressure.  If you are between 3-36 years old, ask your health care provider if you should take aspirin to prevent heart disease.  Have regular diabetes screenings by checking your fasting blood sugar level. ? If you are at a normal weight and have a low risk for  diabetes, have this test once every three years after the age of 25. ? If you are overweight and have a high risk for diabetes, consider being tested at a younger age or more often.  A one-time screening for abdominal aortic aneurysm (AAA) by ultrasound is recommended for men aged 66-75 years who are current or former smokers. What should I know about preventing infection? Hepatitis B If you have a higher risk for hepatitis B, you should be screened for this virus. Talk with your health care provider to find out if you are at risk for hepatitis B infection. Hepatitis C Blood testing is recommended for:  Everyone born from [birth year] through 1965.  Anyone with known risk factors for hepatitis C.  Sexually Transmitted Diseases (STDs)  You should be screened each year for STDs including gonorrhea and chlamydia if: ? You are sexually active and are younger than 67 years of age. ? You are older than 66 years of age and your health care provider tells you that you are at risk for this type of infection. ? Your sexual activity has changed since you were last screened and you are at an increased risk for chlamydia or gonorrhea. Ask your health care provider if you are at risk.  Talk with your health care provider about whether you are at high risk of being infected with HIV. Your health care provider may recommend a prescription medicine to help prevent HIV infection.  What else can I do?  Schedule regular health, dental, and eye exams.  Stay current with your vaccines (immunizations).  Do not use any tobacco products, such as cigarettes, chewing tobacco, and e-cigarettes. If you need help quitting, ask your health care provider.  Limit alcohol intake to no more than 2 drinks per day. One drink equals 12 ounces of beer, 5 ounces of wine, or 1 ounces of hard liquor.  Do not use street drugs.  Do not share needles.  Ask your health care provider for help if you need support or information about  quitting drugs.  Tell your health care provider if you often feel depressed.  Tell your health care provider if you have ever been abused or do not feel safe at home. This information is not intended to replace advice given to you by your health care provider. Make sure you discuss any questions you have with your health care provider. Document Released: 08/14/2007 Document Revised: 10/15/2015 Document Reviewed: 11/19/2014 Elsevier Interactive Patient Education  [employer].

## 2017-12-22 NOTE — Progress Notes (Signed)
Subjective:  Patient ID: Timothy [name], male    DOB: 12/04/1949  Age: 67 y.o. MRN: [id number]  CC: Establish Care   HPI Timothy [name] presents for establishment of care by transfer.  He enjoys excellent health and remains physically active on most days of the week.  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  He comes and goes as he pleases at this point.  He does not smoke or use illicit drugs.  He has no more than 1 or 2 alcoholic drinks daily.  His wife just had a hip replacement and he has been busy helping her.  He is due for colonoscopy and is in the process of scheduling that himself.  He did have a flu shot today.  He had been using clobetasol in the distant past for scalp dermatitis.  Outpatient Medications Prior to Visit  Medication Sig Dispense Refill  . aspirin 81 MG tablet Take 162 mg by mouth daily.    Marland Kitchen atorvastatin (LIPITOR) 20 MG tablet Take 1 tablet (20 mg total) by mouth daily. 90 tablet 6  . CIALIS 20 MG tablet TAKE 1 TABLET (20 MG TOTAL) BY MOUTH DAILY AS NEEDED FOR ERECTILE DYSFUNCTION. 6 tablet 5  . fluticasone (FLONASE) 50 MCG/ACT nasal spray Place 2 sprays into both nostrils daily. 16 g 6  . lisinopril (PRINIVIL,ZESTRIL) 20 MG tablet Take 1 tablet (20 mg total) by mouth daily. 90 tablet 4  . Multiple Vitamin (MULTIVITAMIN) tablet Take 1 tablet by mouth daily.    . tadalafil (CIALIS) 20 MG tablet Take 1 tablet (20 mg total) by mouth daily as needed for erectile dysfunction. 6 tablet 12  . clobetasol (TEMOVATE) 0.05 % external solution APPLY TO SCALP RASH AS NEEDED  5   No facility-administered medications prior to visit.     ROS Review of Systems  Constitutional: Negative.   HENT: Negative.   Eyes: Negative.   Respiratory: Negative.   Cardiovascular: Negative.   Gastrointestinal: Negative.   Endocrine: Negative for polyphagia and polyuria.  Genitourinary: Negative.   Musculoskeletal: Negative.   Skin:  Negative.   Neurological: Negative.   Hematological: Negative.   Psychiatric/Behavioral: Negative.     Objective:  BP 138/90   Pulse 99   Temp 98.4 F (36.9 C) (Oral)   Ht 5\' 9"  (1.753 m)   Wt 161 lb (73 kg)   SpO2 97%   BMI 23.78 kg/m   BP Readings from Last 3 Encounters:  12/22/17 138/90  08/31/17 (!) 142/82  07/21/17 (!) 148/72    Wt Readings from Last 3 Encounters:  12/22/17 161 lb (73 kg)  08/31/17 164 lb (74.4 kg)  07/21/17 165 lb (74.8 kg)    Physical Exam  Constitutional: He is oriented to person, place, and time. He appears well-developed and well-nourished. No distress.  HENT:  Head: Normocephalic.  Right Ear: External ear normal.  Left Ear: External ear normal.  Mouth/Throat: Oropharynx is clear and moist. No oropharyngeal exudate.  Eyes: Pupils are equal, round, and reactive to light. Conjunctivae and EOM are normal. Right eye exhibits no discharge. Left eye exhibits no discharge. No scleral icterus.  Neck: Neck supple. No JVD present. No tracheal deviation present. No thyromegaly present.  Cardiovascular: Normal rate, regular rhythm and normal heart sounds.  Pulmonary/Chest: Effort normal and breath sounds normal.  Abdominal: Soft. Bowel sounds are normal. He exhibits no distension and no mass. There is no tenderness. There is  no guarding.  Lymphadenopathy:    He has no cervical adenopathy.  Neurological: He is alert and oriented to person, place, and time.  Skin: Skin is warm and dry. He is not diaphoretic.  Psychiatric: He has a normal mood and affect. His behavior is normal.    Lab Results  Component Value Date   WBC 7.4 07/14/2017   HGB 15.8 07/14/2017   HCT 45.8 07/14/2017   PLT 198.0 07/14/2017   GLUCOSE 97 07/14/2017   CHOL 151 07/14/2017   TRIG 90.0 07/14/2017   HDL 70.10 07/14/2017   LDLDIRECT 76.0 07/09/2016   LDLCALC 63 07/14/2017   ALT 39 07/14/2017   AST 53 (H) 07/14/2017   NA 134 (L) 07/14/2017   K 4.5 07/14/2017   CL 97  07/14/2017   CREATININE 0.86 07/14/2017   BUN 8 07/14/2017   CO2 29 07/14/2017   TSH 0.81 07/14/2017   PSA 1.07 05/13/2015   INR 1.5 01/25/2008    No results found.  Assessment & Plan:   Arik was seen today for establish care.  Diagnoses and all orders for this visit:  Essential hypertension  Need for influenza vaccination -     Flu vaccine HIGH DOSE PF  Dyslipidemia   I have discontinued Odysseus C. Koopmann "Charles"'s clobetasol. I am also having him maintain his multivitamin, aspirin, tadalafil, CIALIS, fluticasone, atorvastatin, and lisinopril.  No orders of the defined types were placed in this encounter.   Strongly encouraged patient to maintain his active lifestyle and positive outlook on life.  He will follow-up with me in June for complete physical. Follow-up: Return Follow up in June for a physical.  Libby Maw, MD

## 2018-01-04 ENCOUNTER — Encounter: Payer: Self-pay | Admitting: Internal Medicine

## 2018-01-31 ENCOUNTER — Other Ambulatory Visit: Payer: Self-pay

## 2018-01-31 ENCOUNTER — Ambulatory Visit (AMBULATORY_SURGERY_CENTER): Payer: Self-pay

## 2018-01-31 VITALS — Ht 70.0 in | Wt 166.0 lb

## 2018-01-31 DIAGNOSIS — Z860100 Personal history of colon polyps, unspecified: Secondary | ICD-10-CM

## 2018-01-31 DIAGNOSIS — Z800 Family history of malignant neoplasm of digestive organs: Secondary | ICD-10-CM

## 2018-01-31 MED ORDER — NA SULFATE-K SULFATE-MG SULF 17.5-3.13-1.6 GM/177ML PO SOLN: 1.0000 | Freq: Once | ORAL | 0 refills | Status: AC

## 2018-01-31 NOTE — Progress Notes (Signed)
No egg or soy allergy known to patient  No issues with past sedation with any surgeries  or procedures, no intubation problems  No diet pills per patient No home 02 use per patient  No blood thinners per patient  Pt denies issues with constipation  No A fib or A flutter  EMMI video sent to pt's e mail , pt declined    

## 2018-02-14 ENCOUNTER — Ambulatory Visit (AMBULATORY_SURGERY_CENTER): Payer: Medicare Other | Admitting: Internal Medicine

## 2018-02-14 ENCOUNTER — Encounter: Payer: Self-pay | Admitting: Internal Medicine

## 2018-02-14 VITALS — BP 156/78 | HR 93 | Temp 99.6°F | Resp 12 | Ht 70.0 in | Wt 166.0 lb

## 2018-02-14 DIAGNOSIS — Z800 Family history of malignant neoplasm of digestive organs: Secondary | ICD-10-CM

## 2018-02-14 DIAGNOSIS — Z86010 Personal history of colonic polyps: Secondary | ICD-10-CM

## 2018-02-14 DIAGNOSIS — D124 Benign neoplasm of descending colon: Secondary | ICD-10-CM | POA: Diagnosis not present

## 2018-02-14 DIAGNOSIS — Z1211 Encounter for screening for malignant neoplasm of colon: Secondary | ICD-10-CM | POA: Diagnosis not present

## 2018-02-14 MED ORDER — SODIUM CHLORIDE 0.9 % IV SOLN: 500.0000 mL | Freq: Once | INTRAVENOUS | Status: DC

## 2018-02-14 NOTE — Progress Notes (Signed)
I have reviewed the patient's medical history in detail and updated the computerized patient record.

## 2018-02-14 NOTE — Progress Notes (Signed)
A/ox3, pleased with MAC, report to RN 

## 2018-02-14 NOTE — Patient Instructions (Signed)
Thank you for allowing [place] to care for you today!  Await pathology results, approximately 2 weeks.  Recommend surveillance colonoscopy in 5 years.  Resume previous diet and medications today.  Return to normal activities tomorrow.     YOU HAD AN ENDOSCOPIC PROCEDURE TODAY AT [facility]:   Refer to the procedure report that was given to you for any specific questions about what was found during the examination.  If the procedure report does not answer your questions, please call your gastroenterologist to clarify.  If you requested that your care partner not be given the details of your procedure findings, then the procedure report has been included in a sealed envelope for you to review at your convenience later.  YOU SHOULD EXPECT: Some feelings of bloating in the abdomen. Passage of more gas than usual.  Walking can help get rid of the air that was put into your GI tract during the procedure and reduce the bloating. If you had a lower endoscopy (such as a colonoscopy or flexible sigmoidoscopy) you may notice spotting of blood in your stool or on the toilet paper. If you underwent a bowel prep for your procedure, you may not have a normal bowel movement for a few days.  Please Note:  You might notice some irritation and congestion in your nose or some drainage.  This is from the oxygen used during your procedure.  There is no need for concern and it should clear up in a day or so.  SYMPTOMS TO REPORT IMMEDIATELY:   Following lower endoscopy (colonoscopy or flexible sigmoidoscopy):  Excessive amounts of blood in the stool  Significant tenderness or worsening of abdominal pains  Swelling of the abdomen that is new, acute  Fever of 100F or higher   For urgent or emergent issues, a gastroenterologist can be reached at any hour by calling [phone].   DIET:  We do recommend a small meal at first, but then you may proceed to your regular diet.  Drink plenty of fluids  but you should avoid alcoholic beverages for 24 hours.  ACTIVITY:  You should plan to take it easy for the rest of today and you should NOT DRIVE or use heavy machinery until tomorrow (because of the sedation medicines used during the test).    FOLLOW UP: Our staff will call the number listed on your records the next business day following your procedure to check on you and address any questions or concerns that you may have regarding the information given to you following your procedure. If we do not reach you, we will leave a message.  However, if you are feeling well and you are not experiencing any problems, there is no need to return our call.  We will assume that you have returned to your regular daily activities without incident.  If any biopsies were taken you will be contacted by phone or by letter within the next 1-3 weeks.  Please call us at [phone] if you have not heard about the biopsies in 3 weeks.    SIGNATURES/CONFIDENTIALITY: You and/or your care partner have signed paperwork which will be entered into your electronic medical record.  These signatures attest to the fact that that the information above on your After Visit Summary has been reviewed and is understood.  Full responsibility of the confidentiality of this discharge information lies with you and/or your care-partner.

## 2018-02-14 NOTE — Op Note (Signed)
[facility] Patient Name: Timothy [name] Procedure Date: 02/14/2018 12:07 PM MRN: [id number] Endoscopist: Docia Chuck. Henrene Pastor , MD Age: 67 Referring MD:  Date of Birth: 12/04/1949 Gender: Male Account #: [id number] Procedure:                Colonoscopy with cold snare polypectomy x 1 Indications:              High risk colon cancer surveillance: Personal                            history of sessile serrated colon polyp (less than                            10 mm in size) with no dysplasia. Also parent with                            colon cancer around age 40. Previous examinations                            2004, 2009, 2014 Medicines:                Monitored Anesthesia Care Procedure:                Pre-Anesthesia Assessment:                           - Prior to the procedure, a History and Physical                            was performed, and patient medications and                            allergies were reviewed. The patient's tolerance of                            previous anesthesia was also reviewed. The risks                            and benefits of the procedure and the sedation                            options and risks were discussed with the patient.                            All questions were answered, and informed consent                            was obtained. Prior Anticoagulants: The patient has                            taken no previous anticoagulant or antiplatelet                            agents. ASA Grade Assessment: II - A patient with  mild systemic disease. After reviewing the risks                            and benefits, the patient was deemed in                            satisfactory condition to undergo the procedure.                           After obtaining informed consent, the colonoscope                            was passed under direct vision. Throughout the                            procedure, the  patient's blood pressure, pulse, and                            oxygen saturations were monitored continuously. The                            Colonoscope was introduced through the anus and                            advanced to the the cecum, identified by                            appendiceal orifice and ileocecal valve. The                            ileocecal valve, appendiceal orifice, and rectum                            were photographed. The quality of the bowel                            preparation was excellent. The colonoscopy was                            performed without difficulty. The patient tolerated                            the procedure well. The bowel preparation used was                            SUPREP. Scope In: 1:16:32 PM Scope Out: 1:30:21 PM Scope Withdrawal Time: 0 hours 10 minutes 58 seconds  Total Procedure Duration: 0 hours 13 minutes 49 seconds  Findings:                 A 2 mm polyp was found in the descending colon. The                            polyp was removed with a cold snare. Resection and  retrieval were complete.                           The exam was otherwise without abnormality on                            direct and retroflexion views. Complications:            No immediate complications. Estimated blood loss:                            None. Estimated Blood Loss:     Estimated blood loss: none. Impression:               - One 2 mm polyp in the descending colon, removed                            with a cold snare. Resected and retrieved.                           - The examination was otherwise normal on direct                            and retroflexion views. Recommendation:           - Repeat colonoscopy in 5 years for surveillance.                           - Patient has a contact number available for                            emergencies. The signs and symptoms of potential                             delayed complications were discussed with the                            patient. Return to normal activities tomorrow.                            Written discharge instructions were provided to the                            patient.                           - Resume previous diet.                           - Continue present medications.                           - Await pathology results. Docia Chuck. Henrene Pastor, MD 02/14/2018 1:36:59 PM This report has been signed electronically.

## 2018-02-14 NOTE — Progress Notes (Signed)
Called to room to assist during endoscopic procedure.  Patient ID and intended procedure confirmed with present staff. Received instructions for my participation in the procedure from the performing physician.  

## 2018-02-15 ENCOUNTER — Telehealth: Payer: Self-pay

## 2018-02-15 NOTE — Telephone Encounter (Signed)
  Follow up Call-  Call back number 02/14/2018  Post procedure Call Back phone  # [phone]  Permission to leave phone message Yes  Some recent data might be hidden     Patient questions:  Do you have a fever, pain , or abdominal swelling? No. Pain Score  0 *  Have you tolerated food without any problems? Yes.    Have you been able to return to your normal activities? Yes.    Do you have any questions about your discharge instructions: Diet   No. Medications  No. Follow up visit  No.  Do you have questions or concerns about your Care? No.  Actions: * If pain score is 4 or above: No action needed, pain <4.  No problems noted per pt. maw

## 2018-02-20 ENCOUNTER — Encounter: Payer: Self-pay | Admitting: Internal Medicine

## 2018-04-03 DIAGNOSIS — L570 Actinic keratosis: Secondary | ICD-10-CM | POA: Diagnosis not present

## 2018-04-03 DIAGNOSIS — L821 Other seborrheic keratosis: Secondary | ICD-10-CM | POA: Diagnosis not present

## 2018-04-03 DIAGNOSIS — L812 Freckles: Secondary | ICD-10-CM | POA: Diagnosis not present

## 2018-06-15 ENCOUNTER — Encounter: Payer: Self-pay | Admitting: Family Medicine

## 2018-06-15 DIAGNOSIS — R972 Elevated prostate specific antigen [PSA]: Secondary | ICD-10-CM | POA: Diagnosis not present

## 2018-06-15 DIAGNOSIS — E291 Testicular hypofunction: Secondary | ICD-10-CM | POA: Diagnosis not present

## 2018-08-07 DIAGNOSIS — Z961 Presence of intraocular lens: Secondary | ICD-10-CM | POA: Diagnosis not present

## 2018-08-07 DIAGNOSIS — Z9889 Other specified postprocedural states: Secondary | ICD-10-CM | POA: Diagnosis not present

## 2018-08-07 DIAGNOSIS — I10 Essential (primary) hypertension: Secondary | ICD-10-CM | POA: Diagnosis not present

## 2018-08-07 DIAGNOSIS — H35372 Puckering of macula, left eye: Secondary | ICD-10-CM | POA: Diagnosis not present

## 2018-08-16 DIAGNOSIS — H35372 Puckering of macula, left eye: Secondary | ICD-10-CM | POA: Diagnosis not present

## 2018-08-16 DIAGNOSIS — Z961 Presence of intraocular lens: Secondary | ICD-10-CM | POA: Diagnosis not present

## 2018-08-16 DIAGNOSIS — H43811 Vitreous degeneration, right eye: Secondary | ICD-10-CM | POA: Diagnosis not present

## 2018-08-16 DIAGNOSIS — H43812 Vitreous degeneration, left eye: Secondary | ICD-10-CM | POA: Diagnosis not present

## 2018-08-21 ENCOUNTER — Telehealth: Payer: Self-pay

## 2018-08-21 NOTE — Telephone Encounter (Signed)

## 2018-08-22 ENCOUNTER — Encounter: Payer: Self-pay | Admitting: Family Medicine

## 2018-08-22 ENCOUNTER — Ambulatory Visit (INDEPENDENT_AMBULATORY_CARE_PROVIDER_SITE_OTHER): Payer: Medicare Other | Admitting: Family Medicine

## 2018-08-22 VITALS — BP 136/80 | HR 96 | Ht 70.0 in | Wt 164.4 lb

## 2018-08-22 DIAGNOSIS — E785 Hyperlipidemia, unspecified: Secondary | ICD-10-CM | POA: Diagnosis not present

## 2018-08-22 DIAGNOSIS — R945 Abnormal results of liver function studies: Secondary | ICD-10-CM | POA: Diagnosis not present

## 2018-08-22 DIAGNOSIS — Z0000 Encounter for general adult medical examination without abnormal findings: Secondary | ICD-10-CM | POA: Diagnosis not present

## 2018-08-22 DIAGNOSIS — Z23 Encounter for immunization: Secondary | ICD-10-CM | POA: Diagnosis not present

## 2018-08-22 DIAGNOSIS — I10 Essential (primary) hypertension: Secondary | ICD-10-CM

## 2018-08-22 DIAGNOSIS — R7989 Other specified abnormal findings of blood chemistry: Secondary | ICD-10-CM

## 2018-08-22 HISTORY — DX: Encounter for general adult medical examination without abnormal findings: Z00.00

## 2018-08-22 NOTE — Progress Notes (Addendum)
Established Patient Office Visit  Subjective:  Patient ID: Timothy [name], male    DOB: 12/04/1949  Age: 68 y.o. MRN: [id number]  CC:  Chief Complaint  Patient presents with  . Annual Exam    HPI Timothy [name] presents for a physical exam and follow-up of his hypertension and elevated lipids.  Blood pressures been controlled on the lisinopril and his cholesterol is well controlled with the atorvastatin.  Uses Cialis as needed.  Urine flow is good with occasional nocturia.  Remains active but is unable to go to the gym because of COVID restrictions.  Has multiple joint issues in his lower extremities from the time that he spent in the [employer] and was jumping out of helicopters.  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  Contemplating surgery to correct this.  Past Medical History:  Diagnosis Date  . Cataract   . Chronic kidney disease    kidney stones  . COLONIC POLYPS, HX OF 02/12/2009  . HYPERLIPIDEMIA 11/21/2007  . Hypertension   . 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 OSTEOARTHRITIS 01/17/2007    Past Surgical History:  Procedure Laterality Date  . APPENDECTOMY    . CARPAL TUNNEL RELEASE    . CATARACT EXTRACTION     june 2019  . HERNIA REPAIR     ingunial  . KNEE ARTHROSCOPY     bilateral  . SHOULDER SURGERY     left  . TOTAL KNEE ARTHROPLASTY     right    Family History  Problem Relation Age of Onset  . Cancer Father        colon ca  . Colon cancer Father   . Esophageal cancer Neg Hx   . Rectal cancer Neg Hx   . Stomach cancer Neg Hx     Social History   Socioeconomic History  . Marital status: Married    Spouse name: Not on file  . Number of children: Not on file  . Years of education: Not on file  . Highest education level: Not on file  Occupational History  . Not on file  Social Needs  . Financial resource strain: Not on file  . Food insecurity    Worry: Not on file    Inability: Not on file  .  Transportation needs    Medical: Not on file    Non-medical: Not on file  Tobacco Use  . Smoking status: Never Smoker  . Smokeless tobacco: Current User    Types: Chew  Substance and Sexual Activity  . Alcohol use: Yes    Alcohol/week: 6.0 - 8.0 standard drinks    Types: 6 - 8 Cans of beer per week  . Drug use: No  . Sexual activity: Not on file  Lifestyle  . Physical activity    Days per week: Not on file    Minutes per session: Not on file  . Stress: Not on file  Relationships  . Social Herbalist on phone: Not on file    Gets together: Not on file    Attends religious service: Not on file    Active member of club or organization: Not on file    Attends meetings of clubs or organizations: Not on file    Relationship status: Not on file  . Intimate partner violence    Fear of current or ex partner: Not on file    Emotionally abused: Not on file    Physically abused:  Not on file    Forced sexual activity: Not on file  Other Topics Concern  . Not on file  Social History Narrative  . Not on file    Outpatient Medications Prior to Visit  Medication Sig Dispense Refill  . aspirin 81 MG tablet Take 162 mg by mouth daily.    Marland Kitchen atorvastatin (LIPITOR) 20 MG tablet Take 1 tablet (20 mg total) by mouth daily. 90 tablet 6  . lisinopril (PRINIVIL,ZESTRIL) 20 MG tablet Take 1 tablet (20 mg total) by mouth daily. 90 tablet 4  . Multiple Vitamin (MULTIVITAMIN) tablet Take 1 tablet by mouth daily.    . tadalafil (CIALIS) 20 MG tablet Take 1 tablet (20 mg total) by mouth daily as needed for erectile dysfunction. 6 tablet 12  . CIALIS 20 MG tablet TAKE 1 TABLET (20 MG TOTAL) BY MOUTH DAILY AS NEEDED FOR ERECTILE DYSFUNCTION. 6 tablet 5   No facility-administered medications prior to visit.     No Known Allergies  ROS Review of Systems  Constitutional: Negative.   HENT: Negative.   Eyes: Positive for visual disturbance. Negative for photophobia.  Respiratory: Negative.    Cardiovascular: Negative.   Gastrointestinal: Negative.   Endocrine: Negative for polyphagia and polyuria.  Genitourinary: Negative for difficulty urinating, frequency and urgency.  Musculoskeletal: Positive for arthralgias.  Skin: Negative for pallor.  Allergic/Immunologic: Negative for immunocompromised state.  Neurological: Negative for light-headedness and headaches.  Hematological: Does not bruise/bleed easily.  Psychiatric/Behavioral: Negative.       Objective:    Physical Exam  Constitutional: He is oriented to person, place, and time. He appears well-developed and well-nourished. No distress.  HENT:  Head: Normocephalic and atraumatic.  Right Ear: External ear normal.  Left Ear: External ear normal.  Mouth/Throat: Oropharynx is clear and moist. No oropharyngeal exudate.  Eyes: Pupils are equal, round, and reactive to light. Conjunctivae are normal. Right eye exhibits no discharge. Left eye exhibits no discharge. No scleral icterus.  Neck: Neck supple. No JVD present. No tracheal deviation present. No thyromegaly present.  Cardiovascular: Normal rate, regular rhythm and normal heart sounds.  Pulmonary/Chest: Effort normal and breath sounds normal. No stridor.  Abdominal: Bowel sounds are normal. He exhibits no distension. There is no abdominal tenderness. There is no rebound and no guarding.  Genitourinary: Rectum:     Guaiac result negative.     No rectal mass, anal fissure, tenderness, external hemorrhoid, internal hemorrhoid or abnormal anal tone.  Prostate is enlarged. Prostate is not tender.  Lymphadenopathy:    He has no cervical adenopathy.  Neurological: He is alert and oriented to person, place, and time.  Skin: Skin is warm and dry. He is not diaphoretic.  Psychiatric: He has a normal mood and affect. His behavior is normal.    BP 136/80   Pulse 96   Ht 5\' 10"  (1.778 m)   Wt 164 lb 6 oz (74.6 kg)   SpO2 96%   BMI 23.59 kg/m  Wt Readings from Last 3  Encounters:  08/22/18 164 lb 6 oz (74.6 kg)  02/14/18 166 lb (75.3 kg)  01/31/18 166 lb (75.3 kg)   BP Readings from Last 3 Encounters:  08/22/18 136/80  02/14/18 (!) 156/78  12/22/17 138/90   Guideline developer:  UpToDate (see UpToDate for funding source) Date Released: June 2014  There are no preventive care reminders to display for this patient.  There are no preventive care reminders to display for this patient.  Lab Results  Component  Value Date   TSH 0.81 07/14/2017   Lab Results  Component Value Date   WBC 5.5 08/22/2018   HGB 15.6 08/22/2018   HCT 45.2 08/22/2018   MCV 93.4 08/22/2018   PLT 194 08/22/2018   Lab Results  Component Value Date   NA 130 (L) 08/22/2018   K 4.4 08/22/2018   CO2 26 08/22/2018   GLUCOSE 97 08/22/2018   BUN 8 08/22/2018   CREATININE 0.84 08/22/2018   BILITOT 0.9 08/22/2018   ALKPHOS 46 07/14/2017   AST 72 (H) 08/22/2018   ALT 52 (H) 08/22/2018   PROT 7.1 08/22/2018   ALBUMIN 4.5 07/14/2017   CALCIUM 9.4 08/22/2018   GFR 94.10 07/14/2017   Lab Results  Component Value Date   CHOL 150 08/22/2018   Lab Results  Component Value Date   HDL 64 08/22/2018   Lab Results  Component Value Date   LDLCALC 61 08/22/2018   Lab Results  Component Value Date   TRIG 171 (H) 08/22/2018   Lab Results  Component Value Date   CHOLHDL 2.3 08/22/2018   No results found for: HGBA1C    Assessment & Plan:   Problem List Items Addressed This Visit      Cardiovascular and Mediastinum   Essential hypertension - Primary   Relevant Orders   CBC (Completed)   Comprehensive metabolic panel (Completed)   Urinalysis, Routine w reflex microscopic (Completed)     Other   Dyslipidemia   Relevant Orders   LDL cholesterol, direct (Completed)   Lipid panel (Completed)   Healthcare maintenance   Relevant Orders   PSA (Completed)   Elevated LFTs   Relevant Orders   US Abdomen Limited RUQ   Need for shingles vaccine   Relevant Orders    Varicella-zoster vaccine IM (Completed)      No orders of the defined types were placed in this encounter.   Follow-up: Return in about 6 months (around 02/21/2019), or if symptoms worsen or fail to improve.   Encouraged patient to maintain his healthy lifestyle follow-up in 6 months or sooner as needed.

## 2018-08-23 ENCOUNTER — Encounter: Payer: Medicare Other | Admitting: Family Medicine

## 2018-08-23 LAB — COMPREHENSIVE METABOLIC PANEL
AG Ratio: 1.7 (calc) (ref 1.0–2.5)
ALT: 52 U/L — ABNORMAL HIGH (ref 9–46)
AST: 72 U/L — ABNORMAL HIGH (ref 10–35)
Albumin: 4.5 g/dL (ref 3.6–5.1)
Alkaline phosphatase (APISO): 45 U/L (ref 35–144)
BUN: 8 mg/dL (ref 7–25)
CO2: 26 mmol/L (ref 20–32)
Calcium: 9.4 mg/dL (ref 8.6–10.3)
Chloride: 95 mmol/L — ABNORMAL LOW (ref 98–110)
Creat: 0.84 mg/dL (ref 0.70–1.25)
Globulin: 2.6 g/dL (calc) (ref 1.9–3.7)
Glucose, Bld: 97 mg/dL (ref 65–99)
Potassium: 4.4 mmol/L (ref 3.5–5.3)
Sodium: 130 mmol/L — ABNORMAL LOW (ref 135–146)
Total Bilirubin: 0.9 mg/dL (ref 0.2–1.2)
Total Protein: 7.1 g/dL (ref 6.1–8.1)

## 2018-08-23 LAB — URINALYSIS, ROUTINE W REFLEX MICROSCOPIC
Bilirubin Urine: NEGATIVE
Glucose, UA: NEGATIVE
Hgb urine dipstick: NEGATIVE
Ketones, ur: NEGATIVE
Leukocytes,Ua: NEGATIVE
Nitrite: NEGATIVE
Protein, ur: NEGATIVE
Specific Gravity, Urine: 1.009 (ref 1.001–1.03)
pH: 5.5 (ref 5.0–8.0)

## 2018-08-23 LAB — CBC
HCT: 45.2 % (ref 38.5–50.0)
Hemoglobin: 15.6 g/dL (ref 13.2–17.1)
MCH: 32.2 pg (ref 27.0–33.0)
MCHC: 34.5 g/dL (ref 32.0–36.0)
MCV: 93.4 fL (ref 80.0–100.0)
MPV: 9.9 fL (ref 7.5–12.5)
Platelets: 194 10*3/uL (ref 140–400)
RBC: 4.84 10*6/uL (ref 4.20–5.80)
RDW: 11.9 % (ref 11.0–15.0)
WBC: 5.5 10*3/uL (ref 3.8–10.8)

## 2018-08-23 LAB — LIPID PANEL
Cholesterol: 150 mg/dL (ref <200)
HDL: 64 mg/dL (ref >=40)
LDL Cholesterol (Calc): 61 mg/dL (calc)
Non-HDL Cholesterol (Calc): 86 mg/dL (calc) (ref <130)
Total CHOL/HDL Ratio: 2.3 (calc) (ref <5.0)
Triglycerides: 171 mg/dL — ABNORMAL HIGH (ref <150)

## 2018-08-23 LAB — LDL CHOLESTEROL, DIRECT: Direct LDL: 66 mg/dL (ref <100)

## 2018-08-23 LAB — PSA: PSA: 1.2 ng/mL (ref <=4.0)

## 2018-09-02 DIAGNOSIS — Z23 Encounter for immunization: Secondary | ICD-10-CM

## 2018-09-02 DIAGNOSIS — R7989 Other specified abnormal findings of blood chemistry: Secondary | ICD-10-CM

## 2018-09-02 HISTORY — DX: Encounter for immunization: Z23

## 2018-09-02 HISTORY — DX: Other specified abnormal findings of blood chemistry: R79.89

## 2018-09-02 NOTE — Addendum Note (Signed)
Addended by: Jon Billings on: 09/02/2018 11:52 AM   Modules accepted: Orders

## 2018-09-14 ENCOUNTER — Telehealth: Payer: Self-pay

## 2018-09-14 NOTE — Telephone Encounter (Signed)
LMOV about imaging appointment and to call the office for details.

## 2018-09-18 ENCOUNTER — Ambulatory Visit (HOSPITAL_BASED_OUTPATIENT_CLINIC_OR_DEPARTMENT_OTHER): Payer: Medicare Other

## 2018-10-10 ENCOUNTER — Ambulatory Visit (HOSPITAL_BASED_OUTPATIENT_CLINIC_OR_DEPARTMENT_OTHER): Payer: Medicare Other

## 2018-10-23 ENCOUNTER — Telehealth: Payer: Self-pay | Admitting: Behavioral Health

## 2018-10-23 NOTE — Telephone Encounter (Signed)

## 2018-10-24 ENCOUNTER — Ambulatory Visit (INDEPENDENT_AMBULATORY_CARE_PROVIDER_SITE_OTHER): Payer: Medicare Other | Admitting: Behavioral Health

## 2018-10-24 ENCOUNTER — Other Ambulatory Visit: Payer: Self-pay

## 2018-10-24 ENCOUNTER — Encounter: Payer: Self-pay | Admitting: Family Medicine

## 2018-10-24 DIAGNOSIS — Z23 Encounter for immunization: Secondary | ICD-10-CM | POA: Diagnosis not present

## 2018-10-24 NOTE — Progress Notes (Signed)
Patient came in clinic today for 2nd Shingrix & Influenza vaccinations. IM injections were given in both the right and left deltoids. Patient tolerated them well. There were no signs or symptoms of a reaction prior to patient leaving the nurse visit.

## 2018-11-08 ENCOUNTER — Ambulatory Visit (HOSPITAL_BASED_OUTPATIENT_CLINIC_OR_DEPARTMENT_OTHER): Payer: Medicare Other

## 2018-11-14 DIAGNOSIS — Z961 Presence of intraocular lens: Secondary | ICD-10-CM | POA: Diagnosis not present

## 2018-11-14 DIAGNOSIS — H35372 Puckering of macula, left eye: Secondary | ICD-10-CM | POA: Diagnosis not present

## 2018-11-14 DIAGNOSIS — H43811 Vitreous degeneration, right eye: Secondary | ICD-10-CM | POA: Diagnosis not present

## 2018-11-14 DIAGNOSIS — H43812 Vitreous degeneration, left eye: Secondary | ICD-10-CM | POA: Diagnosis not present

## 2018-12-29 ENCOUNTER — Other Ambulatory Visit: Payer: Self-pay | Admitting: Family Medicine

## 2019-01-03 DIAGNOSIS — Z9889 Other specified postprocedural states: Secondary | ICD-10-CM | POA: Diagnosis not present

## 2019-01-03 DIAGNOSIS — M7542 Impingement syndrome of left shoulder: Secondary | ICD-10-CM | POA: Diagnosis not present

## 2019-01-03 DIAGNOSIS — M7552 Bursitis of left shoulder: Secondary | ICD-10-CM | POA: Diagnosis not present

## 2019-01-16 DIAGNOSIS — M7542 Impingement syndrome of left shoulder: Secondary | ICD-10-CM | POA: Diagnosis not present

## 2019-01-16 DIAGNOSIS — M7540 Impingement syndrome of unspecified shoulder: Secondary | ICD-10-CM | POA: Insufficient documentation

## 2019-01-16 HISTORY — DX: Impingement syndrome of unspecified shoulder: M75.40

## 2019-01-16 NOTE — Progress Notes (Addendum)
Virtual Visit via Video Note  I connected with patient on 01/17/19 at 10:15 AM EST by audio enabled telemedicine application and verified that I am speaking with the correct person using two identifiers.   THIS ENCOUNTER IS A VIRTUAL VISIT DUE TO COVID-19 - PATIENT WAS NOT SEEN IN THE OFFICE. PATIENT HAS CONSENTED TO VIRTUAL VISIT / TELEMEDICINE VISIT   Location of patient: home  Location of provider: office  I discussed the limitations of evaluation and management by telemedicine and the availability of in person appointments. The patient expressed understanding and agreed to proceed.   Subjective:   Timothy [name] is a 68 y.o. male who presents for Medicare Annual/Subsequent preventive examination.  Review of Systems:  Home Safety/Smoke Alarms: Feels safe in home. Smoke alarms in place.  Lives with wife in ranch home w/ full basement. No trouble w/ stairs.   Male:   CCS- 02/14/18.  Recall 5 yrs.    PSA-  Lab Results  Component Value Date   PSA 1.2 08/22/2018   PSA 1.07 05/13/2015   PSA 1.06 05/02/2014       Objective:    Vitals: Unable to assess. This visit is enabled though telemedicine due to Covid 19.  Advanced Directives 01/17/2019  Does Patient Have a Medical Advance Directive? Yes  Type of Paramedic of [place];Living will  Does patient want to make changes to medical advance directive? No - Patient declined  Copy of [place] in Chart? No - copy requested    Tobacco Social History   Tobacco Use  Smoking Status Never Smoker  Smokeless Tobacco Current User  . Types: Chew     Ready to quit: Not Answered Counseling given: Not Answered   Clinical Intake: Pain : No/denies pain  Past Medical History:  Diagnosis Date  . Cataract   . Chronic kidney disease    kidney stones  . COLONIC POLYPS, HX OF 02/12/2009  . HYPERLIPIDEMIA 11/21/2007  . Hypertension   . 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  OSTEOARTHRITIS 01/17/2007   Past Surgical History:  Procedure Laterality Date  . APPENDECTOMY    . CARPAL TUNNEL RELEASE    . CATARACT EXTRACTION     june 2019  . HERNIA REPAIR     ingunial  . KNEE ARTHROSCOPY     bilateral  . SHOULDER SURGERY     left  . TOTAL KNEE ARTHROPLASTY     right   Family History  Problem Relation Age of Onset  . Cancer Father        colon ca  . Colon cancer Father   . Esophageal cancer Neg Hx   . Rectal cancer Neg Hx   . Stomach cancer Neg Hx    Social History   Socioeconomic History  . Marital status: Married    Spouse name: Not on file  . Number of children: Not on file  . Years of education: Not on file  . Highest education level: Not on file  Occupational History  . Not on file  Social Needs  . Financial resource strain: Not on file  . Food insecurity    Worry: Not on file    Inability: Not on file  . Transportation needs    Medical: Not on file    Non-medical: Not on file  Tobacco Use  . Smoking status: Never Smoker  . Smokeless tobacco: Current User    Types: Chew  Substance and Sexual Activity  . Alcohol use:  Yes    Alcohol/week: 6.0 - 8.0 standard drinks    Types: 6 - 8 Cans of beer per week  . Drug use: No  . Sexual activity: Not on file  Lifestyle  . Physical activity    Days per week: Not on file    Minutes per session: Not on file  . Stress: Not on file  Relationships  . Social Herbalist on phone: Not on file    Gets together: Not on file    Attends religious service: Not on file    Active member of club or organization: Not on file    Attends meetings of clubs or organizations: Not on file    Relationship status: Not on file  Other Topics Concern  . Not on file  Social History Narrative  . Not on file    Outpatient Encounter Medications as of 01/17/2019  Medication Sig  . aspirin 81 MG tablet Take 162 mg by mouth daily.  Marland Kitchen atorvastatin (LIPITOR) 20 MG tablet TAKE 1 TABLET BY MOUTH EVERY DAY   . lisinopril (ZESTRIL) 20 MG tablet TAKE 1 TABLET BY MOUTH EVERY DAY  . Multiple Vitamin (MULTIVITAMIN) tablet Take 1 tablet by mouth daily.  . tadalafil (CIALIS) 20 MG tablet Take 1 tablet (20 mg total) by mouth daily as needed for erectile dysfunction.   No facility-administered encounter medications on file as of 01/17/2019.     Activities of Daily Living In your present state of health, do you have any difficulty performing the following activities: 01/17/2019  Hearing? Y  Comment wearing hearing aids.  Vision? N  Difficulty concentrating or making decisions? N  Walking or climbing stairs? N  Dressing or bathing? N  Doing errands, shopping? N  Preparing Food and eating ? N  Using the Toilet? N  In the past six months, have you accidently leaked urine? N  Do you have problems with loss of bowel control? N  Managing your Medications? N  Managing your Finances? N  Housekeeping or managing your Housekeeping? N  Some recent data might be hidden    Patient Care Team: Libby Maw, MD as PCP - General (Family Medicine)   Assessment:   This is a routine wellness examination for Timothy [name]. Physical assessment deferred to PCP.  Exercise Activities and Dietary recommendations Current Exercise Habits: Home exercise routine, Type of exercise: strength training/weights(swims), Time (Minutes): > 60, Frequency (Times/Week): 4, Weekly Exercise (Minutes/Week): 0, Exercise limited by: None identified Diet (meal preparation, eat out, water intake, caffeinated beverages, dairy products, fruits and vegetables): in general, a "healthy" diet        Goals    . Maintain healthy active lifestyle.       Fall Risk Fall Risk  01/17/2019 05/20/2015 05/09/2014  Falls in the past year? 0 Yes No  Number falls in past yr: 0 1 -  Injury with Fall? 0 No -  Risk for fall due to : - Impaired balance/gait -  Risk for fall due to: Comment - Slipped on ice -  Follow up Education provided;Falls  prevention discussed - -    Depression Screen PHQ 2/9 Scores 01/17/2019 05/20/2015 05/09/2014  PHQ - 2 Score 0 0 0    Cognitive Function Ad8 score reviewed for issues:  Issues making decisions:no  Less interest in hobbies / activities:no  Repeats questions, stories (family complaining):no  Trouble using ordinary gadgets (microwave, computer, phone):no  Forgets the month or year: no  Mismanaging finances: no  Remembering appts:no  Daily problems with thinking and/or memory:no Ad8 score is=0         Immunization History  Administered Date(s) Administered  . Fluad Quad(high Dose 65+) 10/24/2018  . Influenza Split 01/04/2011  . Influenza Whole 01/19/2007, 11/21/2007, 12/24/2008, 02/13/2010  . Influenza, High Dose Seasonal PF 01/07/2015, 01/30/2016, 12/17/2016, 12/22/2017  . Influenza,inj,Quad PF,6+ Mos 11/14/2012, 01/02/2014  . Pneumococcal Conjugate-13 05/20/2015  . Pneumococcal Polysaccharide-23 12/17/2016  . Tdap 11/14/2012  . Zoster 06/08/2010  . Zoster Recombinat (Shingrix) 08/22/2018, 10/24/2018    Screening Tests Health Maintenance  Topic Date Due  . TETANUS/TDAP  11/15/2022  . COLONOSCOPY  02/15/2023  . INFLUENZA VACCINE  Completed  . Hepatitis C Screening  Completed  . PNA vac Low Risk Adult  Completed       Plan:    Please schedule your next medicare wellness visit with me in 1 yr.  Continue to eat heart healthy diet (full of fruits, vegetables, whole grains, lean protein, water--limit salt, fat, and sugar intake) and increase physical activity as tolerated.  Continue doing brain stimulating activities (puzzles, reading, adult coloring books, staying active) to keep memory sharp.   Bring a copy of your living will and/or healthcare power of attorney to your next office visit.    I have personally reviewed and noted the following in the patient's chart:   . Medical and social history . Use of alcohol, tobacco or illicit drugs  . Current  medications and supplements . Functional ability and status . Nutritional status . Physical activity . Advanced directives . List of other physicians . Hospitalizations, surgeries, and ER visits in previous 12 months . Vitals . Screenings to include cognitive, depression, and falls . Referrals and appointments  In addition, I have reviewed and discussed with patient certain preventive protocols, quality metrics, and best practice recommendations. A written personalized care plan for preventive services as well as general preventive health recommendations were provided to patient.     Shela Nevin, [place]  01/17/2019  Reviewed and agreed.

## 2019-01-17 ENCOUNTER — Ambulatory Visit (INDEPENDENT_AMBULATORY_CARE_PROVIDER_SITE_OTHER): Payer: Medicare Other | Admitting: *Deleted

## 2019-01-17 ENCOUNTER — Encounter: Payer: Self-pay | Admitting: *Deleted

## 2019-01-17 DIAGNOSIS — Z0000 Encounter for general adult medical examination without abnormal findings: Secondary | ICD-10-CM

## 2019-01-17 NOTE — Patient Instructions (Signed)
Please schedule your next medicare wellness visit with me in 1 yr.  Continue to eat heart healthy diet (full of fruits, vegetables, whole grains, lean protein, water--limit salt, fat, and sugar intake) and increase physical activity as tolerated.  Continue doing brain stimulating activities (puzzles, reading, adult coloring books, staying active) to keep memory sharp.   Bring a copy of your living will and/or healthcare power of attorney to your next office visit.   Mr. Timothy [name] , Thank you for taking time to come for your Medicare Wellness Visit. I appreciate your ongoing commitment to your health goals. Please review the following plan we discussed and let me know if I can assist you in the future.   These are the goals we discussed: Goals    . Maintain healthy active lifestyle.       This is a list of the screening recommended for you and due dates:  Health Maintenance  Topic Date Due  . Tetanus Vaccine  11/15/2022  . Colon Cancer Screening  02/15/2023  . Flu Shot  Completed  .  Hepatitis C: One time screening is recommended by Center for Disease Control  (CDC) for  adults born from [birth year] through 1965.   Completed  . Pneumonia vaccines  Completed    Preventive Care 15 Years and Older, Male Preventive care refers to lifestyle choices and visits with your health care provider that can promote health and wellness. This includes:  A yearly physical exam. This is also called an annual well check.  Regular dental and eye exams.  Immunizations.  Screening for certain conditions.  Healthy lifestyle choices, such as diet and exercise. What can I expect for my preventive care visit? Physical exam Your health care provider will check:  Height and weight. These may be used to calculate body mass index (BMI), which is a measurement that tells if you are at a healthy weight.  Heart rate and blood pressure.  Your skin for abnormal spots. Counseling Your health care provider may  ask you questions about:  Alcohol, tobacco, and drug use.  Emotional well-being.  Home and relationship well-being.  Sexual activity.  Eating habits.  History of falls.  Memory and ability to understand (cognition).  Work and work Statistician. What immunizations do I need?  Influenza (flu) vaccine  This is recommended every year. Tetanus, diphtheria, and pertussis (Tdap) vaccine  You may need a Td booster every 10 years. Varicella (chickenpox) vaccine  You may need this vaccine if you have not already been vaccinated. Zoster (shingles) vaccine  You may need this after age 60. Pneumococcal conjugate (PCV13) vaccine  One dose is recommended after age 45. Pneumococcal polysaccharide (PPSV23) vaccine  One dose is recommended after age 71. Measles, mumps, and rubella (MMR) vaccine  You may need at least one dose of MMR if you were born in 1957 or later. You may also need a second dose. Meningococcal conjugate (MenACWY) vaccine  You may need this if you have certain conditions. Hepatitis A vaccine  You may need this if you have certain conditions or if you travel or work in places where you may be exposed to hepatitis A. Hepatitis B vaccine  You may need this if you have certain conditions or if you travel or work in places where you may be exposed to hepatitis B. Haemophilus influenzae type b (Hib) vaccine  You may need this if you have certain conditions. You may receive vaccines as individual doses or as more than one vaccine together  in one shot (combination vaccines). Talk with your health care provider about the risks and benefits of combination vaccines. What tests do I need? Blood tests  Lipid and cholesterol levels. These may be checked every 5 years, or more frequently depending on your overall health.  Hepatitis C test.  Hepatitis B test. Screening  Lung cancer screening. You may have this screening every year starting at age 69 if you have a  30-pack-year history of smoking and currently smoke or have quit within the past 15 years.  Colorectal cancer screening. All adults should have this screening starting at age 51 and continuing until age 52. Your health care provider may recommend screening at age 21 if you are at increased risk. You will have tests every 1-10 years, depending on your results and the type of screening test.  Prostate cancer screening. Recommendations will vary depending on your family history and other risks.  Diabetes screening. This is done by checking your blood sugar (glucose) after you have not eaten for a while (fasting). You may have this done every 1-3 years.  Abdominal aortic aneurysm (AAA) screening. You may need this if you are a current or former smoker.  Sexually transmitted disease (STD) testing. Follow these instructions at home: Eating and drinking  Eat a diet that includes fresh fruits and vegetables, whole grains, lean protein, and low-fat dairy products. Limit your intake of foods with high amounts of sugar, saturated fats, and salt.  Take vitamin and mineral supplements as recommended by your health care provider.  Do not drink alcohol if your health care provider tells you not to drink.  If you drink alcohol: ? Limit how much you have to 0-2 drinks a day. ? Be aware of how much alcohol is in your drink. In the [place] one drink equals one 12 oz bottle of beer (355 mL), one 5 oz glass of wine (148 mL), or one 1 oz glass of hard liquor (44 mL). Lifestyle  Take daily care of your teeth and gums.  Stay active. Exercise for at least 30 minutes on 5 or more days each week.  Do not use any products that contain nicotine or tobacco, such as cigarettes, e-cigarettes, and chewing tobacco. If you need help quitting, ask your health care provider.  If you are sexually active, practice safe sex. Use a condom or other form of protection to prevent STIs (sexually transmitted infections).  Talk  with your health care provider about taking a low-dose aspirin or statin. What's next?  Visit your health care provider once a year for a well check visit.  Ask your health care provider how often you should have your eyes and teeth checked.  Stay up to date on all vaccines. This information is not intended to replace advice given to you by your health care provider. Make sure you discuss any questions you have with your health care provider. Document Released: 03/14/2015 Document Revised: 02/09/2018 Document Reviewed: 02/09/2018 Elsevier Patient Education  2020 [employer].

## 2019-01-24 DIAGNOSIS — M25512 Pain in left shoulder: Secondary | ICD-10-CM | POA: Diagnosis not present

## 2019-01-24 DIAGNOSIS — M67819 Other specified disorders of synovium and tendon, unspecified shoulder: Secondary | ICD-10-CM | POA: Diagnosis not present

## 2019-03-19 DIAGNOSIS — M66829 Spontaneous rupture of other tendons, unspecified upper arm: Secondary | ICD-10-CM | POA: Diagnosis not present

## 2019-03-19 DIAGNOSIS — M25512 Pain in left shoulder: Secondary | ICD-10-CM | POA: Diagnosis not present

## 2019-03-20 DIAGNOSIS — H43811 Vitreous degeneration, right eye: Secondary | ICD-10-CM | POA: Diagnosis not present

## 2019-03-20 DIAGNOSIS — H35372 Puckering of macula, left eye: Secondary | ICD-10-CM | POA: Diagnosis not present

## 2019-03-20 DIAGNOSIS — H43812 Vitreous degeneration, left eye: Secondary | ICD-10-CM | POA: Diagnosis not present

## 2019-03-20 DIAGNOSIS — Z961 Presence of intraocular lens: Secondary | ICD-10-CM | POA: Diagnosis not present

## 2019-03-21 DIAGNOSIS — M25512 Pain in left shoulder: Secondary | ICD-10-CM | POA: Diagnosis not present

## 2019-03-23 DIAGNOSIS — M25512 Pain in left shoulder: Secondary | ICD-10-CM | POA: Diagnosis not present

## 2019-04-09 DIAGNOSIS — L821 Other seborrheic keratosis: Secondary | ICD-10-CM | POA: Diagnosis not present

## 2019-04-09 DIAGNOSIS — L738 Other specified follicular disorders: Secondary | ICD-10-CM | POA: Diagnosis not present

## 2019-04-09 DIAGNOSIS — L570 Actinic keratosis: Secondary | ICD-10-CM | POA: Diagnosis not present

## 2019-04-14 DIAGNOSIS — Z23 Encounter for immunization: Secondary | ICD-10-CM | POA: Diagnosis not present

## 2019-04-18 ENCOUNTER — Ambulatory Visit: Payer: Self-pay | Attending: Internal Medicine

## 2019-04-18 DIAGNOSIS — Z20822 Contact with and (suspected) exposure to covid-19: Secondary | ICD-10-CM

## 2019-04-20 LAB — NOVEL CORONAVIRUS, NAA: SARS-CoV-2, NAA: NOT DETECTED

## 2019-04-21 ENCOUNTER — Telehealth: Payer: Self-pay | Admitting: *Deleted

## 2019-04-21 NOTE — Telephone Encounter (Signed)
Called in requesting COVID-19 test result.    I let him know it was not detected meaning he did not have the virus.

## 2019-04-30 ENCOUNTER — Encounter: Payer: Self-pay | Admitting: Family Medicine

## 2019-05-08 DIAGNOSIS — H35372 Puckering of macula, left eye: Secondary | ICD-10-CM | POA: Diagnosis not present

## 2019-05-08 DIAGNOSIS — H35371 Puckering of macula, right eye: Secondary | ICD-10-CM | POA: Diagnosis not present

## 2019-05-08 DIAGNOSIS — H43811 Vitreous degeneration, right eye: Secondary | ICD-10-CM | POA: Diagnosis not present

## 2019-05-08 DIAGNOSIS — H43812 Vitreous degeneration, left eye: Secondary | ICD-10-CM | POA: Diagnosis not present

## 2019-05-12 DIAGNOSIS — Z23 Encounter for immunization: Secondary | ICD-10-CM | POA: Diagnosis not present

## 2019-05-23 DIAGNOSIS — H35371 Puckering of macula, right eye: Secondary | ICD-10-CM | POA: Diagnosis not present

## 2019-05-24 DIAGNOSIS — Z09 Encounter for follow-up examination after completed treatment for conditions other than malignant neoplasm: Secondary | ICD-10-CM | POA: Diagnosis not present

## 2019-05-24 DIAGNOSIS — H4311 Vitreous hemorrhage, right eye: Secondary | ICD-10-CM | POA: Diagnosis not present

## 2019-05-31 DIAGNOSIS — H35371 Puckering of macula, right eye: Secondary | ICD-10-CM | POA: Diagnosis not present

## 2019-05-31 DIAGNOSIS — H35372 Puckering of macula, left eye: Secondary | ICD-10-CM | POA: Diagnosis not present

## 2019-05-31 DIAGNOSIS — Z09 Encounter for follow-up examination after completed treatment for conditions other than malignant neoplasm: Secondary | ICD-10-CM | POA: Diagnosis not present

## 2019-06-21 ENCOUNTER — Encounter: Payer: Self-pay | Admitting: Family Medicine

## 2019-06-21 ENCOUNTER — Other Ambulatory Visit: Payer: Self-pay | Admitting: Family Medicine

## 2019-06-22 ENCOUNTER — Other Ambulatory Visit: Payer: Self-pay

## 2019-06-22 MED ORDER — LISINOPRIL 20 MG PO TABS: 20.0000 mg | ORAL_TABLET | Freq: Every day | ORAL | 0 refills | Status: DC

## 2019-06-22 MED ORDER — ATORVASTATIN CALCIUM 20 MG PO TABS: 20.0000 mg | ORAL_TABLET | Freq: Every day | ORAL | 0 refills | Status: DC

## 2019-07-04 ENCOUNTER — Other Ambulatory Visit: Payer: Self-pay

## 2019-07-05 ENCOUNTER — Encounter: Payer: Self-pay | Admitting: Family Medicine

## 2019-07-05 ENCOUNTER — Ambulatory Visit: Payer: Medicare Other | Admitting: Family Medicine

## 2019-07-05 ENCOUNTER — Ambulatory Visit (INDEPENDENT_AMBULATORY_CARE_PROVIDER_SITE_OTHER): Payer: Medicare Other | Admitting: Family Medicine

## 2019-07-05 VITALS — BP 142/80 | HR 101 | Temp 97.8°F | Ht 70.0 in | Wt 150.6 lb

## 2019-07-05 DIAGNOSIS — K219 Gastro-esophageal reflux disease without esophagitis: Secondary | ICD-10-CM | POA: Diagnosis not present

## 2019-07-05 DIAGNOSIS — I10 Essential (primary) hypertension: Secondary | ICD-10-CM

## 2019-07-05 DIAGNOSIS — E785 Hyperlipidemia, unspecified: Secondary | ICD-10-CM

## 2019-07-05 DIAGNOSIS — R7989 Other specified abnormal findings of blood chemistry: Secondary | ICD-10-CM | POA: Diagnosis not present

## 2019-07-05 DIAGNOSIS — R748 Abnormal levels of other serum enzymes: Secondary | ICD-10-CM | POA: Diagnosis not present

## 2019-07-05 DIAGNOSIS — R079 Chest pain, unspecified: Secondary | ICD-10-CM | POA: Diagnosis not present

## 2019-07-05 DIAGNOSIS — Z0000 Encounter for general adult medical examination without abnormal findings: Secondary | ICD-10-CM

## 2019-07-05 MED ORDER — PANTOPRAZOLE SODIUM 40 MG PO TBEC: 40.0000 mg | DELAYED_RELEASE_TABLET | Freq: Every day | ORAL | 3 refills | Status: DC

## 2019-07-05 NOTE — Progress Notes (Addendum)
Established Patient Office Visit  Subjective:  Patient ID: Timothy [name], male    DOB: [birth year]  Age: 69 y.o. MRN: [id number]  CC:  Chief Complaint  Patient presents with  . Gastroesophageal Reflux    concerns about acide reflux some nausea and vomiting.     HPI Timothy [name] presents for evaluation of indigestion.  Past medical history of reflux but it has not bothered him in some time now until recently.  He tried a few of his old Protonix and they did not seem to help.  He did have one episode of indigestion associated with vomiting, diaphoresis and shortness of breath.  He has never smoked.  He has a history of controlled hypertension.  History of elevated cholesterol treated with atorvastatin.  Last LDL was 61 with an HDL of 64.  He is accompanied by his wife today.  Wife is recovering from a recent diagnosis and treatment of breast cancer.  She is doing well.  History of elevated liver enzymes.  Had ordered abdominal ultrasound the patient was lost to follow-up as it was about at the time his wife had been diagnosed with breast cancer.  Past Medical History:  Diagnosis Date  . Cataract   . Chronic kidney disease    kidney stones  . COLONIC POLYPS, HX OF 02/12/2009  . HYPERLIPIDEMIA 11/21/2007  . Hypertension   . 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 OSTEOARTHRITIS 01/17/2007    Past Surgical History:  Procedure Laterality Date  . APPENDECTOMY    . CARPAL TUNNEL RELEASE    . CATARACT EXTRACTION     june 2019  . HERNIA REPAIR     ingunial  . KNEE ARTHROSCOPY     bilateral  . SHOULDER SURGERY     left  . TOTAL KNEE ARTHROPLASTY     right    Family History  Problem Relation Age of Onset  . Cancer Father        colon ca  . Colon cancer Father   . Esophageal cancer Neg Hx   . Rectal cancer Neg Hx   . Stomach cancer Neg Hx     Social History   Socioeconomic History  . Marital status: Married    Spouse name: Not on file  . Number of children:  Not on file  . Years of education: Not on file  . Highest education level: Not on file  Occupational History  . Not on file  Tobacco Use  . Smoking status: Never Smoker  . Smokeless tobacco: Current User    Types: Chew  Substance and Sexual Activity  . Alcohol use: Yes    Alcohol/week: 6.0 - 8.0 standard drinks    Types: 6 - 8 Cans of beer per week  . Drug use: No  . Sexual activity: Not on file  Other Topics Concern  . Not on file  Social History Narrative  . Not on file   Social Determinants of Health   Financial Resource Strain:   . Difficulty of Paying Living Expenses:   Food Insecurity:   . Worried About Charity fundraiser in the Last Year:   . Arboriculturist in the Last Year:   Transportation Needs:   . Film/video editor (Medical):   Marland Kitchen Lack of Transportation (Non-Medical):   Physical Activity:   . Days of Exercise per Week:   . Minutes of Exercise per Session:   Stress:   . Feeling of Stress :  Social Connections:   . Frequency of Communication with Friends and Family:   . Frequency of Social Gatherings with Friends and Family:   . Attends Religious Services:   . Active Member of Clubs or Organizations:   . Attends Archivist Meetings:   Marland Kitchen Marital Status:   Intimate Partner Violence:   . Fear of Current or Ex-Partner:   . Emotionally Abused:   Marland Kitchen Physically Abused:   . Sexually Abused:     Outpatient Medications Prior to Visit  Medication Sig Dispense Refill  . atorvastatin (LIPITOR) 20 MG tablet Take 1 tablet (20 mg total) by mouth daily. 90 tablet 0  . lisinopril (ZESTRIL) 20 MG tablet Take 1 tablet (20 mg total) by mouth daily. 90 tablet 0  . aspirin 81 MG tablet Take 162 mg by mouth daily.    . Multiple Vitamin (MULTIVITAMIN) tablet Take 1 tablet by mouth daily.    . tadalafil (CIALIS) 20 MG tablet Take 1 tablet (20 mg total) by mouth daily as needed for erectile dysfunction. (Patient not taking: Reported on 07/05/2019) 6 tablet 12    No facility-administered medications prior to visit.    No Known Allergies  ROS Review of Systems  Constitutional: Negative.   HENT: Negative.   Eyes: Negative for photophobia and visual disturbance.  Respiratory: Negative for chest tightness, shortness of breath and wheezing.   Cardiovascular: Negative for chest pain and palpitations.  Gastrointestinal: Negative for abdominal pain, nausea and vomiting.  Genitourinary: Negative.   Allergic/Immunologic: Negative for immunocompromised state.  Neurological: Negative for weakness and numbness.  Hematological: Does not bruise/bleed easily.  Psychiatric/Behavioral: Negative.       Objective:    Physical Exam  Constitutional: He is oriented to person, place, and time. He appears well-developed and well-nourished. No distress.  HENT:  Head: Normocephalic and atraumatic.  Right Ear: External ear normal.  Left Ear: External ear normal.  Eyes: Conjunctivae are normal. Right eye exhibits no discharge. Left eye exhibits no discharge. No scleral icterus.  Neck: No JVD present. No tracheal deviation present. No thyromegaly present.  Cardiovascular: Normal rate, regular rhythm and normal heart sounds.  Pulmonary/Chest: Effort normal and breath sounds normal. No stridor.  Musculoskeletal:        General: No tenderness.  Lymphadenopathy:    He has no cervical adenopathy.  Neurological: He is alert and oriented to person, place, and time.  Skin: Skin is warm and dry. He is not diaphoretic.  Psychiatric: He has a normal mood and affect. His behavior is normal.    BP (!) 142/80   Pulse (!) 101   Temp 97.8 F (36.6 C) (Tympanic)   Ht 5\' 10"  (1.778 m)   Wt 150 lb 9.6 oz (68.3 kg)   SpO2 96%   BMI 21.61 kg/m  Wt Readings from Last 3 Encounters:  07/05/19 150 lb 9.6 oz (68.3 kg)  08/22/18 164 lb 6 oz (74.6 kg)  02/14/18 166 lb (75.3 kg)     Health Maintenance Due  Topic Date Due  . COVID-19 Vaccine (1) Never done    There  are no preventive care reminders to display for this patient.  Lab Results  Component Value Date   TSH 0.81 07/14/2017   Lab Results  Component Value Date   WBC 5.3 07/06/2019   HGB 15.6 07/06/2019   HCT 41.0 07/06/2019   MCV 97.2 07/06/2019   PLT 352 07/06/2019   Lab Results  Component Value Date   NA 128 (L)  07/06/2019   K 5.1 07/06/2019   CO2 25 07/06/2019   GLUCOSE 107 (H) 07/06/2019   BUN 12 07/06/2019   CREATININE 1.40 07/06/2019   BILITOT 1.4 (H) 07/06/2019   ALKPHOS 119 (H) 07/06/2019   AST 100 (H) 07/06/2019   ALT 46 07/06/2019   PROT 7.0 07/06/2019   ALBUMIN 4.0 07/06/2019   CALCIUM 9.6 07/06/2019   GFR 50.31 (L) 07/06/2019   Lab Results  Component Value Date   CHOL 753 (H) 07/06/2019   Lab Results  Component Value Date   HDL 24.40 (L) 07/06/2019   Lab Results  Component Value Date   LDLCALC 61 08/22/2018   Lab Results  Component Value Date   TRIG 4,102.0 (H) 07/06/2019   Lab Results  Component Value Date   CHOLHDL 31 07/06/2019   No results found for: HGBA1C    Assessment & Plan:   Problem List Items Addressed This Visit      Cardiovascular and Mediastinum   Essential hypertension   Relevant Orders   CBC   Comprehensive metabolic panel (Completed)   Urinalysis, Routine w reflex microscopic (Completed)     Other   Dyslipidemia   Relevant Orders   LDL cholesterol, direct (Completed)   Lipid panel (Completed)   Lipid panel   Amylase   Lipase   US Abdomen Complete   Healthcare maintenance   Relevant Orders   CBC   Comprehensive metabolic panel (Completed)   PSA (Completed)   Elevated LFTs   Relevant Orders   US Abdomen Complete   CBC   Comprehensive metabolic panel (Completed)   Amylase   Lipase   US Abdomen Complete    Other Visit Diagnoses    Chest pain, unspecified type    -  Primary   Relevant Orders   Ambulatory referral to Cardiology   EKG 12-Lead (Completed)   CBC   LDL cholesterol, direct (Completed)   Lipid  panel (Completed)   Gastroesophageal reflux disease, unspecified whether esophagitis present       Relevant Medications   pantoprazole (PROTONIX) 40 MG tablet   Increased liver enzymes       Relevant Orders   Gamma GT (Completed)      Meds ordered this encounter  Medications  . pantoprazole (PROTONIX) 40 MG tablet    Sig: Take 1 tablet (40 mg total) by mouth daily.    Dispense:  30 tablet    Refill:  3    Follow-up: Return has appt for physical in June.Libby Maw, MD

## 2019-07-06 ENCOUNTER — Other Ambulatory Visit: Payer: Medicare Other

## 2019-07-06 ENCOUNTER — Other Ambulatory Visit (INDEPENDENT_AMBULATORY_CARE_PROVIDER_SITE_OTHER): Payer: Medicare Other

## 2019-07-06 DIAGNOSIS — Z0000 Encounter for general adult medical examination without abnormal findings: Secondary | ICD-10-CM | POA: Diagnosis not present

## 2019-07-06 DIAGNOSIS — R079 Chest pain, unspecified: Secondary | ICD-10-CM | POA: Diagnosis not present

## 2019-07-06 DIAGNOSIS — E785 Hyperlipidemia, unspecified: Secondary | ICD-10-CM | POA: Diagnosis not present

## 2019-07-06 DIAGNOSIS — R748 Abnormal levels of other serum enzymes: Secondary | ICD-10-CM | POA: Diagnosis not present

## 2019-07-06 DIAGNOSIS — R7989 Other specified abnormal findings of blood chemistry: Secondary | ICD-10-CM

## 2019-07-06 DIAGNOSIS — I10 Essential (primary) hypertension: Secondary | ICD-10-CM

## 2019-07-06 DIAGNOSIS — Z125 Encounter for screening for malignant neoplasm of prostate: Secondary | ICD-10-CM | POA: Diagnosis not present

## 2019-07-06 LAB — COMPREHENSIVE METABOLIC PANEL
ALT: 46 U/L (ref 0–53)
AST: 100 U/L — ABNORMAL HIGH (ref 0–37)
Albumin: 4 g/dL (ref 3.5–5.2)
Alkaline Phosphatase: 119 U/L — ABNORMAL HIGH (ref 39–117)
BUN: 12 mg/dL (ref 6–23)
CO2: 25 mEq/L (ref 19–32)
Calcium: 9.6 mg/dL (ref 8.4–10.5)
Chloride: 95 mEq/L — ABNORMAL LOW (ref 96–112)
Creatinine, Ser: 1.4 mg/dL (ref 0.40–1.50)
GFR: 50.31 mL/min — ABNORMAL LOW (ref >60.00)
Glucose, Bld: 107 mg/dL — ABNORMAL HIGH (ref 70–99)
Potassium: 5.1 mEq/L (ref 3.5–5.1)
Sodium: 128 mEq/L — ABNORMAL LOW (ref 135–145)
Total Bilirubin: 1.4 mg/dL — ABNORMAL HIGH (ref 0.2–1.2)
Total Protein: 7 g/dL (ref 6.0–8.3)

## 2019-07-06 LAB — LIPID PANEL
Cholesterol: 753 mg/dL — ABNORMAL HIGH (ref 0–200)
HDL: 24.4 mg/dL — ABNORMAL LOW (ref >39.00)
Total CHOL/HDL Ratio: 31
Triglycerides: 4102 mg/dL — ABNORMAL HIGH (ref 0.0–149.0)

## 2019-07-06 LAB — URINALYSIS, ROUTINE W REFLEX MICROSCOPIC
Bilirubin Urine: NEGATIVE
Hgb urine dipstick: NEGATIVE
Ketones, ur: NEGATIVE
Leukocytes,Ua: NEGATIVE
Nitrite: NEGATIVE
Specific Gravity, Urine: 1.015 (ref 1.000–1.030)
Total Protein, Urine: NEGATIVE
Urine Glucose: NEGATIVE
Urobilinogen, UA: 0.2 (ref 0.0–1.0)
pH: 6.5 (ref 5.0–8.0)

## 2019-07-06 LAB — LDL CHOLESTEROL, DIRECT: Direct LDL: 143 mg/dL

## 2019-07-06 LAB — PSA: PSA: 0.73 ng/mL (ref 0.10–4.00)

## 2019-07-06 LAB — GAMMA GT: GGT: 1000 U/L — ABNORMAL HIGH (ref 7–51)

## 2019-07-07 LAB — CBC
HCT: 41 % (ref 38.5–50.0)
Hemoglobin: 15.6 g/dL (ref 13.2–17.1)
MCH: 37 pg — ABNORMAL HIGH (ref 27.0–33.0)
MCHC: 38 g/dL — ABNORMAL HIGH (ref 32.0–36.0)
MCV: 97.2 fL (ref 80.0–100.0)
MPV: 12.4 fL (ref 7.5–12.5)
Platelets: 352 10*3/uL (ref 140–400)
RBC: 4.22 10*6/uL (ref 4.20–5.80)
RDW: 13 % (ref 11.0–15.0)
WBC: 5.3 10*3/uL (ref 3.8–10.8)

## 2019-07-09 NOTE — Progress Notes (Signed)
Spoke with patient scheduled repeat labs. Patient aware that U/S appointment is needed and states that he never received a call to schedule the U/S. New referral in patient will await on call.

## 2019-07-09 NOTE — Addendum Note (Signed)
Addended by: Jon Billings on: 07/09/2019 09:03 AM   Modules accepted: Orders

## 2019-07-10 ENCOUNTER — Other Ambulatory Visit: Payer: Self-pay

## 2019-07-11 ENCOUNTER — Other Ambulatory Visit (INDEPENDENT_AMBULATORY_CARE_PROVIDER_SITE_OTHER): Payer: Medicare Other

## 2019-07-11 DIAGNOSIS — E785 Hyperlipidemia, unspecified: Secondary | ICD-10-CM

## 2019-07-11 DIAGNOSIS — R7989 Other specified abnormal findings of blood chemistry: Secondary | ICD-10-CM

## 2019-07-11 LAB — LDL CHOLESTEROL, DIRECT: Direct LDL: 75 mg/dL

## 2019-07-11 LAB — LIPID PANEL
Cholesterol: 384 mg/dL — ABNORMAL HIGH (ref 0–200)
HDL: 30 mg/dL — ABNORMAL LOW (ref >39.00)
Total CHOL/HDL Ratio: 13
Triglycerides: 947 mg/dL — ABNORMAL HIGH (ref 0.0–149.0)

## 2019-07-11 LAB — LIPASE: Lipase: 38 U/L (ref 11.0–59.0)

## 2019-07-11 LAB — AMYLASE: Amylase: 79 U/L (ref 27–131)

## 2019-07-12 MED ORDER — FENOFIBRATE 145 MG PO TABS: 145.0000 mg | ORAL_TABLET | Freq: Every day | ORAL | 1 refills | Status: DC

## 2019-07-12 NOTE — Addendum Note (Signed)
Addended by: Jon Billings on: 07/12/2019 09:42 AM   Modules accepted: Orders

## 2019-07-13 ENCOUNTER — Encounter: Payer: Self-pay | Admitting: Cardiology

## 2019-07-13 ENCOUNTER — Ambulatory Visit (INDEPENDENT_AMBULATORY_CARE_PROVIDER_SITE_OTHER): Payer: Medicare Other | Admitting: Cardiology

## 2019-07-13 ENCOUNTER — Other Ambulatory Visit: Payer: Self-pay

## 2019-07-13 VITALS — BP 124/74 | HR 95 | Ht 70.0 in | Wt 154.0 lb

## 2019-07-13 DIAGNOSIS — I10 Essential (primary) hypertension: Secondary | ICD-10-CM | POA: Diagnosis not present

## 2019-07-13 DIAGNOSIS — E785 Hyperlipidemia, unspecified: Secondary | ICD-10-CM | POA: Diagnosis not present

## 2019-07-13 DIAGNOSIS — R0789 Other chest pain: Secondary | ICD-10-CM

## 2019-07-13 HISTORY — DX: Other chest pain: R07.89

## 2019-07-13 NOTE — Progress Notes (Signed)
Cardiology Consultation:    Date:  07/13/2019   ID:  Timothy [name], DOB 12/04/1949, MRN [id number]  PCP:  Libby Maw, MD  Cardiologist:  [name], MD   Referring MD: Libby Maw,*   Chief Complaint  Patient presents with  . New Patient (Initial Visit)  I have a chest pain  History of Present Illness:    Timothy [name] is a 69 y.o. male who is being seen today for the evaluation of chest pain at the request of Libby Maw,*.  He was referred to [place] because he had 2 episode of chest pain.  Last episode that he had happened about 2 weeks ago.  He described heavy like sensation in the chest grading 5 and scale up to 10 sometimes stronger that lasted continuously for few days.  There is no aggravating or relieving factor.  He did have some nausea associated with this sensation.  He also lost his appetite he did not want to eat anything.  Walking or exercise did not provoke the pain.  He also describes similar episodes about 3 years ago.  No work-up has been done at that time. He is to be very active he is to go to the gym on a regular basis however because of COVID-19 he does not do it he lost significant amount of weight up to 20 pounds and he attributes this to the fact that he does not go to gym.  It is kind of strange the way he explained it but he tells me that if he does not go to gym he does not eat much.  His wife was with him in the room confirmed that. He does have essential hypertension, he does have dyslipidemia, he used to smoke but quit smoking many years ago.  Does not exercise on a regular basis right now.  Does not have family history of premature coronary artery disease. Past Medical History:  Diagnosis Date  . Cataract   . Chronic kidney disease    kidney stones  . COLONIC POLYPS, HX OF 02/12/2009  . Dyslipidemia 11/21/2007   Qualifier: Diagnosis of  By: Burnice Logan  MD, Doretha Sou   . Elevated LFTs 09/02/2018  . Essential  hypertension 04/07/2016   Treatment initiated February 2018  . Healthcare maintenance 08/22/2018  . 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 HYPERLIPIDEMIA 11/21/2007  . Hypertension   . Need for influenza vaccination 12/22/2017  . Need for shingles vaccine 09/02/2018  . 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 OSTEOARTHRITIS 01/17/2007  . Osteoarthritis 01/17/2007   Qualifier: Diagnosis of  By: Burnice Logan  MD, Doretha Sou   . Subacromial impingement 01/16/2019    Past Surgical History:  Procedure Laterality Date  . APPENDECTOMY    . CARPAL TUNNEL RELEASE    . CATARACT EXTRACTION     june 2019  . HERNIA REPAIR     ingunial  . KNEE ARTHROSCOPY     bilateral  . SHOULDER SURGERY     left  . TOTAL KNEE ARTHROPLASTY     right    Current Medications: Current Meds  Medication Sig  . atorvastatin (LIPITOR) 20 MG tablet Take 1 tablet (20 mg total) by mouth daily.  . fenofibrate (TRICOR) 145 MG tablet Take 1 tablet (145 mg total) by mouth daily.  Marland Kitchen lisinopril (ZESTRIL) 20 MG tablet Take 1 tablet (20 mg total) by mouth daily.  . Multiple Vitamin (MULTIVITAMIN) tablet Take 1 tablet by mouth daily.  Marland Kitchen  pantoprazole (PROTONIX) 40 MG tablet Take 1 tablet (40 mg total) by mouth daily.     Allergies:   Patient has no known allergies.   Social History   Socioeconomic History  . Marital status: Married    Spouse name: Not on file  . Number of children: Not on file  . Years of education: Not on file  . Highest education level: Not on file  Occupational History  . Not on file  Tobacco Use  . Smoking status: Never Smoker  . Smokeless tobacco: Current User    Types: Chew  Substance and Sexual Activity  . Alcohol use: Yes    Alcohol/week: 6.0 - 8.0 standard drinks    Types: 6 - 8 Cans of beer per week  . Drug use: No  . Sexual activity: Not on file  Other Topics Concern  . Not on file  Social History Narrative  . Not on file   Social Determinants of Health   Financial Resource  Strain:   . Difficulty of Paying Living Expenses:   Food Insecurity:   . Worried About Charity fundraiser in the Last Year:   . Arboriculturist in the Last Year:   Transportation Needs:   . Film/video editor (Medical):   Marland Kitchen Lack of Transportation (Non-Medical):   Physical Activity:   . Days of Exercise per Week:   . Minutes of Exercise per Session:   Stress:   . Feeling of Stress :   Social Connections:   . Frequency of Communication with Friends and Family:   . Frequency of Social Gatherings with Friends and Family:   . Attends Religious Services:   . Active Member of Clubs or Organizations:   . Attends Archivist Meetings:   Marland Kitchen Marital Status:      Family History: The patient's family history includes Cancer in his father; Colon cancer in his father. There is no history of Esophageal cancer, Rectal cancer, or Stomach cancer. ROS:   Please see the history of present illness.    All 14 point review of systems negative except as described per history of present illness.  EKGs/Labs/Other Studies Reviewed:    The following studies were reviewed today:   EKG:  EKG is  ordered today.  The ekg ordered today demonstrates normal sinus rhythm, normal P interval, normal QS complex duration morphology.  Recent Labs: 07/06/2019: ALT 46; BUN 12; Creatinine, Ser 1.40; Hemoglobin 15.6; Platelets 352; Potassium 5.1; Sodium 128  Recent Lipid Panel    Component Value Date/Time   CHOL 384 (H) 07/11/2019 0829   TRIG (H) 07/11/2019 0829    947.0 Triglyceride is over 400; calculations on Lipids are invalid.   HDL 30.00 (L) 07/11/2019 0829   CHOLHDL 13 07/11/2019 0829   VLDL 18.0 07/14/2017 1112   LDLCALC 61 08/22/2018 1109   LDLDIRECT 75.0 07/11/2019 0829    Physical Exam:    VS:  BP 124/74   Pulse 95   Ht 5\' 10"  (1.778 m)   Wt 154 lb (69.9 kg)   SpO2 98%   BMI 22.10 kg/m     Wt Readings from Last 3 Encounters:  07/13/19 154 lb (69.9 kg)  07/05/19 150 lb 9.6 oz  (68.3 kg)  08/22/18 164 lb 6 oz (74.6 kg)     GEN:  Well nourished, well developed in no acute distress HEENT: Normal NECK: No JVD; No carotid bruits LYMPHATICS: No lymphadenopathy CARDIAC: RRR, no murmurs, no rubs, no gallops  RESPIRATORY:  Clear to auscultation without rales, wheezing or rhonchi  ABDOMEN: Soft, non-tender, non-distended MUSCULOSKELETAL:  No edema; No deformity  SKIN: Warm and dry NEUROLOGIC:  Alert and oriented x 3 PSYCHIATRIC:  Normal affect   ASSESSMENT:    1. Dyslipidemia   2. Atypical chest pain   3. Essential hypertension    PLAN:    In order of problems listed above:  1. Atypical chest pain.  Lasting for few days and constant not related to exercise I doubt very much that those symptoms are related to his heart however he does have some risk factors for coronary artery disease and probably will need to look at his heart to make sure he does not have any significant coronary artery disease.  I talked to him about options in terms of weight and we can evaluate him.  We talked about stress test versus coronary CT angio.  He is scheduled to have ultrasounds of his abdomen Monday and I will wait for results of this test to decide which way to go in terms of assessment of his coronary arteries. 2. Looking at his laboratory test and very high triglycerides I am worried that he may have had pancreatitis.  I do see a lipase that he had done and those tests were normal.  Again I think appropriate is to wait for ultrasounds of his abdomen before pursuing cardiac work-up. 3. Essential hypertension blood pressure appears to well controlled continue present management. 4. Dyslipidemia he is on statin which I will continue, just started with TriCor because of high triglycerides.  In the future he may benefit from referral to lipid clinic. 5. Weight loss which obviously is concerning he thinks that weight loss is related to the fact that he does not exercise which is kind of  peculiar explanation he said that he does not eat much when he does not exercise.   Overall complicated gentleman who came to me because of epigastric/chest pain.  Likely on physical examination his abdomen is benign nontender no rebound tenderness no epigastric tenderness.  Important findings laboratory test is the fact that his triglycerides were very high however at the same time test for pancreatitis are negative.  Awaiting results of ultrasounds of his abdomen to decide about which way we going to pursue cardiac work-up.  However, based on the story he told me today have right lower lobe suspicion with dealing with coronary artery disease.   Medication Adjustments/Labs and Tests Ordered: Current medicines are reviewed at length with the patient today.  Concerns regarding medicines are outlined above.  No orders of the defined types were placed in this encounter.  No orders of the defined types were placed in this encounter.   Signed, Park Liter, MD, [facility]. 07/13/2019 11:03 AM    [place]

## 2019-07-13 NOTE — Patient Instructions (Signed)
Medication Instructions:  Your physician recommends that you continue on your current medications as directed. Please refer to the Current Medication list given to you today.  *If you need a refill on your cardiac medications before your next appointment, please call your pharmacy*   Lab Work: None If you have labs (blood work) drawn today and your tests are completely normal, you will receive your results only by: MyChart Message (if you have MyChart) OR A paper copy in the mail If you have any lab test that is abnormal or we need to change your treatment, we will call you to review the results.   Testing/Procedures: None   Follow-Up: At [facility], you and your health needs are our priority.  As part of our continuing mission to provide you with exceptional heart care, we have created designated Provider Care Teams.  These Care Teams include your primary Cardiologist (physician) and Advanced Practice Providers (APPs -  Physician Assistants and Nurse Practitioners) who all work together to provide you with the care you need, when you need it.  We recommend signing up for the patient portal called "MyChart".  Sign up information is provided on this After Visit Summary.  MyChart is used to connect with patients for Virtual Visits (Telemedicine).  Patients are able to view lab/test results, encounter notes, upcoming appointments, etc.  Non-urgent messages can be sent to your provider as well.   To learn more about what you can do with MyChart, go to [email or web address].    Your next appointment:   1 month(s)  The format for your next appointment:   In Person  Provider:   Robert Krasowski, MD   Other Instructions   

## 2019-07-16 ENCOUNTER — Ambulatory Visit
Admission: RE | Admit: 2019-07-16 | Discharge: 2019-07-16 | Disposition: A | Discharge status: Home / Self Care | Payer: Medicare Other | Source: Ambulatory Visit | Attending: Family Medicine | Admitting: Family Medicine

## 2019-07-16 DIAGNOSIS — E785 Hyperlipidemia, unspecified: Secondary | ICD-10-CM

## 2019-07-16 DIAGNOSIS — R7989 Other specified abnormal findings of blood chemistry: Secondary | ICD-10-CM

## 2019-07-18 ENCOUNTER — Encounter: Payer: Self-pay | Admitting: Cardiology

## 2019-07-19 ENCOUNTER — Encounter (INDEPENDENT_AMBULATORY_CARE_PROVIDER_SITE_OTHER): Payer: Self-pay | Admitting: Ophthalmology

## 2019-07-19 ENCOUNTER — Other Ambulatory Visit: Payer: Self-pay

## 2019-07-19 ENCOUNTER — Ambulatory Visit (INDEPENDENT_AMBULATORY_CARE_PROVIDER_SITE_OTHER): Payer: Medicare Other | Admitting: Ophthalmology

## 2019-07-19 DIAGNOSIS — H35371 Puckering of macula, right eye: Secondary | ICD-10-CM

## 2019-07-19 DIAGNOSIS — H43812 Vitreous degeneration, left eye: Secondary | ICD-10-CM

## 2019-07-19 DIAGNOSIS — Z09 Encounter for follow-up examination after completed treatment for conditions other than malignant neoplasm: Secondary | ICD-10-CM | POA: Insufficient documentation

## 2019-07-19 DIAGNOSIS — H35372 Puckering of macula, left eye: Secondary | ICD-10-CM

## 2019-07-19 HISTORY — DX: Puckering of macula, right eye: H35.371

## 2019-07-19 HISTORY — DX: Vitreous degeneration, left eye: H43.812

## 2019-07-19 HISTORY — DX: Puckering of macula, left eye: H35.372

## 2019-07-19 HISTORY — DX: Encounter for follow-up examination after completed treatment for conditions other than malignant neoplasm: Z09

## 2019-07-19 NOTE — Progress Notes (Signed)
07/19/2019     CHIEF COMPLAINT Patient presents for Post-op Follow-up   HISTORY OF PRESENT ILLNESS: Timothy [name] is a 69 y.o. male who presents to the clinic today for:   HPI    Post-op Follow-up    In right eye.  Discomfort includes none.  Vision is improved.  I, the attending physician,  performed the HPI with the patient and updated documentation appropriately.          Comments    7 Week s\p vit ilm peel OD,,    Pt states OD vision has greatly improved. Pt states he can tell OS vision is not as good as OD.        Last edited by Hurman Horn, MD on 07/19/2019  9:14 AM. (History)      Referring physician: Libby Maw, MD [place],  [place] [address]  HISTORICAL INFORMATION:   Selected notes from the MEDICAL RECORD NUMBER       CURRENT MEDICATIONS: No current outpatient medications on file. (Ophthalmic Drugs)   No current facility-administered medications for this visit. (Ophthalmic Drugs)   Current Outpatient Medications (Other)  Medication Sig  . aspirin 81 MG tablet Take 162 mg by mouth daily.  Marland Kitchen atorvastatin (LIPITOR) 20 MG tablet Take 1 tablet (20 mg total) by mouth daily.  . fenofibrate (TRICOR) 145 MG tablet Take 1 tablet (145 mg total) by mouth daily.  Marland Kitchen lisinopril (ZESTRIL) 20 MG tablet Take 1 tablet (20 mg total) by mouth daily.  . Multiple Vitamin (MULTIVITAMIN) tablet Take 1 tablet by mouth daily.  . pantoprazole (PROTONIX) 40 MG tablet Take 1 tablet (40 mg total) by mouth daily.   No current facility-administered medications for this visit. (Other)      REVIEW OF SYSTEMS:    ALLERGIES No Known Allergies  PAST MEDICAL HISTORY Past Medical History:  Diagnosis Date  . Cataract   . Chronic kidney disease    kidney stones  . COLONIC POLYPS, HX OF 02/12/2009  . Dyslipidemia 11/21/2007   Qualifier: Diagnosis of  By: Burnice Logan  MD, Doretha Sou   . Elevated LFTs 09/02/2018  . Essential hypertension  04/07/2016   Treatment initiated February 2018  . Healthcare maintenance 08/22/2018  . 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 HYPERLIPIDEMIA 11/21/2007  . Hypertension   . Need for influenza vaccination 12/22/2017  . Need for shingles vaccine 09/02/2018  . 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 OSTEOARTHRITIS 01/17/2007  . Osteoarthritis 01/17/2007   Qualifier: Diagnosis of  By: Burnice Logan  MD, Doretha Sou   . Subacromial impingement 01/16/2019   Past Surgical History:  Procedure Laterality Date  . APPENDECTOMY    . CARPAL TUNNEL RELEASE    . CATARACT EXTRACTION     june 2019  . HERNIA REPAIR     ingunial  . KNEE ARTHROSCOPY     bilateral  . SHOULDER SURGERY     left  . TOTAL KNEE ARTHROPLASTY     right    FAMILY HISTORY Family History  Problem Relation Age of Onset  . Cancer Father        colon ca  . Colon cancer Father   . Esophageal cancer Neg Hx   . Rectal cancer Neg Hx   . Stomach cancer Neg Hx     SOCIAL HISTORY Social History   Tobacco Use  . Smoking status: Never Smoker  . Smokeless tobacco: Current User    Types: Chew  Substance Use  Topics  . Alcohol use: Yes    Alcohol/week: 6.0 - 8.0 standard drinks    Types: 6 - 8 Cans of beer per week  . Drug use: No         OPHTHALMIC EXAM:  Base Eye Exam    Visual Acuity (Snellen - Linear)      Right Left   Dist [place] 20/20 -1 20/25 -1       Tonometry (Tonopen, 8:58 AM)      Right Left   Pressure 15 16       Pupils      Pupils Dark Light Shape React APD   Right PERRL 3 3 Round Minimal None   Left PERRL 3 3 Round Minimal None       Visual Fields (Counting fingers)      Left Right    Full Full       Neuro/Psych    Oriented x3: Yes   Mood/Affect: Normal       Dilation    Right eye: 1.0% Mydriacyl, 2.5% Phenylephrine @ 8:58 AM        Slit Lamp and Fundus Exam    External Exam      Right Left   External Normal Normal       Slit Lamp Exam      Right Left   Lids/Lashes Normal  Normal   Conjunctiva/Sclera White and quiet White and quiet   Cornea Clear Clear   Anterior Chamber Deep and quiet Deep and quiet   Iris Round and reactive Round and reactive   Lens Posterior chamber intraocular lens Posterior chamber intraocular lens   Anterior Vitreous Normal Normal       Fundus Exam      Right Left   Posterior Vitreous Vitrectomized    Disc Normal    C/D Ratio 0.0    Macula No topo distortion    Vessels Normal    Periphery Normal           IMAGING AND PROCEDURES  Imaging and Procedures for 07/19/19  OCT, Retina - OU - Both Eyes       Right Eye Scan locations included subfoveal. Central Foveal Thickness: 396. Progression has improved. Findings include abnormal foveal contour.   Left Eye Quality was good. Scan locations included subfoveal. Central Foveal Thickness: 426. Progression has been stable.   Notes Now 7 weeks status post vitrectomy membrane peel OD for epiretinal membrane.  Visual acuity improving and topographic distortion much improved.  OS epiretinal membrane with macular thickening of topographic distortion                ASSESSMENT/PLAN:  No problem-specific Assessment & Plan notes found for this encounter.      ICD-10-CM   1. Follow-up examination after eye surgery  Z09   2. Right epiretinal membrane  H35.371 OCT, Retina - OU - Both Eyes  3. Left epiretinal membrane  H35.372 OCT, Retina - OU - Both Eyes  4. Posterior vitreous detachment of left eye  H43.812     1.  OD much improved visually and anatomically status post of vitrectomy ILM peel.  2.  Patient's visual acuity is improved so much in the right eye now he is entertaining and wishes to consider similar surgery on the left eye.  I would like to wait till after the Labor Day holiday.  3.  Examination next in 4 months to evaluate the left eye.  Ophthalmic Meds Ordered this visit:  No orders  of the defined types were placed in this encounter.      Return for  DILATE OU, OCT, COLOR FP.  There are no Patient Instructions on file for this visit.   Explained the diagnoses, plan, and follow up with the patient and they expressed understanding.  Patient expressed understanding of the importance of proper follow up care.   Clent Demark Elza Varricchio M.D. Diseases & Surgery of the Retina and Vitreous Retina & Diabetic [place] 07/19/19     Abbreviations: M myopia (nearsighted); A astigmatism; H hyperopia (farsighted); P presbyopia; Mrx spectacle prescription;  CTL contact lenses; OD right eye; OS left eye; OU both eyes  XT exotropia; ET esotropia; PEK punctate epithelial keratitis; PEE punctate epithelial erosions; DES dry eye syndrome; MGD meibomian gland dysfunction; ATs artificial tears; PFAT's preservative free artificial tears; [place] nuclear sclerotic cataract; PSC posterior subcapsular cataract; ERM epi-retinal membrane; PVD posterior vitreous detachment; RD retinal detachment; DM diabetes mellitus; DR diabetic retinopathy; NPDR non-proliferative diabetic retinopathy; PDR proliferative diabetic retinopathy; CSME clinically significant macular edema; DME diabetic macular edema; dbh dot blot hemorrhages; CWS cotton wool spot; POAG primary open angle glaucoma; C/D cup-to-disc ratio; HVF humphrey visual field; GVF goldmann visual field; OCT optical coherence tomography; IOP intraocular pressure; BRVO Branch retinal vein occlusion; CRVO central retinal vein occlusion; CRAO central retinal artery occlusion; BRAO branch retinal artery occlusion; RT retinal tear; SB scleral buckle; PPV pars plana vitrectomy; VH Vitreous hemorrhage; PRP panretinal laser photocoagulation; IVK intravitreal kenalog; VMT vitreomacular traction; MH Macular hole;  NVD neovascularization of the disc; NVE neovascularization elsewhere; AREDS age related eye disease study; ARMD age related macular degeneration; POAG primary open angle glaucoma; EBMD epithelial/anterior basement membrane dystrophy; ACIOL  anterior chamber intraocular lens; IOL intraocular lens; PCIOL posterior chamber intraocular lens; Phaco/IOL phacoemulsification with intraocular lens placement; [place] photorefractive keratectomy; LASIK laser assisted in situ keratomileusis; HTN hypertension; DM diabetes mellitus; COPD chronic obstructive pulmonary disease

## 2019-07-20 ENCOUNTER — Ambulatory Visit
Admission: RE | Admit: 2019-07-20 | Discharge: 2019-07-20 | Disposition: A | Discharge status: Home / Self Care | Payer: Medicare Other | Source: Ambulatory Visit | Attending: Family Medicine | Admitting: Family Medicine

## 2019-07-20 DIAGNOSIS — K760 Fatty (change of) liver, not elsewhere classified: Secondary | ICD-10-CM | POA: Diagnosis not present

## 2019-07-20 DIAGNOSIS — R748 Abnormal levels of other serum enzymes: Secondary | ICD-10-CM | POA: Diagnosis not present

## 2019-08-10 DIAGNOSIS — D485 Neoplasm of uncertain behavior of skin: Secondary | ICD-10-CM | POA: Diagnosis not present

## 2019-08-10 DIAGNOSIS — L237 Allergic contact dermatitis due to plants, except food: Secondary | ICD-10-CM | POA: Diagnosis not present

## 2019-08-15 ENCOUNTER — Ambulatory Visit (INDEPENDENT_AMBULATORY_CARE_PROVIDER_SITE_OTHER): Payer: Medicare Other | Admitting: Cardiology

## 2019-08-15 ENCOUNTER — Other Ambulatory Visit: Payer: Self-pay

## 2019-08-15 ENCOUNTER — Encounter: Payer: Self-pay | Admitting: Cardiology

## 2019-08-15 ENCOUNTER — Ambulatory Visit: Payer: Medicare Other | Admitting: Cardiology

## 2019-08-15 VITALS — BP 148/74 | HR 98 | Ht 70.0 in | Wt 154.0 lb

## 2019-08-15 DIAGNOSIS — E781 Pure hyperglyceridemia: Secondary | ICD-10-CM | POA: Insufficient documentation

## 2019-08-15 DIAGNOSIS — I10 Essential (primary) hypertension: Secondary | ICD-10-CM | POA: Diagnosis not present

## 2019-08-15 DIAGNOSIS — R0789 Other chest pain: Secondary | ICD-10-CM | POA: Diagnosis not present

## 2019-08-15 DIAGNOSIS — E785 Hyperlipidemia, unspecified: Secondary | ICD-10-CM | POA: Diagnosis not present

## 2019-08-15 HISTORY — DX: Pure hyperglyceridemia: E78.1

## 2019-08-15 NOTE — Progress Notes (Signed)
Cardiology Office Note:    Date:  08/15/2019   ID:  Timothy [name], DOB 12/04/1949, MRN [id number]  PCP:  Libby Maw, MD  Cardiologist:  [name], MD    Referring MD: Libby Maw,*   Chief Complaint  Patient presents with  . Follow-up  Doing much better  History of Present Illness:    Timothy [name] is a 69 y.o. male with past medical history significant for essential hypertension, hypertriglyceridemia, atypical chest pain.  I have seen him for the first time about a month ago that he presented with atypical chest pain.  I did come to review additional concerns I want was a significant weight loss.  His pain was atypical being there all the time in the meantime since I seen him last time he was given proton pump inhibitor with complete resolution of his symptoms.  He said he does not have any chest pain right now.  He is back at the gym trying to exercise on a regular basis.  Noticed some fatigue and tiredness but overall seems to be doing well.  He is also started gaining weight and his appetite improved.  Overall he is doing better.  He did have abdominal ultrasound done he was found to have fatty liver.  Past Medical History:  Diagnosis Date  . Atypical chest pain 07/13/2019  . Cataract   . Chronic kidney disease    kidney stones  . COLONIC POLYPS, HX OF 02/12/2009  . Dyslipidemia 11/21/2007   Qualifier: Diagnosis of  By: Burnice Logan  MD, Doretha Sou   . Elevated LFTs 09/02/2018  . Essential hypertension 04/07/2016   Treatment initiated February 2018  . Follow-up examination after eye surgery 07/19/2019  . Healthcare maintenance 08/22/2018  . 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 HYPERLIPIDEMIA 11/21/2007  . Hypertension   . Left epiretinal membrane 07/19/2019  . Need for influenza vaccination 12/22/2017  . Need for shingles vaccine 09/02/2018  . 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 OSTEOARTHRITIS 01/17/2007  . Osteoarthritis  01/17/2007   Qualifier: Diagnosis of  By: Burnice Logan  MD, Doretha Sou   . Posterior vitreous detachment of left eye 07/19/2019  . Right epiretinal membrane 07/19/2019  . Subacromial impingement 01/16/2019    Past Surgical History:  Procedure Laterality Date  . APPENDECTOMY    . CARPAL TUNNEL RELEASE    . CATARACT EXTRACTION     june 2019  . HERNIA REPAIR     ingunial  . KNEE ARTHROSCOPY     bilateral  . SHOULDER SURGERY     left  . TOTAL KNEE ARTHROPLASTY     right    Current Medications: Current Meds  Medication Sig  . aspirin 81 MG tablet Take 162 mg by mouth daily.  Marland Kitchen atorvastatin (LIPITOR) 20 MG tablet Take 1 tablet (20 mg total) by mouth daily.  . fenofibrate (TRICOR) 145 MG tablet Take 1 tablet (145 mg total) by mouth daily.  Marland Kitchen lisinopril (ZESTRIL) 20 MG tablet Take 1 tablet (20 mg total) by mouth daily.  . Multiple Vitamin (MULTIVITAMIN) tablet Take 1 tablet by mouth daily.  . pantoprazole (PROTONIX) 40 MG tablet Take 1 tablet (40 mg total) by mouth daily.     Allergies:   Patient has no known allergies.   Social History   Socioeconomic History  . Marital status: Married    Spouse name: Not on file  . Number of children: Not on file  . Years of education: Not on file  .  Highest education level: Not on file  Occupational History  . Not on file  Tobacco Use  . Smoking status: Never Smoker  . Smokeless tobacco: Current User    Types: Chew  Substance and Sexual Activity  . Alcohol use: Yes    Alcohol/week: 6.0 - 8.0 standard drinks    Types: 6 - 8 Cans of beer per week  . Drug use: No  . Sexual activity: Not on file  Other Topics Concern  . Not on file  Social History Narrative  . Not on file   Social Determinants of Health   Financial Resource Strain:   . Difficulty of Paying Living Expenses:   Food Insecurity:   . Worried About Charity fundraiser in the Last Year:   . Arboriculturist in the Last Year:   Transportation Needs:   . Lexicographer (Medical):   Marland Kitchen Lack of Transportation (Non-Medical):   Physical Activity:   . Days of Exercise per Week:   . Minutes of Exercise per Session:   Stress:   . Feeling of Stress :   Social Connections:   . Frequency of Communication with Friends and Family:   . Frequency of Social Gatherings with Friends and Family:   . Attends Religious Services:   . Active Member of Clubs or Organizations:   . Attends Archivist Meetings:   Marland Kitchen Marital Status:      Family History: The patient's family history includes Cancer in his father; Colon cancer in his father. There is no history of Esophageal cancer, Rectal cancer, or Stomach cancer. ROS:   Please see the history of present illness.    All 14 point review of systems negative except as described per history of present illness  EKGs/Labs/Other Studies Reviewed:      Recent Labs: 07/06/2019: ALT 46; BUN 12; Creatinine, Ser 1.40; Hemoglobin 15.6; Platelets 352; Potassium 5.1; Sodium 128  Recent Lipid Panel    Component Value Date/Time   CHOL 384 (H) 07/11/2019 0829   TRIG (H) 07/11/2019 0829    947.0 Triglyceride is over 400; calculations on Lipids are invalid.   HDL 30.00 (L) 07/11/2019 0829   CHOLHDL 13 07/11/2019 0829   VLDL 18.0 07/14/2017 1112   LDLCALC 61 08/22/2018 1109   LDLDIRECT 75.0 07/11/2019 0829    Physical Exam:    VS:  BP (!) 148/74   Pulse 98   Ht 5\' 10"  (1.778 m)   Wt 154 lb (69.9 kg)   SpO2 99%   BMI 22.10 kg/m     Wt Readings from Last 3 Encounters:  08/15/19 154 lb (69.9 kg)  07/13/19 154 lb (69.9 kg)  07/05/19 150 lb 9.6 oz (68.3 kg)     GEN:  Well nourished, well developed in no acute distress HEENT: Normal NECK: No JVD; No carotid bruits LYMPHATICS: No lymphadenopathy CARDIAC: RRR, no murmurs, no rubs, no gallops RESPIRATORY:  Clear to auscultation without rales, wheezing or rhonchi  ABDOMEN: Soft, non-tender, non-distended MUSCULOSKELETAL:  No edema; No deformity  SKIN:  Warm and dry LOWER EXTREMITIES: no swelling NEUROLOGIC:  Alert and oriented x 3 PSYCHIATRIC:  Normal affect   ASSESSMENT:    1. Atypical chest pain   2. Dyslipidemia   3. Essential hypertension   4. Hypertriglyceridemia    PLAN:    In order of problems listed above:  1. Atypical chest pain he does have multiple risk factors for coronary artery disease.  I think it will  be still reasonable to evaluate him for possible ischemic heart disease.  I will schedule him to have [place]. 2. Dyslipidemia: He is on Lipitor 20 as well as TriCor.  He is scheduled next week to see his primary care physician who will do his fasting lipid profile.  He is triglycerides were 2 dangerous level.  But he was at risk of having pancreatitis, likely at that time his pancreatic enzymes were normal.  And overall right now he improved. 3. Essential hypertension blood pressure today 148/74.  I asked him to keep an eye on his blood pressure.  I will not modify his therapy at this stage.  He is going to the gym on a regular basis which should help with his blood pressure as well. 4. Hypertriglyceridemia: He is on TriCor which will continue again his fasting lipid profile will be checked next week by his primary care physician. 5. GERD, looks like he is responding very nicely to proton pump inhibitor.  However in my opinion he also need to see GI specialist.  I will make sure that he does not have any more oral concerning pathology.  We need to make sure he does not have Barrett's esophagus.   Medication Adjustments/Labs and Tests Ordered: Current medicines are reviewed at length with the patient today.  Concerns regarding medicines are outlined above.  No orders of the defined types were placed in this encounter.  Medication changes: No orders of the defined types were placed in this encounter.   Signed, Park Liter, MD, [facility] 08/15/2019 9:47 AM    [place]

## 2019-08-15 NOTE — Patient Instructions (Signed)
Medication Instructions:  Your physician recommends that you continue on your current medications as directed. Please refer to the Current Medication list given to you today.  *If you need a refill on your cardiac medications before your next appointment, please call your pharmacy*   Lab Work: None.  If you have labs (blood work) drawn today and your tests are completely normal, you will receive your results only by: Marland Kitchen MyChart Message (if you have MyChart) OR . A paper copy in the mail If you have any lab test that is abnormal or we need to change your treatment, we will call you to review the results.   Testing/Procedures:   [facility] Cardiovascular [facility] at [facility] [address] [place], [place] [address] Phone: [phone]    Please arrive 15 minutes prior to your appointment time for registration and insurance purposes.  The test will take approximately 3 to 4 hours to complete; you may bring reading material.  If someone comes with you to your appointment, they will need to remain in the main lobby due to limited space in the testing area. **If you are pregnant or breastfeeding, please notify the nuclear lab prior to your appointment**  How to prepare for your Myocardial Perfusion Test: . Do not eat or drink 3 hours prior to your test, except you may have water. . Do not consume products containing caffeine (regular or decaffeinated) 12 hours prior to your test. (ex: coffee, chocolate, sodas, tea). . Do bring a list of your current medications with you.  If not listed below, you may take your medications as normal. . Do wear comfortable clothes (no dresses or overalls) and walking shoes, tennis shoes preferred (No heels or open toe shoes are allowed). . Do NOT wear cologne, perfume, aftershave, or lotions (deodorant is allowed). . If these instructions are not followed, your test will have to be rescheduled.  Please report to [address]  [address] for your test.  If you have questions or concerns about your appointment, you can call the Nuclear Lab at [phone].  If you cannot keep your appointment, please provide 24 hours notification to the Nuclear Lab, to avoid a possible $50 charge to your account.    Follow-Up: At [facility], you and your health needs are our priority.  As part of our continuing mission to provide you with exceptional heart care, we have created designated Provider Care Teams.  These Care Teams include your primary Cardiologist (physician) and Advanced Practice Providers (APPs -  Physician Assistants and Nurse Practitioners) who all work together to provide you with the care you need, when you need it.  We recommend signing up for the patient portal called "MyChart".  Sign up information is provided on this After Visit Summary.  MyChart is used to connect with patients for Virtual Visits (Telemedicine).  Patients are able to view lab/test results, encounter notes, upcoming appointments, etc.  Non-urgent messages can be sent to your provider as well.   To learn more about what you can do with MyChart, go to [email or web address].    Your next appointment:   3 month(s)  The format for your next appointment:   In Person  Provider:   [name], MD   Other Instructions   Cardiac Nuclear Scan A cardiac nuclear scan is a test that measures blood flow to the heart when a person is resting and when he or she is exercising. The test looks for problems such as:  Not enough blood reaching a portion of the heart.  The heart muscle not working normally. You may need this test if:  You have heart disease.  You have had abnormal lab results.  You have had heart surgery or a balloon procedure to open up blocked arteries (angioplasty).  You have chest pain.  You have shortness of breath. In this test, a radioactive dye (tracer) is injected into your bloodstream. After the tracer has  traveled to your heart, an imaging device is used to measure how much of the tracer is absorbed by or distributed to various areas of your heart. This procedure is usually done at a hospital and takes 2-4 hours. Tell a health care provider about:  Any allergies you have.  All medicines you are taking, including vitamins, herbs, eye drops, creams, and over-the-counter medicines.  Any problems you or family members have had with anesthetic medicines.  Any blood disorders you have.  Any surgeries you have had.  Any medical conditions you have.  Whether you are pregnant or may be pregnant. What are the risks? Generally, this is a safe procedure. However, problems may occur, including:  Serious chest pain and heart attack. This is only a risk if the stress portion of the test is done.  Rapid heartbeat.  Sensation of warmth in your chest. This usually passes quickly.  Allergic reaction to the tracer. What happens before the procedure?  Ask your health care provider about changing or stopping your regular medicines. This is especially important if you are taking diabetes medicines or blood thinners.  Follow instructions from your health care provider about eating or drinking restrictions.  Remove your jewelry on the day of the procedure. What happens during the procedure?  An IV will be inserted into one of your veins.  Your health care provider will inject a small amount of radioactive tracer through the IV.  You will wait for 20-40 minutes while the tracer travels through your bloodstream.  Your heart activity will be monitored with an electrocardiogram (ECG).  You will lie down on an exam table.  Images of your heart will be taken for about 15-20 minutes.  You may also have a stress test. For this test, one of the following may be done: ? You will exercise on a treadmill or stationary bike. While you exercise, your heart's activity will be monitored with an ECG, and your  blood pressure will be checked. ? You will be given medicines that will increase blood flow to parts of your heart. This is done if you are unable to exercise.  When blood flow to your heart has peaked, a tracer will again be injected through the IV.  After 20-40 minutes, you will get back on the exam table and have more images taken of your heart.  Depending on the type of tracer used, scans may need to be repeated 3-4 hours later.  Your IV line will be removed when the procedure is over. The procedure may vary among health care providers and hospitals. What happens after the procedure?  Unless your health care provider tells you otherwise, you may return to your normal schedule, including diet, activities, and medicines.  Unless your health care provider tells you otherwise, you may increase your fluid intake. This will help to flush the contrast dye from your body. Drink enough fluid to keep your urine pale yellow.  Ask your health care provider, or the department that is doing the test: ? When will my results  be ready? ? How will I get my results? Summary  A cardiac nuclear scan measures the blood flow to the heart when a person is resting and when he or she is exercising.  Tell your health care provider if you are pregnant.  Before the procedure, ask your health care provider about changing or stopping your regular medicines. This is especially important if you are taking diabetes medicines or blood thinners.  After the procedure, unless your health care provider tells you otherwise, increase your fluid intake. This will help flush the contrast dye from your body.  After the procedure, unless your health care provider tells you otherwise, you may return to your normal schedule, including diet, activities, and medicines. This information is not intended to replace advice given to you by your health care provider. Make sure you discuss any questions you have with your health care  provider. Document Revised: 08/01/2017 Document Reviewed: 08/01/2017 Elsevier Patient Education  2020 Elsevier Inc.   

## 2019-08-17 ENCOUNTER — Encounter (HOSPITAL_COMMUNITY): Payer: Medicare Other

## 2019-08-22 ENCOUNTER — Telehealth (HOSPITAL_COMMUNITY): Payer: Self-pay

## 2019-08-22 ENCOUNTER — Encounter (HOSPITAL_COMMUNITY): Payer: Self-pay

## 2019-08-22 NOTE — Telephone Encounter (Signed)
Detailed Instructions left on the patient's answering machine. Asked to call back with any questions. S.Ticara Waner EMTP

## 2019-08-23 ENCOUNTER — Other Ambulatory Visit: Payer: Self-pay

## 2019-08-24 ENCOUNTER — Ambulatory Visit (INDEPENDENT_AMBULATORY_CARE_PROVIDER_SITE_OTHER): Payer: Medicare Other | Admitting: Family Medicine

## 2019-08-24 ENCOUNTER — Encounter: Payer: Self-pay | Admitting: Family Medicine

## 2019-08-24 VITALS — BP 126/72 | HR 94 | Temp 98.4°F | Ht 70.0 in | Wt 158.4 lb

## 2019-08-24 DIAGNOSIS — I10 Essential (primary) hypertension: Secondary | ICD-10-CM

## 2019-08-24 DIAGNOSIS — R7989 Other specified abnormal findings of blood chemistry: Secondary | ICD-10-CM

## 2019-08-24 DIAGNOSIS — H9193 Unspecified hearing loss, bilateral: Secondary | ICD-10-CM | POA: Insufficient documentation

## 2019-08-24 DIAGNOSIS — E785 Hyperlipidemia, unspecified: Secondary | ICD-10-CM

## 2019-08-24 DIAGNOSIS — K760 Fatty (change of) liver, not elsewhere classified: Secondary | ICD-10-CM

## 2019-08-24 HISTORY — DX: Fatty (change of) liver, not elsewhere classified: K76.0

## 2019-08-24 HISTORY — DX: Unspecified hearing loss, bilateral: H91.93

## 2019-08-24 LAB — BASIC METABOLIC PANEL
BUN: 11 mg/dL (ref 6–23)
CO2: 26 mEq/L (ref 19–32)
Calcium: 9.7 mg/dL (ref 8.4–10.5)
Chloride: 100 mEq/L (ref 96–112)
Creatinine, Ser: 1.09 mg/dL (ref 0.40–1.50)
GFR: 66.93 mL/min (ref >60.00)
Glucose, Bld: 97 mg/dL (ref 70–99)
Potassium: 4 mEq/L (ref 3.5–5.1)
Sodium: 137 mEq/L (ref 135–145)

## 2019-08-24 NOTE — Patient Instructions (Signed)
Managing Your Hypertension Hypertension is commonly called high blood pressure. This is when the force of your blood pressing against the walls of your arteries is too strong. Arteries are blood vessels that carry blood from your heart throughout your body. Hypertension forces the heart to work harder to pump blood, and may cause the arteries to become narrow or stiff. Having untreated or uncontrolled hypertension can cause heart attack, stroke, kidney disease, and other problems. What are blood pressure readings? A blood pressure reading consists of a higher number over a lower number. Ideally, your blood pressure should be below 120/80. The first ("top") number is called the systolic pressure. It is a measure of the pressure in your arteries as your heart beats. The second ("bottom") number is called the diastolic pressure. It is a measure of the pressure in your arteries as the heart relaxes. What does my blood pressure reading mean? Blood pressure is classified into four stages. Based on your blood pressure reading, your health care provider may use the following stages to determine what type of treatment you need, if any. Systolic pressure and diastolic pressure are measured in a unit called mm Hg. Normal  Systolic pressure: below 120.  Diastolic pressure: below 80. Elevated  Systolic pressure: 120-129.  Diastolic pressure: below 80. Hypertension stage 1  Systolic pressure: 130-139.  Diastolic pressure: 80-89. Hypertension stage 2  Systolic pressure: 140 or above.  Diastolic pressure: 90 or above. What health risks are associated with hypertension? Managing your hypertension is an important responsibility. Uncontrolled hypertension can lead to:  A heart attack.  A stroke.  A weakened blood vessel (aneurysm).  Heart failure.  Kidney damage.  Eye damage.  Metabolic syndrome.  Memory and concentration problems. What changes can I make to manage my  hypertension? Hypertension can be managed by making lifestyle changes and possibly by taking medicines. Your health care provider will help you make a plan to bring your blood pressure within a normal range. Eating and drinking   Eat a diet that is high in fiber and potassium, and low in salt (sodium), added sugar, and fat. An example eating plan is called the DASH (Dietary Approaches to Stop Hypertension) diet. To eat this way: ? Eat plenty of fresh fruits and vegetables. Try to fill half of your plate at each meal with fruits and vegetables. ? Eat whole grains, such as whole wheat pasta, brown rice, or whole grain bread. Fill about one quarter of your plate with whole grains. ? Eat low-fat diary products. ? Avoid fatty cuts of meat, processed or cured meats, and poultry with skin. Fill about one quarter of your plate with lean proteins such as fish, chicken without skin, beans, eggs, and tofu. ? Avoid premade and processed foods. These tend to be higher in sodium, added sugar, and fat.  Reduce your daily sodium intake. Most people with hypertension should eat less than 1,500 mg of sodium a day.  Limit alcohol intake to no more than 1 drink a day for nonpregnant women and 2 drinks a day for men. One drink equals 12 oz of beer, 5 oz of wine, or 1 oz of hard liquor. Lifestyle  Work with your health care provider to maintain a healthy body weight, or to lose weight. Ask what an ideal weight is for you.  Get at least 30 minutes of exercise that causes your heart to beat faster (aerobic exercise) most days of the week. Activities may include walking, swimming, or biking.  Include exercise   to strengthen your muscles (resistance exercise), such as weight lifting, as part of your weekly exercise routine. Try to do these types of exercises for 30 minutes at least 3 days a week.  Do not use any products that contain nicotine or tobacco, such as cigarettes and e-cigarettes. If you need help quitting,  ask your health care provider.  Control any long-term (chronic) conditions you have, such as high cholesterol or diabetes. Monitoring  Monitor your blood pressure at home as told by your health care provider. Your personal target blood pressure may vary depending on your medical conditions, your age, and other factors.  Have your blood pressure checked regularly, as often as told by your health care provider. Working with your health care provider  Review all the medicines you take with your health care provider because there may be side effects or interactions.  Talk with your health care provider about your diet, exercise habits, and other lifestyle factors that may be contributing to hypertension.  Visit your health care provider regularly. Your health care provider can help you create and adjust your plan for managing hypertension. Will I need medicine to control my blood pressure? Your health care provider may prescribe medicine if lifestyle changes are not enough to get your blood pressure under control, and if:  Your systolic blood pressure is 130 or higher.  Your diastolic blood pressure is 80 or higher. Take medicines only as told by your health care provider. Follow the directions carefully. Blood pressure medicines must be taken as prescribed. The medicine does not work as well when you skip doses. Skipping doses also puts you at risk for problems. Contact a health care provider if:  You think you are having a reaction to medicines you have taken.  You have repeated (recurrent) headaches.  You feel dizzy.  You have swelling in your ankles.  You have trouble with your vision. Get help right away if:  You develop a severe headache or confusion.  You have unusual weakness or numbness, or you feel faint.  You have severe pain in your chest or abdomen.  You vomit repeatedly.  You have trouble breathing. Summary  Hypertension is when the force of blood pumping  through your arteries is too strong. If this condition is not controlled, it may put you at risk for serious complications.  Your personal target blood pressure may vary depending on your medical conditions, your age, and other factors. For most people, a normal blood pressure is less than 120/80.  Hypertension is managed by lifestyle changes, medicines, or both. Lifestyle changes include weight loss, eating a healthy, low-sodium diet, exercising more, and limiting alcohol. This information is not intended to replace advice given to you by your health care provider. Make sure you discuss any questions you have with your health care provider. Document Revised: 06/09/2018 Document Reviewed: 01/14/2016 Elsevier Patient Education  2020 [place].  Nonalcoholic Fatty Liver Disease Diet, Adult Nonalcoholic fatty liver disease is a condition that causes fat to build up in and around the liver. The disease makes it harder for the liver to work the way that it should. Following a healthy diet can help to keep nonalcoholic fatty liver disease under control. It can also help to prevent or improve conditions that are associated with the disease, such as heart disease, diabetes, high blood pressure, and abnormal cholesterol levels. Along with regular exercise, this diet:  Promotes weight loss.  Helps to control blood sugar levels.  Helps to improve the  way that the body uses insulin. What are tips for following this plan? Reading food labels Always check food labels for:  The amount of saturated fat in a food. You should limit your intake of saturated fat. Saturated fat is found in foods that come from animals, including meat and dairy products such as butter, cheese, and whole milk.  The amount of fiber in a food. You should choose high-fiber foods such as fruits, vegetables, and whole grains. Try to get 25-30 grams (g) of fiber a day.  Cooking  When cooking, use heart-healthy oils that are high  in monounsaturated fats. These include olive oil, canola oil, and avocado oil.  Limit frying or deep-frying foods. Cook foods using healthy methods such as baking, boiling, steaming, and grilling instead. Meal planning  You may want to keep track of how many calories you take in. Eating the right amount of calories will help you achieve a healthy weight. Meeting with a registered dietitian can help you get started.  Limit how often you eat takeout and fast food. These foods are usually very high in fat, salt, and sugar.  Use the glycemic index (GI) to plan your meals. The index tells you how quickly a food will raise your blood sugar. Choose low-GI foods (GI less than 55). These foods take a longer time to raise blood sugar. A registered dietitian can help you identify foods lower on the GI scale. Lifestyle  You may want to follow a Mediterranean diet. This diet includes a lot of vegetables, lean meats or fish, whole grains, fruits, and healthy oils and fats. What foods can I eat?  Fruits Bananas. Apples. Oranges. Grapes. Papaya. Mango. Pomegranate. Kiwi. Grapefruit. Cherries. Vegetables Lettuce. Spinach. Peas. Beets. Cauliflower. Cabbage. Broccoli. Carrots. Tomatoes. Squash. Eggplant. Herbs. Peppers. Onions. Cucumbers. Brussels sprouts. Yams and sweet potatoes. Beans. Lentils. Grains Whole wheat or whole-grain foods, including breads, crackers, cereals, and pasta. Stone-ground whole wheat. Unsweetened oatmeal. Bulgur. Barley. Quinoa. Brown or wild rice. Corn or whole wheat flour tortillas. Meats and other proteins Lean meats. Poultry. Tofu. Seafood and shellfish. Dairy Low-fat or fat-free dairy products, such as yogurt, cottage cheese, or cheese. Beverages Water. Sugar-free drinks. Tea. Coffee. Low-fat or skim milk. Milk alternatives, such as soy or almond milk. Real fruit juice. Fats and oils Avocado. Canola or olive oil. Nuts and nut butters. Seeds. Seasonings and condiments Mustard.  Relish. Low-fat, low-sugar ketchup and barbecue sauce. Low-fat or fat-free mayonnaise. Sweets and desserts Sugar-free sweets. The items listed above may not be a complete list of foods and beverages you can eat. Contact a dietitian for more information. What foods should I limit or avoid? Meats and other proteins Limit red meat to 1-2 times a week. Dairy [employer]. Fats and oils Palm oil and coconut oil. Fried foods. Other foods Processed foods. Foods that contain a lot of salt or sodium. Sweets and desserts Sweets that contain sugar. Beverages Sweetened drinks, such as sweet tea, milkshakes, iced sweet drinks, and sodas. Alcohol. The items listed above may not be a complete list of foods and beverages you should avoid. Contact a dietitian for more information. Where to find more information The [employer] of Diabetes and Digestive and Kidney Diseases: [employer] Summary  Nonalcoholic fatty liver disease is a condition that causes fat to build up in and around the liver.  Following a healthy diet can help to keep nonalcoholic fatty liver disease under control. Your diet should be rich in fruits, vegetables, whole grains, and lean  proteins.  Limit your intake of saturated fat. Saturated fat is found in foods that come from animals, including meat and dairy products such as butter, cheese, and whole milk.  This diet promotes weight loss, helps to control blood sugar levels, and helps to improve the way that the body uses insulin. This information is not intended to replace advice given to you by your health care provider. Make sure you discuss any questions you have with your health care provider. Document Revised: 06/09/2018 Document Reviewed: 03/09/2018 Elsevier Patient Education  [place].

## 2019-08-24 NOTE — Progress Notes (Signed)
Established Patient Office Visit  Subjective:  Patient ID: Timothy [name], male    DOB: 12/04/1949  Age: 69 y.o. MRN: [id number]  CC:  Chief Complaint  Patient presents with  . Annual Exam    CPE, no concerns.    HPI Timothy [name] presents for a complete physical and follow-up of his hypertension, hypertriglyceridemia, elevated cholesterol and steatosis.  Patient averages 1 beer daily.  Taking lisinopril atorvastatin and fenofibrate without issue.  Has been working out at [employer].  Admits to some degree of decreasing urine flow and occasional nocturia.  Otherwise BPH symptoms are minimized.  Past Medical History:  Diagnosis Date  . Atypical chest pain 07/13/2019  . Cataract   . Chronic kidney disease    kidney stones  . COLONIC POLYPS, HX OF 02/12/2009  . Dyslipidemia 11/21/2007   Qualifier: Diagnosis of  By: Burnice Logan  MD, Doretha Sou   . Elevated LFTs 09/02/2018  . Essential hypertension 04/07/2016   Treatment initiated February 2018  . Follow-up examination after eye surgery 07/19/2019  . Healthcare maintenance 08/22/2018  . 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 HYPERLIPIDEMIA 11/21/2007  . Hypertension   . Left epiretinal membrane 07/19/2019  . Need for influenza vaccination 12/22/2017  . Need for shingles vaccine 09/02/2018  . 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 OSTEOARTHRITIS 01/17/2007  . Osteoarthritis 01/17/2007   Qualifier: Diagnosis of  By: Burnice Logan  MD, Doretha Sou   . Posterior vitreous detachment of left eye 07/19/2019  . Right epiretinal membrane 07/19/2019  . Subacromial impingement 01/16/2019    Past Surgical History:  Procedure Laterality Date  . APPENDECTOMY    . CARPAL TUNNEL RELEASE    . CATARACT EXTRACTION     june 2019  . HERNIA REPAIR     ingunial  . KNEE ARTHROSCOPY     bilateral  . SHOULDER SURGERY     left  . TOTAL KNEE ARTHROPLASTY     right    Family History  Problem Relation Age of Onset  . Cancer Father         colon ca  . Colon cancer Father   . Esophageal cancer Neg Hx   . Rectal cancer Neg Hx   . Stomach cancer Neg Hx     Social History   Socioeconomic History  . Marital status: Married    Spouse name: Not on file  . Number of children: Not on file  . Years of education: Not on file  . Highest education level: Not on file  Occupational History  . Not on file  Tobacco Use  . Smoking status: Never Smoker  . Smokeless tobacco: Current User    Types: Chew  Substance and Sexual Activity  . Alcohol use: Yes    Alcohol/week: 6.0 - 8.0 standard drinks    Types: 6 - 8 Cans of beer per week  . Drug use: No  . Sexual activity: Not on file  Other Topics Concern  . Not on file  Social History Narrative  . Not on file   Social Determinants of Health   Financial Resource Strain:   . Difficulty of Paying Living Expenses:   Food Insecurity:   . Worried About Charity fundraiser in the Last Year:   . Arboriculturist in the Last Year:   Transportation Needs:   . Film/video editor (Medical):   Marland Kitchen Lack of Transportation (Non-Medical):   Physical Activity:   . Days of Exercise  per Week:   . Minutes of Exercise per Session:   Stress:   . Feeling of Stress :   Social Connections:   . Frequency of Communication with Friends and Family:   . Frequency of Social Gatherings with Friends and Family:   . Attends Religious Services:   . Active Member of Clubs or Organizations:   . Attends Archivist Meetings:   Marland Kitchen Marital Status:   Intimate Partner Violence:   . Fear of Current or Ex-Partner:   . Emotionally Abused:   Marland Kitchen Physically Abused:   . Sexually Abused:     Outpatient Medications Prior to Visit  Medication Sig Dispense Refill  . aspirin 81 MG tablet Take 162 mg by mouth daily.    Marland Kitchen atorvastatin (LIPITOR) 20 MG tablet Take 1 tablet (20 mg total) by mouth daily. 90 tablet 0  . fenofibrate (TRICOR) 145 MG tablet Take 1 tablet (145 mg total) by mouth daily. 90 tablet 1  .  lisinopril (ZESTRIL) 20 MG tablet Take 1 tablet (20 mg total) by mouth daily. 90 tablet 0  . Multiple Vitamin (MULTIVITAMIN) tablet Take 1 tablet by mouth daily.    . pantoprazole (PROTONIX) 40 MG tablet Take 1 tablet (40 mg total) by mouth daily. 30 tablet 3  . betamethasone dipropionate 0.05 % cream Apply topically.     No facility-administered medications prior to visit.    No Known Allergies  ROS Review of Systems  Constitutional: Negative.   HENT: Positive for hearing loss.   Eyes: Negative for photophobia and visual disturbance.  Respiratory: Negative.   Cardiovascular: Negative.   Gastrointestinal: Negative.   Endocrine: Negative for polyphagia and polyuria.  Genitourinary: Positive for difficulty urinating. Negative for frequency and urgency.  Musculoskeletal: Negative for gait problem and joint swelling.  Allergic/Immunologic: Negative for immunocompromised state.  Neurological: Negative for light-headedness and headaches.  Hematological: Does not bruise/bleed easily.  Psychiatric/Behavioral: Negative.       Objective:    Physical Exam Vitals and nursing note reviewed.  Constitutional:      General: He is not in acute distress.    Appearance: Normal appearance. He is normal weight. He is not ill-appearing, toxic-appearing or diaphoretic.  HENT:     Head: Normocephalic and atraumatic.     Right Ear: Tympanic membrane, ear canal and external ear normal.     Left Ear: Tympanic membrane, ear canal and external ear normal.     Mouth/Throat:     Mouth: Mucous membranes are moist.     Pharynx: Oropharynx is clear. No oropharyngeal exudate or posterior oropharyngeal erythema.  Eyes:     General: No scleral icterus.       Right eye: No discharge.        Left eye: No discharge.     Extraocular Movements: Extraocular movements intact.     Conjunctiva/sclera: Conjunctivae normal.     Pupils: Pupils are equal, round, and reactive to light.  Cardiovascular:     Rate and  Rhythm: Normal rate and regular rhythm.  Pulmonary:     Effort: Pulmonary effort is normal.     Breath sounds: Normal breath sounds.  Abdominal:     General: Abdomen is flat. Bowel sounds are normal. There is no distension.     Palpations: Abdomen is soft. There is no mass.     Tenderness: There is no abdominal tenderness. There is no guarding or rebound.     Hernia: No hernia is present.  Genitourinary:  Prostate: Enlarged. No nodules present.     Rectum: Guaiac result negative. No mass, tenderness, anal fissure, external hemorrhoid or internal hemorrhoid. Normal anal tone.  Musculoskeletal:     Cervical back: No rigidity or tenderness.  Lymphadenopathy:     Cervical: No cervical adenopathy.  Skin:    General: Skin is warm and dry.  Neurological:     Mental Status: He is alert and oriented to person, place, and time.  Psychiatric:        Mood and Affect: Mood normal.        Behavior: Behavior normal.     BP 126/72   Pulse 94   Temp 98.4 F (36.9 C) (Tympanic)   Ht 5\' 10"  (1.778 m)   Wt 158 lb 6.4 oz (71.8 kg)   SpO2 96%   BMI 22.73 kg/m  Wt Readings from Last 3 Encounters:  08/24/19 158 lb 6.4 oz (71.8 kg)  08/15/19 154 lb (69.9 kg)  07/13/19 154 lb (69.9 kg)     Health Maintenance Due  Topic Date Due  . COVID-19 Vaccine (1) Never done    There are no preventive care reminders to display for this patient.  Lab Results  Component Value Date   TSH 0.81 07/14/2017   Lab Results  Component Value Date   WBC 5.3 07/06/2019   HGB 15.6 07/06/2019   HCT 41.0 07/06/2019   MCV 97.2 07/06/2019   PLT 352 07/06/2019   Lab Results  Component Value Date   NA 128 (L) 07/06/2019   K 5.1 07/06/2019   CO2 25 07/06/2019   GLUCOSE 107 (H) 07/06/2019   BUN 12 07/06/2019   CREATININE 1.40 07/06/2019   BILITOT 1.4 (H) 07/06/2019   ALKPHOS 119 (H) 07/06/2019   AST 100 (H) 07/06/2019   ALT 46 07/06/2019   PROT 7.0 07/06/2019   ALBUMIN 4.0 07/06/2019   CALCIUM 9.6  07/06/2019   GFR 50.31 (L) 07/06/2019   Lab Results  Component Value Date   CHOL 384 (H) 07/11/2019   Lab Results  Component Value Date   HDL 30.00 (L) 07/11/2019   Lab Results  Component Value Date   LDLCALC 61 08/22/2018   Lab Results  Component Value Date   TRIG (H) 07/11/2019    947.0 Triglyceride is over 400; calculations on Lipids are invalid.   Lab Results  Component Value Date   CHOLHDL 13 07/11/2019   No results found for: HGBA1C    Assessment & Plan:   Problem List Items Addressed This Visit      Cardiovascular and Mediastinum   Essential hypertension - Primary     Digestive   Steatosis of liver     Nervous and Auditory   Bilateral hearing loss     Other   Dyslipidemia   Elevated LFTs      No orders of the defined types were placed in this encounter.   Follow-up: Return in about 3 months (around 11/24/2019), or continue all meds.    Libby Maw, MD

## 2019-08-28 ENCOUNTER — Ambulatory Visit (HOSPITAL_COMMUNITY): Payer: Medicare Other | Attending: Cardiology

## 2019-08-28 ENCOUNTER — Other Ambulatory Visit: Payer: Self-pay

## 2019-08-28 DIAGNOSIS — R0789 Other chest pain: Secondary | ICD-10-CM | POA: Diagnosis present

## 2019-08-28 LAB — MYOCARDIAL PERFUSION IMAGING
LV dias vol: 53 mL (ref 62–150)
LV sys vol: 16 mL
Peak HR: 111 {beats}/min
Rest HR: 85 {beats}/min
SDS: 0
SRS: 0
SSS: 0
TID: 0.97

## 2019-08-28 MED ORDER — TECHNETIUM TC 99M TETROFOSMIN IV KIT
30.9000 | PACK | Freq: Once | INTRAVENOUS | Status: AC | PRN
  Administered 2019-08-28: 30.9 via INTRAVENOUS
  Filled 2019-08-28: qty 31

## 2019-08-28 MED ORDER — TECHNETIUM TC 99M TETROFOSMIN IV KIT
10.1000 | PACK | Freq: Once | INTRAVENOUS | Status: AC | PRN
  Administered 2019-08-28: 10.1 via INTRAVENOUS
  Filled 2019-08-28: qty 11

## 2019-08-28 MED ORDER — REGADENOSON 0.4 MG/5ML IV SOLN
0.4000 mg | Freq: Once | INTRAVENOUS | Status: AC
Start: 2019-08-28 — End: 2019-08-28
  Administered 2019-08-28: 0.4 mg via INTRAVENOUS

## 2019-09-25 ENCOUNTER — Other Ambulatory Visit: Payer: Self-pay

## 2019-09-25 ENCOUNTER — Encounter: Payer: Self-pay | Admitting: Family Medicine

## 2019-09-25 DIAGNOSIS — K219 Gastro-esophageal reflux disease without esophagitis: Secondary | ICD-10-CM

## 2019-09-25 MED ORDER — PANTOPRAZOLE SODIUM 40 MG PO TBEC: 40.0000 mg | DELAYED_RELEASE_TABLET | Freq: Every day | ORAL | 3 refills | Status: DC

## 2019-09-25 MED ORDER — ATORVASTATIN CALCIUM 20 MG PO TABS: 20.0000 mg | ORAL_TABLET | Freq: Every day | ORAL | 0 refills | Status: DC

## 2019-09-25 MED ORDER — LISINOPRIL 20 MG PO TABS: 20.0000 mg | ORAL_TABLET | Freq: Every day | ORAL | 0 refills | Status: DC

## 2019-10-10 ENCOUNTER — Other Ambulatory Visit: Payer: Self-pay | Admitting: Family Medicine

## 2019-11-02 ENCOUNTER — Ambulatory Visit (INDEPENDENT_AMBULATORY_CARE_PROVIDER_SITE_OTHER): Payer: Medicare Other | Admitting: Cardiology

## 2019-11-02 ENCOUNTER — Encounter: Payer: Self-pay | Admitting: Cardiology

## 2019-11-02 ENCOUNTER — Other Ambulatory Visit: Payer: Self-pay

## 2019-11-02 VITALS — BP 132/80 | HR 90 | Ht 70.0 in | Wt 155.0 lb

## 2019-11-02 DIAGNOSIS — E781 Pure hyperglyceridemia: Secondary | ICD-10-CM

## 2019-11-02 DIAGNOSIS — R0789 Other chest pain: Secondary | ICD-10-CM | POA: Diagnosis not present

## 2019-11-02 DIAGNOSIS — I10 Essential (primary) hypertension: Secondary | ICD-10-CM

## 2019-11-02 NOTE — Progress Notes (Signed)
Cardiology Office Note:    Date:  11/02/2019   ID:  Timothy [name], DOB 12/04/1949, MRN [id number]  PCP:  Libby Maw, MD  Cardiologist:  [name], MD    Referring MD: Libby Maw,*   Chief Complaint  Patient presents with  . Follow-up  I am doing well  History of Present Illness:    Timothy [name] is a 69 y.o. male he was referred to [place] because of atypical chest pain.  Stress test was done which showed no evidence of ischemia he was put on proton pump inhibitor with complete resolution of his symptoms.  Additional issue is hypertriglyceridemia.  He is being treated with TriCor as well as Lipitor for hyperlipidemia and doing very well from that point review.  He also started going to the gym on the regular basis he goes to 3-4 times a week have no difficulty exercising gradually building up stamina.  Past Medical History:  Diagnosis Date  . Atypical chest pain 07/13/2019  . Cataract   . Chronic kidney disease    kidney stones  . COLONIC POLYPS, HX OF 02/12/2009  . Dyslipidemia 11/21/2007   Qualifier: Diagnosis of  By: Burnice Logan  MD, Doretha Sou   . Elevated LFTs 09/02/2018  . Essential hypertension 04/07/2016   Treatment initiated February 2018  . Follow-up examination after eye surgery 07/19/2019  . Healthcare maintenance 08/22/2018  . 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 HYPERLIPIDEMIA 11/21/2007  . Hypertension   . Left epiretinal membrane 07/19/2019  . Need for influenza vaccination 12/22/2017  . Need for shingles vaccine 09/02/2018  . 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 OSTEOARTHRITIS 01/17/2007  . Osteoarthritis 01/17/2007   Qualifier: Diagnosis of  By: Burnice Logan  MD, Doretha Sou   . Posterior vitreous detachment of left eye 07/19/2019  . Right epiretinal membrane 07/19/2019  . Subacromial impingement 01/16/2019    Past Surgical History:  Procedure Laterality Date  . APPENDECTOMY    . CARPAL TUNNEL RELEASE    . CATARACT  EXTRACTION     june 2019  . HERNIA REPAIR     ingunial  . KNEE ARTHROSCOPY     bilateral  . SHOULDER SURGERY     left  . TOTAL KNEE ARTHROPLASTY     right    Current Medications: Current Meds  Medication Sig  . aspirin 81 MG tablet Take 162 mg by mouth daily.  Marland Kitchen atorvastatin (LIPITOR) 20 MG tablet TAKE 1 TABLET BY MOUTH EVERY DAY  . betamethasone dipropionate 0.05 % cream Apply topically.  . fenofibrate (TRICOR) 145 MG tablet Take 1 tablet (145 mg total) by mouth daily.  Marland Kitchen lisinopril (ZESTRIL) 20 MG tablet TAKE 1 TABLET BY MOUTH EVERY DAY  . Multiple Vitamin (MULTIVITAMIN) tablet Take 1 tablet by mouth daily.  . pantoprazole (PROTONIX) 40 MG tablet Take 1 tablet (40 mg total) by mouth daily.  Marland Kitchen triamcinolone cream (KENALOG) 0.1 % Apply topically.     Allergies:   Patient has no known allergies.   Social History   Socioeconomic History  . Marital status: Married    Spouse name: Not on file  . Number of children: Not on file  . Years of education: Not on file  . Highest education level: Not on file  Occupational History  . Not on file  Tobacco Use  . Smoking status: Never Smoker  . Smokeless tobacco: Current User    Types: Chew  Substance and Sexual Activity  . Alcohol use: Yes  Alcohol/week: 6.0 - 8.0 standard drinks    Types: 6 - 8 Cans of beer per week  . Drug use: No  . Sexual activity: Not on file  Other Topics Concern  . Not on file  Social History Narrative  . Not on file   Social Determinants of Health   Financial Resource Strain:   . Difficulty of Paying Living Expenses: Not on file  Food Insecurity:   . Worried About Charity fundraiser in the Last Year: Not on file  . Ran Out of Food in the Last Year: Not on file  Transportation Needs:   . Lack of Transportation (Medical): Not on file  . Lack of Transportation (Non-Medical): Not on file  Physical Activity:   . Days of Exercise per Week: Not on file  . Minutes of Exercise per Session: Not on  file  Stress:   . Feeling of Stress : Not on file  Social Connections:   . Frequency of Communication with Friends and Family: Not on file  . Frequency of Social Gatherings with Friends and Family: Not on file  . Attends Religious Services: Not on file  . Active Member of Clubs or Organizations: Not on file  . Attends Archivist Meetings: Not on file  . Marital Status: Not on file     Family History: The patient's family history includes Cancer in his father; Colon cancer in his father. There is no history of Esophageal cancer, Rectal cancer, or Stomach cancer. ROS:   Please see the history of present illness.    All 14 point review of systems negative except as described per history of present illness  EKGs/Labs/Other Studies Reviewed:      Recent Labs: 07/06/2019: ALT 46; Hemoglobin 15.6; Platelets 352 08/24/2019: BUN 11; Creatinine, Ser 1.09; Potassium 4.0; Sodium 137  Recent Lipid Panel    Component Value Date/Time   CHOL 384 (H) 07/11/2019 0829   TRIG (H) 07/11/2019 0829    947.0 Triglyceride is over 400; calculations on Lipids are invalid.   HDL 30.00 (L) 07/11/2019 0829   CHOLHDL 13 07/11/2019 0829   VLDL 18.0 07/14/2017 1112   LDLCALC 61 08/22/2018 1109   LDLDIRECT 75.0 07/11/2019 0829    Physical Exam:    VS:  BP 132/80 (BP Location: Right Arm, Patient Position: Sitting, Cuff Size: Normal)   Pulse 90   Ht 5\' 10"  (1.778 m)   Wt 155 lb (70.3 kg)   SpO2 99%   BMI 22.24 kg/m     Wt Readings from Last 3 Encounters:  11/02/19 155 lb (70.3 kg)  08/28/19 154 lb (69.9 kg)  08/24/19 158 lb 6.4 oz (71.8 kg)     GEN:  Well nourished, well developed in no acute distress HEENT: Normal NECK: No JVD; No carotid bruits LYMPHATICS: No lymphadenopathy CARDIAC: RRR, no murmurs, no rubs, no gallops RESPIRATORY:  Clear to auscultation without rales, wheezing or rhonchi  ABDOMEN: Soft, non-tender, non-distended MUSCULOSKELETAL:  No edema; No deformity  SKIN:  Warm and dry LOWER EXTREMITIES: no swelling NEUROLOGIC:  Alert and oriented x 3 PSYCHIATRIC:  Normal affect   ASSESSMENT:    1. Atypical chest pain   2. Hypertriglyceridemia   3. Essential hypertension    PLAN:    In order of problems listed above:  1. Atypical chest pain, stress test negative.  The key is risk factors modifications which we are doing. 2. Hypertriglyceridemia he is taking TriCor, Lipitor as well, exercise on the regular basis  and watch diet very carefully.  He is scheduled to see his primary care physician in September to have his fasting lipid profile done.  I did review his K PN which showed me his LDL 75 HDL of 38 this is from Jul 11, 2019.  His triglycerides were however at that time 4102.  Obviously this very concerning.  If triglycerides will be still high he can benefit from being referred to lipid clinic. 3. Essential hypertension blood pressure well controlled we will continue present management.   Medication Adjustments/Labs and Tests Ordered: Current medicines are reviewed at length with the patient today.  Concerns regarding medicines are outlined above.  No orders of the defined types were placed in this encounter.  Medication changes: No orders of the defined types were placed in this encounter.   Signed, Park Liter, MD, [facility] 11/02/2019 8:47 AM    [place]

## 2019-11-02 NOTE — Patient Instructions (Signed)

## 2019-11-13 ENCOUNTER — Telehealth: Payer: Self-pay | Admitting: Family Medicine

## 2019-11-13 NOTE — Progress Notes (Signed)
  Chronic Care Management   Outreach Note  11/13/2019 Name: Timothy [name] MRN: [id number] DOB: 12/04/1949  Referred by: Libby Maw, MD Reason for referral : No chief complaint on file.   An unsuccessful telephone outreach was attempted today. The patient was referred to the pharmacist for assistance with care management and care coordination.   Follow Up Plan:   Carley Perdue [employer] Scheduler

## 2019-11-19 ENCOUNTER — Encounter (INDEPENDENT_AMBULATORY_CARE_PROVIDER_SITE_OTHER): Payer: Self-pay | Admitting: Ophthalmology

## 2019-11-19 ENCOUNTER — Ambulatory Visit (INDEPENDENT_AMBULATORY_CARE_PROVIDER_SITE_OTHER): Payer: Medicare Other | Admitting: Ophthalmology

## 2019-11-19 ENCOUNTER — Other Ambulatory Visit: Payer: Self-pay

## 2019-11-19 DIAGNOSIS — H35372 Puckering of macula, left eye: Secondary | ICD-10-CM | POA: Diagnosis not present

## 2019-11-19 DIAGNOSIS — H35371 Puckering of macula, right eye: Secondary | ICD-10-CM | POA: Diagnosis not present

## 2019-11-19 NOTE — Assessment & Plan Note (Signed)
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 I explained that in rare cases another surgery is needed to actually remove a second wrinkle should it regrow.  Most often, the epiretinal membrane and underlying wrinkled internal limiting membrane are removed with the first surgery, to accomplish the goals.   If the operative eye is Phak[place] (natural lens still present), cataract surgery is often recommended prior to Vitrectomy. This will enable the retina surgeon to have the best view during surgery and the patient to obtain optimal results in the future. Treatment options were discussed.  Patient symptoms, tolerable at this time thus we will continue to observe.

## 2019-11-19 NOTE — Patient Instructions (Signed)
To follow-up with Dr. Vevelyn Royals  Patient to notify this office if new onset visual acuity distortion or progression of vision loss  With distortion and clouding of vision occurs

## 2019-11-19 NOTE — Progress Notes (Signed)
11/19/2019     CHIEF COMPLAINT Patient presents for Retina Follow Up   HISTORY OF PRESENT ILLNESS: Timothy [name] is a 69 y.o. 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  Severity is moderate.  Duration of 4 months.  Since onset it is stable.  I, the attending physician,  performed the HPI with the patient and updated documentation appropriately.          Comments    4 Month f\u OU. FP and OCT  Pt states OS vision has decreased. Pt states OD vision is doing great.       Last edited by Tilda Franco on 11/19/2019  8:38 AM. (History)      Referring physician: Libby Maw, MD [place],  [place] [id number]  HISTORICAL INFORMATION:   Selected notes from the MEDICAL RECORD NUMBER       CURRENT MEDICATIONS: No current outpatient medications on file. (Ophthalmic Drugs)   No current facility-administered medications for this visit. (Ophthalmic Drugs)   Current Outpatient Medications (Other)  Medication Sig  . aspirin 81 MG tablet Take 162 mg by mouth daily.  Marland Kitchen atorvastatin (LIPITOR) 20 MG tablet TAKE 1 TABLET BY MOUTH EVERY DAY  . betamethasone dipropionate 0.05 % cream Apply topically.  . fenofibrate (TRICOR) 145 MG tablet Take 1 tablet (145 mg total) by mouth daily.  Marland Kitchen lisinopril (ZESTRIL) 20 MG tablet TAKE 1 TABLET BY MOUTH EVERY DAY  . Multiple Vitamin (MULTIVITAMIN) tablet Take 1 tablet by mouth daily.  . pantoprazole (PROTONIX) 40 MG tablet Take 1 tablet (40 mg total) by mouth daily.  Marland Kitchen triamcinolone cream (KENALOG) 0.1 % Apply topically.   No current facility-administered medications for this visit. (Other)      REVIEW OF SYSTEMS:    ALLERGIES No Known Allergies  PAST MEDICAL HISTORY Past Medical History:  Diagnosis Date  . Atypical chest pain 07/13/2019  . Cataract   . Chronic kidney disease    kidney stones  . COLONIC POLYPS, HX OF 02/12/2009  .  Dyslipidemia 11/21/2007   Qualifier: Diagnosis of  By: Burnice Logan  MD, Doretha Sou   . Elevated LFTs 09/02/2018  . Essential hypertension 04/07/2016   Treatment initiated February 2018  . Follow-up examination after eye surgery 07/19/2019  . Healthcare maintenance 08/22/2018  . 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 HYPERLIPIDEMIA 11/21/2007  . Hypertension   . Left epiretinal membrane 07/19/2019  . Need for influenza vaccination 12/22/2017  . Need for shingles vaccine 09/02/2018  . 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 OSTEOARTHRITIS 01/17/2007  . Osteoarthritis 01/17/2007   Qualifier: Diagnosis of  By: Burnice Logan  MD, Doretha Sou   . Posterior vitreous detachment of left eye 07/19/2019  . Right epiretinal membrane 07/19/2019  . Subacromial impingement 01/16/2019   Past Surgical History:  Procedure Laterality Date  . APPENDECTOMY    . CARPAL TUNNEL RELEASE    . CATARACT EXTRACTION     june 2019  . HERNIA REPAIR     ingunial  . KNEE ARTHROSCOPY     bilateral  . SHOULDER SURGERY     left  . TOTAL KNEE ARTHROPLASTY     right    FAMILY HISTORY Family History  Problem Relation Age of Onset  . Cancer Father        colon ca  . Colon cancer Father   .  Esophageal cancer Neg Hx   . Rectal cancer Neg Hx   . Stomach cancer Neg Hx     SOCIAL HISTORY Social History   Tobacco Use  . Smoking status: Never Smoker  . Smokeless tobacco: Current User    Types: Chew  Substance Use Topics  . Alcohol use: Yes    Alcohol/week: 6.0 - 8.0 standard drinks    Types: 6 - 8 Cans of beer per week  . Drug use: No         OPHTHALMIC EXAM:  Base Eye Exam    Visual Acuity (Snellen - Linear)      Right Left   Dist [place] 20/20 20/25       Tonometry (Tonopen, 8:38 AM)      Right Left   Pressure 15 13       Pupils      Pupils Dark Light Shape React APD   Right PERRL 3 3 Round Minimal None   Left PERRL 3 3 Round Minimal None       Visual Fields (Counting fingers)      Left Right     Full Full       Neuro/Psych    Oriented x3: Yes   Mood/Affect: Normal       Dilation    Both eyes: 1.0% Mydriacyl, 2.5% Phenylephrine @ 8:38 AM        Slit Lamp and Fundus Exam    External Exam      Right Left   External Normal Normal       Slit Lamp Exam      Right Left   Lids/Lashes Normal Normal   Conjunctiva/Sclera White and quiet White and quiet   Cornea Clear Clear   Anterior Chamber Deep and quiet Deep and quiet   Iris Round and reactive Round and reactive   Lens Posterior chamber intraocular lens Posterior chamber intraocular lens   Anterior Vitreous Normal Normal       Fundus Exam      Right Left   Posterior Vitreous Vitrectomized Posterior vitreous detachment   Disc Normal Normal   C/D Ratio 0.0    Macula No topo distortion Epiretinal membrane   Vessels Normal Normal   Periphery Normal Normal          IMAGING AND PROCEDURES  Imaging and Procedures for 11/19/19  Color Fundus Photography Optos - OU - Both Eyes       Right Eye Progression has improved. Disc findings include normal observations. Macula : drusen.   Left Eye Progression has been stable. Macula : drusen, epiretinal membrane.   Notes Epiretinal membrane with topographic distortion left eye, overall stable       OCT, Retina - OU - Both Eyes       Right Eye Quality was good. Scan locations included subfoveal. Central Foveal Thickness: 331. Progression has improved.   Left Eye Quality was good. Scan locations included subfoveal. Central Foveal Thickness: 394. Progression has been stable. Findings include epiretinal membrane, retinal drusen .   Notes OD vastly improved compared to preoperative May 08, 2019.  OS with minor retinal thickening from epiretinal membrane., retinal drusen                ASSESSMENT/PLAN:  Left epiretinal membrane The nature of macular pucker (epiretinal membrane ERM) was discussed with the patient as well as threshold criteria for  vitrectomy surgery. I explained that in rare cases another surgery is needed to actually remove a second wrinkle  should it regrow.  Most often, the epiretinal membrane and underlying wrinkled internal limiting membrane are removed with the first surgery, to accomplish the goals.   If the operative eye is [place] (natural lens still present), cataract surgery is often recommended prior to Vitrectomy. This will enable the retina surgeon to have the best view during surgery and the patient to obtain optimal results in the future. Treatment options were discussed.  Patient symptoms, tolerable at this time thus we will continue to observe.      ICD-10-CM   1. Right epiretinal membrane  H35.371 Color Fundus Photography Optos - OU - Both Eyes    OCT, Retina - OU - Both Eyes  2. Left epiretinal membrane  H35.372 Color Fundus Photography Optos - OU - Both Eyes    OCT, Retina - OU - Both Eyes    1.  2.  3.  Ophthalmic Meds Ordered this visit:  No orders of the defined types were placed in this encounter.      Return in about 1 year (around 11/18/2020) for DILATE OU, OCT.  Patient Instructions  To follow-up with Dr. Vevelyn Royals  Patient to notify this office if new onset visual acuity distortion or progression of vision loss  With distortion and clouding of vision occurs    Explained the diagnoses, plan, and follow up with the patient and they expressed understanding.  Patient expressed understanding of the importance of proper follow up care.   Clent Demark Zamere Pasternak M.D. Diseases & Surgery of the Retina and Vitreous [facility] [place] 11/19/19     Abbreviations: M myopia (nearsighted); A astigmatism; H hyperopia (farsighted); P presbyopia; Mrx spectacle prescription;  CTL contact lenses; OD right eye; OS left eye; OU both eyes  XT exotropia; ET esotropia; PEK punctate epithelial keratitis; PEE punctate epithelial erosions; DES dry eye syndrome; MGD meibomian gland dysfunction;  ATs artificial tears; PFAT's preservative free artificial tears; [place] nuclear sclerotic cataract; PSC posterior subcapsular cataract; ERM epi-retinal membrane; PVD posterior vitreous detachment; RD retinal detachment; DM diabetes mellitus; DR diabetic retinopathy; NPDR non-proliferative diabetic retinopathy; PDR proliferative diabetic retinopathy; CSME clinically significant macular edema; DME diabetic macular edema; dbh dot blot hemorrhages; CWS cotton wool spot; POAG primary open angle glaucoma; C/D cup-to-disc ratio; HVF humphrey visual field; GVF goldmann visual field; OCT optical coherence tomography; IOP intraocular pressure; BRVO Branch retinal vein occlusion; CRVO central retinal vein occlusion; CRAO central retinal artery occlusion; BRAO branch retinal artery occlusion; RT retinal tear; SB scleral buckle; PPV pars plana vitrectomy; VH Vitreous hemorrhage; PRP panretinal laser photocoagulation; IVK intravitreal kenalog; VMT vitreomacular traction; MH Macular hole;  NVD neovascularization of the disc; NVE neovascularization elsewhere; AREDS age related eye disease study; ARMD age related macular degeneration; POAG primary open angle glaucoma; EBMD epithelial/anterior basement membrane dystrophy; ACIOL anterior chamber intraocular lens; IOL intraocular lens; PCIOL posterior chamber intraocular lens; Phaco/IOL phacoemulsification with intraocular lens placement; [place] photorefractive keratectomy; LASIK laser assisted in situ keratomileusis; HTN hypertension; DM diabetes mellitus; COPD chronic obstructive pulmonary disease

## 2019-11-27 ENCOUNTER — Other Ambulatory Visit: Payer: Self-pay

## 2019-11-28 ENCOUNTER — Ambulatory Visit (INDEPENDENT_AMBULATORY_CARE_PROVIDER_SITE_OTHER): Payer: Medicare Other | Admitting: Family Medicine

## 2019-11-28 ENCOUNTER — Encounter: Payer: Self-pay | Admitting: Family Medicine

## 2019-11-28 VITALS — BP 138/70 | HR 70 | Temp 97.8°F | Ht 70.0 in | Wt 155.2 lb

## 2019-11-28 DIAGNOSIS — Z23 Encounter for immunization: Secondary | ICD-10-CM

## 2019-11-28 DIAGNOSIS — M25562 Pain in left knee: Secondary | ICD-10-CM

## 2019-11-28 DIAGNOSIS — I10 Essential (primary) hypertension: Secondary | ICD-10-CM

## 2019-11-28 DIAGNOSIS — M25561 Pain in right knee: Secondary | ICD-10-CM | POA: Insufficient documentation

## 2019-11-28 DIAGNOSIS — G8929 Other chronic pain: Secondary | ICD-10-CM

## 2019-11-28 DIAGNOSIS — E785 Hyperlipidemia, unspecified: Secondary | ICD-10-CM

## 2019-11-28 HISTORY — DX: Other chronic pain: G89.29

## 2019-11-28 LAB — BASIC METABOLIC PANEL
BUN: 14 mg/dL (ref 6–23)
CO2: 27 mEq/L (ref 19–32)
Calcium: 9.8 mg/dL (ref 8.4–10.5)
Chloride: 99 mEq/L (ref 96–112)
Creatinine, Ser: 1.19 mg/dL (ref 0.40–1.50)
GFR: 60.44 mL/min (ref >60.00)
Glucose, Bld: 95 mg/dL (ref 70–99)
Potassium: 4.4 mEq/L (ref 3.5–5.1)
Sodium: 135 mEq/L (ref 135–145)

## 2019-11-28 MED ORDER — MELOXICAM 7.5 MG PO TABS: ORAL_TABLET | ORAL | 1 refills | Status: DC

## 2019-11-28 NOTE — Patient Instructions (Signed)
Meloxicam tablets What is this medicine? MELOXICAM (mel OX i cam) is a non-steroidal anti-inflammatory drug (NSAID). It is used to reduce swelling and to treat pain. It may be used for osteoarthritis, rheumatoid arthritis, or juvenile rheumatoid arthritis. This medicine may be used for other purposes; ask your health care provider or pharmacist if you have questions. COMMON BRAND NAME(S): Mobic What should I tell my health care provider before I take this medicine? They need to know if you have any of these conditions:  bleeding disorders  cigarette smoker  coronary artery bypass graft (CABG) surgery within the past 2 weeks  drink more than 3 alcohol-containing drinks per day  heart disease  high blood pressure  history of stomach bleeding  kidney disease  liver disease  lung or breathing disease, like asthma  stomach or intestine problems  an unusual or allergic reaction to meloxicam, aspirin, other NSAIDs, other medicines, foods, dyes, or preservatives  pregnant or trying to get pregnant  breast-feeding How should I use this medicine? Take this medicine by mouth with a full glass of water. Follow the directions on the prescription label. You can take it with or without food. If it upsets your stomach, take it with food. Take your medicine at regular intervals. Do not take it more often than directed. Do not stop taking except on your doctor's advice. A special MedGuide will be given to you by the pharmacist with each prescription and refill. Be sure to read this information carefully each time. Talk to your pediatrician regarding the use of this medicine in children. While this drug may be prescribed for selected conditions, precautions do apply. Patients over 16 years old may have a stronger reaction and need a smaller dose. Overdosage: If you think you have taken too much of this medicine contact a poison control center or emergency room at once. NOTE: This medicine is  only for you. Do not share this medicine with others. What if I miss a dose? If you miss a dose, take it as soon as you can. If it is almost time for your next dose, take only that dose. Do not take double or extra doses. What may interact with this medicine? Do not take this medicine with any of the following medications:  cidofovir  ketorolac This medicine may also interact with the following medications:  aspirin and aspirin-like medicines  certain medicines for blood pressure, heart disease, irregular heart beat  certain medicines for depression, anxiety, or psychotic disturbances  certain medicines that treat or prevent blood clots like warfarin, enoxaparin, dalteparin, apixaban, dabigatran, rivaroxaban  cyclosporine  diuretics  fluconazole  lithium  methotrexate  other NSAIDs, medicines for pain and inflammation, like ibuprofen and naproxen  pemetrexed This list may not describe all possible interactions. Give your health care provider a list of all the medicines, herbs, non-prescription drugs, or dietary supplements you use. Also tell them if you smoke, drink alcohol, or use illegal drugs. Some items may interact with your medicine. What should I watch for while using this medicine? Tell your doctor or healthcare provider if your symptoms do not start to get better or if they get worse. This medicine may cause serious skin reactions. They can happen weeks to months after starting the medicine. Contact your healthcare provider right away if you notice fevers or flu-like symptoms with a rash. The rash may be red or purple and then turn into blisters or peeling of the skin. Or, you might notice a red rash with  swelling of the face, lips or lymph nodes in your neck or under your arms. Do not take other medicines that contain aspirin, ibuprofen, or naproxen with this medicine. Side effects such as stomach upset, nausea, or ulcers may be more likely to occur. Many medicines  available without a prescription should not be taken with this medicine. This medicine can cause ulcers and bleeding in the stomach and intestines at any time during treatment. This can happen with no warning and may cause death. There is increased risk with taking this medicine for a long time. Smoking, drinking alcohol, older age, and poor health can also increase risks. Call your doctor right away if you have stomach pain or blood in your vomit or stool. This medicine does not prevent heart attack or stroke. In fact, this medicine may increase the chance of a heart attack or stroke. The chance may increase with longer use of this medicine and in people who have heart disease. If you take aspirin to prevent heart attack or stroke, talk with your doctor or healthcare provider. What side effects may I notice from receiving this medicine? Side effects that you should report to your doctor or health care professional as soon as possible:  allergic reactions like skin rash, itching or hives, swelling of the face, lips, or tongue  nausea, vomiting  redness, blistering, peeling, or loosening of the skin, including inside the mouth  signs and symptoms of a blood clot such as breathing problems; changes in vision; chest pain; severe, sudden headache; pain, swelling, warmth in the leg; trouble speaking; sudden numbness or weakness of the face, arm, or leg  signs and symptoms of bleeding such as bloody or black, tarry stools; red or dark-brown urine; spitting up blood or brown material that looks like coffee grounds; red spots on the skin; unusual bruising or bleeding from the eye, gums, or nose  signs and symptoms of liver injury like dark yellow or brown urine; general ill feeling or flu-like symptoms; light-colored stools; loss of appetite; nausea; right upper belly pain; unusually weak or tired; yellowing of the eyes or skin  signs and symptoms of stroke like changes in vision; confusion; trouble  speaking or understanding; severe headaches; sudden numbness or weakness of the face, arm, or leg; trouble walking; dizziness; loss of balance or coordination Side effects that usually do not require medical attention (report to your doctor or health care professional if they continue or are bothersome):  constipation  diarrhea  gas This list may not describe all possible side effects. Call your doctor for medical advice about side effects. You may report side effects to FDA at 1-800-FDA-1088. Where should I keep my medicine? Keep out of the reach of children. Store at room temperature between 15 and 30 degrees C (59 and 86 degrees F). Throw away any unused medicine after the expiration date. NOTE: This sheet is a summary. It may not cover all possible information. If you have questions about this medicine, talk to your doctor, pharmacist, or health care provider.  2020 [employer]/Gold Standard (2018-05-17 11:21:28)

## 2019-11-28 NOTE — Progress Notes (Signed)
Established Patient Office Visit  Subjective:  Patient ID: Timothy [name], male    DOB: 12/04/1949  Age: 69 y.o. MRN: [id number]  CC:  Chief Complaint  Patient presents with  . Follow-up    3 month follow upon BP, no concerns.    HPI Kennieth Plotts presents for follow-up of hypertension, elevated triglycerides and chronic pain of his left knee.  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  Probably needs the left knee replaced but is reluctant to do so.  It bothers him from time to time.  He is an active individual.  He has tried his wife's meloxicam and has helped he takes 1 every so often and rarely.  It works well for him.  Blood pressures been well controlled with lisinopril.  His GFR has fallen over the years.  He has been taking his TriCor and Lipitor for his elevated triglycerides.  They have been elevated his entire life.  He does not smoke.  He does eat a low-fat low cholesterol diet.  Past Medical History:  Diagnosis Date  . Atypical chest pain 07/13/2019  . Cataract   . Chronic kidney disease    kidney stones  . COLONIC POLYPS, HX OF 02/12/2009  . Dyslipidemia 11/21/2007   Qualifier: Diagnosis of  By: Burnice Logan  MD, Doretha Sou   . Elevated LFTs 09/02/2018  . Essential hypertension 04/07/2016   Treatment initiated February 2018  . Follow-up examination after eye surgery 07/19/2019  . Healthcare maintenance 08/22/2018  . 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 HYPERLIPIDEMIA 11/21/2007  . Hypertension   . Left epiretinal membrane 07/19/2019  . Need for influenza vaccination 12/22/2017  . Need for shingles vaccine 09/02/2018  . 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 OSTEOARTHRITIS 01/17/2007  . Osteoarthritis 01/17/2007   Qualifier: Diagnosis of  By: Burnice Logan  MD, Doretha Sou   . Posterior vitreous detachment of left eye 07/19/2019  . Right epiretinal membrane 07/19/2019  . Subacromial impingement 01/16/2019      Past Surgical History:  Procedure Laterality Date  . APPENDECTOMY    . CARPAL TUNNEL RELEASE    . CATARACT EXTRACTION     june 2019  . HERNIA REPAIR     ingunial  . KNEE ARTHROSCOPY     bilateral  . SHOULDER SURGERY     left  . TOTAL KNEE ARTHROPLASTY     right    Family History  Problem Relation Age of Onset  . Cancer Father        colon ca  . Colon cancer Father   . Esophageal cancer Neg Hx   . Rectal cancer Neg Hx   . Stomach cancer Neg Hx     Social History   Socioeconomic History  . Marital status: Married    Spouse name: Not on file  . Number of children: Not on file  . Years of education: Not on file  . Highest education level: Not on file  Occupational History  . Not on file  Tobacco Use  . Smoking status: Never Smoker  . Smokeless tobacco: Current User    Types: Chew  Substance and Sexual Activity  . Alcohol use: Yes    Alcohol/week: 6.0 - 8.0 standard drinks    Types: 6 - 8 Cans of beer per week  . Drug use: No  . Sexual activity: Not on file  Other Topics Concern  . Not  on file  Social History Narrative  . Not on file   Social Determinants of Health   Financial Resource Strain:   . Difficulty of Paying Living Expenses: Not on file  Food Insecurity:   . Worried About Charity fundraiser in the Last Year: Not on file  . Ran Out of Food in the Last Year: Not on file  Transportation Needs:   . Lack of Transportation (Medical): Not on file  . Lack of Transportation (Non-Medical): Not on file  Physical Activity:   . Days of Exercise per Week: Not on file  . Minutes of Exercise per Session: Not on file  Stress:   . Feeling of Stress : Not on file  Social Connections:   . Frequency of Communication with Friends and Family: Not on file  . Frequency of Social Gatherings with Friends and Family: Not on file  . Attends Religious Services: Not on file  . Active Member of Clubs or Organizations: Not on file  . Attends Archivist  Meetings: Not on file  . Marital Status: Not on file  Intimate Partner Violence:   . Fear of Current or Ex-Partner: Not on file  . Emotionally Abused: Not on file  . Physically Abused: Not on file  . Sexually Abused: Not on file    Outpatient Medications Prior to Visit  Medication Sig Dispense Refill  . aspirin 81 MG tablet Take 162 mg by mouth daily.    Marland Kitchen atorvastatin (LIPITOR) 20 MG tablet TAKE 1 TABLET BY MOUTH EVERY DAY 90 tablet 0  . fenofibrate (TRICOR) 145 MG tablet Take 1 tablet (145 mg total) by mouth daily. 90 tablet 1  . lisinopril (ZESTRIL) 20 MG tablet TAKE 1 TABLET BY MOUTH EVERY DAY 90 tablet 0  . Multiple Vitamin (MULTIVITAMIN) tablet Take 1 tablet by mouth daily.    . pantoprazole (PROTONIX) 40 MG tablet Take 1 tablet (40 mg total) by mouth daily. 30 tablet 3  . betamethasone dipropionate 0.05 % cream Apply topically. (Patient not taking: Reported on 11/28/2019)    . triamcinolone cream (KENALOG) 0.1 % Apply topically. (Patient not taking: Reported on 11/28/2019)     No facility-administered medications prior to visit.    No Known Allergies  ROS Review of Systems  Constitutional: Negative.   HENT: Negative.   Eyes: Negative for photophobia and visual disturbance.  Respiratory: Negative.   Cardiovascular: Negative.   Gastrointestinal: Negative.   Endocrine: Negative for polyphagia and polyuria.  Genitourinary: Negative.   Musculoskeletal: Positive for arthralgias. Negative for gait problem.  Allergic/Immunologic: Negative for immunocompromised state.  Neurological: Negative for weakness and numbness.  Hematological: Does not bruise/bleed easily.  Psychiatric/Behavioral: Negative.       Objective:    Physical Exam Vitals and nursing note reviewed.  Constitutional:      General: He is not in acute distress.    Appearance: Normal appearance. He is normal weight. He is not ill-appearing, toxic-appearing or diaphoretic.  HENT:     Head: Normocephalic and  atraumatic.  Eyes:     General:        Right eye: No discharge.        Left eye: No discharge.     Extraocular Movements: Extraocular movements intact.     Conjunctiva/sclera: Conjunctivae normal.     Pupils: Pupils are equal, round, and reactive to light.  Cardiovascular:     Rate and Rhythm: Normal rate and regular rhythm.  Pulmonary:     Effort: Pulmonary  effort is normal.     Breath sounds: Normal breath sounds.  Musculoskeletal:     Left knee: Swelling and bony tenderness present. No effusion or erythema. Normal range of motion. Tenderness present over the medial joint line.     Instability Tests: Anterior drawer test negative.  Skin:    General: Skin is warm and dry.  Neurological:     Mental Status: He is alert.  Psychiatric:        Mood and Affect: Mood normal.        Behavior: Behavior normal.     BP 138/70   Pulse 70   Temp 97.8 F (36.6 C) (Tympanic)   Ht 5\' 10"  (1.778 m)   Wt 155 lb 3.2 oz (70.4 kg)   SpO2 98%   BMI 22.27 kg/m  Wt Readings from Last 3 Encounters:  11/28/19 155 lb 3.2 oz (70.4 kg)  11/02/19 155 lb (70.3 kg)  08/28/19 154 lb (69.9 kg)     Health Maintenance Due  Topic Date Due  . INFLUENZA VACCINE  09/30/2019    There are no preventive care reminders to display for this patient.  Lab Results  Component Value Date   TSH 0.81 07/14/2017   Lab Results  Component Value Date   WBC 5.3 07/06/2019   HGB 15.6 07/06/2019   HCT 41.0 07/06/2019   MCV 97.2 07/06/2019   PLT 352 07/06/2019   Lab Results  Component Value Date   NA 137 08/24/2019   K 4.0 08/24/2019   CO2 26 08/24/2019   GLUCOSE 97 08/24/2019   BUN 11 08/24/2019   CREATININE 1.09 08/24/2019   BILITOT 1.4 (H) 07/06/2019   ALKPHOS 119 (H) 07/06/2019   AST 100 (H) 07/06/2019   ALT 46 07/06/2019   PROT 7.0 07/06/2019   ALBUMIN 4.0 07/06/2019   CALCIUM 9.7 08/24/2019   GFR 66.93 08/24/2019   Lab Results  Component Value Date   CHOL 384 (H) 07/11/2019   Lab Results   Component Value Date   HDL 30.00 (L) 07/11/2019   Lab Results  Component Value Date   LDLCALC 61 08/22/2018   Lab Results  Component Value Date   TRIG (H) 07/11/2019    947.0 Triglyceride is over 400; calculations on Lipids are invalid.   Lab Results  Component Value Date   CHOLHDL 13 07/11/2019   No results found for: HGBA1C    Assessment & Plan:   Problem List Items Addressed This Visit      Cardiovascular and Mediastinum   Essential hypertension   Relevant Orders   Basic metabolic panel     Other   Dyslipidemia   Need for influenza vaccination - Primary   Relevant Orders   Flu Vaccine QUAD High Dose(Fluad)   Chronic pain of left knee   Relevant Medications   meloxicam (MOBIC) 7.5 MG tablet      Meds ordered this encounter  Medications  . meloxicam (MOBIC) 7.5 MG tablet    Sig: May take one daily as needed with food.    Dispense:  30 tablet    Refill:  1    Follow-up: Return in about 6 months (around 05/27/2020), or if symptoms worsen or fail to improve.  Have added as needed meloxicam.  He will use it only as needed.  Believe that it is a better choice for him when his decreased GFR.  We will follow this.  Libby Maw, MD

## 2019-12-07 ENCOUNTER — Telehealth: Payer: Self-pay | Admitting: Family Medicine

## 2019-12-07 NOTE — Progress Notes (Signed)
°  Chronic Care Management   Outreach Note  12/07/2019 Name: Timothy [name] MRN: [id number] DOB: 12/04/1949  Referred by: Libby Maw, MD Reason for referral : No chief complaint on file.   Third unsuccessful telephone outreach was attempted today. The patient was referred to the pharmacist for assistance with care management and care coordination.   Follow Up Plan:   Carley Perdue [employer] Scheduler

## 2020-01-22 ENCOUNTER — Ambulatory Visit (INDEPENDENT_AMBULATORY_CARE_PROVIDER_SITE_OTHER): Payer: Medicare Other

## 2020-01-22 VITALS — Ht 70.0 in | Wt 167.0 lb

## 2020-01-22 DIAGNOSIS — Z0000 Encounter for general adult medical examination without abnormal findings: Secondary | ICD-10-CM

## 2020-01-22 NOTE — Patient Instructions (Signed)
Timothy [name] , Thank you for taking time to complete your Medicare Wellness Visit. I appreciate your ongoing commitment to your health goals. Please review the following plan we discussed and let me know if I can assist you in the future.   Screening recommendations/referrals: Colonoscopy: Completed 02/14/2018- Due- 02/22/2023 Recommended yearly ophthalmology/optometry visit for glaucoma screening and checkup Recommended yearly dental visit for hygiene and checkup  Vaccinations: Influenza vaccine: Up to date Pneumococcal vaccine: Completed vaccines Tdap vaccine: Up to date- Due-11/15/2022 Shingles vaccine: Completed vaccines   Covid-19: Completed vaccines  Advanced directives: Please bring a copy for your chart  Conditions/risks identified: See problem list  Next appointment: Follow up in one year for your annual wellness visit. 01/27/2021 @ 2:15  Preventive Care 69 Years and Older, Male Preventive care refers to lifestyle choices and visits with your health care provider that can promote health and wellness. What does preventive care include?  A yearly physical exam. This is also called an annual well check.  Dental exams once or twice a year.  Routine eye exams. Ask your health care provider how often you should have your eyes checked.  Personal lifestyle choices, including:  Daily care of your teeth and gums.  Regular physical activity.  Eating a healthy diet.  Avoiding tobacco and drug use.  Limiting alcohol use.  Practicing safe sex.  Taking low doses of aspirin every day.  Taking vitamin and mineral supplements as recommended by your health care provider. What happens during an annual well check? The services and screenings done by your health care provider during your annual well check will depend on your age, overall health, lifestyle risk factors, and family history of disease. Counseling  Your health care provider may ask you questions about your:  Alcohol  use.  Tobacco use.  Drug use.  Emotional well-being.  Home and relationship well-being.  Sexual activity.  Eating habits.  History of falls.  Memory and ability to understand (cognition).  Work and work Statistician. Screening  You may have the following tests or measurements:  Height, weight, and BMI.  Blood pressure.  Lipid and cholesterol levels. These may be checked every 5 years, or more frequently if you are over 25 years old.  Skin check.  Lung cancer screening. You may have this screening every year starting at age 71 if you have a 30-pack-year history of smoking and currently smoke or have quit within the past 15 years.  Fecal occult blood test (FOBT) of the stool. You may have this test every year starting at age 17.  Flexible sigmoidoscopy or colonoscopy. You may have a sigmoidoscopy every 5 years or a colonoscopy every 10 years starting at age 25.  Prostate cancer screening. Recommendations will vary depending on your family history and other risks.  Hepatitis C blood test.  Hepatitis B blood test.  Sexually transmitted disease (STD) testing.  Diabetes screening. This is done by checking your blood sugar (glucose) after you have not eaten for a while (fasting). You may have this done every 1-3 years.  Abdominal aortic aneurysm (AAA) screening. You may need this if you are a current or former smoker.  Osteoporosis. You may be screened starting at age 20 if you are at high risk. Talk with your health care provider about your test results, treatment options, and if necessary, the need for more tests. Vaccines  Your health care provider may recommend certain vaccines, such as:  Influenza vaccine. This is recommended every year.  Tetanus, diphtheria, and  acellular pertussis (Tdap, Td) vaccine. You may need a Td booster every 10 years.  Zoster vaccine. You may need this after age 72.  Pneumococcal 13-valent conjugate (PCV13) vaccine. One dose is  recommended after age 48.  Pneumococcal polysaccharide (PPSV23) vaccine. One dose is recommended after age 17. Talk to your health care provider about which screenings and vaccines you need and how often you need them. This information is not intended to replace advice given to you by your health care provider. Make sure you discuss any questions you have with your health care provider. Document Released: 03/14/2015 Document Revised: 11/05/2015 Document Reviewed: 12/17/2014 Elsevier Interactive Patient Education  2017 [place] Prevention in the Home Falls can cause injuries. They can happen to people of all ages. There are many things you can do to make your home safe and to help prevent falls. What can I do on the outside of my home?  Regularly fix the edges of walkways and driveways and fix any cracks.  Remove anything that might make you trip as you walk through a door, such as a raised step or threshold.  Trim any bushes or trees on the path to your home.  Use bright outdoor lighting.  Clear any walking paths of anything that might make someone trip, such as rocks or tools.  Regularly check to see if handrails are loose or broken. Make sure that both sides of any steps have handrails.  Any raised decks and porches should have guardrails on the edges.  Have any leaves, snow, or ice cleared regularly.  Use sand or salt on walking paths during winter.  Clean up any spills in your garage right away. This includes oil or grease spills. What can I do in the bathroom?  Use night lights.  Install grab bars by the toilet and in the tub and shower. Do not use towel bars as grab bars.  Use non-skid mats or decals in the tub or shower.  If you need to sit down in the shower, use a plastic, non-slip stool.  Keep the floor dry. Clean up any water that spills on the floor as soon as it happens.  Remove soap buildup in the tub or shower regularly.  Attach bath mats  securely with double-sided non-slip rug tape.  Do not have throw rugs and other things on the floor that can make you trip. What can I do in the bedroom?  Use night lights.  Make sure that you have a light by your bed that is easy to reach.  Do not use any sheets or blankets that are too big for your bed. They should not hang down onto the floor.  Have a firm chair that has side arms. You can use this for support while you get dressed.  Do not have throw rugs and other things on the floor that can make you trip. What can I do in the kitchen?  Clean up any spills right away.  Avoid walking on wet floors.  Keep items that you use a lot in easy-to-reach places.  If you need to reach something above you, use a strong step stool that has a grab bar.  Keep electrical cords out of the way.  Do not use floor polish or wax that makes floors slippery. If you must use wax, use non-skid floor wax.  Do not have throw rugs and other things on the floor that can make you trip. What can I do with my stairs?  Do not leave any items on the stairs.  Make sure that there are handrails on both sides of the stairs and use them. Fix handrails that are broken or loose. Make sure that handrails are as long as the stairways.  Check any carpeting to make sure that it is firmly attached to the stairs. Fix any carpet that is loose or worn.  Avoid having throw rugs at the top or bottom of the stairs. If you do have throw rugs, attach them to the floor with carpet tape.  Make sure that you have a light switch at the top of the stairs and the bottom of the stairs. If you do not have them, ask someone to add them for you. What else can I do to help prevent falls?  Wear shoes that:  Do not have high heels.  Have rubber bottoms.  Are comfortable and fit you well.  Are closed at the toe. Do not wear sandals.  If you use a stepladder:  Make sure that it is fully opened. Do not climb a closed  stepladder.  Make sure that both sides of the stepladder are locked into place.  Ask someone to hold it for you, if possible.  Clearly mark and make sure that you can see:  Any grab bars or handrails.  First and last steps.  Where the edge of each step is.  Use tools that help you move around (mobility aids) if they are needed. These include:  Canes.  Walkers.  Scooters.  Crutches.  Turn on the lights when you go into a dark area. Replace any light bulbs as soon as they burn out.  Set up your furniture so you have a clear path. Avoid moving your furniture around.  If any of your floors are uneven, fix them.  If there are any pets around you, be aware of where they are.  Review your medicines with your doctor. Some medicines can make you feel dizzy. This can increase your chance of falling. Ask your doctor what other things that you can do to help prevent falls. This information is not intended to replace advice given to you by your health care provider. Make sure you discuss any questions you have with your health care provider. Document Released: 12/12/2008 Document Revised: 07/24/2015 Document Reviewed: 03/22/2014 Elsevier Interactive Patient Education  2017 [employer].

## 2020-01-22 NOTE — Progress Notes (Signed)
Subjective:   Timothy [name] is a 69 y.o. male who presents for Medicare Annual/Subsequent preventive examination.  I connected with Bunnie today by telephone and verified that I am speaking with the correct person using two identifiers. Location patient: home Location provider: work Persons participating in the virtual visit: patient, Marine scientist.    I discussed the limitations, risks, security and privacy concerns of performing an evaluation and management service by telephone and the availability of in person appointments. I also discussed with the patient that there may be a patient responsible charge related to this service. The patient expressed understanding and verbally consented to this telephonic visit.    Interactive audio and video telecommunications were attempted between this provider and patient, however failed, due to patient having technical difficulties OR patient did not have access to video capability.  We continued and completed visit with audio only.  Some vital signs may be absent or patient reported.   Time Spent with patient on telephone encounter: 25 minutes  Review of Systems     Cardiac Risk Factors include: advanced age (>63men, >48 women);hypertension;dyslipidemia;male gender     Objective:    Today's Vitals   01/22/20 1416  Weight: 167 lb (75.8 kg)  Height: 5\' 10"  (1.778 m)   Body mass index is 23.96 kg/m.  Advanced Directives 01/22/2020 01/17/2019  Does Patient Have a Medical Advance Directive? Yes Yes  Type of Paramedic of [place];Living will [place];Living will  Does patient want to make changes to medical advance directive? - No - Patient declined  Copy of [place] in Chart? No - copy requested No - copy requested    Current Medications (verified) Outpatient Encounter Medications as of 01/22/2020  Medication Sig  . aspirin 81 MG tablet Take 162 mg by mouth daily.  Marland Kitchen  atorvastatin (LIPITOR) 20 MG tablet TAKE 1 TABLET BY MOUTH EVERY DAY  . fenofibrate (TRICOR) 145 MG tablet Take 1 tablet (145 mg total) by mouth daily.  Marland Kitchen lisinopril (ZESTRIL) 20 MG tablet TAKE 1 TABLET BY MOUTH EVERY DAY  . meloxicam (MOBIC) 7.5 MG tablet May take one daily as needed with food.  . Multiple Vitamin (MULTIVITAMIN) tablet Take 1 tablet by mouth daily.  . pantoprazole (PROTONIX) 40 MG tablet Take 1 tablet (40 mg total) by mouth daily.  . betamethasone dipropionate 0.05 % cream Apply topically. (Patient not taking: Reported on 11/28/2019)  . triamcinolone cream (KENALOG) 0.1 % Apply topically. (Patient not taking: Reported on 11/28/2019)   No facility-administered encounter medications on file as of 01/22/2020.    Allergies (verified) Patient has no known allergies.   History: Past Medical History:  Diagnosis Date  . Atypical chest pain 07/13/2019  . Cataract   . Chronic kidney disease    kidney stones  . COLONIC POLYPS, HX OF 02/12/2009  . Dyslipidemia 11/21/2007   Qualifier: Diagnosis of  By: Burnice Logan  MD, Doretha Sou   . Elevated LFTs 09/02/2018  . Essential hypertension 04/07/2016   Treatment initiated February 2018  . Follow-up examination after eye surgery 07/19/2019  . Healthcare maintenance 08/22/2018  . 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 HYPERLIPIDEMIA 11/21/2007  . Hypertension   . Left epiretinal membrane 07/19/2019  . Need for influenza vaccination 12/22/2017  . Need for shingles vaccine 09/02/2018  . 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 OSTEOARTHRITIS 01/17/2007  . Osteoarthritis 01/17/2007   Qualifier: Diagnosis of  By: Burnice Logan  MD, Doretha Sou   .  Posterior vitreous detachment of left eye 07/19/2019  . Right epiretinal membrane 07/19/2019  . Subacromial impingement 01/16/2019   Past Surgical History:  Procedure Laterality Date  . APPENDECTOMY    . CARPAL TUNNEL RELEASE    . CATARACT EXTRACTION     june 2019  . HERNIA REPAIR     ingunial  .  KNEE ARTHROSCOPY     bilateral  . SHOULDER SURGERY     left  . TOTAL KNEE ARTHROPLASTY     right   Family History  Problem Relation Age of Onset  . Cancer Father        colon ca  . Colon cancer Father   . Esophageal cancer Neg Hx   . Rectal cancer Neg Hx   . Stomach cancer Neg Hx    Social History   Socioeconomic History  . Marital status: Married    Spouse name: Not on file  . Number of children: Not on file  . Years of education: Not on file  . Highest education level: Not on file  Occupational History  . Not on file  Tobacco Use  . Smoking status: Never Smoker  . Smokeless tobacco: Current User    Types: Chew  Substance and Sexual Activity  . Alcohol use: Yes    Alcohol/week: 6.0 - 8.0 standard drinks    Types: 6 - 8 Cans of beer per week  . Drug use: No  . Sexual activity: Not on file  Other Topics Concern  . Not on file  Social History Narrative  . Not on file   Social Determinants of Health   Financial Resource Strain: Low Risk   . Difficulty of Paying Living Expenses: Not hard at all  Food Insecurity: No Food Insecurity  . Worried About Charity fundraiser in the Last Year: Never true  . Ran Out of Food in the Last Year: Never true  Transportation Needs: No Transportation Needs  . Lack of Transportation (Medical): No  . Lack of Transportation (Non-Medical): No  Physical Activity: Sufficiently Active  . Days of Exercise per Week: 5 days  . Minutes of Exercise per Session: 40 min  Stress: No Stress Concern Present  . Feeling of Stress : Not at all  Social Connections: Socially Integrated  . Frequency of Communication with Friends and Family: More than three times a week  . Frequency of Social Gatherings with Friends and Family: More than three times a week  . Attends Religious Services: More than 4 times per year  . Active Member of Clubs or Organizations: Yes  . Attends Archivist Meetings: More than 4 times per year  . Marital Status:  Married    Tobacco Counseling Ready to quit: Not Answered Counseling given: Not Answered   Clinical Intake:  Pre-visit preparation completed: Yes  Pain : No/denies pain     Nutritional Status: BMI of 19-24  Normal Nutritional Risks: None Diabetes: No  How often do you need to have someone help you when you read instructions, pamphlets, or other written materials from your doctor or pharmacy?: 1 - Never What is the last grade level you completed in school?: College  Diabetic?No  Interpreter Needed?: No  Information entered by :: Caroleen Hamman LPN   Activities of Daily Living In your present state of health, do you have any difficulty performing the following activities: 01/22/2020  Hearing? Y  Comment hearing loss  Vision? N  Difficulty concentrating or making decisions? N  Walking or  climbing stairs? N  Dressing or bathing? N  Doing errands, shopping? N  Preparing Food and eating ? N  Using the Toilet? N  In the past six months, have you accidently leaked urine? N  Do you have problems with loss of bowel control? N  Managing your Medications? N  Managing your Finances? N  Housekeeping or managing your Housekeeping? N  Some recent data might be hidden    Patient Care Team: Libby Maw, MD as PCP - General (Family Medicine)  Indicate any recent Medical Services you may have received from other than [facility] providers in the past year (date may be approximate).     Assessment:   This is a routine wellness examination for Vester.  Hearing/Vision screen  Hearing Screening   125Hz  250Hz  500Hz  1000Hz  2000Hz  3000Hz  4000Hz  6000Hz  8000Hz   Right ear:           Left ear:           Comments: Wears hearing aids  Vision Screening Comments: Cataracts removed Last eye exam-10/2019-Dr. Rankin   Dietary issues and exercise activities discussed: Current Exercise Habits: Home exercise routine, Type of exercise: strength training/weights;treadmill (swimming), Time  (Minutes): 45, Frequency (Times/Week): 5, Weekly Exercise (Minutes/Week): 225, Intensity: Moderate, Exercise limited by: None identified  Goals    . Maintain healthy active lifestyle.      Depression Screen PHQ 2/9 Scores 01/22/2020 08/24/2019 01/17/2019 05/20/2015 05/09/2014  PHQ - 2 Score 0 0 0 0 0    Fall Risk Fall Risk  01/22/2020 08/24/2019 01/17/2019 05/20/2015 05/09/2014  Falls in the past year? 0 0 0 Yes No  Number falls in past yr: 0 - 0 1 -  Injury with Fall? 0 - 0 No -  Risk for fall due to : - - - Impaired balance/gait -  Risk for fall due to: Comment - - - Slipped on ice -  Follow up Falls prevention discussed - Education provided;Falls prevention discussed - -    Any stairs in or around the home? Yes  If so, are there any without handrails? No  Home free of loose throw rugs in walkways, pet beds, electrical cords, etc? Yes  Adequate lighting in your home to reduce risk of falls? Yes   ASSISTIVE DEVICES UTILIZED TO PREVENT FALLS:  Life alert? No  Use of a cane, walker or w/c? No  Grab bars in the bathroom? Yes  Shower chair or bench in shower? No  Elevated toilet seat or a handicapped toilet? No   TIMED UP AND GO:  Was the test performed? No .phone visit    Cognitive Function:No cognitive impairment noted        Immunizations Immunization History  Administered Date(s) Administered  . Fluad Quad(high Dose 65+) 10/24/2018, 11/28/2019  . Influenza Split 01/04/2011  . Influenza Whole 01/19/2007, 11/21/2007, 12/24/2008, 02/13/2010  . Influenza, High Dose Seasonal PF 01/07/2015, 01/30/2016, 12/17/2016, 12/22/2017  . Influenza,inj,Quad PF,6+ Mos 11/14/2012, 01/02/2014  . Moderna SARS-COVID-2 Vaccination 04/11/2019, 05/11/2019  . Pneumococcal Conjugate-13 05/20/2015  . Pneumococcal Polysaccharide-23 12/17/2016  . Tdap 11/14/2012  . Zoster 06/08/2010  . Zoster Recombinat (Shingrix) 08/22/2018, 10/24/2018    TDAP status: Up to date   Flu Vaccine status: Up to  date   Pneumococcal vaccine status: Up to date   Covid-19 vaccine status: Completed vaccines  Qualifies for Shingles Vaccine? No   Zostavax completed Yes   Shingrix Completed?: Yes  Screening Tests Health Maintenance  Topic Date Due  . TETANUS/TDAP  11/15/2022  .  COLONOSCOPY  02/15/2023  . INFLUENZA VACCINE  Completed  . COVID-19 Vaccine  Completed  . Hepatitis C Screening  Completed  . PNA vac Low Risk Adult  Completed    Health Maintenance  There are no preventive care reminders to display for this patient.  Colorectal cancer screening: Completed Colonoscopy 02/14/2018. Repeat every 5 years  Lung Cancer Screening: (Low Dose CT Chest recommended if Age 34-80 years, 30 pack-year currently smoking OR have quit w/in 15years.) does not qualify.    Additional Screening:  Hepatitis C Screening:Completed 05/20/2015  Vision Screening: Recommended annual ophthalmology exams for early detection of glaucoma and other disorders of the eye. Is the patient up to date with their annual eye exam?  Yes  Who is the provider or what is the name of the office in which the patient attends annual eye exams? Dr. Zadie Rhine   Dental Screening: Recommended annual dental exams for proper oral hygiene  Community Resource Referral / Chronic Care Management: CRR required this visit?  No   CCM required this visit?  No      Plan:     I have personally reviewed and noted the following in the patient's chart:   . Medical and social history . Use of alcohol, tobacco or illicit drugs  . Current medications and supplements . Functional ability and status . Nutritional status . Physical activity . Advanced directives . List of other physicians . Hospitalizations, surgeries, and ER visits in previous 12 months . Vitals . Screenings to include cognitive, depression, and falls . Referrals and appointments  In addition, I have reviewed and discussed with patient certain preventive protocols,  quality metrics, and best practice recommendations. A written personalized care plan for preventive services as well as general preventive health recommendations were provided to patient.  Due to this being a telephonic visit, the after visit summary with patients personalized plan was offered to patient via mail or my-chart.  Patient would like to access on my-chart.   Marta Antu, LPN   [id number]  Nurse Health Advisor  Nurse Notes: None

## 2020-01-30 DIAGNOSIS — U071 COVID-19: Secondary | ICD-10-CM

## 2020-01-30 HISTORY — DX: COVID-19: U07.1

## 2020-02-26 ENCOUNTER — Telehealth: Payer: Self-pay | Admitting: Family Medicine

## 2020-02-26 NOTE — Telephone Encounter (Signed)
Patients wife Luster Landsberg is calling in regards to patient. She states that patient is symptomatic for COVID. They will be getting tested today, but wanted to be advised on what to do next because patient is not feeling well. Patients wife is aware that there is no provider in office today. Please give them a call back at [phone].

## 2020-02-26 NOTE — Telephone Encounter (Signed)
Spoke with patients wife who states that patient was exposed to COVID last week and have started with low grade fever and headache. They have Rapid home test at home but was told to wait 5-7 days before testing. Not sure what they should do after results. Advised to continue to keep an eye on symptoms if patient becomes worse to give [place] a call if results are positive they can contact the COVID clinic at [phone]. Per Timothy [name] she will give [place] a call back if there are any other concerns.

## 2020-03-17 ENCOUNTER — Other Ambulatory Visit: Payer: Self-pay | Admitting: Family Medicine

## 2020-03-26 ENCOUNTER — Other Ambulatory Visit: Payer: Self-pay | Admitting: Family Medicine

## 2020-04-10 DIAGNOSIS — L814 Other melanin hyperpigmentation: Secondary | ICD-10-CM | POA: Diagnosis not present

## 2020-04-10 DIAGNOSIS — L111 Transient acantholytic dermatosis [Grover]: Secondary | ICD-10-CM | POA: Diagnosis not present

## 2020-04-10 DIAGNOSIS — L821 Other seborrheic keratosis: Secondary | ICD-10-CM | POA: Diagnosis not present

## 2020-04-10 DIAGNOSIS — L570 Actinic keratosis: Secondary | ICD-10-CM | POA: Diagnosis not present

## 2020-04-10 DIAGNOSIS — D225 Melanocytic nevi of trunk: Secondary | ICD-10-CM | POA: Diagnosis not present

## 2020-04-19 ENCOUNTER — Other Ambulatory Visit: Payer: Self-pay | Admitting: Family Medicine

## 2020-04-21 ENCOUNTER — Telehealth: Payer: Self-pay | Admitting: Family Medicine

## 2020-04-21 ENCOUNTER — Other Ambulatory Visit: Payer: Self-pay

## 2020-04-21 DIAGNOSIS — K219 Gastro-esophageal reflux disease without esophagitis: Secondary | ICD-10-CM

## 2020-04-21 MED ORDER — PANTOPRAZOLE SODIUM 40 MG PO TBEC: 40.0000 mg | DELAYED_RELEASE_TABLET | Freq: Every day | ORAL | 3 refills | Status: DC

## 2020-04-21 NOTE — Telephone Encounter (Signed)
Patient aware that Rx sent in  °

## 2020-04-21 NOTE — Telephone Encounter (Signed)
Patient is calling to get a refill on his Pantoprazole. If approved, please send to [facility] on [address] and call [phone] to let patient know that it has been sent in.

## 2020-04-30 DIAGNOSIS — H269 Unspecified cataract: Secondary | ICD-10-CM | POA: Insufficient documentation

## 2020-04-30 DIAGNOSIS — N189 Chronic kidney disease, unspecified: Secondary | ICD-10-CM | POA: Insufficient documentation

## 2020-04-30 DIAGNOSIS — I10 Essential (primary) hypertension: Secondary | ICD-10-CM | POA: Insufficient documentation

## 2020-05-07 ENCOUNTER — Encounter: Payer: Self-pay | Admitting: Cardiology

## 2020-05-07 ENCOUNTER — Ambulatory Visit (INDEPENDENT_AMBULATORY_CARE_PROVIDER_SITE_OTHER): Payer: Medicare Other | Admitting: Cardiology

## 2020-05-07 ENCOUNTER — Other Ambulatory Visit: Payer: Self-pay

## 2020-05-07 VITALS — BP 138/76 | HR 90 | Ht 69.5 in | Wt 153.0 lb

## 2020-05-07 DIAGNOSIS — E785 Hyperlipidemia, unspecified: Secondary | ICD-10-CM

## 2020-05-07 DIAGNOSIS — R0789 Other chest pain: Secondary | ICD-10-CM

## 2020-05-07 DIAGNOSIS — I10 Essential (primary) hypertension: Secondary | ICD-10-CM

## 2020-05-07 DIAGNOSIS — U071 COVID-19: Secondary | ICD-10-CM | POA: Diagnosis not present

## 2020-05-07 NOTE — Progress Notes (Signed)
Cardiology Office Note:    Date:  05/07/2020   ID:  Timothy [name], DOB 12/04/1949, MRN [id number]  PCP:  Libby Maw, MD  Cardiologist:  [name], MD    Referring MD: Libby Maw,*   Chief Complaint  Patient presents with  . Follow-up  Doing fine but had Covid  History of Present Illness:    Timothy [name] is a 70 y.o. male with past medical history significant for atypical chest pain that is the reason why I am seeing him.  In the summer he did have a stress test done which showed no evidence of ischemia at the same time he was put on proton pump inhibitor and all symptoms completely resolved.  He comes today 2 months of follow-up.  In December he ended up having COVID-19 infection.  He did have relatively mild course the only problem sequela of it right now is the fact that he have no taste no smell.  He made it very interesting observation when he got his first vaccination against Covid he did have a headache which she never had before.  After second vaccine he ended up having this belly/chest pain-like sensation that brought him to our office now when he end up having Covid in December he started having the same kind of chest/abdominal pain again.  All the symptoms are subsiding right now.  He is back to the gym he is able to do aerobic exercises without difficulties without chest pain.  Past Medical History:  Diagnosis Date  . Atypical chest pain 07/13/2019  . Bilateral hearing loss 08/24/2019  . Cataract   . Chronic kidney disease    kidney stones  . Chronic pain of left knee 11/28/2019  . COLONIC POLYPS, HX OF 02/12/2009  . COVID-19 01/2020  . Dyslipidemia 11/21/2007   Qualifier: Diagnosis of  By: Burnice Logan  MD, Doretha Sou   . Elevated LFTs 09/02/2018  . Essential hypertension 04/07/2016   Treatment initiated February 2018  . Follow-up examination after eye surgery 07/19/2019  . Healthcare maintenance 08/22/2018  . History of total knee  arthroplasty 12/09/2017  . HYPERLIPIDEMIA 11/21/2007  . Hypertension   . Hypertriglyceridemia 08/15/2019  . Left epiretinal membrane 07/19/2019  . Need for influenza vaccination 12/22/2017  . Need for shingles vaccine 09/02/2018  . 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 OSTEOARTHRITIS 01/17/2007  . Osteoarthritis 01/17/2007   Qualifier: Diagnosis of  By: Burnice Logan  MD, Doretha Sou   . Posterior vitreous detachment of left eye 07/19/2019  . Right epiretinal membrane 07/19/2019  . Steatosis of liver 08/24/2019  . Subacromial impingement 01/16/2019    Past Surgical History:  Procedure Laterality Date  . APPENDECTOMY    . CARPAL TUNNEL RELEASE    . CATARACT EXTRACTION     june 2019  . HERNIA REPAIR     ingunial  . KNEE ARTHROSCOPY     bilateral  . SHOULDER SURGERY     left  . TOTAL KNEE ARTHROPLASTY     right    Current Medications: Current Meds  Medication Sig  . atorvastatin (LIPITOR) 20 MG tablet TAKE 1 TABLET BY MOUTH EVERY DAY  . lisinopril (ZESTRIL) 20 MG tablet TAKE 1 TABLET BY MOUTH EVERY DAY  . Multiple Vitamin (MULTIVITAMIN) tablet Take 1 tablet by mouth daily.  . pantoprazole (PROTONIX) 40 MG tablet Take 1 tablet (40 mg total) by mouth daily.     Allergies:   Patient has no known allergies.   Social History  Socioeconomic History  . Marital status: Married    Spouse name: Not on file  . Number of children: Not on file  . Years of education: Not on file  . Highest education level: Not on file  Occupational History  . Not on file  Tobacco Use  . Smoking status: Never Smoker  . Smokeless tobacco: Current User    Types: Chew  Substance and Sexual Activity  . Alcohol use: Yes    Alcohol/week: 6.0 - 8.0 standard drinks    Types: 6 - 8 Cans of beer per week  . Drug use: No  . Sexual activity: Not on file  Other Topics Concern  . Not on file  Social History Narrative  . Not on file   Social Determinants of Health   Financial Resource Strain: Low Risk   .  Difficulty of Paying Living Expenses: Not hard at all  Food Insecurity: No Food Insecurity  . Worried About Charity fundraiser in the Last Year: Never true  . Ran Out of Food in the Last Year: Never true  Transportation Needs: No Transportation Needs  . Lack of Transportation (Medical): No  . Lack of Transportation (Non-Medical): No  Physical Activity: Sufficiently Active  . Days of Exercise per Week: 5 days  . Minutes of Exercise per Session: 40 min  Stress: No Stress Concern Present  . Feeling of Stress : Not at all  Social Connections: Socially Integrated  . Frequency of Communication with Friends and Family: More than three times a week  . Frequency of Social Gatherings with Friends and Family: More than three times a week  . Attends Religious Services: More than 4 times per year  . Active Member of Clubs or Organizations: Yes  . Attends Archivist Meetings: More than 4 times per year  . Marital Status: Married     Family History: The patient's family history includes Cancer in his father; Colon cancer in his father. There is no history of Esophageal cancer, Rectal cancer, or Stomach cancer. ROS:   Please see the history of present illness.    All 14 point review of systems negative except as described per history of present illness  EKGs/Labs/Other Studies Reviewed:      Recent Labs: 07/06/2019: ALT 46; Hemoglobin 15.6; Platelets 352 11/28/2019: BUN 14; Creatinine, Ser 1.19; Potassium 4.4; Sodium 135  Recent Lipid Panel    Component Value Date/Time   CHOL 384 (H) 07/11/2019 0829   TRIG (H) 07/11/2019 0829    947.0 Triglyceride is over 400; calculations on Lipids are invalid.   HDL 30.00 (L) 07/11/2019 0829   CHOLHDL 13 07/11/2019 0829   VLDL 18.0 07/14/2017 1112   LDLCALC 61 08/22/2018 1109   LDLDIRECT 75.0 07/11/2019 0829    Physical Exam:    VS:  BP 138/76 (BP Location: Right Arm, Patient Position: Sitting)   Pulse 90   Ht 5' 9.5" (1.765 m)   Wt 153  lb (69.4 kg)   SpO2 99%   BMI 22.27 kg/m     Wt Readings from Last 3 Encounters:  05/07/20 153 lb (69.4 kg)  01/22/20 167 lb (75.8 kg)  11/28/19 155 lb 3.2 oz (70.4 kg)     GEN:  Well nourished, well developed in no acute distress HEENT: Normal NECK: No JVD; No carotid bruits LYMPHATICS: No lymphadenopathy CARDIAC: RRR, no murmurs, no rubs, no gallops RESPIRATORY:  Clear to auscultation without rales, wheezing or rhonchi  ABDOMEN: Soft, non-tender, non-distended MUSCULOSKELETAL:  No edema; No deformity  SKIN: Warm and dry LOWER EXTREMITIES: no swelling NEUROLOGIC:  Alert and oriented x 3 PSYCHIATRIC:  Normal affect   ASSESSMENT:    1. Essential hypertension   2. COVID-19   3. Dyslipidemia   4. Atypical chest pain    PLAN:    In order of problems listed above:  1. Essential hypertension, blood pressure seems to well controlled continue present management. 2. Dyslipidemia, I did review laboratory test done by primary care physician which show LDL of 75.  He is progressing elsewhere however still elevated 384.  He is taking statin with in form of Lipitor 20 which I will continue.  We did talk about need to exercise on the regular basis which she does.  That should help with triglycerides. 3. Atypical chest pain gone right now.  Doing well doing exercise with no difficulties.   Medication Adjustments/Labs and Tests Ordered: Current medicines are reviewed at length with the patient today.  Concerns regarding medicines are outlined above.  No orders of the defined types were placed in this encounter.  Medication changes: No orders of the defined types were placed in this encounter.   Signed, Park Liter, MD, [facility] 05/07/2020 8:49 AM    [place]

## 2020-05-07 NOTE — Patient Instructions (Signed)

## 2020-05-15 DIAGNOSIS — L308 Other specified dermatitis: Secondary | ICD-10-CM | POA: Diagnosis not present

## 2020-05-15 DIAGNOSIS — L821 Other seborrheic keratosis: Secondary | ICD-10-CM | POA: Diagnosis not present

## 2020-05-15 DIAGNOSIS — L570 Actinic keratosis: Secondary | ICD-10-CM | POA: Diagnosis not present

## 2020-06-09 ENCOUNTER — Other Ambulatory Visit: Payer: Self-pay

## 2020-06-09 ENCOUNTER — Ambulatory Visit (INDEPENDENT_AMBULATORY_CARE_PROVIDER_SITE_OTHER): Payer: Medicare Other | Admitting: Internal Medicine

## 2020-06-09 ENCOUNTER — Encounter: Payer: Self-pay | Admitting: Internal Medicine

## 2020-06-09 VITALS — BP 154/74 | HR 109 | Ht 69.5 in | Wt 153.2 lb

## 2020-06-09 DIAGNOSIS — R112 Nausea with vomiting, unspecified: Secondary | ICD-10-CM

## 2020-06-09 NOTE — Patient Instructions (Signed)
As discussed with Dr. Henrene Pastor, discontinue your Pantoprazole.  Please follow up in 3 months

## 2020-06-09 NOTE — Progress Notes (Signed)
HISTORY OF PRESENT ILLNESS:  Timothy [name] is a 70 y.o. male with past medical history as outlined below who was self-referred (at the request of his wife) regarding episodic nausea with vomiting.  Patient was last seen in this facility February 14, 2018 regarding surveillance colonoscopy.  He was found to have a tubular adenoma.  The exam was otherwise unremarkable.  Follow-up in 5 years recommended.  Patient tells me that he was in his usual state of health until February 2020 when after receiving a Covid vaccination he developed problems with nausea and vomiting.  His PCP felt this may represent acid reflux and placed him on pantoprazole 40 mg daily.  His symptoms persisted but slowly improved over the course of 2 to 3 weeks.  Thereafter, no problems until December 2021 after developing acute Covid infection.  At that time he lost his sense of taste.  Had issues with nausea and some vomiting for about for 5 days.  He continued on his PPI throughout.  No problems since.  He has not regained his normal sense of taste.  He has lost about 8 or 9 pounds overall but is gaining weight back.  He feels good.  His GI review of systems is entirely negative.  He denies a prior history of reflux symptoms.  He is wondering whether he needs to stay on PPI.  No dysphagia.  No abdominal pain.  He remains active.  Review of blood work from September 2021 shows normal basic metabolic panel.  CBC from May 2021 shows normal hemoglobin of 15.6.  He did have mildly elevated hepatic transaminases in May 2001 for which an abdominal ultrasound was performed.  He was found to have echo characteristics of fatty liver.  Otherwise unremarkable study.  REVIEW OF SYSTEMS:  All non-GI ROS negative unless otherwise stated in the HPI except for hearing loss  Past Medical History:  Diagnosis Date  . Atypical chest pain 07/13/2019  . Bilateral hearing loss 08/24/2019  . Cataract   . Chronic kidney disease    kidney stones  .  Chronic pain of left knee 11/28/2019  . COLONIC POLYPS, HX OF 02/12/2009  . COVID-19 01/2020  . Dyslipidemia 11/21/2007   Qualifier: Diagnosis of  By: Burnice Logan  MD, Doretha Sou   . Elevated LFTs 09/02/2018  . Essential hypertension 04/07/2016   Treatment initiated February 2018  . Follow-up examination after eye surgery 07/19/2019  . Healthcare maintenance 08/22/2018  . 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 HYPERLIPIDEMIA 11/21/2007  . Hypertension   . Hypertriglyceridemia 08/15/2019  . Left epiretinal membrane 07/19/2019  . Need for influenza vaccination 12/22/2017  . Need for shingles vaccine 09/02/2018  . 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 OSTEOARTHRITIS 01/17/2007  . Osteoarthritis 01/17/2007   Qualifier: Diagnosis of  By: Burnice Logan  MD, Doretha Sou   . Posterior vitreous detachment of left eye 07/19/2019  . Right epiretinal membrane 07/19/2019  . Steatosis of liver 08/24/2019  . Subacromial impingement 01/16/2019    Past Surgical History:  Procedure Laterality Date  . APPENDECTOMY    . CARPAL TUNNEL RELEASE    . CATARACT EXTRACTION     june 2019  . HERNIA REPAIR     ingunial  . KNEE ARTHROSCOPY     bilateral  . SHOULDER SURGERY     left  . TOTAL KNEE ARTHROPLASTY     right    Social History [name]  reports that he has never smoked. His smokeless tobacco use  includes chew. He reports current alcohol use of about 6.0 - 8.0 standard drinks of alcohol per week. He reports that he does not use drugs.  family history includes Cancer in his father; Colon cancer in his father.  No Known Allergies     PHYSICAL EXAMINATION: Vital signs: BP (!) 154/74   Pulse (!) 109   Ht 5' 9.5" (1.765 m)   Wt 153 lb 3.2 oz (69.5 kg)   SpO2 98%   BMI 22.30 kg/m   Constitutional: generally well-appearing, no acute distress Psychiatric: alert and oriented x3, cooperative Eyes: extraocular movements intact, anicteric, conjunctiva pink Mouth: oral pharynx moist, no  lesions Neck: supple no lymphadenopathy Cardiovascular: heart regular rate and rhythm, no murmur Lungs: clear to auscultation bilaterally Abdomen: soft, nontender, nondistended, no obvious ascites, no peritoneal signs, normal bowel sounds, no organomegaly Rectal: Omitted Extremities: no clubbing, cyanosis, or lower extremity edema bilaterally Skin: no lesions on visible extremities Neuro: No focal deficits.  Hearing aids.    ASSESSMENT:  1.  Episodic problems with nausea and vomiting as described. 2.  Empirically placed on PPI.  Not sure that this patient has GERD 3.  History of adenomatous colon polyps.  Surveillance up-to-date   PLAN:  1.  Discontinue PPI 2.  Routine office follow-up 3 months.  Contact the office in the interim should he have recurrent problems off PPI.  If so, I would set him up for upper endoscopy.  He agrees. 3.  Surveillance colonoscopy around December 2024 A total time of 30 minutes was spent preparing to see the patient, reviewing laboratories and x-rays.  Obtaining comprehensive history and performing medically appropriate comprehensive physical examination.  Counseling the patient regarding his above listed issues.  Directing his medical therapy and arranging follow-up.  Finally, documenting clinical information in the health record

## 2020-06-15 ENCOUNTER — Other Ambulatory Visit: Payer: Self-pay | Admitting: Family

## 2020-06-26 ENCOUNTER — Other Ambulatory Visit: Payer: Self-pay | Admitting: Family

## 2020-07-01 ENCOUNTER — Encounter: Payer: Self-pay | Admitting: Family Medicine

## 2020-07-01 ENCOUNTER — Other Ambulatory Visit: Payer: Self-pay

## 2020-07-01 MED ORDER — LISINOPRIL 20 MG PO TABS: 1.0000 | ORAL_TABLET | Freq: Every day | ORAL | 0 refills | Status: DC

## 2020-08-18 ENCOUNTER — Other Ambulatory Visit: Payer: Self-pay | Admitting: Physician Assistant

## 2020-08-18 ENCOUNTER — Other Ambulatory Visit (HOSPITAL_COMMUNITY): Payer: Self-pay | Admitting: Physician Assistant

## 2020-08-18 DIAGNOSIS — M25561 Pain in right knee: Secondary | ICD-10-CM | POA: Diagnosis not present

## 2020-08-18 DIAGNOSIS — Z96651 Presence of right artificial knee joint: Secondary | ICD-10-CM

## 2020-08-18 DIAGNOSIS — M25551 Pain in right hip: Secondary | ICD-10-CM | POA: Diagnosis not present

## 2020-08-18 DIAGNOSIS — M25562 Pain in left knee: Secondary | ICD-10-CM | POA: Diagnosis not present

## 2020-08-25 ENCOUNTER — Ambulatory Visit (HOSPITAL_COMMUNITY)
Admission: RE | Admit: 2020-08-25 | Discharge: 2020-08-25 | Disposition: A | Discharge status: Home / Self Care | Payer: Medicare Other | Source: Ambulatory Visit | Attending: Physician Assistant | Admitting: Physician Assistant

## 2020-08-25 ENCOUNTER — Other Ambulatory Visit: Payer: Self-pay

## 2020-08-25 DIAGNOSIS — M25561 Pain in right knee: Secondary | ICD-10-CM | POA: Diagnosis not present

## 2020-08-25 DIAGNOSIS — M7989 Other specified soft tissue disorders: Secondary | ICD-10-CM | POA: Diagnosis not present

## 2020-08-25 DIAGNOSIS — Z96651 Presence of right artificial knee joint: Secondary | ICD-10-CM | POA: Insufficient documentation

## 2020-08-25 MED ORDER — TECHNETIUM TC 99M MEDRONATE IV KIT
20.0000 | PACK | Freq: Once | INTRAVENOUS | Status: AC | PRN
  Administered 2020-08-25: 20.7 via INTRAVENOUS

## 2020-09-11 DIAGNOSIS — Z96651 Presence of right artificial knee joint: Secondary | ICD-10-CM | POA: Diagnosis not present

## 2020-09-23 DIAGNOSIS — M25561 Pain in right knee: Secondary | ICD-10-CM | POA: Diagnosis not present

## 2020-09-27 ENCOUNTER — Other Ambulatory Visit: Payer: Self-pay | Admitting: Family Medicine

## 2020-10-02 ENCOUNTER — Other Ambulatory Visit: Payer: Self-pay | Admitting: Family Medicine

## 2020-10-02 MED ORDER — LISINOPRIL 20 MG PO TABS: 20.0000 mg | ORAL_TABLET | Freq: Every day | ORAL | 0 refills | Status: DC

## 2020-10-03 DIAGNOSIS — M25561 Pain in right knee: Secondary | ICD-10-CM | POA: Diagnosis not present

## 2020-10-10 ENCOUNTER — Other Ambulatory Visit (INDEPENDENT_AMBULATORY_CARE_PROVIDER_SITE_OTHER): Payer: Medicare Other

## 2020-10-10 ENCOUNTER — Encounter: Payer: Self-pay | Admitting: Family Medicine

## 2020-10-10 ENCOUNTER — Ambulatory Visit (INDEPENDENT_AMBULATORY_CARE_PROVIDER_SITE_OTHER): Payer: Medicare Other | Admitting: Internal Medicine

## 2020-10-10 ENCOUNTER — Encounter: Payer: Self-pay | Admitting: Internal Medicine

## 2020-10-10 ENCOUNTER — Telehealth: Payer: Self-pay | Admitting: Internal Medicine

## 2020-10-10 VITALS — BP 160/74 | HR 84 | Ht 69.5 in | Wt 152.6 lb

## 2020-10-10 DIAGNOSIS — R1110 Vomiting, unspecified: Secondary | ICD-10-CM

## 2020-10-10 DIAGNOSIS — R109 Unspecified abdominal pain: Secondary | ICD-10-CM

## 2020-10-10 DIAGNOSIS — E871 Hypo-osmolality and hyponatremia: Secondary | ICD-10-CM

## 2020-10-10 LAB — BASIC METABOLIC PANEL
BUN: 8 mg/dL (ref 6–23)
CO2: 23 mEq/L (ref 19–32)
Calcium: 9.5 mg/dL (ref 8.4–10.5)
Chloride: 94 mEq/L — ABNORMAL LOW (ref 96–112)
Creatinine, Ser: 1.22 mg/dL (ref 0.40–1.50)
GFR: 59.92 mL/min — ABNORMAL LOW (ref >60.00)
Glucose, Bld: 91 mg/dL (ref 70–99)
Potassium: 4.4 mEq/L (ref 3.5–5.1)
Sodium: 127 mEq/L — ABNORMAL LOW (ref 135–145)

## 2020-10-10 MED ORDER — PANTOPRAZOLE SODIUM 40 MG PO TBEC: 40.0000 mg | DELAYED_RELEASE_TABLET | Freq: Every day | ORAL | 3 refills | Status: DC

## 2020-10-10 NOTE — Telephone Encounter (Signed)
Timothy [name] [facility] called stating that pt cannot have CT over there because they do not part [name] Medicare part A and B sp she was going to cancel appt. Pls call her [name] any questions at [phone].

## 2020-10-10 NOTE — Patient Instructions (Signed)
If you are age 70 or older, your body mass index should be between 23-30. Your Body mass index is 22.21 kg/m. If this is out of the aforementioned range listed, please consider follow up with your Primary Care Provider.  If you are age 38 or younger, your body mass index should be between 19-25. Your Body mass index is 22.21 kg/m. If this is out of the aformentioned range listed, please consider follow up with your Primary Care Provider.   __________________________________________________________  The [facility] providers would like to encourage you to use [facility] to communicate with providers for non-urgent requests or questions.  Due to long hold times on the telephone, sending your provider a message by [facility] may be a faster and more efficient way to get a response.  Please allow 48 business hours for a response.  Please remember that this is for non-urgent requests.   Your provider has requested that you go to the basement level for lab work before leaving today. Press "B" on the elevator. The lab is located at the first door on the left as you exit the elevator.  You have been scheduled for a CT scan of the abdomen and pelvis at [facility] ([address]---this is in the same building as [employer]).   You are scheduled on 10/14/2020 at 1:00pm. You should arrive 15 minutes prior to your appointment time for registration. Please follow the written instructions below on the day of your exam:  WARNING: IF YOU ARE ALLERGIC TO IODINE/X-RAY DYE, PLEASE NOTIFY RADIOLOGY IMMEDIATELY AT [phone]! YOU WILL BE GIVEN A 13 HOUR PREMEDICATION PREP.  1) Do not eat or drink anything after 9:00am (4 hours prior to your test) 2) You have been given 2 bottles of oral contrast to drink. The solution may taste better if refrigerated, but do NOT add ice or any other liquid to this solution. Shake well before drinking.    Drink 1 bottle of contrast @ 111:00am (2 hours prior to  your exam)  Drink 1 bottle of contrast @ 12:00pm (1 hour prior to your exam)  You may take any medications as prescribed with a small amount of water, if necessary. If you take any of the following medications: METFORMIN, GLUCOPHAGE, GLUCOVANCE, AVANDAMET, RIOMET, FORTAMET, Ryegate MET, JANUMET, GLUMETZA or METAGLIP, you MAY be asked to HOLD this medication 48 hours AFTER the exam.  The purpose of you drinking the oral contrast is to aid in the visualization of your intestinal tract. The contrast solution may cause some diarrhea. Depending on your individual set of symptoms, you may also receive an intravenous injection of x-ray contrast/dye. Plan on being at [facility] for 30 minutes or longer, depending on the type of exam you are having performed.  This test typically takes 30-45 minutes to complete.  If you have any questions regarding your exam or if you need to reschedule, you may call the CT department at [phone] between the hours of 8:00 am and 5:00 pm, Monday-Friday.  You have been scheduled for an endoscopy. Please follow written instructions given to you at your visit today. If you use inhalers (even only as needed), please bring them with you on the day of your procedure.  We have sent the following medications to your pharmacy for you to pick up at your convenience:  Pantoprazole

## 2020-10-10 NOTE — Progress Notes (Signed)
HISTORY OF PRESENT ILLNESS:  Timothy [name] is a 70 y.o. male with past medical history as listed below who presents today for evaluation of infrequent episodes of nausea and vomiting.  Patient was evaluated June 09, 2020 regarding the same.  See that dictation for details.  Patient tells me that approximately 1 time per month, with or without meals, he will develop nausea and vomiting.  His last such episode was about 3 weeks ago after having pizza and beer.  He denies any associated abdominal pain prior to, during, or after these episodes.  No change in bowel habits.  No GI bleeding.  No longer on PPI.  He does describe waterbrash prior to these episodes, but no heartburn or indigestion.  His weight has been stable.  He did undergo a cardiac evaluation earlier this year, which he tells me was negative.  Previous abdominal ultrasound May 2021 revealed hepatic steatosis but was otherwise unremarkable.  His last complete colonoscopy was performed December 2019.  He was found to have a diminutive adenoma.  Follow-up in 5 years recommended.  Patient reiterates that he is familiar with his own body and knows that something is not quite right.  REVIEW OF SYSTEMS:  All non-GI ROS negative unless otherwise stated in the HPI.  Past Medical History:  Diagnosis Date   Atypical chest pain 07/13/2019   Bilateral hearing loss 08/24/2019   Cataract    Chronic kidney disease    kidney stones   Chronic pain of left knee 11/28/2019   COLONIC POLYPS, HX OF 02/12/2009   COVID-19 01/2020   Dyslipidemia 11/21/2007   Qualifier: Diagnosis of  By: Burnice Logan  MD, Doretha Sou    Elevated LFTs 09/02/2018   Essential hypertension 04/07/2016   Treatment initiated February 2018   Follow-up examination after eye surgery 07/19/2019   Healthcare maintenance 08/22/2018   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/09/2017   HYPERLIPIDEMIA 11/21/2007   Hypertension    Hypertriglyceridemia 08/15/2019   Left epiretinal membrane  07/19/2019   Need for influenza vaccination 12/22/2017   Need for shingles vaccine 09/02/2018   NEPHROLITHIASIS, HX OF 01/17/2007   OSTEOARTHRITIS 01/17/2007   Osteoarthritis 01/17/2007   Qualifier: Diagnosis of  By: Burnice Logan  MD, Doretha Sou    Posterior vitreous detachment of left eye 07/19/2019   Right epiretinal membrane 07/19/2019   Steatosis of liver 08/24/2019   Subacromial impingement 01/16/2019    Past Surgical History:  Procedure Laterality Date   APPENDECTOMY     CARPAL TUNNEL RELEASE Right    CATARACT EXTRACTION Bilateral    june 2019   HERNIA REPAIR     ingunial   KNEE SURGERY Right    x 4   MENISCUS REPAIR Left    knee; x 3   REPLACEMENT TOTAL KNEE Right    SHOULDER SURGERY     left   TOTAL KNEE ARTHROPLASTY     right    Social History Timothy [name]  reports that he has never smoked. His smokeless tobacco use includes chew. He reports current alcohol use of about 6.0 - 8.0 standard drinks per week. He reports that he does not use drugs.  family history includes Colon cancer (age of onset: 15) in his father.  No Known Allergies     PHYSICAL EXAMINATION: Vital signs: BP (!) 160/74   Pulse 84   Ht 5' 9.5" (1.765 m)   Wt 152 lb 9.6 oz (69.2 kg)   BMI 22.21 kg/m   Constitutional: generally well-appearing,  no acute distress Psychiatric: alert and oriented x3, cooperative Eyes: extraocular movements intact, anicteric, conjunctiva pink Mouth: oral pharynx moist, no lesions Neck: supple no lymphadenopathy Cardiovascular: heart regular rate and rhythm, no murmur Lungs: clear to auscultation bilaterally Abdomen: soft, nontender, nondistended, no obvious ascites, no peritoneal signs, normal bowel sounds, no organomegaly Rectal: Omitted Extremities: no clubbing, cyanosis, or lower extremity edema bilaterally Skin: no lesions on visible extremities Neuro: No focal deficits.  Cranial nerves intact  ASSESSMENT:  1.  Episodic nausea with vomiting without  other associated features, approximately once per month.  Etiology unclear 2.  History of adenomatous and sessile serrated polyps.  Surveillance up-to-date.  Surveillance colonoscopy will be due December 2024   PLAN:  1.  Prescribe pantoprazole 40 mg daily 2.  Schedule upper endoscopy.The nature of the procedure, as well as the risks, benefits, and alternatives were carefully and thoroughly reviewed with the patient. Ample time for discussion and questions allowed. The patient understood, was satisfied, and agreed to proceed.  3.  Schedule CT scan of the abdomen and pelvis. 4.  Basic metabolic panel 5.  Plans and follow-up to be determined after the above completed  ADDENDUM: The patient is sodium returned significantly low at 127.  He was notified.  Told to be in contact with his PCP ASAP.  His PCP was in the note as well.  Given this, other considerations may be for imaging of the brain.  We will see what his PCP determines.  A total time of 40 minutes was spent preparing to see the patient, reviewing tests, obtaining comprehensive history, performing comprehensive physical examination, counseling the patient regarding his above listed issues, ordering medications, endoscopic procedure, blood work, and advanced imaging.  Finally, documenting clinical information in the health record.

## 2020-10-13 NOTE — Telephone Encounter (Signed)
Rescheduled CT at Timothy [name].  Spoke to patient and told him I would mychart him the details.  Patient agreed.

## 2020-10-14 ENCOUNTER — Other Ambulatory Visit: Payer: Medicare Other

## 2020-10-16 ENCOUNTER — Ambulatory Visit (INDEPENDENT_AMBULATORY_CARE_PROVIDER_SITE_OTHER): Payer: Medicare Other | Admitting: Family Medicine

## 2020-10-16 ENCOUNTER — Other Ambulatory Visit: Payer: Self-pay

## 2020-10-16 ENCOUNTER — Encounter: Payer: Self-pay | Admitting: Family Medicine

## 2020-10-16 VITALS — BP 126/78 | HR 93 | Temp 97.0°F | Ht 69.0 in | Wt 151.4 lb

## 2020-10-16 DIAGNOSIS — D61818 Other pancytopenia: Secondary | ICD-10-CM | POA: Diagnosis not present

## 2020-10-16 DIAGNOSIS — I10 Essential (primary) hypertension: Secondary | ICD-10-CM | POA: Diagnosis not present

## 2020-10-16 DIAGNOSIS — R7989 Other specified abnormal findings of blood chemistry: Secondary | ICD-10-CM | POA: Diagnosis not present

## 2020-10-16 DIAGNOSIS — E871 Hypo-osmolality and hyponatremia: Secondary | ICD-10-CM

## 2020-10-16 LAB — COMPREHENSIVE METABOLIC PANEL
ALT: 69 U/L — ABNORMAL HIGH (ref 0–53)
AST: 100 U/L — ABNORMAL HIGH (ref 0–37)
Albumin: 4 g/dL (ref 3.5–5.2)
Alkaline Phosphatase: 117 U/L (ref 39–117)
BUN: 7 mg/dL (ref 6–23)
CO2: 27 mEq/L (ref 19–32)
Calcium: 9.6 mg/dL (ref 8.4–10.5)
Chloride: 98 mEq/L (ref 96–112)
Creatinine, Ser: 0.92 mg/dL (ref 0.40–1.50)
GFR: 84.07 mL/min (ref >60.00)
Glucose, Bld: 105 mg/dL — ABNORMAL HIGH (ref 70–99)
Potassium: 4.4 mEq/L (ref 3.5–5.1)
Sodium: 133 mEq/L — ABNORMAL LOW (ref 135–145)
Total Bilirubin: 0.8 mg/dL (ref 0.2–1.2)
Total Protein: 7.2 g/dL (ref 6.0–8.3)

## 2020-10-16 LAB — CBC
HCT: 36.6 % — ABNORMAL LOW (ref 39.0–52.0)
Hemoglobin: 12.7 g/dL — ABNORMAL LOW (ref 13.0–17.0)
MCHC: 34.7 g/dL (ref 30.0–36.0)
MCV: 100.2 fl — ABNORMAL HIGH (ref 78.0–100.0)
Platelets: 99 10*3/uL — ABNORMAL LOW (ref 150.0–400.0)
RBC: 3.65 Mil/uL — ABNORMAL LOW (ref 4.22–5.81)
RDW: 13.1 % (ref 11.5–15.5)
WBC: 2.8 10*3/uL — ABNORMAL LOW (ref 4.0–10.5)

## 2020-10-16 LAB — URINALYSIS, ROUTINE W REFLEX MICROSCOPIC
Bilirubin Urine: NEGATIVE
Hgb urine dipstick: NEGATIVE
Ketones, ur: NEGATIVE
Leukocytes,Ua: NEGATIVE
Nitrite: NEGATIVE
RBC / HPF: NONE SEEN (ref 0–?)
Specific Gravity, Urine: <=1.005 — AB (ref 1.000–1.030)
Total Protein, Urine: NEGATIVE
Urine Glucose: NEGATIVE
Urobilinogen, UA: 0.2 (ref 0.0–1.0)
WBC, UA: NONE SEEN (ref 0–?)
pH: 6 (ref 5.0–8.0)

## 2020-10-16 NOTE — Progress Notes (Addendum)
Established Patient Office Visit  Subjective:  Patient ID: Timothy [name], male    DOB: 12/04/1949  Age: 70 y.o. MRN: [id number]  CC:  Chief Complaint  Patient presents with   Follow-up    Follow up on low sodium levels. No concerns.     HPI Timothy [name] presents for follow-up of a recent BMP revealed hypokalemia natremia and hyperchloremia.  Patient feels great.  Denies headache, malaise, nausea or vomiting, myalgias or arthralgias.  Continues to workout regularly.  Assures me that he drinks no more than 2 beers in a given setting.  He does have a relatively recent decline in GFR over the last few years.  Has been on lisinopril for years without issue.  Drinks plenty of water and ice tea.  Past Medical History:  Diagnosis Date   Atypical chest pain 07/13/2019   Bilateral hearing loss 08/24/2019   Cataract    Chronic kidney disease    kidney stones   Chronic pain of left knee 11/28/2019   COLONIC POLYPS, HX OF 02/12/2009   COVID-19 01/2020   Dyslipidemia 11/21/2007   Qualifier: Diagnosis of  By: Burnice Logan  MD, Doretha Sou    Elevated LFTs 09/02/2018   Essential hypertension 04/07/2016   Treatment initiated February 2018   Follow-up examination after eye surgery 07/19/2019   Healthcare maintenance 08/22/2018   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/09/2017   HYPERLIPIDEMIA 11/21/2007   Hypertension    Hypertriglyceridemia 08/15/2019   Left epiretinal membrane 07/19/2019   Need for influenza vaccination 12/22/2017   Need for shingles vaccine 09/02/2018   NEPHROLITHIASIS, HX OF 01/17/2007   OSTEOARTHRITIS 01/17/2007   Osteoarthritis 01/17/2007   Qualifier: Diagnosis of  By: Burnice Logan  MD, Doretha Sou    Posterior vitreous detachment of left eye 07/19/2019   Right epiretinal membrane 07/19/2019   Steatosis of liver 08/24/2019   Subacromial impingement 01/16/2019    Past Surgical History:  Procedure Laterality Date   APPENDECTOMY     CARPAL TUNNEL RELEASE Right    CATARACT  EXTRACTION Bilateral    june 2019   HERNIA REPAIR     ingunial   KNEE SURGERY Right    x 4   MENISCUS REPAIR Left    knee; x 3   REPLACEMENT TOTAL KNEE Right    SHOULDER SURGERY     left   TOTAL KNEE ARTHROPLASTY     right    Family History  Problem Relation Age of Onset   Colon cancer Father 33   Esophageal cancer Neg Hx    Rectal cancer Neg Hx    Stomach cancer Neg Hx    Pancreatic cancer Neg Hx    Liver disease Neg Hx     Social History   Socioeconomic History   Marital status: Married    Spouse name: Not on file   Number of children: Not on file   Years of education: Not on file   Highest education level: Not on file  Occupational History   Not on file  Tobacco Use   Smoking status: Never   Smokeless tobacco: Current    Types: Chew  Vaping Use   Vaping Use: Never used  Substance and Sexual Activity   Alcohol use: Yes    Alcohol/week: 6.0 - 8.0 standard drinks    Types: 6 - 8 Cans of beer per week   Drug use: No   Sexual activity: Not Currently  Other Topics Concern   Not on file  Social  History Narrative   Not on file   Social Determinants of Health   Financial Resource Strain: Low Risk    Difficulty of Paying Living Expenses: Not hard at all  Food Insecurity: No Food Insecurity   Worried About Charity fundraiser in the Last Year: Never true   [place] in the Last Year: Never true  Transportation Needs: No Transportation Needs   Lack of Transportation (Medical): No   Lack of Transportation (Non-Medical): No  Physical Activity: Sufficiently Active   Days of Exercise per Week: 5 days   Minutes of Exercise per Session: 40 min  Stress: No Stress Concern Present   Feeling of Stress : Not at all  Social Connections: Socially Integrated   Frequency of Communication with Friends and Family: More than three times a week   Frequency of Social Gatherings with Friends and Family: More than three times a week   Attends Religious Services: More than  4 times per year   Active Member of [employer] or Organizations: Yes   Attends Music therapist: More than 4 times per year   Marital Status: Married  Human resources officer Violence: Not At Risk   Fear of Current or Ex-Partner: No   Emotionally Abused: No   Physically Abused: No   Sexually Abused: No    Outpatient Medications Prior to Visit  Medication Sig Dispense Refill   lisinopril (ZESTRIL) 20 MG tablet Take 1 tablet (20 mg total) by mouth daily. Take 1 tablet (20 mg total) by mouth daily. **Needs appt before anymore refills given** 30 tablet 0   Multiple Vitamin (MULTIVITAMIN) tablet Take 1 tablet by mouth daily.     pantoprazole (PROTONIX) 40 MG tablet Take 1 tablet (40 mg total) by mouth daily. 90 tablet 3   No facility-administered medications prior to visit.    No Known Allergies  ROS Review of Systems  Constitutional:  Negative for chills, diaphoresis, fatigue, fever and unexpected weight change.  HENT: Negative.    Eyes:  Negative for photophobia and visual disturbance.  Respiratory:  Negative for chest tightness, shortness of breath and wheezing.   Cardiovascular:  Negative for chest pain.  Gastrointestinal:  Negative for abdominal pain, nausea and vomiting.  Endocrine: Negative for polyphagia.  Genitourinary:  Negative for decreased urine volume and difficulty urinating.  Musculoskeletal:  Negative for arthralgias and myalgias.  Skin:  Negative for color change and pallor.  Neurological:  Negative for weakness and headaches.  Psychiatric/Behavioral: Negative.       Objective:    Physical Exam Vitals and nursing note (appear euvolemic) reviewed.  Constitutional:      General: He is not in acute distress.    Appearance: Normal appearance. He is normal weight. He is not ill-appearing, toxic-appearing or diaphoretic.  HENT:     Head: Normocephalic and atraumatic.     Right Ear: Tympanic membrane, ear canal and external ear normal.     Left Ear: Tympanic  membrane, ear canal and external ear normal.     Mouth/Throat:     Mouth: Mucous membranes are moist.     Pharynx: Oropharynx is clear. No oropharyngeal exudate or posterior oropharyngeal erythema.  Eyes:     General: No scleral icterus.       Right eye: No discharge.        Left eye: No discharge.     Extraocular Movements: Extraocular movements intact.     Conjunctiva/sclera: Conjunctivae normal.     Pupils: Pupils are equal,  round, and reactive to light.  Neck:     Vascular: No carotid bruit.  Cardiovascular:     Rate and Rhythm: Normal rate and regular rhythm.  Pulmonary:     Effort: Pulmonary effort is normal.     Breath sounds: Normal breath sounds.  Abdominal:     General: Bowel sounds are normal.  Musculoskeletal:     Cervical back: No rigidity or tenderness.     Right lower leg: No edema.     Left lower leg: No edema.  Lymphadenopathy:     Cervical: No cervical adenopathy.  Neurological:     Mental Status: He is alert and oriented to person, place, and time.  Psychiatric:        Mood and Affect: Mood normal.        Behavior: Behavior normal.    BP 126/78 (BP Location: Left Arm, Patient Position: Sitting, Cuff Size: Normal)   Pulse 93   Temp (!) 97 F (36.1 C) (Temporal)   Ht '5\' 9"'$  (1.753 m)   Wt 151 lb 6.4 oz (68.7 kg)   SpO2 98%   BMI 22.36 kg/m  Wt Readings from Last 3 Encounters:  10/16/20 151 lb 6.4 oz (68.7 kg)  10/10/20 152 lb 9.6 oz (69.2 kg)  06/09/20 153 lb 3.2 oz (69.5 kg)     Health Maintenance Due  Topic Date Due   INFLUENZA VACCINE  09/29/2020    There are no preventive care reminders to display for this patient.  Lab Results  Component Value Date   TSH 0.81 07/14/2017   Lab Results  Component Value Date   WBC 2.8 (L) 10/16/2020   HGB 12.7 (L) 10/16/2020   HCT 36.6 (L) 10/16/2020   MCV 100.2 (H) 10/16/2020   PLT 99.0 (L) 10/16/2020   Lab Results  Component Value Date   NA 133 (L) 10/16/2020   K 4.4 10/16/2020   CO2 27  10/16/2020   GLUCOSE 105 (H) 10/16/2020   BUN 7 10/16/2020   CREATININE 0.92 10/16/2020   BILITOT 0.8 10/16/2020   ALKPHOS 117 10/16/2020   AST 100 (H) 10/16/2020   ALT 69 (H) 10/16/2020   PROT 7.2 10/16/2020   ALBUMIN 4.0 10/16/2020   CALCIUM 9.6 10/16/2020   GFR 84.07 10/16/2020   Lab Results  Component Value Date   CHOL 384 (H) 07/11/2019   Lab Results  Component Value Date   HDL 30.00 (L) 07/11/2019   Lab Results  Component Value Date   LDLCALC 61 08/22/2018   Lab Results  Component Value Date   TRIG (H) 07/11/2019    947.0 Triglyceride is over 400; calculations on Lipids are invalid.   Lab Results  Component Value Date   CHOLHDL 13 07/11/2019   No results found for: HGBA1C    Assessment & Plan:   Problem List Items Addressed This Visit       Cardiovascular and Mediastinum   Essential hypertension - Primary   Relevant Orders   CBC (Completed)   Comprehensive metabolic panel (Completed)   Urinalysis, Routine w reflex microscopic (Completed)     Hematopoietic and Hemostatic   Pancytopenia (HCC)   Relevant Orders   TSH   Lactate dehydrogenase   CBC w/Diff   Retic     Other   Elevated LFTs   Hyponatremia   Relevant Orders   Comprehensive metabolic panel (Completed)   Urinalysis, Routine w reflex microscopic (Completed)   Sodium, urine, random (Completed)   Osmolality   Arginine vasopressin  hormone   Cortisol   TSH    No orders of the defined types were placed in this encounter.   Follow-up: Return in about 3 months (around 01/16/2021), or Return for physical..  Have spent over 50 minutes in chart review and researching the above problems for this patient.  Libby Maw, MD

## 2020-10-17 ENCOUNTER — Encounter (HOSPITAL_COMMUNITY): Payer: Self-pay

## 2020-10-17 ENCOUNTER — Ambulatory Visit (HOSPITAL_COMMUNITY)
Admission: RE | Admit: 2020-10-17 | Discharge: 2020-10-17 | Disposition: A | Discharge status: Home / Self Care | Payer: Medicare Other | Source: Ambulatory Visit | Attending: Internal Medicine | Admitting: Internal Medicine

## 2020-10-17 DIAGNOSIS — R109 Unspecified abdominal pain: Secondary | ICD-10-CM

## 2020-10-17 DIAGNOSIS — D61818 Other pancytopenia: Secondary | ICD-10-CM | POA: Insufficient documentation

## 2020-10-17 DIAGNOSIS — K760 Fatty (change of) liver, not elsewhere classified: Secondary | ICD-10-CM | POA: Diagnosis not present

## 2020-10-17 DIAGNOSIS — E871 Hypo-osmolality and hyponatremia: Secondary | ICD-10-CM | POA: Insufficient documentation

## 2020-10-17 DIAGNOSIS — M25561 Pain in right knee: Secondary | ICD-10-CM | POA: Diagnosis not present

## 2020-10-17 MED ORDER — IOHEXOL 350 MG/ML SOLN
75.0000 mL | Freq: Once | INTRAVENOUS | Status: AC | PRN
  Administered 2020-10-17: 75 mL via INTRAVENOUS

## 2020-10-17 NOTE — Addendum Note (Signed)
Addended by: Jon Billings on: 10/17/2020 08:18 AM   Modules accepted: Orders, Level of Service

## 2020-10-17 NOTE — Progress Notes (Signed)
1. Sodium has increased but is still low. Have ordered more blood work. Please return at your earliest convenience in the morning please. 2. Blood count throughout the cell lines is depressed. Have ordered additional blood work. 3. Liver enzymes are elevated. I see that Dr. Henrene Pastor has ordered a CT scan. That is good.

## 2020-10-18 LAB — SODIUM, URINE, RANDOM: Sodium, Ur: 27 mmol/L — ABNORMAL LOW (ref 28–272)

## 2020-10-18 LAB — OSMOLALITY: Osmolality: 294 mOsm/kg (ref 278–305)

## 2020-10-21 ENCOUNTER — Other Ambulatory Visit: Payer: Self-pay

## 2020-10-21 DIAGNOSIS — R1110 Vomiting, unspecified: Secondary | ICD-10-CM

## 2020-10-21 DIAGNOSIS — E871 Hypo-osmolality and hyponatremia: Secondary | ICD-10-CM

## 2020-10-21 DIAGNOSIS — R519 Headache, unspecified: Secondary | ICD-10-CM

## 2020-10-21 DIAGNOSIS — R109 Unspecified abdominal pain: Secondary | ICD-10-CM

## 2020-10-29 ENCOUNTER — Ambulatory Visit (HOSPITAL_COMMUNITY): Payer: Medicare Other

## 2020-10-29 ENCOUNTER — Other Ambulatory Visit: Payer: Self-pay

## 2020-10-29 DIAGNOSIS — I10 Essential (primary) hypertension: Secondary | ICD-10-CM

## 2020-10-29 MED ORDER — LISINOPRIL 20 MG PO TABS: 20.0000 mg | ORAL_TABLET | Freq: Every day | ORAL | 0 refills | Status: DC

## 2020-10-30 DIAGNOSIS — Z96651 Presence of right artificial knee joint: Secondary | ICD-10-CM | POA: Diagnosis not present

## 2020-11-06 ENCOUNTER — Telehealth: Payer: Self-pay | Admitting: Internal Medicine

## 2020-11-06 NOTE — Telephone Encounter (Signed)
Pt's wife called Renee inquiring why pt needs a CT scan of his head.  She stated that nobody has explained that to pt. Her phone is [phone].

## 2020-11-06 NOTE — Telephone Encounter (Signed)
Pt was giving Dr. Henrene Pastor recommendations for the CT of the head. Pt verbalized understanding with all questions answered. Pt was given the xray scheduling number [phone] to call and reschedule when he is back in town

## 2020-11-12 ENCOUNTER — Ambulatory Visit (INDEPENDENT_AMBULATORY_CARE_PROVIDER_SITE_OTHER): Payer: Medicare Other | Admitting: Cardiology

## 2020-11-12 ENCOUNTER — Other Ambulatory Visit: Payer: Self-pay

## 2020-11-12 ENCOUNTER — Encounter: Payer: Self-pay | Admitting: Cardiology

## 2020-11-12 VITALS — BP 146/82 | HR 86 | Ht 70.0 in | Wt 147.0 lb

## 2020-11-12 DIAGNOSIS — R0789 Other chest pain: Secondary | ICD-10-CM

## 2020-11-12 DIAGNOSIS — I10 Essential (primary) hypertension: Secondary | ICD-10-CM | POA: Diagnosis not present

## 2020-11-12 DIAGNOSIS — Z860100 Personal history of colon polyps, unspecified: Secondary | ICD-10-CM

## 2020-11-12 DIAGNOSIS — E871 Hypo-osmolality and hyponatremia: Secondary | ICD-10-CM | POA: Diagnosis not present

## 2020-11-12 NOTE — Patient Instructions (Signed)

## 2020-11-12 NOTE — Progress Notes (Signed)
Cardiology Office Note:    Date:  11/12/2020   ID:  Timothy [name], DOB 12/04/1949, MRN [id number]  PCP:  Libby Maw, MD  Cardiologist:  [name], MD    Referring MD: Libby Maw,*   Chief Complaint  Patient presents with   Follow-up    History of Present Illness:    Timothy [name] is a 70 y.o. male who is referred originally to [place] because of atypical chest pain.  We did stress test which was negative.  After that he was put on proton pump inhibitor with almost complete resolution of the symptoms.  Interestingly later he started having chest pain every single time after he got COVID-19 vaccination.  Recently however a lot of additional problems occur he was found to be severely hyponatremic, he also was found to have a blood in the stool, he does have some strange abdominal symptoms he said sometimes when he eats a little later he will vomit he is scheduled to have a gastroscopy and colonoscopy done.  He does have family history of colon cancer.  He did lost some weight recently.  Cardiac wise seems to be doing well.  There is no chest pain tightness squeezing pressure mid chest.  He described to have some heartburn sometimes after he eats  Past Medical History:  Diagnosis Date   Atypical chest pain 07/13/2019   Bilateral hearing loss 08/24/2019   Cataract    Chronic kidney disease    kidney stones   Chronic pain of left knee 11/28/2019   COLONIC POLYPS, HX OF 02/12/2009   COVID-19 01/2020   Dyslipidemia 11/21/2007   Qualifier: Diagnosis of  By: Burnice Logan  MD, Doretha Sou    Elevated LFTs 09/02/2018   Essential hypertension 04/07/2016   Treatment initiated February 2018   Follow-up examination after eye surgery 07/19/2019   Healthcare maintenance 08/22/2018   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/09/2017   HYPERLIPIDEMIA 11/21/2007   Hypertension    Hypertriglyceridemia 08/15/2019   Left epiretinal membrane 07/19/2019   Need for influenza  vaccination 12/22/2017   Need for shingles vaccine 09/02/2018   NEPHROLITHIASIS, HX OF 01/17/2007   OSTEOARTHRITIS 01/17/2007   Osteoarthritis 01/17/2007   Qualifier: Diagnosis of  By: Burnice Logan  MD, Doretha Sou    Posterior vitreous detachment of left eye 07/19/2019   Right epiretinal membrane 07/19/2019   Steatosis of liver 08/24/2019   Subacromial impingement 01/16/2019    Past Surgical History:  Procedure Laterality Date   APPENDECTOMY     CARPAL TUNNEL RELEASE Right    CATARACT EXTRACTION Bilateral    june 2019   HERNIA REPAIR     ingunial   KNEE SURGERY Right    x 4   MENISCUS REPAIR Left    knee; x 3   REPLACEMENT TOTAL KNEE Right    SHOULDER SURGERY     left   TOTAL KNEE ARTHROPLASTY     right    Current Medications: Current Meds  Medication Sig   lisinopril (ZESTRIL) 20 MG tablet Take 1 tablet (20 mg total) by mouth daily. Take 1 tablet (20 mg total) by mouth daily. **Needs appt before anymore refills given**   Multiple Vitamin (MULTIVITAMIN) tablet Take 1 tablet by mouth daily. Unknown strength   pantoprazole (PROTONIX) 40 MG tablet Take 1 tablet (40 mg total) by mouth daily.     Allergies:   Patient has no known allergies.   Social History   Socioeconomic History   Marital status: Married  Spouse name: Not on file   Number of children: Not on file   Years of education: Not on file   Highest education level: Not on file  Occupational History   Not on file  Tobacco Use   Smoking status: Never   Smokeless tobacco: Current    Types: Chew  Vaping Use   Vaping Use: Never used  Substance and Sexual Activity   Alcohol use: Yes    Alcohol/week: 6.0 - 8.0 standard drinks    Types: 6 - 8 Cans of beer per week   Drug use: No   Sexual activity: Not Currently  Other Topics Concern   Not on file  Social History Narrative   Not on file   Social Determinants of Health   Financial Resource Strain: Low Risk    Difficulty of Paying Living Expenses: Not hard at  all  Food Insecurity: No Food Insecurity   Worried About Charity fundraiser in the Last Year: Never true   Ran Out of Food in the Last Year: Never true  Transportation Needs: No Transportation Needs   Lack of Transportation (Medical): No   Lack of Transportation (Non-Medical): No  Physical Activity: Sufficiently Active   Days of Exercise per Week: 5 days   Minutes of Exercise per Session: 40 min  Stress: No Stress Concern Present   Feeling of Stress : Not at all  Social Connections: Socially Integrated   Frequency of Communication with Friends and Family: More than three times a week   Frequency of Social Gatherings with Friends and Family: More than three times a week   Attends Religious Services: More than 4 times per year   Active Member of [employer] or Organizations: Yes   Attends Music therapist: More than 4 times per year   Marital Status: Married     Family History: The patient's family history includes Colon cancer (age of onset: 3) in his father. There is no history of Esophageal cancer, Rectal cancer, Stomach cancer, Pancreatic cancer, or Liver disease. ROS:   Please see the history of present illness.    All 14 point review of systems negative except as described per history of present illness  EKGs/Labs/Other Studies Reviewed:      Recent Labs: 10/16/2020: ALT 69; BUN 7; Creatinine, Ser 0.92; Hemoglobin 12.7; Platelets 99.0; Potassium 4.4; Sodium 133  Recent Lipid Panel    Component Value Date/Time   CHOL 384 (H) 07/11/2019 0829   TRIG (H) 07/11/2019 0829    947.0 Triglyceride is over 400; calculations on Lipids are invalid.   HDL 30.00 (L) 07/11/2019 0829   CHOLHDL 13 07/11/2019 0829   VLDL 18.0 07/14/2017 1112   LDLCALC 61 08/22/2018 1109   LDLDIRECT 75.0 07/11/2019 0829    Physical Exam:    VS:  BP (!) 146/82 (BP Location: Right Arm, Patient Position: Sitting)   Pulse 86   Ht '5\' 10"'$  (1.778 m)   Wt 147 lb (66.7 kg)   SpO2 97%   BMI 21.09  kg/m     Wt Readings from Last 3 Encounters:  11/12/20 147 lb (66.7 kg)  10/16/20 151 lb 6.4 oz (68.7 kg)  10/10/20 152 lb 9.6 oz (69.2 kg)     GEN:  Well nourished, well developed in no acute distress HEENT: Normal NECK: No JVD; No carotid bruits LYMPHATICS: No lymphadenopathy CARDIAC: RRR, no murmurs, no rubs, no gallops RESPIRATORY:  Clear to auscultation without rales, wheezing or rhonchi  ABDOMEN: Soft, non-tender, non-distended  MUSCULOSKELETAL:  No edema; No deformity  SKIN: Warm and dry LOWER EXTREMITIES: no swelling NEUROLOGIC:  Alert and oriented x 3 PSYCHIATRIC:  Normal affect   ASSESSMENT:    1. Essential hypertension   2. Atypical chest pain   3. COLONIC POLYPS, HX OF   4. Hyponatremia    PLAN:    In order of problems listed above:  Essential hypertension blood pressure slightly elevated but he is excited about everything what he talks today I will not act on that right now but will follow-up. Atypical chest pain stress test negative for about 2 years ago.  His symptoms do not suggest coronary artery disease however after admit his symptoms are very nonspecific.  I will wait for GI work-up to be completed if GI work-up is not revealing we may consider doing some additional cardiac testing. Dyslipidemia did review his K PN which show LDL 61 HDL 30 he is not taking any cholesterol medication.  We will continue monitoring. Hyponatremia: Followed by internal medicine team Colon polyps family history of as well.  Family history of colon cancer.  Gastroscopy and colonoscopy scheduled.   Medication Adjustments/Labs and Tests Ordered: Current medicines are reviewed at length with the patient today.  Concerns regarding medicines are outlined above.  No orders of the defined types were placed in this encounter.  Medication changes: No orders of the defined types were placed in this encounter.   Signed, Park Liter, MD, [facility] 11/12/2020 8:45 AM    [place]

## 2020-11-18 ENCOUNTER — Ambulatory Visit (INDEPENDENT_AMBULATORY_CARE_PROVIDER_SITE_OTHER): Payer: Medicare Other | Admitting: Ophthalmology

## 2020-11-18 ENCOUNTER — Other Ambulatory Visit: Payer: Self-pay

## 2020-11-18 ENCOUNTER — Encounter (INDEPENDENT_AMBULATORY_CARE_PROVIDER_SITE_OTHER): Payer: Self-pay | Admitting: Ophthalmology

## 2020-11-18 ENCOUNTER — Encounter (INDEPENDENT_AMBULATORY_CARE_PROVIDER_SITE_OTHER): Payer: Medicare Other | Admitting: Ophthalmology

## 2020-11-18 DIAGNOSIS — H35371 Puckering of macula, right eye: Secondary | ICD-10-CM | POA: Diagnosis not present

## 2020-11-18 DIAGNOSIS — H35372 Puckering of macula, left eye: Secondary | ICD-10-CM | POA: Diagnosis not present

## 2020-11-18 DIAGNOSIS — Z961 Presence of intraocular lens: Secondary | ICD-10-CM | POA: Diagnosis not present

## 2020-11-18 NOTE — Assessment & Plan Note (Signed)
Epiretinal membrane left eye with no interval change of the last 12 months with thickening of the left eye with good acuity preserved.  We will continue to observe

## 2020-11-18 NOTE — Assessment & Plan Note (Signed)
Multifocal lenses, PC open cleared stable

## 2020-11-18 NOTE — Progress Notes (Signed)
11/18/2020     CHIEF COMPLAINT Patient presents for  Chief Complaint  Patient presents with   Retina Follow Up      HISTORY OF PRESENT ILLNESS: Timothy [name] is a 70 y.o. male who presents to the clinic today for:   HPI     Retina Follow Up   Patient presents with  Other.  In both eyes.  This started 1 year ago.  Duration of 1 year.        Comments   1 year f/u OU with OCT  Pt c/o worsening distance and near vision in the left eye since previous visit, progressive over the past year. Pt c/o singular floater in the right eye, comes and goes, present for the past year. Pt denies any flashes of light. Pt denies any eye pain.  Eye Meds: None      Last edited by Reather Littler, COA on 11/18/2020  8:12 AM.      Referring physician: Libby Maw, MD [place],  [place] [address]  HISTORICAL INFORMATION:   Selected notes from the [facility]: No current outpatient medications on file. (Ophthalmic Drugs)   No current facility-administered medications for this visit. (Ophthalmic Drugs)   Current Outpatient Medications (Other)  Medication Sig   lisinopril (ZESTRIL) 20 MG tablet Take 1 tablet (20 mg total) by mouth daily. Take 1 tablet (20 mg total) by mouth daily. **Needs appt before anymore refills given**   Multiple Vitamin (MULTIVITAMIN) tablet Take 1 tablet by mouth daily. Unknown strength   pantoprazole (PROTONIX) 40 MG tablet Take 1 tablet (40 mg total) by mouth daily.   No current facility-administered medications for this visit. (Other)      REVIEW OF SYSTEMS:    ALLERGIES No Known Allergies  PAST MEDICAL HISTORY Past Medical History:  Diagnosis Date   Atypical chest pain 07/13/2019   Bilateral hearing loss 08/24/2019   Cataract    Cataract    Chronic kidney disease    kidney stones   Chronic pain of left knee 11/28/2019   COLONIC POLYPS, HX OF 02/12/2009   COVID-19  01/2020   Dyslipidemia 11/21/2007   Qualifier: Diagnosis of  By: Burnice Logan  MD, Doretha Sou    Elevated LFTs 09/02/2018   Essential hypertension 04/07/2016   Treatment initiated February 2018   Follow-up examination after eye surgery 07/19/2019   Healthcare maintenance 08/22/2018   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/09/2017   HYPERLIPIDEMIA 11/21/2007   Hypertension    Hypertriglyceridemia 08/15/2019   Left epiretinal membrane 07/19/2019   Need for influenza vaccination 12/22/2017   Need for shingles vaccine 09/02/2018   NEPHROLITHIASIS, HX OF 01/17/2007   OSTEOARTHRITIS 01/17/2007   Osteoarthritis 01/17/2007   Qualifier: Diagnosis of  By: Burnice Logan  MD, Doretha Sou    Posterior vitreous detachment of left eye 07/19/2019   Right epiretinal membrane 07/19/2019   Steatosis of liver 08/24/2019   Subacromial impingement 01/16/2019   Past Surgical History:  Procedure Laterality Date   APPENDECTOMY     CARPAL TUNNEL RELEASE Right    CATARACT EXTRACTION Bilateral    june 2019   HERNIA REPAIR     ingunial   KNEE SURGERY Right    x 4   MENISCUS REPAIR Left    knee; x 3   REPLACEMENT TOTAL KNEE Right    SHOULDER SURGERY     left   TOTAL KNEE ARTHROPLASTY  right    FAMILY HISTORY Family History  Problem Relation Age of Onset   Colon cancer Father 86   Esophageal cancer Neg Hx    Rectal cancer Neg Hx    Stomach cancer Neg Hx    Pancreatic cancer Neg Hx    Liver disease Neg Hx     SOCIAL HISTORY Social History   Tobacco Use   Smoking status: Never   Smokeless tobacco: Current    Types: Chew  Vaping Use   Vaping Use: Never used  Substance Use Topics   Alcohol use: Yes    Alcohol/week: 6.0 - 8.0 standard drinks    Types: 6 - 8 Cans of beer per week   Drug use: No         OPHTHALMIC EXAM:  Base Eye Exam     Visual Acuity (ETDRS)       Right Left   Dist [place] 20/20 20/20         Tonometry (Tonopen, 8:17 AM)       Right Left   Pressure 18 16         Pupils        Pupils Dark Light Shape React APD   Right PERRL 4 3 Round Brisk None   Left PERRL 4 3 Round Brisk None         Visual Fields (Counting fingers)       Left Right    Full Full         Extraocular Movement       Right Left    Full, Ortho Full, Ortho         Neuro/Psych     Oriented x3: Yes   Mood/Affect: Normal           Slit Lamp and Fundus Exam     External Exam       Right Left   External Normal Normal         Slit Lamp Exam       Right Left   Lids/Lashes Normal Normal   Conjunctiva/Sclera White and quiet White and quiet   Cornea Clear Clear   Anterior Chamber Deep and quiet Deep and quiet   Iris Round and reactive Round and reactive   Lens Posterior chamber intraocular lens, Open posterior capsule Posterior chamber intraocular lens, Open posterior capsule   Anterior Vitreous Normal Normal         Fundus Exam       Right Left   Posterior Vitreous Vitrectomized Posterior vitreous detachment, Central vitreous floaters   Disc Normal Normal   C/D Ratio 0.0 0.1   Macula No topo distortion Epiretinal membrane   Vessels Normal Normal   Periphery Normal Normal            IMAGING AND PROCEDURES  Imaging and Procedures for 11/18/20  OCT, Retina - OU - Both Eyes       Right Eye Quality was good. Scan locations included subfoveal. Central Foveal Thickness: 350. Progression has improved.   Left Eye Quality was good. Scan locations included subfoveal. Central Foveal Thickness: 364. Progression has been stable. Findings include epiretinal membrane, retinal drusen .   Notes OD vastly improved compared to preoperative May 08, 2019.  OS with minor retinal thickening from epiretinal membrane., retinal drusen, no change over the last 12 months             ASSESSMENT/PLAN:  Left epiretinal membrane Epiretinal membrane left eye with no interval change of the  last 12 months with thickening of the left eye with good acuity preserved.   We will continue to observe  Pseudophakia, both eyes Multifocal lenses, PC open cleared stable     ICD-10-CM   1. Right epiretinal membrane  H35.371 OCT, Retina - OU - Both Eyes    2. Left epiretinal membrane  H35.372     3. Pseudophakia, both eyes  Z96.1       1.  OD looks great post vitrectomy membrane peel some 1.5 years previous.  No change in quality of vision  2.  OS with stable acuity, minor epiretinal membrane with no interval change in retinal thickening or acuity by clinical exam or OCT will observe  3.  Ophthalmic Meds Ordered this visit:  No orders of the defined types were placed in this encounter.      No follow-ups on file.  There are no Patient Instructions on file for this visit.   Explained the diagnoses, plan, and follow up with the patient and they expressed understanding.  Patient expressed understanding of the importance of proper follow up care.   Clent Demark Nickoli Bagheri M.D. Diseases & Surgery of the Retina and Vitreous [facility]ina [facility] 11/18/20     Abbreviations: M myopia (nearsighted); A astigmatism; H hyperopia (farsighted); P presbyopia; Mrx spectacle prescription;  CTL contact lenses; OD right eye; OS left eye; OU both eyes  XT exotropia; ET esotropia; PEK punctate epithelial keratitis; PEE punctate epithelial erosions; DES dry eye syndrome; MGD meibomian gland dysfunction; ATs artificial tears; PFAT's preservative free artificial tears; [place] nuclear sclerotic cataract; PSC posterior subcapsular cataract; ERM epi-retinal membrane; PVD posterior vitreous detachment; RD retinal detachment; DM diabetes mellitus; DR diabetic retinopathy; NPDR non-proliferative diabetic retinopathy; PDR proliferative diabetic retinopathy; CSME clinically significant macular edema; DME diabetic macular edema; dbh dot blot hemorrhages; CWS cotton wool spot; POAG primary open angle glaucoma; C/D cup-to-disc ratio; HVF humphrey visual field; GVF goldmann visual field;  OCT optical coherence tomography; IOP intraocular pressure; BRVO Branch retinal vein occlusion; CRVO central retinal vein occlusion; CRAO central retinal artery occlusion; BRAO branch retinal artery occlusion; RT retinal tear; SB scleral buckle; PPV pars plana vitrectomy; VH Vitreous hemorrhage; PRP panretinal laser photocoagulation; IVK intravitreal kenalog; VMT vitreomacular traction; MH Macular hole;  NVD neovascularization of the disc; NVE neovascularization elsewhere; AREDS age related eye disease study; ARMD age related macular degeneration; POAG primary open angle glaucoma; EBMD epithelial/anterior basement membrane dystrophy; ACIOL anterior chamber intraocular lens; IOL intraocular lens; PCIOL posterior chamber intraocular lens; Phaco/IOL phacoemulsification with intraocular lens placement; [place] photorefractive keratectomy; LASIK laser assisted in situ keratomileusis; HTN hypertension; DM diabetes mellitus; COPD chronic obstructive pulmonary disease

## 2020-11-25 ENCOUNTER — Encounter: Payer: Self-pay | Admitting: Internal Medicine

## 2020-11-25 ENCOUNTER — Other Ambulatory Visit: Payer: Self-pay

## 2020-11-25 ENCOUNTER — Ambulatory Visit (AMBULATORY_SURGERY_CENTER): Payer: Medicare Other | Admitting: Internal Medicine

## 2020-11-25 VITALS — BP 144/75 | HR 85 | Temp 98.4°F | Resp 10 | Ht 69.0 in | Wt 152.0 lb

## 2020-11-25 DIAGNOSIS — R109 Unspecified abdominal pain: Secondary | ICD-10-CM

## 2020-11-25 DIAGNOSIS — R112 Nausea with vomiting, unspecified: Secondary | ICD-10-CM

## 2020-11-25 DIAGNOSIS — R1110 Vomiting, unspecified: Secondary | ICD-10-CM | POA: Diagnosis not present

## 2020-11-25 MED ORDER — SODIUM CHLORIDE 0.9 % IV SOLN: 500.0000 mL | Freq: Once | INTRAVENOUS | Status: DC

## 2020-11-25 NOTE — Progress Notes (Signed)
Sedate, gd SR, tolerated procedure well, VSS, report to RN 

## 2020-11-25 NOTE — Progress Notes (Signed)
VS-CW 

## 2020-11-25 NOTE — Patient Instructions (Signed)
Please read handouts provided. Continue present medications. Return to care of Dr. Ethelene Hal ASAP for ongoing evaluation and work-up of hyponatremia.   YOU HAD AN ENDOSCOPIC PROCEDURE TODAY AT [facility]:   Refer to the procedure report that was given to you for any specific questions about what was found during the examination.  If the procedure report does not answer your questions, please call your gastroenterologist to clarify.  If you requested that your care partner not be given the details of your procedure findings, then the procedure report has been included in a sealed envelope for you to review at your convenience later.  YOU SHOULD EXPECT: Some feelings of bloating in the abdomen. Passage of more gas than usual.  Walking can help get rid of the air that was put into your GI tract during the procedure and reduce the bloating. If you had a lower endoscopy (such as a colonoscopy or flexible sigmoidoscopy) you may notice spotting of blood in your stool or on the toilet paper. If you underwent a bowel prep for your procedure, you may not have a normal bowel movement for a few days.  Please Note:  You might notice some irritation and congestion in your nose or some drainage.  This is from the oxygen used during your procedure.  There is no need for concern and it should clear up in a day or so.  SYMPTOMS TO REPORT IMMEDIATELY:    Following upper endoscopy (EGD)  Vomiting of blood or coffee ground material  New chest pain or pain under the shoulder blades  Painful or persistently difficult swallowing  New shortness of breath  Fever of 100F or higher  Black, tarry-looking stools  For urgent or emergent issues, a gastroenterologist can be reached at any hour by calling [phone]. Do not use MyChart messaging for urgent concerns.    DIET:  We do recommend a small meal at first, but then you may proceed to your regular diet.  Drink plenty of fluids but you should  avoid alcoholic beverages for 24 hours.  ACTIVITY:  You should plan to take it easy for the rest of today and you should NOT DRIVE or use heavy machinery until tomorrow (because of the sedation medicines used during the test).    FOLLOW UP: Our staff will call the number listed on your records 48-72 hours following your procedure to check on you and address any questions or concerns that you may have regarding the information given to you following your procedure. If we do not reach you, we will leave a message.  We will attempt to reach you two times.  During this call, we will ask if you have developed any symptoms of COVID 19. If you develop any symptoms (ie: fever, flu-like symptoms, shortness of breath, cough etc.) before then, please call [phone].  If you test positive for Covid 19 in the 2 weeks post procedure, please call and report this information to [place].    If any biopsies were taken you will be contacted by phone or by letter within the next 1-3 weeks.  Please call us at [phone] if you have not heard about the biopsies in 3 weeks.    SIGNATURES/CONFIDENTIALITY: You and/or your care partner have signed paperwork which will be entered into your electronic medical record.  These signatures attest to the fact that that the information above on your After Visit Summary has been reviewed and is understood.  Full responsibility of the confidentiality  of this discharge information lies with you and/or your care-partner.  

## 2020-11-25 NOTE — Op Note (Signed)
[facility] Patient Name: Timothy [name] Procedure Date: 11/25/2020 10:45 AM MRN: [id number] Endoscopist: Docia Chuck. Henrene Pastor , MD Age: 70 Referring MD:  Date of Birth: 12/04/1949 Gender: Male Account #: [id number] Procedure:                Upper GI endoscopy Indications:              Nausea with vomiting Medicines:                Monitored Anesthesia Care Procedure:                Pre-Anesthesia Assessment:                           - Prior to the procedure, a History and Physical                            was performed, and patient medications and                            allergies were reviewed. The patient's tolerance of                            previous anesthesia was also reviewed. The risks                            and benefits of the procedure and the sedation                            options and risks were discussed with the patient.                            All questions were answered, and informed consent                            was obtained. Prior Anticoagulants: The patient has                            taken no previous anticoagulant or antiplatelet                            agents. ASA Grade Assessment: II - A patient with                            mild systemic disease. After reviewing the risks                            and benefits, the patient was deemed in                            satisfactory condition to undergo the procedure.                           After obtaining informed consent, the endoscope was  passed under direct vision. Throughout the                            procedure, the patient's blood pressure, pulse, and                            oxygen saturations were monitored continuously. The                            Endoscope was introduced through the mouth, and                            advanced to the second part of duodenum. The upper                            GI endoscopy was accomplished without  difficulty.                            The patient tolerated the procedure well. Scope In: Scope Out: Findings:                 The esophagus was normal.                           The stomach was normal.                           The examined duodenum was normal.                           The cardia and gastric fundus were normal on                            retroflexion. Complications:            No immediate complications. Estimated Blood Loss:     Estimated blood loss: none. Impression:               1. Normal EGD                           2. Episodic vomiting of on certain etiology                           3. Hyponatremia. Being worked up. Recommendation:           1. Patient has a contact number available for                            emergencies. The signs and symptoms of potential                            delayed complications were discussed with the                            patient. Return to normal activities tomorrow.  Written discharge instructions were provided to the                            patient.                           2. Resume previous diet.                           3. Continue present medications.                           4. Return to the care of Dr. Ethelene Hal ASAP for                            ongoing evaluation and work-up of hyponatremia                           5. Follow through with a CT of the head as                            previously recommended Docia Chuck. Henrene Pastor, MD 11/25/2020 11:17:22 AM This report has been signed electronically.

## 2020-11-25 NOTE — Progress Notes (Signed)
HISTORY OF PRESENT ILLNESS:  Timothy [name] is a 70 y.o. male who I saw in the office October 10, 2020 regarding episodic vomiting.  See that dictation.  Blood work revealed hyponatremia for which she was referred back to his PCP.  Work-up initiated.  However, patient has not been back to follow-up.  We did do a CT scan of the abdomen pelvis which was unremarkable.  CT scan of the head has been ordered but not performed yet (the patient was attending to issues related to her friend's death).  He presents today telling me that he has had several additional episodes of spontaneous vomiting.  Now for endoscopy.  He knows to follow through with the head CT as previously recommended as well as follow-up with his PCP ASAP  REVIEW OF SYSTEMS:  All non-GI ROS negative.  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   HYPERLIPIDEMIA 11/21/2007   Hypertension    Hypertriglyceridemia 08/15/2019   Left epiretinal membrane 07/19/2019   Need for influenza vaccination 12/22/2017   Need for shingles vaccine 09/02/2018   NEPHROLITHIASIS, HX OF 01/17/2007   OSTEOARTHRITIS 01/17/2007   Osteoarthritis 01/17/2007   Qualifier: Diagnosis of  By: Burnice Logan  MD, Doretha Sou    Posterior vitreous detachment of left eye 07/19/2019   Right epiretinal membrane 07/19/2019   Steatosis of liver 08/24/2019   Subacromial impingement 01/16/2019    Past Surgical History:   Procedure Laterality Date   APPENDECTOMY     CARPAL TUNNEL RELEASE Right    CATARACT EXTRACTION Bilateral    june 2019   HERNIA REPAIR     ingunial   KNEE SURGERY Right    x 4   MENISCUS REPAIR Left    knee; x 3   REPLACEMENT TOTAL KNEE Right    SHOULDER SURGERY     left   TOTAL KNEE ARTHROPLASTY     right    Social History Timothy [name]  reports that he has never smoked. His smokeless tobacco use includes chew. He reports current alcohol use of about 6.0 - 8.0 standard drinks per week. He reports that he does not use drugs.  family history includes Colon cancer (age of onset: 8) in his father.  No Known Allergies     PHYSICAL EXAMINATION:  Vital signs: BP (!) 143/82   Pulse 83   Temp 98.4 F (36.9 C) (Temporal)   Ht 5\' 9"  (1.753 m)   Wt 152 lb (68.9 kg)   SpO2 100%   BMI 22.45 kg/m  General: Well-developed, well-nourished, no acute distress HEENT: Sclerae are anicteric, conjunctiva pink. Oral mucosa intact Lungs: Clear Heart: Regular Abdomen: soft, nontender, nondistended, no obvious ascites, no peritoneal signs, normal bowel sounds. No organomegaly. Extremities: No edema Psychiatric: alert and oriented x3. Cooperative     ASSESSMENT:  1.  Episodic vomiting spells 2.  Hyponatremia of uncertain etiology   PLAN:  1.  EGD today 2.  CT of the head 3.  Follow-up PCP ASAP

## 2020-11-27 ENCOUNTER — Telehealth: Payer: Self-pay

## 2020-11-27 NOTE — Telephone Encounter (Signed)
  Follow up Call-  Call back number 11/25/2020  Post procedure Call Back phone  # [phone]  Permission to leave phone message Yes  Some recent data might be hidden     Patient questions:  Do you have a fever, pain , or abdominal swelling? No. Pain Score  0 *  Have you tolerated food without any problems? Yes.    Have you been able to return to your normal activities? Yes.    Do you have any questions about your discharge instructions: Diet   No. Medications  No. Follow up visit  No.  Do you have questions or concerns about your Care? No.  Actions: * If pain score is 4 or above: No action needed, pain <4.

## 2020-11-27 NOTE — Telephone Encounter (Signed)
Attempted f/u call back. No answer, left VM. 

## 2020-12-16 ENCOUNTER — Encounter (HOSPITAL_COMMUNITY): Payer: Self-pay

## 2020-12-16 ENCOUNTER — Other Ambulatory Visit: Payer: Self-pay

## 2020-12-16 ENCOUNTER — Ambulatory Visit (HOSPITAL_COMMUNITY)
Admission: RE | Admit: 2020-12-16 | Discharge: 2020-12-16 | Disposition: A | Discharge status: Home / Self Care | Payer: Medicare Other | Source: Ambulatory Visit | Attending: Internal Medicine | Admitting: Internal Medicine

## 2020-12-16 DIAGNOSIS — R109 Unspecified abdominal pain: Secondary | ICD-10-CM | POA: Diagnosis not present

## 2020-12-16 DIAGNOSIS — R1110 Vomiting, unspecified: Secondary | ICD-10-CM | POA: Diagnosis not present

## 2020-12-16 DIAGNOSIS — E871 Hypo-osmolality and hyponatremia: Secondary | ICD-10-CM | POA: Diagnosis not present

## 2020-12-16 DIAGNOSIS — R519 Headache, unspecified: Secondary | ICD-10-CM

## 2020-12-16 DIAGNOSIS — R110 Nausea: Secondary | ICD-10-CM | POA: Diagnosis not present

## 2020-12-16 DIAGNOSIS — R634 Abnormal weight loss: Secondary | ICD-10-CM | POA: Diagnosis not present

## 2020-12-16 MED ORDER — IOHEXOL 350 MG/ML SOLN
80.0000 mL | Freq: Once | INTRAVENOUS | Status: AC | PRN
  Administered 2020-12-16: 80 mL via INTRAVENOUS

## 2020-12-24 ENCOUNTER — Other Ambulatory Visit: Payer: Self-pay | Admitting: Family Medicine

## 2020-12-24 DIAGNOSIS — I10 Essential (primary) hypertension: Secondary | ICD-10-CM

## 2021-01-27 ENCOUNTER — Ambulatory Visit (INDEPENDENT_AMBULATORY_CARE_PROVIDER_SITE_OTHER): Payer: Medicare Other

## 2021-01-27 DIAGNOSIS — Z0000 Encounter for general adult medical examination without abnormal findings: Secondary | ICD-10-CM

## 2021-01-27 NOTE — Progress Notes (Signed)
Subjective:   Timothy [name] is a 70 y.o. male who presents for an Subsequent Medicare Annual Wellness Visit.  I connected with Timothy [name] today by telephone and verified that I am speaking with the correct person using two identifiers. Location patient: home Location provider: work Persons participating in the virtual visit: patient, provider.   I discussed the limitations, risks, security and privacy concerns of performing an evaluation and management service by telephone and the availability of in person appointments. I also discussed with the patient that there may be a patient responsible charge related to this service. The patient expressed understanding and verbally consented to this telephonic visit.    Interactive audio and video telecommunications were attempted between this provider and patient, however failed, due to patient having technical difficulties OR patient did not have access to video capability.  We continued and completed visit with audio only.    Review of Systems     Cardiac Risk Factors include: advanced age (>16men, >76 women);male gender     Objective:    Today's Vitals   There is no height or weight on file to calculate BMI.  Advanced Directives 01/27/2021 01/22/2020 01/17/2019  Does Patient Have a Medical Advance Directive? Yes Yes Yes  Type of Paramedic of [place];Living will [place];Living will [place];Living will  Does patient want to make changes to medical advance directive? - - No - Patient declined  Copy of [place] in Chart? No - copy requested No - copy requested No - copy requested    Current Medications (verified) Outpatient Encounter Medications as of 01/27/2021  Medication Sig   lisinopril (ZESTRIL) 20 MG tablet Take 1 tablet (20 mg total) by mouth daily. Take 1 tablet (20 mg total) by mouth daily. **Needs appt before anymore refills  given**   Multiple Vitamin (MULTIVITAMIN) tablet Take 1 tablet by mouth daily. Unknown strength   pantoprazole (PROTONIX) 40 MG tablet Take 1 tablet (40 mg total) by mouth daily.   No facility-administered encounter medications on file as of 01/27/2021.    Allergies (verified) Patient has no known allergies.   History: 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   HYPERLIPIDEMIA 11/21/2007   Hypertension    Hypertriglyceridemia 08/15/2019   Left epiretinal membrane 07/19/2019   Need for influenza vaccination 12/22/2017   Need for shingles vaccine 09/02/2018   NEPHROLITHIASIS, HX OF 01/17/2007   OSTEOARTHRITIS 01/17/2007   Osteoarthritis 01/17/2007   Qualifier: Diagnosis of  By: Burnice Logan  MD, Doretha Sou    Posterior vitreous detachment of left eye 07/19/2019   Right epiretinal membrane 07/19/2019   Steatosis of liver 08/24/2019   Subacromial impingement 01/16/2019   Past Surgical History:  Procedure Laterality Date   APPENDECTOMY     CARPAL TUNNEL RELEASE Right    CATARACT EXTRACTION Bilateral    june 2019  HERNIA REPAIR     ingunial   KNEE SURGERY Right    x 4   MENISCUS REPAIR Left    knee; x 3   REPLACEMENT TOTAL KNEE Right    SHOULDER SURGERY     left   TOTAL KNEE ARTHROPLASTY     right   Family History  Problem Relation Age of Onset   Colon cancer Father 25   Esophageal cancer Neg Hx    Rectal cancer Neg Hx     Stomach cancer Neg Hx    Pancreatic cancer Neg Hx    Liver disease Neg Hx    Social History   Socioeconomic History   Marital status: Married    Spouse name: Not on file   Number of children: Not on file   Years of education: Not on file   Highest education level: Not on file  Occupational History   Not on file  Tobacco Use   Smoking status: Never   Smokeless tobacco: Current    Types: Chew  Vaping Use   Vaping Use: Never used  Substance and Sexual Activity   Alcohol use: Yes    Alcohol/week: 6.0 - 8.0 standard drinks    Types: 6 - 8 Cans of beer per week   Drug use: No   Sexual activity: Not Currently  Other Topics Concern   Not on file  Social History Narrative   Not on file   Social Determinants of Health   Financial Resource Strain: Low Risk    Difficulty of Paying Living Expenses: Not hard at all  Food Insecurity: No Food Insecurity   Worried About Charity fundraiser in the Last Year: Never true   Ran Out of Food in the Last Year: Never true  Transportation Needs: No Transportation Needs   Lack of Transportation (Medical): No   Lack of Transportation (Non-Medical): No  Physical Activity: Sufficiently Active   Days of Exercise per Week: 6 days   Minutes of Exercise per Session: 60 min  Stress: No Stress Concern Present   Feeling of Stress : Not at all  Social Connections: Socially Integrated   Frequency of Communication with Friends and Family: Twice a week   Frequency of Social Gatherings with Friends and Family: Twice a week   Attends Religious Services: More than 4 times per year   Active Member of [employer] or Organizations: Yes   Attends Archivist Meetings: 1 to 4 times per year   Marital Status: Married    Tobacco Counseling Ready to quit: Not Answered Counseling given: Not Answered   Clinical Intake:  Pre-visit preparation completed: Yes  Pain : No/denies pain     Nutritional Risks: None Diabetes: No  How often do you need to  have someone help you when you read instructions, pamphlets, or other written materials from your doctor or pharmacy?: 1 - Never What is the last grade level you completed in school?: Masters  Diabetic?no   Interpreter Needed?: No  Information entered by :: L.Keirah Konitzer,LPN   Activities of Daily Living In your present state of health, do you have any difficulty performing the following activities: 01/27/2021  Hearing? N  Vision? N  Difficulty concentrating or making decisions? N  Walking or climbing stairs? N  Dressing or bathing? N  Doing errands, shopping? N  Preparing Food and eating ? N  Using the Toilet? N  In the past six months, have you accidently leaked urine? N  Do you have problems with  loss of bowel control? N  Managing your Medications? N  Managing your Finances? N  Housekeeping or managing your Housekeeping? N  Some recent data might be hidden    Patient Care Team: Libby Maw, MD as PCP - General (Family Medicine)  Indicate any recent Medical Services you may have received from other than [facility] providers in the past year (date may be approximate).     Assessment:   This is a routine wellness examination for Jaylee.  Hearing/Vision screen No results found.  Dietary issues and exercise activities discussed: Current Exercise Habits: Home exercise routine, Type of exercise: walking, Time (Minutes): 60, Frequency (Times/Week): 5, Weekly Exercise (Minutes/Week): 300, Intensity: Mild, Exercise limited by: None identified   Goals Addressed   None    Depression Screen PHQ 2/9 Scores 01/27/2021 01/27/2021 10/16/2020 01/22/2020 08/24/2019 01/17/2019 05/20/2015  PHQ - 2 Score 0 0 0 0 0 0 0    Fall Risk Fall Risk  01/27/2021 10/16/2020 01/22/2020 08/24/2019 01/17/2019  Falls in the past year? 0 0 0 0 0  Number falls in past yr: 0 - 0 - 0  Injury with Fall? 0 - 0 - 0  Risk for fall due to : - - - - -  Risk for fall due to: Comment - - - - -  Follow up Falls  evaluation completed - Falls prevention discussed - Education provided;Falls prevention discussed    FALL RISK PREVENTION PERTAINING TO THE HOME:  Any stairs in or around the home? Yes  If so, are there any without handrails? No  Home free of loose throw rugs in walkways, pet beds, electrical cords, etc? Yes  Adequate lighting in your home to reduce risk of falls? Yes   ASSISTIVE DEVICES UTILIZED TO PREVENT FALLS:  Life alert? No  Use of a cane, walker or w/c? No  Grab bars in the bathroom? Yes  Shower chair or bench in shower? Yes  Elevated toilet seat or a handicapped toilet? Yes    Cognitive Function:  Normal cognitive status assessed by direct observation by this Nurse Health Advisor. No abnormalities found.        Immunizations Immunization History  Administered Date(s) Administered   Fluad Quad(high Dose 65+) 10/24/2018, 11/28/2019   Influenza Split 01/04/2011   Influenza Whole 01/19/2007, 11/21/2007, 12/24/2008, 02/13/2010   Influenza, High Dose Seasonal PF 01/07/2015, 01/30/2016, 12/17/2016, 12/22/2017   Influenza,inj,Quad PF,6+ Mos 11/14/2012, 01/02/2014   Moderna Sars-Covid-2 Vaccination 04/11/2019, 05/11/2019   Pneumococcal Conjugate-13 05/20/2015   Pneumococcal Polysaccharide-23 12/17/2016   Tdap 11/14/2012   Zoster Recombinat (Shingrix) 08/22/2018, 10/24/2018   Zoster, Live 06/08/2010    TDAP status: Up to date  Flu Vaccine status: Up to date  Pneumococcal vaccine status: Up to date  Covid-19 vaccine status: Completed vaccines  Qualifies for Shingles Vaccine? Yes   Zostavax completed No   Shingrix Completed?: No.    Education has been provided regarding the importance of this vaccine. Patient has been advised to call insurance company to determine out of pocket expense if they have not yet received this vaccine. Advised may also receive vaccine at local pharmacy or Health Dept. Verbalized acceptance and understanding.  Screening Tests Health  Maintenance  Topic Date Due   INFLUENZA VACCINE  09/29/2020   TETANUS/TDAP  11/15/2022   COLONOSCOPY (Pts 45-63yrs Insurance coverage will need to be confirmed)  02/15/2023   Pneumonia Vaccine 11+ Years old  Completed   Hepatitis C Screening  Completed   Zoster Vaccines- Shingrix  Completed  HPV VACCINES  Aged Out   COVID-19 Vaccine  Discontinued    Health Maintenance  Health Maintenance Due  Topic Date Due   INFLUENZA VACCINE  09/29/2020    Colorectal cancer screening: Type of screening: Colonoscopy. Completed 02/14/2018. Repeat every 5 years  Lung Cancer Screening: (Low Dose CT Chest recommended if Age 28-80 years, 30 pack-year currently smoking OR have quit w/in 15years.) does not qualify.   Lung Cancer Screening Referral: n/a  Additional Screening:  Hepatitis C Screening: does not qualify; Completed 05/20/2015  Vision Screening: Recommended annual ophthalmology exams for early detection of glaucoma and other disorders of the eye. Is the patient up to date with their annual eye exam?  Yes  Who is the provider or what is the name of the office in which the patient attends annual eye exams? Dr. Glory Buff Zadie Rhine  If pt is not established with a provider, would they like to be referred to a provider to establish care? No .   Dental Screening: Recommended annual dental exams for proper oral hygiene  Community Resource Referral / Chronic Care Management: CRR required this visit?  No   CCM required this visit?  No      Plan:     I have personally reviewed and noted the following in the patient's chart:   Medical and social history Use of alcohol, tobacco or illicit drugs  Current medications and supplements including opioid prescriptions. Patient is not currently taking opioid prescriptions. Functional ability and status Nutritional status Physical activity Advanced directives List of other physicians Hospitalizations, surgeries, and ER visits in previous 12  months Vitals Screenings to include cognitive, depression, and falls Referrals and appointments  In addition, I have reviewed and discussed with patient certain preventive protocols, quality metrics, and best practice recommendations. A written personalized care plan for preventive services as well as general preventive health recommendations were provided to patient.     Randel Pigg, LPN   14/43/1540   Nurse Notes: none

## 2021-01-27 NOTE — Patient Instructions (Signed)
Timothy [name] , Thank you for taking time to come for your Medicare Wellness Visit. I appreciate your ongoing commitment to your health goals. Please review the following plan we discussed and let me know if I can assist you in the future.   Screening recommendations/referrals: Colonoscopy: 02/14/2018 Recommended yearly ophthalmology/optometry visit for glaucoma screening and checkup Recommended yearly dental visit for hygiene and checkup  Vaccinations: Influenza vaccine: will consider  Pneumococcal vaccine: completed  Tdap vaccine: 11/14/2012 Shingles vaccine: will consider     Advanced directives: will provide copies   Conditions/risks identified: none   Next appointment: none   Preventive Care 70 Years and Older, Male Preventive care refers to lifestyle choices and visits with your health care provider that can promote health and wellness. What does preventive care include? A yearly physical exam. This is also called an annual well check. Dental exams once or twice a year. Routine eye exams. Ask your health care provider how often you should have your eyes checked. Personal lifestyle choices, including: Daily care of your teeth and gums. Regular physical activity. Eating a healthy diet. Avoiding tobacco and drug use. Limiting alcohol use. Practicing safe sex. Taking low doses of aspirin every day. Taking vitamin and mineral supplements as recommended by your health care provider. What happens during an annual well check? The services and screenings done by your health care provider during your annual well check will depend on your age, overall health, lifestyle risk factors, and family history of disease. Counseling  Your health care provider may ask you questions about your: Alcohol use. Tobacco use. Drug use. Emotional well-being. Home and relationship well-being. Sexual activity. Eating habits. History of falls. Memory and ability to understand (cognition). Work  and work Statistician. Screening  You may have the following tests or measurements: Height, weight, and BMI. Blood pressure. Lipid and cholesterol levels. These may be checked every 5 years, or more frequently if you are over 74 years old. Skin check. Lung cancer screening. You may have this screening every year starting at age 33 if you have a 30-pack-year history of smoking and currently smoke or have quit within the past 15 years. Fecal occult blood test (FOBT) of the stool. You may have this test every year starting at age 90. Flexible sigmoidoscopy or colonoscopy. You may have a sigmoidoscopy every 5 years or a colonoscopy every 10 years starting at age 66. Prostate cancer screening. Recommendations will vary depending on your family history and other risks. Hepatitis C blood test. Hepatitis B blood test. Sexually transmitted disease (STD) testing. Diabetes screening. This is done by checking your blood sugar (glucose) after you have not eaten for a while (fasting). You may have this done every 1-3 years. Abdominal aortic aneurysm (AAA) screening. You may need this if you are a current or former smoker. Osteoporosis. You may be screened starting at age 82 if you are at high risk. Talk with your health care provider about your test results, treatment options, and if necessary, the need for more tests. Vaccines  Your health care provider may recommend certain vaccines, such as: Influenza vaccine. This is recommended every year. Tetanus, diphtheria, and acellular pertussis (Tdap, Td) vaccine. You may need a Td booster every 10 years. Zoster vaccine. You may need this after age 62. Pneumococcal 13-valent conjugate (PCV13) vaccine. One dose is recommended after age 97. Pneumococcal polysaccharide (PPSV23) vaccine. One dose is recommended after age 43. Talk to your health care provider about which screenings and vaccines you need and  how often you need them. This information is not intended  to replace advice given to you by your health care provider. Make sure you discuss any questions you have with your health care provider. Document Released: 03/14/2015 Document Revised: 11/05/2015 Document Reviewed: 12/17/2014 Elsevier Interactive Patient Education  2017 [employer] Prevention in the Home Falls can cause injuries. They can happen to people of all ages. There are many things you can do to make your home safe and to help prevent falls. What can I do on the outside of my home? Regularly fix the edges of walkways and driveways and fix any cracks. Remove anything that might make you trip as you walk through a door, such as a raised step or threshold. Trim any bushes or trees on the path to your home. Use bright outdoor lighting. Clear any walking paths of anything that might make someone trip, such as rocks or tools. Regularly check to see if handrails are loose or broken. Make sure that both sides of any steps have handrails. Any raised decks and porches should have guardrails on the edges. Have any leaves, snow, or ice cleared regularly. Use sand or salt on walking paths during winter. Clean up any spills in your garage right away. This includes oil or grease spills. What can I do in the bathroom? Use night lights. Install grab bars by the toilet and in the tub and shower. Do not use towel bars as grab bars. Use non-skid mats or decals in the tub or shower. If you need to sit down in the shower, use a plastic, non-slip stool. Keep the floor dry. Clean up any water that spills on the floor as soon as it happens. Remove soap buildup in the tub or shower regularly. Attach bath mats securely with double-sided non-slip rug tape. Do not have throw rugs and other things on the floor that can make you trip. What can I do in the bedroom? Use night lights. Make sure that you have a light by your bed that is easy to reach. Do not use any sheets or blankets that are too big  for your bed. They should not hang down onto the floor. Have a firm chair that has side arms. You can use this for support while you get dressed. Do not have throw rugs and other things on the floor that can make you trip. What can I do in the kitchen? Clean up any spills right away. Avoid walking on wet floors. Keep items that you use a lot in easy-to-reach places. If you need to reach something above you, use a strong step stool that has a grab bar. Keep electrical cords out of the way. Do not use floor polish or wax that makes floors slippery. If you must use wax, use non-skid floor wax. Do not have throw rugs and other things on the floor that can make you trip. What can I do with my stairs? Do not leave any items on the stairs. Make sure that there are handrails on both sides of the stairs and use them. Fix handrails that are broken or loose. Make sure that handrails are as long as the stairways. Check any carpeting to make sure that it is firmly attached to the stairs. Fix any carpet that is loose or worn. Avoid having throw rugs at the top or bottom of the stairs. If you do have throw rugs, attach them to the floor with carpet tape. Make sure that you have  a light switch at the top of the stairs and the bottom of the stairs. If you do not have them, ask someone to add them for you. What else can I do to help prevent falls? Wear shoes that: Do not have high heels. Have rubber bottoms. Are comfortable and fit you well. Are closed at the toe. Do not wear sandals. If you use a stepladder: Make sure that it is fully opened. Do not climb a closed stepladder. Make sure that both sides of the stepladder are locked into place. Ask someone to hold it for you, if possible. Clearly mark and make sure that you can see: Any grab bars or handrails. First and last steps. Where the edge of each step is. Use tools that help you move around (mobility aids) if they are needed. These  include: Canes. Walkers. Scooters. Crutches. Turn on the lights when you go into a dark area. Replace any light bulbs as soon as they burn out. Set up your furniture so you have a clear path. Avoid moving your furniture around. If any of your floors are uneven, fix them. If there are any pets around you, be aware of where they are. Review your medicines with your doctor. Some medicines can make you feel dizzy. This can increase your chance of falling. Ask your doctor what other things that you can do to help prevent falls. This information is not intended to replace advice given to you by your health care provider. Make sure you discuss any questions you have with your health care provider. Document Released: 12/12/2008 Document Revised: 07/24/2015 Document Reviewed: 03/22/2014 Elsevier Interactive Patient Education  2017 [employer].

## 2021-02-23 ENCOUNTER — Other Ambulatory Visit: Payer: Self-pay | Admitting: Family

## 2021-03-01 ENCOUNTER — Encounter (HOSPITAL_BASED_OUTPATIENT_CLINIC_OR_DEPARTMENT_OTHER): Payer: Self-pay | Admitting: *Deleted

## 2021-03-01 ENCOUNTER — Emergency Department (HOSPITAL_BASED_OUTPATIENT_CLINIC_OR_DEPARTMENT_OTHER)
Admission: EM | Admit: 2021-03-01 | Discharge: 2021-03-01 | Disposition: A | Discharge status: Home / Self Care | Payer: Medicare Other | Attending: Emergency Medicine | Admitting: Emergency Medicine

## 2021-03-01 ENCOUNTER — Other Ambulatory Visit: Payer: Self-pay

## 2021-03-01 ENCOUNTER — Emergency Department (HOSPITAL_BASED_OUTPATIENT_CLINIC_OR_DEPARTMENT_OTHER): Payer: Medicare Other

## 2021-03-01 DIAGNOSIS — J069 Acute upper respiratory infection, unspecified: Secondary | ICD-10-CM | POA: Diagnosis not present

## 2021-03-01 DIAGNOSIS — Z79899 Other long term (current) drug therapy: Secondary | ICD-10-CM | POA: Insufficient documentation

## 2021-03-01 DIAGNOSIS — R059 Cough, unspecified: Secondary | ICD-10-CM | POA: Diagnosis not present

## 2021-03-01 DIAGNOSIS — I10 Essential (primary) hypertension: Secondary | ICD-10-CM | POA: Insufficient documentation

## 2021-03-01 DIAGNOSIS — Z20822 Contact with and (suspected) exposure to covid-19: Secondary | ICD-10-CM | POA: Insufficient documentation

## 2021-03-01 LAB — RESP PANEL BY RT-PCR (FLU A&B, COVID) ARPGX2
Influenza A by PCR: NEGATIVE
Influenza B by PCR: NEGATIVE
SARS Coronavirus 2 by RT PCR: NEGATIVE

## 2021-03-01 MED ORDER — PREDNISONE 20 MG PO TABS: 40.0000 mg | ORAL_TABLET | Freq: Every day | ORAL | 0 refills | Status: DC

## 2021-03-01 MED ORDER — ALBUTEROL SULFATE HFA 108 (90 BASE) MCG/ACT IN AERS
2.0000 | INHALATION_SPRAY | Freq: Once | RESPIRATORY_TRACT | Status: AC
  Administered 2021-03-01: 2 via RESPIRATORY_TRACT
  Filled 2021-03-01: qty 6.7

## 2021-03-01 MED ORDER — DOXYCYCLINE HYCLATE 100 MG PO TABS: 100.0000 mg | ORAL_TABLET | Freq: Once | ORAL | Status: DC

## 2021-03-01 NOTE — Discharge Instructions (Addendum)
You were seen in the emergency department for upper respiratory symptoms.  Your chest x-ray did not show any pneumonia and your COVID and flu tests are negative.  You can use the albuterol inhaler 2 puffs every 4 hours as needed.  Prednisone for 5 days.  Follow-up with your primary care doctor.  Return to the emergency department if any worsening or concerning symptoms.

## 2021-03-01 NOTE — ED Notes (Signed)
Pt transported to radiology.

## 2021-03-01 NOTE — ED Triage Notes (Signed)
Pt is here for URI which began Tuesday with a cough.  Pt began feeling worse with increasing cough, fever and body aches.  Pt wife had same before him and was seen at her MD and dx with "post viral illness" and placed on prednisone and cough medication with codeine and an inhaler.

## 2021-03-01 NOTE — ED Provider Notes (Signed)
[facility] Provider Note   CSN: [id number] Arrival date & time: 03/01/21  1007     History  Chief Complaint  Patient presents with   Illness    Timothy [name] is a 71 y.o. male.  He has a history of hypertension.  Complaining of cough that began about 5 days ago productive of white sputum.  Low-grade fevers and body aches.  Head congestion and runny nose.  Wife similar.  He is COVID vaccinated and flu vaccinated.  Non-smoker  The history is provided by the patient.  Influenza Presenting symptoms: cough, fever, myalgias, rhinorrhea and shortness of breath   Presenting symptoms: no diarrhea, no headaches, no nausea, no sore throat and no vomiting   Severity:  Moderate Onset quality:  Gradual Duration:  5 days Progression:  Unchanged Chronicity:  New Relieved by:  None tried Worsened by:  Nothing Ineffective treatments:  None tried Associated symptoms: nasal congestion   Associated symptoms: no mental status change and no syncope   Risk factors: sick contacts       Home Medications Prior to Admission medications   Medication Sig Start Date End Date Taking? Authorizing Provider  lisinopril (ZESTRIL) 20 MG tablet Take 1 tablet (20 mg total) by mouth daily. Take 1 tablet (20 mg total) by mouth daily. **Needs appt before anymore refills given** 10/29/20   Libby Maw, MD  Multiple Vitamin (MULTIVITAMIN) tablet Take 1 tablet by mouth daily. Unknown strength    [provider]  pantoprazole (PROTONIX) 40 MG tablet Take 1 tablet (40 mg total) by mouth daily. 10/10/20   Irene Shipper, MD      Allergies    Patient has no known allergies.    Review of Systems   Review of Systems  Constitutional:  Positive for fever.  HENT:  Positive for congestion and rhinorrhea. Negative for sore throat.   Eyes:  Negative for visual disturbance.  Respiratory:  Positive for cough and shortness of breath.   Cardiovascular:  Negative for  chest pain.  Gastrointestinal:  Negative for abdominal pain, diarrhea, nausea and vomiting.  Genitourinary:  Negative for dysuria.  Musculoskeletal:  Positive for myalgias.  Skin:  Negative for rash.  Neurological:  Negative for headaches.   Physical Exam Updated Vital Signs BP (!) 148/83    Pulse 100    Temp 98.2 F (36.8 C) (Oral)    Resp 14    Wt 68 kg    SpO2 96%    BMI 22.15 kg/m  Physical Exam Vitals and nursing note reviewed.  Constitutional:      General: He is not in acute distress.    Appearance: Normal appearance. He is well-developed.  HENT:     Head: Normocephalic and atraumatic.  Eyes:     Conjunctiva/sclera: Conjunctivae normal.  Cardiovascular:     Rate and Rhythm: Normal rate and regular rhythm.     Heart sounds: No murmur heard. Pulmonary:     Effort: Pulmonary effort is normal. No respiratory distress.     Breath sounds: Normal breath sounds.  Abdominal:     Palpations: Abdomen is soft.     Tenderness: There is no abdominal tenderness. There is no guarding or rebound.  Musculoskeletal:        General: No swelling.     Cervical back: Neck supple.     Right lower leg: No edema.     Left lower leg: No edema.  Skin:    General: Skin is warm  and dry.     Capillary Refill: Capillary refill takes less than 2 seconds.  Neurological:     General: No focal deficit present.     Mental Status: He is alert.  Psychiatric:        Mood and Affect: Mood normal.    ED Results / Procedures / Treatments   Labs (all labs ordered are listed, but only abnormal results are displayed) Labs Reviewed  RESP PANEL BY RT-PCR (FLU A&B, COVID) [id number]    EKG None  Radiology DG Chest 2 View  Result Date: 03/01/2021 CLINICAL DATA:  Cough for 1 week. EXAM: CHEST - 2 VIEW COMPARISON:  None. FINDINGS: Normal heart, mediastinum and hila. Clear lungs.  No pleural effusion or pneumothorax. Skeletal structures are intact. IMPRESSION: No active cardiopulmonary disease. Electronically  Signed   By: Lajean Manes M.D.   On: 03/01/2021 10:59    Procedures Procedures    Medications Ordered in ED Medications  albuterol (VENTOLIN HFA) 108 (90 Base) MCG/ACT inhaler 2 puff (has no administration in time range)    ED Course/ Medical Decision Making/ A&P Clinical Course as of 03/01/21 1744  Sun Mar 01, 2021  1100 Chest x-ray interpreted by me as no clear infiltrate.  Awaiting radiology reading. [MB]  1125 Patient states he feels a little better after the breathing treatment although it made him jittery.  COVID and flu negative.  Chest x-ray clear.  Will treat with steroids and inhaler.  Recommended close follow-up with PCP and watch for symptoms that might indicate bacterial infection.  He is comfortable plan. [MB]    Clinical Course User Index [MB] Hayden Rasmussen, MD                           Medical Decision Making  Hyatt Capobianco was evaluated in Emergency Department on 03/01/2021 for the symptoms described in the history of present illness. He was evaluated in the context of the global COVID-19 pandemic, which necessitated consideration that the patient might be at risk for infection with the SARS-CoV-2 virus that causes COVID-19. Institutional protocols and algorithms that pertain to the evaluation of patients at risk for COVID-19 are in a state of rapid change based on information released by regulatory bodies including the CDC and federal and state organizations. These policies and algorithms were followed during the patient's care in the ED. Differential diagnosis includes COVID, flu, bronchitis, sinusitis.  COVID and flu ordered and are negative.  Chest x-ray does not show any acute infiltrates.  Patient otherwise nontoxic-appearing and satting well on room air.  Will treat symptomatically with steroids and inhaler.  Recommended close follow-up with PCP.  Return instructions discussed        Final Clinical Impression(s) / ED Diagnoses Final diagnoses:   Viral URI with cough    Rx / DC Orders ED Discharge Orders          Ordered    predniSONE (DELTASONE) 20 MG tablet  Daily        03/01/21 1127              Hayden Rasmussen, MD 03/01/21 1746

## 2021-03-16 ENCOUNTER — Ambulatory Visit (INDEPENDENT_AMBULATORY_CARE_PROVIDER_SITE_OTHER): Payer: Medicare Other | Admitting: Cardiology

## 2021-03-16 ENCOUNTER — Other Ambulatory Visit: Payer: Self-pay

## 2021-03-16 ENCOUNTER — Encounter: Payer: Self-pay | Admitting: Cardiology

## 2021-03-16 VITALS — BP 162/82 | HR 98 | Ht 69.0 in | Wt 145.0 lb

## 2021-03-16 DIAGNOSIS — K760 Fatty (change of) liver, not elsewhere classified: Secondary | ICD-10-CM | POA: Diagnosis not present

## 2021-03-16 DIAGNOSIS — I10 Essential (primary) hypertension: Secondary | ICD-10-CM

## 2021-03-16 DIAGNOSIS — E785 Hyperlipidemia, unspecified: Secondary | ICD-10-CM | POA: Diagnosis not present

## 2021-03-16 MED ORDER — LISINOPRIL 20 MG PO TABS: 20.0000 mg | ORAL_TABLET | Freq: Every day | ORAL | 3 refills | Status: DC

## 2021-03-16 NOTE — Progress Notes (Signed)
Cardiology Office Note:    Date:  03/16/2021   ID:  [name], DOB 12/04/1949, MRN [id number]  PCP:  Libby Maw, MD  Cardiologist:  [name], MD    Referring MD: Libby Maw,*   Chief Complaint  Patient presents with   Follow-up  I am doing much better  History of Present Illness:    Timothy [name] is a 71 y.o. male who was referred to [place] originally because of a constellation of atypical symptoms that included chest pain.  He did have a stress test done about 2 years ago which was negative.  Last time when I seen him he started getting better but he did have some GI symptoms he end up having a work-up done by gastroenterology which apparently was negative.  Today he comes to my office for follow-up overall he is doing great he said he feels best ever.  Denies have any chest pain tightness squeezing pressure burning chest no palpitation dizziness swelling of lower extremities.  Still very active have no difficulty doing activities of daily living.  He is convinced that his symptomatology before was related to vaccine that he got for his COVID.  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   HYPERLIPIDEMIA 11/21/2007   Hypertension    Hypertriglyceridemia 08/15/2019   Left epiretinal membrane 07/19/2019   Need for influenza vaccination 12/22/2017   Need for shingles vaccine 09/02/2018    NEPHROLITHIASIS, HX OF 01/17/2007   OSTEOARTHRITIS 01/17/2007   Osteoarthritis 01/17/2007   Qualifier: Diagnosis of  By: Burnice Logan  MD, Doretha Sou    Posterior vitreous detachment of left eye 07/19/2019   Right epiretinal membrane 07/19/2019   Steatosis of liver 08/24/2019   Subacromial impingement 01/16/2019    Past Surgical History:  Procedure Laterality Date   APPENDECTOMY     CARPAL TUNNEL RELEASE Right    CATARACT EXTRACTION Bilateral    june 2019   HERNIA REPAIR     ingunial   KNEE SURGERY Right    x 4   MENISCUS REPAIR Left    knee; x 3   REPLACEMENT TOTAL KNEE Right    SHOULDER SURGERY     left   TOTAL KNEE ARTHROPLASTY     right    Current Medications: Current Meds  Medication Sig   Multiple Vitamin (MULTIVITAMIN) tablet Take 1 tablet by mouth daily. Unknown strength   pantoprazole (PROTONIX) 40 MG tablet Take 1 tablet (40 mg total) by mouth daily.   [DISCONTINUED] lisinopril (ZESTRIL) 20 MG tablet Take 1 tablet (20 mg total) by mouth daily. Take 1 tablet (20 mg total) by mouth daily. **Needs appt before anymore refills given**     Allergies:   Patient has no known allergies.   Social History   Socioeconomic History   Marital status: Married    Spouse name: Not on file   Number of children: Not  on file   Years of education: Not on file   Highest education level: Not on file  Occupational History   Not on file  Tobacco Use   Smoking status: Never   Smokeless tobacco: Current    Types: Chew  Vaping Use   Vaping Use: Never used  Substance and Sexual Activity   Alcohol use: Yes    Alcohol/week: 6.0 - 8.0 standard drinks    Types: 6 - 8 Cans of beer per week   Drug use: No   Sexual activity: Not Currently  Other Topics Concern   Not on file  Social History Narrative   Not on file   Social Determinants of Health   Financial Resource Strain: Low Risk    Difficulty of Paying Living Expenses: Not hard at all  Food Insecurity: No Food Insecurity    Worried About Charity fundraiser in the Last Year: Never true   Ran Out of Food in the Last Year: Never true  Transportation Needs: No Transportation Needs   Lack of Transportation (Medical): No   Lack of Transportation (Non-Medical): No  Physical Activity: Sufficiently Active   Days of Exercise per Week: 6 days   Minutes of Exercise per Session: 60 min  Stress: No Stress Concern Present   Feeling of Stress : Not at all  Social Connections: Socially Integrated   Frequency of Communication with Friends and Family: Twice a week   Frequency of Social Gatherings with Friends and Family: Twice a week   Attends Religious Services: More than 4 times per year   Active Member of [employer] or Organizations: Yes   Attends Archivist Meetings: 1 to 4 times per year   Marital Status: Married     Family History: The patient's family history includes Colon cancer (age of onset: 17) in his father. There is no history of Esophageal cancer, Rectal cancer, Stomach cancer, Pancreatic cancer, or Liver disease. ROS:   Please see the history of present illness.    All 14 point review of systems negative except as described per history of present illness  EKGs/Labs/Other Studies Reviewed:      Recent Labs: 10/16/2020: ALT 69; BUN 7; Creatinine, Ser 0.92; Hemoglobin 12.7; Platelets 99.0; Potassium 4.4; Sodium 133  Recent Lipid Panel    Component Value Date/Time   CHOL 384 (H) 07/11/2019 0829   TRIG (H) 07/11/2019 0829    947.0 Triglyceride is over 400; calculations on Lipids are invalid.   HDL 30.00 (L) 07/11/2019 0829   CHOLHDL 13 07/11/2019 0829   VLDL 18.0 07/14/2017 1112   LDLCALC 61 08/22/2018 1109   LDLDIRECT 75.0 07/11/2019 0829    Physical Exam:    VS:  BP (!) 162/82 (BP Location: Right Arm, Patient Position: Sitting, Cuff Size: Normal)    Pulse 98    Ht 5\' 9"  (1.753 m)    Wt 145 lb (65.8 kg)    SpO2 98%    BMI 21.41 kg/m     Wt Readings from Last 3 Encounters:  03/16/21 145 lb  (65.8 kg)  03/01/21 150 lb (68 kg)  11/25/20 152 lb (68.9 kg)     GEN:  Well nourished, well developed in no acute distress HEENT: Normal NECK: No JVD; No carotid bruits LYMPHATICS: No lymphadenopathy CARDIAC: RRR, no murmurs, no rubs, no gallops RESPIRATORY:  Clear to auscultation without rales, wheezing or rhonchi  ABDOMEN: Soft, non-tender, non-distended MUSCULOSKELETAL:  No edema; No deformity  SKIN: Warm and dry LOWER EXTREMITIES:  no swelling NEUROLOGIC:  Alert and oriented x 3 PSYCHIATRIC:  Normal affect   ASSESSMENT:    1. Steatosis of liver   2. Essential hypertension   3. Dyslipidemia    PLAN:    In order of problems listed above:  Status of the liver he does have significant hypertriglyceridemia.  I will recheck his fasting lipid profile today.  He may require referral to lipid clinic.  That could contribute to his symptomatology that he suffered from before. Essential hypertension.  Blood pressure elevated in the office however he tells me at home it is always 130/80 and below.  Therefore we will continue monitoring. Dyslipidemia again his LDL was 61 HDL 30 however his triglycerides last time in 2021 was 4110 to clearly does have hypertriglyceridemia would need to be managed.  We will check potassium probably refer him to lipid clinic We did talk about healthy lifestyle need to exercise on the regular basis and exercise which he does   Medication Adjustments/Labs and Tests Ordered: Current medicines are reviewed at length with the patient today.  Concerns regarding medicines are outlined above.  Orders Placed This Encounter  Procedures   Lipid Profile   Hepatic function panel   Basic Metabolic Panel (BMET)   Medication changes:  Meds ordered this encounter  Medications   lisinopril (ZESTRIL) 20 MG tablet    Sig: Take 1 tablet (20 mg total) by mouth daily. Take 1 tablet (20 mg total) by mouth daily. **Needs appt before anymore refills given**    Dispense:  90  tablet    Refill:  3    Signed, Park Liter, MD, [facility] 03/16/2021 9:03 AM    [place]

## 2021-03-16 NOTE — Patient Instructions (Signed)
Medication Instructions:  Your physician recommends that you continue on your current medications as directed. Please refer to the Current Medication list given to you today.  *If you need a refill on your cardiac medications before your next appointment, please call your pharmacy*   Lab Work: Your physician recommends that you return for lab work in: Labs today: Fasting lipid profile, LFT, BMP If you have labs (blood work) drawn today and your tests are completely normal, you will receive your results only by: [place] (if you have MyChart) OR A paper copy in the mail If you have any lab test that is abnormal or we need to change your treatment, we will call you to review the results.   Testing/Procedures: None    Follow-Up: At [facility], you and your health needs are our priority.  As part of our continuing mission to provide you with exceptional heart care, we have created designated Provider Care Teams.  These Care Teams include your primary Cardiologist (physician) and Advanced Practice Providers (APPs -  Physician Assistants and Nurse Practitioners) who all work together to provide you with the care you need, when you need it.  We recommend signing up for the patient portal called "MyChart".  Sign up information is provided on this After Visit Summary.  MyChart is used to connect with patients for Virtual Visits (Telemedicine).  Patients are able to view lab/test results, encounter notes, upcoming appointments, etc.  Non-urgent messages can be sent to your provider as well.   To learn more about what you can do with MyChart, go to [email or web address].    Your next appointment:   6 month(s)  The format for your next appointment:   In Person  Provider:   [name], MD    Other Instructions None

## 2021-03-17 LAB — LIPID PANEL
Chol/HDL Ratio: 3.3 ratio (ref 0.0–5.0)
Cholesterol, Total: 212 mg/dL — ABNORMAL HIGH (ref 100–199)
HDL: 64 mg/dL (ref >39)
LDL Chol Calc (NIH): 124 mg/dL — ABNORMAL HIGH (ref 0–99)
Triglycerides: 137 mg/dL (ref 0–149)
VLDL Cholesterol Cal: 24 mg/dL (ref 5–40)

## 2021-03-17 LAB — BASIC METABOLIC PANEL
BUN/Creatinine Ratio: 12 (ref 10–24)
BUN: 10 mg/dL (ref 8–27)
CO2: 22 mmol/L (ref 20–29)
Calcium: 9.7 mg/dL (ref 8.6–10.2)
Chloride: 97 mmol/L (ref 96–106)
Creatinine, Ser: 0.86 mg/dL (ref 0.76–1.27)
Glucose: 83 mg/dL (ref 70–99)
Potassium: 4.3 mmol/L (ref 3.5–5.2)
Sodium: 136 mmol/L (ref 134–144)
eGFR: 93 mL/min/{1.73_m2} (ref >59)

## 2021-03-17 LAB — HEPATIC FUNCTION PANEL
ALT: 52 IU/L — ABNORMAL HIGH (ref 0–44)
AST: 64 IU/L — ABNORMAL HIGH (ref 0–40)
Albumin: 4.4 g/dL (ref 3.7–4.7)
Alkaline Phosphatase: 108 IU/L (ref 44–121)
Bilirubin Total: 0.4 mg/dL (ref 0.0–1.2)
Bilirubin, Direct: 0.13 mg/dL (ref 0.00–0.40)
Total Protein: 7.5 g/dL (ref 6.0–8.5)

## 2021-04-23 DIAGNOSIS — L570 Actinic keratosis: Secondary | ICD-10-CM | POA: Diagnosis not present

## 2021-04-23 DIAGNOSIS — L821 Other seborrheic keratosis: Secondary | ICD-10-CM | POA: Diagnosis not present

## 2021-04-23 DIAGNOSIS — L308 Other specified dermatitis: Secondary | ICD-10-CM | POA: Diagnosis not present

## 2021-04-23 DIAGNOSIS — L812 Freckles: Secondary | ICD-10-CM | POA: Diagnosis not present

## 2021-04-23 DIAGNOSIS — L218 Other seborrheic dermatitis: Secondary | ICD-10-CM | POA: Diagnosis not present

## 2021-05-13 ENCOUNTER — Encounter: Payer: Self-pay | Admitting: Family Medicine

## 2021-05-13 ENCOUNTER — Ambulatory Visit (INDEPENDENT_AMBULATORY_CARE_PROVIDER_SITE_OTHER): Payer: Medicare Other | Admitting: Family Medicine

## 2021-05-13 VITALS — BP 142/76 | HR 108 | Temp 98.2°F | Resp 17 | Ht 69.0 in | Wt 145.8 lb

## 2021-05-13 DIAGNOSIS — M2550 Pain in unspecified joint: Secondary | ICD-10-CM | POA: Diagnosis not present

## 2021-05-13 DIAGNOSIS — I10 Essential (primary) hypertension: Secondary | ICD-10-CM

## 2021-05-13 DIAGNOSIS — E785 Hyperlipidemia, unspecified: Secondary | ICD-10-CM | POA: Diagnosis not present

## 2021-05-13 DIAGNOSIS — I700 Atherosclerosis of aorta: Secondary | ICD-10-CM

## 2021-05-13 DIAGNOSIS — E871 Hypo-osmolality and hyponatremia: Secondary | ICD-10-CM | POA: Diagnosis not present

## 2021-05-13 MED ORDER — MELOXICAM 7.5 MG PO TABS: 7.5000 mg | ORAL_TABLET | Freq: Every day | ORAL | 0 refills | Status: DC | PRN

## 2021-05-13 NOTE — Progress Notes (Signed)
? ?Subjective:  ?Patient ID: Timothy [name], male    DOB: 12/04/1949  Age: 71 y.o. MRN: [id number] ? ?CC:  ?Chief Complaint  ?Patient presents with  ? Establish Care  ?  Pt here to establish care felt he didn't mesh well with Dr Ethelene Hal,   ? ? ?HPI ?Timothy [name] presents for  ? ?New patient to establish care.  Previous PCP Dr. Ethelene Hal. Not good fit. Prior Dr. Burnice Logan.  ? ?Works 1/2 days, owns his own company, Public relations account executive background. Electronics. [employer] prior. [place].  ? ?Wears hearing aids - left one broken - has through [place]. Plans to follow up there.  ? ?Hypertension: ?Lisinopril 20 mg daily.  ?No recent missed doses, no new side effects.  ?Cardiologist Dr. Agustin Cree.  ?Previous eval for atypical chest pain with negative stress testing.  Improved with use of PPI.  ?Home readings: daily - 130's over 70's. Elevates in medical office.  ?BP Readings from Last 3 Encounters:  ?05/13/21 (!) 142/76  ?03/16/21 (!) 162/82  ?03/01/21 (!) 148/83  ? ?Lab Results  ?Component Value Date  ? CREATININE 0.86 03/16/2021  ? ?GERD, nausea/vomiting. ?Endoscopy on 11/25/2020 by Dr. Henrene Pastor that was normal. ?Noted to have hyponatremia previously, CT abdomen pelvis in August 2022 with no acute findings, tiny nonobstructing left renal calculus and mild hepatic steatosis.  Aortic atherosclerosis also noted. ?Protonix 40 mg daily - no breakthrough symptoms.  ? ?Aortic atherosclerosis: ?Noted on CT imaging above.  Most recent lipid panel with elevated LDL of 124. ?Prior on atorvastatin - off for 8-10 months. No initial side effects, then some muscle cramps possible. Still some cramps, arthralgias.  ?Mobic 7.5 rarely taken in past for various joint issues - would only use for few days ? ?The 10-year ASCVD risk score (Arnett DK, et al., 2019) is: 24.4% ?  Values used to calculate the score: ?    Age: 49 years ?    Sex: Male ?    Is Non-Hispanic African American: No ?     Diabetic: No ?    Tobacco smoker: No ?    Systolic Blood Pressure: 053 mmHg ?    Is BP treated: Yes ?    HDL Cholesterol: 64 mg/dL ?    Total Cholesterol: 212 mg/dL ? ?Lab Results  ?Component Value Date  ? CHOL 212 (H) 03/16/2021  ? HDL 64 03/16/2021  ? LDLCALC 124 (H) 03/16/2021  ? LDLDIRECT 75.0 07/11/2019  ? TRIG 137 03/16/2021  ? CHOLHDL 3.3 03/16/2021  ? ?Hyponatremia ?Resolved on most recent testing January 16.  Previous evaluation by prior PCP. ?Lab Results  ?Component Value Date  ? NA 136 03/16/2021  ? K 4.3 03/16/2021  ? CL 97 03/16/2021  ? CO2 22 03/16/2021  ? ? ? ?History ?Patient Active Problem List  ? Diagnosis Date Noted  ? Pseudophakia, both eyes 11/18/2020  ? Pancytopenia ([place]) 10/17/2020  ? Hyponatremia 10/17/2020  ? Hypertension   ? Chronic kidney disease   ? COVID-19 01/2020  ? Pain in joint of right knee 11/28/2019  ? Steatosis of liver 08/24/2019  ? Bilateral hearing loss 08/24/2019  ? Hypertriglyceridemia 08/15/2019  ? Follow-up examination after eye surgery 07/19/2019  ? Right epiretinal membrane 07/19/2019  ? Left epiretinal membrane 07/19/2019  ? Posterior vitreous detachment of left eye 07/19/2019  ? Atypical chest pain 07/13/2019  ? Subacromial impingement 01/16/2019  ? Elevated LFTs 09/02/2018  ? Need for shingles vaccine 09/02/2018  ?  Healthcare maintenance 08/22/2018  ? Need for influenza vaccination 12/22/2017  ? 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/09/2017  ? Essential hypertension 04/07/2016  ? COLONIC POLYPS, HX OF 02/12/2009  ? Dyslipidemia 11/21/2007  ? Osteoarthritis 01/17/2007  ? NEPHROLITHIASIS, HX OF 01/17/2007  ? ?Past Medical History:  ?Diagnosis Date  ? Atypical chest pain 07/13/2019  ? Bilateral hearing loss 08/24/2019  ? Cataract   ? Cataract   ? Chronic kidney disease   ? kidney stones  ? Chronic pain of left knee 11/28/2019  ? COLONIC POLYPS, HX OF 02/12/2009  ? COVID-19 01/2020  ? Dyslipidemia 11/21/2007  ? Qualifier: Diagnosis of  By: Burnice Logan  MD, Doretha Sou   ?  Elevated LFTs 09/02/2018  ? Essential hypertension 04/07/2016  ? Treatment initiated February 2018  ? Follow-up examination after eye surgery 07/19/2019  ? Healthcare maintenance 08/22/2018  ? 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/09/2017  ? HYPERLIPIDEMIA 11/21/2007  ? Hypertension   ? Hypertriglyceridemia 08/15/2019  ? Left epiretinal membrane 07/19/2019  ? Need for influenza vaccination 12/22/2017  ? Need for shingles vaccine 09/02/2018  ? NEPHROLITHIASIS, HX OF 01/17/2007  ? OSTEOARTHRITIS 01/17/2007  ? Osteoarthritis 01/17/2007  ? Qualifier: Diagnosis of  By: Burnice Logan  MD, Doretha Sou   ? Posterior vitreous detachment of left eye 07/19/2019  ? Right epiretinal membrane 07/19/2019  ? Steatosis of liver 08/24/2019  ? Subacromial impingement 01/16/2019  ? ?Past Surgical History:  ?Procedure Laterality Date  ? APPENDECTOMY    ? CARPAL TUNNEL RELEASE Right   ? CATARACT EXTRACTION Bilateral   ? june 2019  ? HERNIA REPAIR    ? ingunial  ? KNEE SURGERY Right   ? x 4  ? MENISCUS REPAIR Left   ? knee; x 3  ? REPLACEMENT TOTAL KNEE Right   ? SHOULDER SURGERY    ? left  ? TOTAL KNEE ARTHROPLASTY    ? right  ? ?No Known Allergies ?Prior to Admission medications   ?Medication Sig Start Date End Date Taking? Authorizing Provider  ?lisinopril (ZESTRIL) 20 MG tablet Take 1 tablet (20 mg total) by mouth daily. Take 1 tablet (20 mg total) by mouth daily. **Needs appt before anymore refills given** 03/16/21  Yes Park Liter, MD  ?Multiple Vitamin (MULTIVITAMIN) tablet Take 1 tablet by mouth daily. Unknown strength   Yes [provider]  ?pantoprazole (PROTONIX) 40 MG tablet Take 1 tablet (40 mg total) by mouth daily. 10/10/20  Yes Irene Shipper, MD  ? ?Social History  ? ?Socioeconomic History  ? Marital status: Married  ?  Spouse name: Not on file  ? Number of children: Not on file  ? Years of education: Not on file  ? Highest education level: Not on file  ?Occupational History  ? Not on file  ?Tobacco Use  ? Smoking  status: Never  ? Smokeless tobacco: Current  ?  Types: Chew  ?Vaping Use  ? Vaping Use: Never used  ?Substance and Sexual Activity  ? Alcohol use: Yes  ?  Alcohol/week: 6.0 - 8.0 standard drinks  ?  Types: 6 - 8 Cans of beer per week  ? Drug use: No  ? Sexual activity: Not Currently  ?Other Topics Concern  ? Not on file  ?Social History Narrative  ? Not on file  ? ?Social Determinants of Health  ? ?Financial Resource Strain: Low Risk   ? Difficulty of Paying Living Expenses: Not hard at all  ?Food Insecurity: No Food Insecurity  ?  Worried About Charity fundraiser in the Last Year: Never true  ? Ran Out of Food in the Last Year: Never true  ?Transportation Needs: No Transportation Needs  ? Lack of Transportation (Medical): No  ? Lack of Transportation (Non-Medical): No  ?Physical Activity: Sufficiently Active  ? Days of Exercise per Week: 6 days  ? Minutes of Exercise per Session: 60 min  ?Stress: No Stress Concern Present  ? Feeling of Stress : Not at all  ?Social Connections: Socially Integrated  ? Frequency of Communication with Friends and Family: Twice a week  ? Frequency of Social Gatherings with Friends and Family: Twice a week  ? Attends Religious Services: More than 4 times per year  ? Active Member of Clubs or Organizations: Yes  ? Attends Archivist Meetings: 1 to 4 times per year  ? Marital Status: Married  ?Intimate Partner Violence: Not At Risk  ? Fear of Current or Ex-Partner: No  ? Emotionally Abused: No  ? Physically Abused: No  ? Sexually Abused: No  ? ? ?Review of Systems  ?Constitutional:  Negative for fatigue and unexpected weight change.  ?Eyes:  Negative for visual disturbance.  ?Respiratory:  Negative for cough, chest tightness and shortness of breath.   ?Cardiovascular:  Negative for chest pain, palpitations and leg swelling.  ?Gastrointestinal:  Negative for abdominal pain and blood in stool.  ?Neurological:  Negative for dizziness, light-headedness and headaches.  ? ? ?Objective:   ? ?Vitals:  ? 05/13/21 1511 05/13/21 1517  ?BP: (!) 150/78 (!) 142/76  ?Pulse: (!) 108   ?Resp: 17   ?Temp: 98.2 ?F (36.8 ?C)   ?TempSrc: Temporal   ?SpO2: 98%   ?Weight: 145 lb 12.8 oz (66.1 kg)   ?Height: 5

## 2021-05-13 NOTE — Patient Instructions (Addendum)
Keep a record of your blood pressures outside of the office and bring them to the next office visit. Bring meter to next visit.  ?If remaining over 130/80 follow up so we can discuss changes.  ?Based on last cholesterol and plaque in aorta, restart lipitor once per day. If new muscle aches follow up and we can discuss intermittent statin dosing. Recheck in 6 weeks and we can recheck fasting labs.  ?Mobic only if needed and short course, few days only if possible.  ?Can try taking protonix every other day to see if still effective.  ?Recheck in 6 weeks. Nice meeting you today.  ? ? ?Managing Your Hypertension ?Hypertension, also called high blood pressure, is when the force of the blood pressing against the walls of the arteries is too strong. Arteries are blood vessels that carry blood from your heart throughout your body. Hypertension forces the heart to work harder to pump blood and may cause the arteries to become narrow or stiff. ?Understanding blood pressure readings ?Your personal target blood pressure may vary depending on your medical conditions, your age, and other factors. A blood pressure reading includes a higher number over a lower number. Ideally, your blood pressure should be below 120/80. You should know that: ?The first, or top, number is called the systolic pressure. It is a measure of the pressure in your arteries as your heart beats. ?The second, or bottom number, is called the diastolic pressure. It is a measure of the pressure in your arteries as the heart relaxes. ?Blood pressure is classified into four stages. Based on your blood pressure reading, your health care provider may use the following stages to determine what type of treatment you need, if any. Systolic pressure and diastolic pressure are measured in a unit called mmHg. ?Normal ?Systolic pressure: below 852. ?Diastolic pressure: below 80. ?Elevated ?Systolic pressure: 778-242. ?Diastolic pressure: below 80. ?Hypertension stage  1 ?Systolic pressure: 353-614. ?Diastolic pressure: 43-15. ?Hypertension stage 2 ?Systolic pressure: 400 or above. ?Diastolic pressure: 90 or above. ?How can this condition affect me? ?Managing your hypertension is an important responsibility. Over time, hypertension can damage the arteries and decrease blood flow to important parts of the body, including the brain, heart, and kidneys. Having untreated or uncontrolled hypertension can lead to: ?A heart attack. ?A stroke. ?A weakened blood vessel (aneurysm). ?Heart failure. ?Kidney damage. ?Eye damage. ?Metabolic syndrome. ?Memory and concentration problems. ?Vascular dementia. ?What actions can I take to manage this condition? ?Hypertension can be managed by making lifestyle changes and possibly by taking medicines. Your health care provider will help you make a plan to bring your blood pressure within a normal range. ?Nutrition ? ?Eat a diet that is high in fiber and potassium, and low in salt (sodium), added sugar, and fat. An example eating plan is called the Dietary Approaches to Stop Hypertension (DASH) diet. To eat this way: ?Eat plenty of fresh fruits and vegetables. Try to fill one-half of your plate at each meal with fruits and vegetables. ?Eat whole grains, such as whole-wheat pasta, brown rice, or whole-grain bread. Fill about one-fourth of your plate with whole grains. ?Eat low-fat dairy products. ?Avoid fatty cuts of meat, processed or cured meats, and poultry with skin. Fill about one-fourth of your plate with lean proteins such as fish, chicken without skin, beans, eggs, and tofu. ?Avoid pre-made and processed foods. These tend to be higher in sodium, added sugar, and fat. ?Reduce your daily sodium intake. Most people with hypertension should eat  less than 1,500 mg of sodium a day. ?Lifestyle ? ?Work with your health care provider to maintain a healthy body weight or to lose weight. Ask what an ideal weight is for you. ?Get at least 30 minutes of  exercise that causes your heart to beat faster (aerobic exercise) most days of the week. Activities may include walking, swimming, or biking. ?Include exercise to strengthen your muscles (resistance exercise), such as weight lifting, as part of your weekly exercise routine. Try to do these types of exercises for 30 minutes at least 3 days a week. ?Do not use any products that contain nicotine or tobacco, such as cigarettes, e-cigarettes, and chewing tobacco. If you need help quitting, ask your health care provider. ?Control any long-term (chronic) conditions you have, such as high cholesterol or diabetes. ?Identify your sources of stress and find ways to manage stress. This may include meditation, deep breathing, or making time for fun activities. ?Alcohol use ?Do not drink alcohol if: ?Your health care provider tells you not to drink. ?You are pregnant, may be pregnant, or are planning to become pregnant. ?If you drink alcohol: ?Limit how much you use to: ?0-1 drink a day for women. ?0-2 drinks a day for men. ?Be aware of how much alcohol is in your drink. In the [place] one drink equals one 12 oz bottle of beer (355 mL), one 5 oz glass of wine (148 mL), or one 1? oz glass of hard liquor (44 mL). ?Medicines ?Your health care provider may prescribe medicine if lifestyle changes are not enough to get your blood pressure under control and if: ?Your systolic blood pressure is 130 or higher. ?Your diastolic blood pressure is 80 or higher. ?Take medicines only as told by your health care provider. Follow the directions carefully. Blood pressure medicines must be taken as told by your health care provider. The medicine does not work as well when you skip doses. Skipping doses also puts you at risk for problems. ?Monitoring ?Before you monitor your blood pressure: ?Do not smoke, drink caffeinated beverages, or exercise within 30 minutes before taking a measurement. ?Use the bathroom and empty your bladder (urinate). ?Sit  quietly for at least 5 minutes before taking measurements. ?Monitor your blood pressure at home as told by your health care provider. To do this: ?Sit with your back straight and supported. ?Place your feet flat on the floor. Do not cross your legs. ?Support your arm on a flat surface, such as a table. Make sure your upper arm is at heart level. ?Each time you measure, take two or three readings one minute apart and record the results. ?You may also need to have your blood pressure checked regularly by your health care provider. ?General information ?Talk with your health care provider about your diet, exercise habits, and other lifestyle factors that may be contributing to hypertension. ?Review all the medicines you take with your health care provider because there may be side effects or interactions. ?Keep all visits as told by your health care provider. Your health care provider can help you create and adjust your plan for managing your high blood pressure. ?Where to find more information ?National Heart, Lung, and Blood Institute: https://wilson-eaton.com/ ?American Heart Association: www.heart.org ?Contact a health care provider if: ?You think you are having a reaction to medicines you have taken. ?You have repeated (recurrent) headaches. ?You feel dizzy. ?You have swelling in your ankles. ?You have trouble with your vision. ?Get help right away if: ?You develop a severe  headache or confusion. ?You have unusual weakness or numbness, or you feel faint. ?You have severe pain in your chest or abdomen. ?You vomit repeatedly. ?You have trouble breathing. ?These symptoms may represent a serious problem that is an emergency. Do not wait to see if the symptoms will go away. Get medical help right away. Call your local emergency services (911 in the [place] Do not drive yourself to the hospital. ?Summary ?Hypertension is when the force of blood pumping through your arteries is too strong. If this condition is not controlled, it  may put you at risk for serious complications. ?Your personal target blood pressure may vary depending on your medical conditions, your age, and other factors. For most people, a normal blood pressure is less tha

## 2021-05-23 DIAGNOSIS — M25552 Pain in left hip: Secondary | ICD-10-CM | POA: Diagnosis not present

## 2021-05-23 DIAGNOSIS — M5451 Vertebrogenic low back pain: Secondary | ICD-10-CM | POA: Diagnosis not present

## 2021-06-24 ENCOUNTER — Ambulatory Visit (INDEPENDENT_AMBULATORY_CARE_PROVIDER_SITE_OTHER): Payer: Medicare Other | Admitting: Registered Nurse

## 2021-06-24 ENCOUNTER — Other Ambulatory Visit: Payer: Self-pay | Admitting: Family Medicine

## 2021-06-24 ENCOUNTER — Encounter: Payer: Self-pay | Admitting: Registered Nurse

## 2021-06-24 ENCOUNTER — Other Ambulatory Visit: Payer: Self-pay | Admitting: Registered Nurse

## 2021-06-24 ENCOUNTER — Encounter: Payer: Self-pay | Admitting: Family Medicine

## 2021-06-24 VITALS — BP 122/60 | HR 84 | Temp 98.2°F | Resp 16 | Wt 145.0 lb

## 2021-06-24 DIAGNOSIS — I700 Atherosclerosis of aorta: Secondary | ICD-10-CM | POA: Diagnosis not present

## 2021-06-24 DIAGNOSIS — I10 Essential (primary) hypertension: Secondary | ICD-10-CM

## 2021-06-24 DIAGNOSIS — R7989 Other specified abnormal findings of blood chemistry: Secondary | ICD-10-CM

## 2021-06-24 DIAGNOSIS — E871 Hypo-osmolality and hyponatremia: Secondary | ICD-10-CM

## 2021-06-24 DIAGNOSIS — E785 Hyperlipidemia, unspecified: Secondary | ICD-10-CM

## 2021-06-24 LAB — COMPREHENSIVE METABOLIC PANEL
ALT: 56 U/L — ABNORMAL HIGH (ref 0–53)
AST: 99 U/L — ABNORMAL HIGH (ref 0–37)
Albumin: 4.4 g/dL (ref 3.5–5.2)
Alkaline Phosphatase: 89 U/L (ref 39–117)
BUN: 8 mg/dL (ref 6–23)
CO2: 26 mEq/L (ref 19–32)
Calcium: 9.2 mg/dL (ref 8.4–10.5)
Chloride: 96 mEq/L (ref 96–112)
Creatinine, Ser: 0.83 mg/dL (ref 0.40–1.50)
GFR: 88.03 mL/min (ref >60.00)
Glucose, Bld: 118 mg/dL — ABNORMAL HIGH (ref 70–99)
Potassium: 4.5 mEq/L (ref 3.5–5.1)
Sodium: 130 mEq/L — ABNORMAL LOW (ref 135–145)
Total Bilirubin: 1.1 mg/dL (ref 0.2–1.2)
Total Protein: 7.2 g/dL (ref 6.0–8.3)

## 2021-06-24 LAB — LIPID PANEL
Cholesterol: 194 mg/dL (ref 0–200)
HDL: 42.6 mg/dL (ref >39.00)
NonHDL: 151.85
Total CHOL/HDL Ratio: 5
Triglycerides: 219 mg/dL — ABNORMAL HIGH (ref 0.0–149.0)
VLDL: 43.8 mg/dL — ABNORMAL HIGH (ref 0.0–40.0)

## 2021-06-24 LAB — LDL CHOLESTEROL, DIRECT: Direct LDL: 121 mg/dL

## 2021-06-24 NOTE — Progress Notes (Signed)
? ?  Established Patient Office Visit ? ?Subjective:  ?Patient ID: Timothy [name], male    DOB: 12/04/1949  Age: 71 y.o. MRN: [id number] ? ?CC:  ?Chief Complaint  ?Patient presents with  ? Hypertension  ? ? ?HPI ?Timothy [name] presents for htn, labs ? ?Hypertension: ?Patient Currently taking: lisinopril '20mg'$  po qd ?Good effect. No AEs. ?Denies CV symptoms including: chest pain, shob, doe, headache, visual changes, fatigue, claudication, and dependent edema.  ? ?Previous readings and labs: ?BP Readings from Last 3 Encounters:  ?06/24/21 122/60  ?05/13/21 (!) 142/76  ?03/16/21 (!) 162/82  ? ?Lab Results  ?Component Value Date  ? CREATININE 0.86 03/16/2021  ? ? ?Labs ?Hx of elevated LFTs, borderline elevated lipids with LDL 124 ?Hx of hyponatremia over previous years with low of 127. Last check in Jan 2023 showed resolution at 136.  ?Otherwise no changes. Will repeat LFTs and Lipids per PCP recommendation. ? ?Outpatient Medications Prior to Visit  ?Medication Sig Dispense Refill  ? lisinopril (ZESTRIL) 20 MG tablet Take 1 tablet (20 mg total) by mouth daily. Take 1 tablet (20 mg total) by mouth daily. **Needs appt before anymore refills given** 90 tablet 3  ? meloxicam (MOBIC) 7.5 MG tablet Take 1 tablet (7.5 mg total) by mouth daily as needed for pain. 30 tablet 0  ? Multiple Vitamin (MULTIVITAMIN) tablet Take 1 tablet by mouth daily. Unknown strength    ? pantoprazole (PROTONIX) 40 MG tablet Take 1 tablet (40 mg total) by mouth daily. 90 tablet 3  ? ?No facility-administered medications prior to visit.  ? ? ?Review of Systems ? ?  ?Objective:  ?  ? ?BP 122/60   Pulse 84   Temp 98.2 ?F (36.8 ?C)   Resp 16   Wt 145 lb (65.8 kg)   SpO2 99%   BMI 21.41 kg/m?  ? ?Wt Readings from Last 3 Encounters:  ?06/24/21 145 lb (65.8 kg)  ?05/13/21 145 lb 12.8 oz (66.1 kg)  ?03/16/21 145 lb (65.8 kg)  ? ?Physical Exam ? ?No results found for any visits on 06/24/21. ? ? ? ?The 10-year ASCVD risk score (Arnett DK, et  al., 2019) is: 19.2% ? ?  ?Assessment & Plan:  ? ?Problem List Items Addressed This Visit   ? ?  ? Cardiovascular and Mediastinum  ? Essential hypertension  ?  Well controlled on current regimen. Continue. Recheck in 3 mo ? ?  ?  ? Relevant Orders  ? Comprehensive metabolic panel  ? Lipid panel  ? Aortic atherosclerosis ([place])  ?  Recheck lipids and LFTs. HTN well controlled.  ? ?  ?  ? Relevant Orders  ? Comprehensive metabolic panel  ? Lipid panel  ?  ? Other  ? Hyperlipidemia - Primary  ?  Recheck labs today. Adjust management as indicated.  ? ?  ?  ? Relevant Orders  ? Comprehensive metabolic panel  ? Lipid panel  ? ? ?No orders of the defined types were placed in this encounter. ? ? ?Return in about 3 months (around 09/23/2021) for Chronic Conditions.  ? ? ?Maximiano Coss, NP ?

## 2021-06-24 NOTE — Progress Notes (Signed)
See labs.  We will have a lab only visit next few weeks to recheck hyponatremia and monitor LFTs given recent start of statin and slight increase from previous readings. ?

## 2021-06-24 NOTE — Assessment & Plan Note (Signed)
Well controlled on current regimen. Continue. Recheck in 3 mo ?

## 2021-06-24 NOTE — Patient Instructions (Signed)
Mr. Croom -  ? ?Great to meet you ? ?Call with concerns ? ?See you in 3 months to recheck labs and HTN ? ?Thanks, ? ?Rich  ?

## 2021-06-24 NOTE — Assessment & Plan Note (Signed)
Recheck lipids and LFTs. HTN well controlled.  ?

## 2021-06-24 NOTE — Assessment & Plan Note (Signed)
Recheck labs today. Adjust management as indicated.  ?

## 2021-06-24 NOTE — Progress Notes (Signed)
FYI on labs. He's following up with you in 3 mo. BP better today, LFTs up. Noted hepatic steatosis on CT abd/pel in late 2022, have ordered US RUQ given uptrend in LFTs. Pt made aware ? ?Thanks, ? ?Rich

## 2021-07-29 ENCOUNTER — Other Ambulatory Visit: Payer: Self-pay | Admitting: Internal Medicine

## 2021-09-24 ENCOUNTER — Ambulatory Visit: Payer: Medicare Other | Admitting: Family Medicine

## 2021-09-27 ENCOUNTER — Emergency Department (HOSPITAL_BASED_OUTPATIENT_CLINIC_OR_DEPARTMENT_OTHER): Payer: Medicare Other

## 2021-09-27 ENCOUNTER — Other Ambulatory Visit: Payer: Self-pay

## 2021-09-27 ENCOUNTER — Inpatient Hospital Stay (HOSPITAL_BASED_OUTPATIENT_CLINIC_OR_DEPARTMENT_OTHER)
Admission: EM | Admit: 2021-09-27 | Discharge: 2021-10-05 | DRG: 234 | Disposition: A | Discharge status: Home Health | Payer: Medicare Other | Attending: Cardiothoracic Surgery | Admitting: Cardiothoracic Surgery

## 2021-09-27 ENCOUNTER — Encounter (HOSPITAL_BASED_OUTPATIENT_CLINIC_OR_DEPARTMENT_OTHER): Payer: Self-pay | Admitting: Emergency Medicine

## 2021-09-27 DIAGNOSIS — I214 Non-ST elevation (NSTEMI) myocardial infarction: Principal | ICD-10-CM | POA: Diagnosis present

## 2021-09-27 DIAGNOSIS — I10 Essential (primary) hypertension: Secondary | ICD-10-CM

## 2021-09-27 DIAGNOSIS — I249 Acute ischemic heart disease, unspecified: Secondary | ICD-10-CM

## 2021-09-27 DIAGNOSIS — Z951 Presence of aortocoronary bypass graft: Secondary | ICD-10-CM

## 2021-09-27 DIAGNOSIS — N400 Enlarged prostate without lower urinary tract symptoms: Secondary | ICD-10-CM | POA: Diagnosis not present

## 2021-09-27 DIAGNOSIS — J939 Pneumothorax, unspecified: Secondary | ICD-10-CM | POA: Diagnosis not present

## 2021-09-27 DIAGNOSIS — Z6821 Body mass index (BMI) 21.0-21.9, adult: Secondary | ICD-10-CM | POA: Diagnosis not present

## 2021-09-27 DIAGNOSIS — I700 Atherosclerosis of aorta: Secondary | ICD-10-CM | POA: Diagnosis not present

## 2021-09-27 DIAGNOSIS — E781 Pure hyperglyceridemia: Secondary | ICD-10-CM | POA: Diagnosis present

## 2021-09-27 DIAGNOSIS — Z79899 Other long term (current) drug therapy: Secondary | ICD-10-CM | POA: Diagnosis not present

## 2021-09-27 DIAGNOSIS — D62 Acute posthemorrhagic anemia: Secondary | ICD-10-CM | POA: Diagnosis not present

## 2021-09-27 DIAGNOSIS — Z96651 Presence of right artificial knee joint: Secondary | ICD-10-CM | POA: Diagnosis present

## 2021-09-27 DIAGNOSIS — D689 Coagulation defect, unspecified: Secondary | ICD-10-CM | POA: Diagnosis not present

## 2021-09-27 DIAGNOSIS — I083 Combined rheumatic disorders of mitral, aortic and tricuspid valves: Secondary | ICD-10-CM | POA: Diagnosis not present

## 2021-09-27 DIAGNOSIS — Z01810 Encounter for preprocedural cardiovascular examination: Secondary | ICD-10-CM | POA: Diagnosis not present

## 2021-09-27 DIAGNOSIS — R110 Nausea: Secondary | ICD-10-CM | POA: Diagnosis not present

## 2021-09-27 DIAGNOSIS — Z87442 Personal history of urinary calculi: Secondary | ICD-10-CM | POA: Diagnosis not present

## 2021-09-27 DIAGNOSIS — Z86010 Personal history of colonic polyps: Secondary | ICD-10-CM

## 2021-09-27 DIAGNOSIS — I2510 Atherosclerotic heart disease of native coronary artery without angina pectoris: Secondary | ICD-10-CM | POA: Diagnosis not present

## 2021-09-27 DIAGNOSIS — I25110 Atherosclerotic heart disease of native coronary artery with unstable angina pectoris: Secondary | ICD-10-CM | POA: Diagnosis not present

## 2021-09-27 DIAGNOSIS — E871 Hypo-osmolality and hyponatremia: Secondary | ICD-10-CM | POA: Diagnosis not present

## 2021-09-27 DIAGNOSIS — R634 Abnormal weight loss: Secondary | ICD-10-CM | POA: Diagnosis present

## 2021-09-27 DIAGNOSIS — Z4682 Encounter for fitting and adjustment of non-vascular catheter: Secondary | ICD-10-CM | POA: Diagnosis not present

## 2021-09-27 DIAGNOSIS — Z8616 Personal history of COVID-19: Secondary | ICD-10-CM | POA: Diagnosis not present

## 2021-09-27 DIAGNOSIS — D696 Thrombocytopenia, unspecified: Secondary | ICD-10-CM | POA: Diagnosis not present

## 2021-09-27 DIAGNOSIS — E8770 Fluid overload, unspecified: Secondary | ICD-10-CM | POA: Diagnosis not present

## 2021-09-27 DIAGNOSIS — R079 Chest pain, unspecified: Secondary | ICD-10-CM | POA: Diagnosis not present

## 2021-09-27 DIAGNOSIS — Z800 Family history of malignant neoplasm of digestive organs: Secondary | ICD-10-CM | POA: Diagnosis not present

## 2021-09-27 DIAGNOSIS — J9811 Atelectasis: Secondary | ICD-10-CM | POA: Diagnosis not present

## 2021-09-27 DIAGNOSIS — D649 Anemia, unspecified: Secondary | ICD-10-CM | POA: Diagnosis not present

## 2021-09-27 DIAGNOSIS — R918 Other nonspecific abnormal finding of lung field: Secondary | ICD-10-CM | POA: Diagnosis not present

## 2021-09-27 DIAGNOSIS — I2584 Coronary atherosclerosis due to calcified coronary lesion: Secondary | ICD-10-CM | POA: Diagnosis present

## 2021-09-27 DIAGNOSIS — H9190 Unspecified hearing loss, unspecified ear: Secondary | ICD-10-CM | POA: Diagnosis not present

## 2021-09-27 DIAGNOSIS — J90 Pleural effusion, not elsewhere classified: Secondary | ICD-10-CM | POA: Diagnosis not present

## 2021-09-27 HISTORY — DX: Essential (primary) hypertension: I10

## 2021-09-27 LAB — COMPREHENSIVE METABOLIC PANEL
ALT: 84 U/L — ABNORMAL HIGH (ref 0–44)
AST: 128 U/L — ABNORMAL HIGH (ref 15–41)
Albumin: 4.4 g/dL (ref 3.5–5.0)
Alkaline Phosphatase: 83 U/L (ref 38–126)
Anion gap: 10 (ref 5–15)
BUN: 9 mg/dL (ref 8–23)
CO2: 22 mmol/L (ref 22–32)
Calcium: 9.6 mg/dL (ref 8.9–10.3)
Chloride: 99 mmol/L (ref 98–111)
Creatinine, Ser: 0.82 mg/dL (ref 0.61–1.24)
GFR, Estimated: >60 mL/min (ref >60)
Glucose, Bld: 138 mg/dL — ABNORMAL HIGH (ref 70–99)
Potassium: 4 mmol/L (ref 3.5–5.1)
Sodium: 131 mmol/L — ABNORMAL LOW (ref 135–145)
Total Bilirubin: 1.1 mg/dL (ref 0.3–1.2)
Total Protein: 7.8 g/dL (ref 6.5–8.1)

## 2021-09-27 LAB — HEPARIN LEVEL (UNFRACTIONATED): Heparin Unfractionated: 0.22 IU/mL — ABNORMAL LOW (ref 0.30–0.70)

## 2021-09-27 LAB — CBC WITH DIFFERENTIAL/PLATELET
Abs Immature Granulocytes: 0.01 10*3/uL (ref 0.00–0.07)
Basophils Absolute: 0 10*3/uL (ref 0.0–0.1)
Basophils Relative: 1 %
Eosinophils Absolute: 0 10*3/uL (ref 0.0–0.5)
Eosinophils Relative: 1 %
HCT: 36.2 % — ABNORMAL LOW (ref 39.0–52.0)
Hemoglobin: 13.1 g/dL (ref 13.0–17.0)
Immature Granulocytes: 0 %
Lymphocytes Relative: 40 %
Lymphs Abs: 2.1 10*3/uL (ref 0.7–4.0)
MCH: 35.3 pg — ABNORMAL HIGH (ref 26.0–34.0)
MCHC: 36.2 g/dL — ABNORMAL HIGH (ref 30.0–36.0)
MCV: 97.6 fL (ref 80.0–100.0)
Monocytes Absolute: 0.5 10*3/uL (ref 0.1–1.0)
Monocytes Relative: 10 %
Neutro Abs: 2.6 10*3/uL (ref 1.7–7.7)
Neutrophils Relative %: 48 %
Platelets: 104 10*3/uL — ABNORMAL LOW (ref 150–400)
RBC: 3.71 MIL/uL — ABNORMAL LOW (ref 4.22–5.81)
RDW: 11.9 % (ref 11.5–15.5)
WBC: 5.3 10*3/uL (ref 4.0–10.5)
nRBC: 0 % (ref 0.0–0.2)

## 2021-09-27 LAB — TROPONIN I (HIGH SENSITIVITY)
Troponin I (High Sensitivity): 111 ng/L (ref <18)
Troponin I (High Sensitivity): 243 ng/L (ref <18)
Troponin I (High Sensitivity): 1052 ng/L (ref <18)
Troponin I (High Sensitivity): 1592 ng/L (ref <18)

## 2021-09-27 LAB — PROTIME-INR
INR: 1.1 (ref 0.8–1.2)
Prothrombin Time: 14 seconds (ref 11.4–15.2)

## 2021-09-27 LAB — D-DIMER, QUANTITATIVE: D-Dimer, Quant: 0.31 ug/mL-FEU (ref 0.00–0.50)

## 2021-09-27 LAB — HIV ANTIBODY (ROUTINE TESTING W REFLEX): HIV Screen 4th Generation wRfx: NONREACTIVE

## 2021-09-27 MED ORDER — SODIUM CHLORIDE 0.9% FLUSH: 3.0000 mL | INTRAVENOUS | Status: DC | PRN

## 2021-09-27 MED ORDER — SODIUM CHLORIDE 0.9 % WEIGHT BASED INFUSION: 1.0000 mL/kg/h | INTRAVENOUS | Status: DC

## 2021-09-27 MED ORDER — METOPROLOL TARTRATE 25 MG PO TABS
25.0000 mg | ORAL_TABLET | Freq: Two times a day (BID) | ORAL | Status: DC
  Administered 2021-09-27 – 2021-09-29 (×5): 25 mg via ORAL
  Filled 2021-09-27 (×5): qty 1

## 2021-09-27 MED ORDER — SODIUM CHLORIDE 0.9 % WEIGHT BASED INFUSION
3.0000 mL/kg/h | INTRAVENOUS | Status: DC
  Administered 2021-09-28: 3 mL/kg/h via INTRAVENOUS

## 2021-09-27 MED ORDER — ONDANSETRON HCL 4 MG/2ML IJ SOLN: 4.0000 mg | Freq: Four times a day (QID) | INTRAMUSCULAR | Status: DC | PRN

## 2021-09-27 MED ORDER — NITROGLYCERIN 0.4 MG SL SUBL
0.4000 mg | SUBLINGUAL_TABLET | SUBLINGUAL | Status: DC | PRN
  Administered 2021-09-28 – 2021-09-29 (×2): 0.4 mg via SUBLINGUAL
  Filled 2021-09-27 (×3): qty 1

## 2021-09-27 MED ORDER — ATORVASTATIN CALCIUM 80 MG PO TABS
80.0000 mg | ORAL_TABLET | Freq: Every day | ORAL | Status: DC
  Administered 2021-09-27 – 2021-10-05 (×8): 80 mg via ORAL
  Filled 2021-09-27 (×8): qty 1

## 2021-09-27 MED ORDER — HEPARIN BOLUS VIA INFUSION
1000.0000 [IU] | Freq: Once | INTRAVENOUS | Status: AC
Start: 2021-09-27 — End: 2021-09-27
  Administered 2021-09-27: 1000 [IU] via INTRAVENOUS
  Filled 2021-09-27: qty 1000

## 2021-09-27 MED ORDER — NITROGLYCERIN 2 % TD OINT
0.5000 [in_us] | TOPICAL_OINTMENT | Freq: Once | TRANSDERMAL | Status: AC
  Administered 2021-09-27: 0.5 [in_us] via TOPICAL
  Filled 2021-09-27: qty 1

## 2021-09-27 MED ORDER — ASPIRIN 81 MG PO CHEW
324.0000 mg | CHEWABLE_TABLET | Freq: Once | ORAL | Status: AC
Start: 2021-09-27 — End: 2021-09-27
  Administered 2021-09-27: 324 mg via ORAL
  Filled 2021-09-27: qty 4

## 2021-09-27 MED ORDER — PANTOPRAZOLE SODIUM 40 MG PO TBEC
40.0000 mg | DELAYED_RELEASE_TABLET | Freq: Every day | ORAL | Status: DC
  Administered 2021-09-27 – 2021-09-29 (×3): 40 mg via ORAL
  Filled 2021-09-27 (×3): qty 1

## 2021-09-27 MED ORDER — ACETAMINOPHEN 325 MG PO TABS: 650.0000 mg | ORAL_TABLET | ORAL | Status: DC | PRN

## 2021-09-27 MED ORDER — SODIUM CHLORIDE 0.9% FLUSH: 3.0000 mL | Freq: Two times a day (BID) | INTRAVENOUS | Status: DC

## 2021-09-27 MED ORDER — SODIUM CHLORIDE 0.9 % IV SOLN: 250.0000 mL | INTRAVENOUS | Status: DC | PRN

## 2021-09-27 MED ORDER — ASPIRIN 81 MG PO CHEW
81.0000 mg | CHEWABLE_TABLET | ORAL | Status: AC
  Administered 2021-09-28: 81 mg via ORAL
  Filled 2021-09-27: qty 1

## 2021-09-27 MED ORDER — HEPARIN BOLUS VIA INFUSION
4000.0000 [IU] | Freq: Once | INTRAVENOUS | Status: AC
  Administered 2021-09-27: 4000 [IU] via INTRAVENOUS

## 2021-09-27 MED ORDER — NITROGLYCERIN 0.4 MG SL SUBL
0.4000 mg | SUBLINGUAL_TABLET | SUBLINGUAL | Status: DC | PRN
  Administered 2021-09-27 (×2): 0.4 mg via SUBLINGUAL
  Filled 2021-09-27: qty 1

## 2021-09-27 MED ORDER — ZOLPIDEM TARTRATE 5 MG PO TABS: 5.0000 mg | ORAL_TABLET | Freq: Every evening | ORAL | Status: DC | PRN

## 2021-09-27 MED ORDER — NITROGLYCERIN IN D5W 200-5 MCG/ML-% IV SOLN
0.0000 ug/min | INTRAVENOUS | Status: DC
  Administered 2021-09-27: 5 ug/min via INTRAVENOUS
  Administered 2021-09-28: 15 ug/min via INTRAVENOUS
  Filled 2021-09-27 (×2): qty 250

## 2021-09-27 MED ORDER — ALPRAZOLAM 0.25 MG PO TABS: 0.2500 mg | ORAL_TABLET | Freq: Two times a day (BID) | ORAL | Status: DC | PRN

## 2021-09-27 MED ORDER — HEPARIN (PORCINE) 25000 UT/250ML-% IV SOLN
900.0000 [IU]/h | INTRAVENOUS | Status: DC
  Administered 2021-09-27: 800 [IU]/h via INTRAVENOUS
  Filled 2021-09-27: qty 250

## 2021-09-27 NOTE — ED Notes (Signed)
Rn called the floor to make sure they took patients on nitro drip [place] states that they do

## 2021-09-27 NOTE — ED Triage Notes (Signed)
Pt arrives pov, c/o squeezing CP with nausea  x 4 days

## 2021-09-27 NOTE — H&P (Signed)
Cardiology Admission History and Physical:   Patient ID: Timothy [name] MRN: [id number]; DOB: Apr 07, 1950   Admission date: 09/27/2021  PCP:  Doreene Nest, MD   [facility] Providers Cardiologist:  Lajuana Ripple here to update MD or APP on Care Team, Refresh:1}     Chief Complaint:  chest pain  Patient Profile:   Timothy [name] is a 71 y.o. male with hypertension, hyperlipidemia who is being seen 09/27/2021 for the evaluation of non-STEMI.  History of Present Illness:   Timothy [name] has a history of hypertension and hyperlipidemia.  He has follow-up with general cardiology.  He developed chest discomfort approximately 1 month ago.  Over the past few days ago, his chest discomfort worsened.  He initially thought that he was having more chest pain related to eating certain foods, but as the pain worsened, he realized that did not have an association with meals.  His pain does worsen when he exerts himself and gets better at rest.  He has at times had pain at rest.  His pain is worse towards the end of the day.  He presented to the emergency room with pain this morning.  The pain occurred and lasted for 2 hours.  While in emergency room, he did have another episode of chest pain with an ECG showing ST depressions in the inferior leads.  He is currently chest pain-free.  He is on IV heparin and nitroglycerin.   Past Medical History:  Diagnosis Date   Hypertension     Past Surgical History:  Procedure Laterality Date   JOINT REPLACEMENT       Medications Prior to Admission: Prior to Admission medications   Medication Sig Start Date End Date Taking? Authorizing Provider  pantoprazole (PROTONIX) 40 MG tablet TAKE 1 TABLET BY MOUTH EVERY DAY 04/21/20   Kennyth Arnold, FNP     Allergies:   Not on File  Social History:   Social History   Socioeconomic History   Marital status: Married    Spouse name: Not on file   Number of children: Not on file   Years of education:  Not on file   Highest education level: Not on file  Occupational History   Not on file  Tobacco Use   Smoking status: Never   Smokeless tobacco: Not on file  Substance and Sexual Activity   Alcohol use: Yes   Drug use: Never   Sexual activity: Not on file  Other Topics Concern   Not on file  Social History Narrative   Not on file   Social Determinants of Health   Financial Resource Strain: Not on file  Food Insecurity: Not on file  Transportation Needs: Not on file  Physical Activity: Not on file  Stress: Not on file  Social Connections: Not on file  Intimate Partner Violence: Not on file    Family History:   The patient's family history includes Colon cancer (age of onset: 1) in his father. There is no history of Esophageal cancer, Rectal cancer, Stomach cancer, Pancreatic cancer, or Liver disease.  ROS:  Please see the history of present illness.  All other ROS reviewed and negative.     Physical Exam/Data:   Vitals:   09/27/21 1251 09/27/21 1300 09/27/21 1412 09/27/21 1421  BP:  (!) 151/81  (!) 141/87  Pulse:  93  95  Resp:  13  15  Temp: 98.4 F (36.9 C)   98 F (36.7 C)  TempSrc: Oral  Oral  SpO2:  100%  100%  Weight:   63.6 kg   Height:   '5\' 10"'$  (1.778 m)    No intake or output data in the 24 hours ending 09/27/21 1452    09/27/2021    2:12 PM 09/27/2021    9:58 AM  Last 3 Weights  Weight (lbs) 140 lb 3.2 oz 150 lb  Weight (kg) 63.594 kg 68.04 kg     Body mass index is 20.12 kg/m.  General:  Well nourished, well developed, in no acute distress HEENT: normal Neck: no JVD Vascular: No carotid bruits; Distal pulses 2+ bilaterally   Cardiac:  normal S1, S2; RRR; no murmur  Lungs:  clear to auscultation bilaterally, no wheezing, rhonchi or rales  Abd: soft, nontender, no hepatomegaly  Ext: no edema Musculoskeletal:  No deformities, BUE and BLE strength normal and equal Skin: warm and dry  Neuro:  CNs 2-12 intact, no focal abnormalities  noted Psych:  Normal affect    EKG:  The ECG that was done  was personally reviewed and demonstrates sinus rhythm  Relevant CV Studies: Myoview 2021 Nuclear stress EF: 69%. There was no ST segment deviation noted during stress. No T wave inversion was noted during stress. The study is normal. This is a low risk study. The left ventricular ejection fraction is hyperdynamic (>65%).  Laboratory Data:  High Sensitivity Troponin:   Recent Labs  Lab 09/27/21 1001 09/27/21 1204  TROPONINIHS 111* 243*      Chemistry Recent Labs  Lab 09/27/21 1001  NA 131*  K 4.0  CL 99  CO2 22  GLUCOSE 138*  BUN 9  CREATININE 0.82  CALCIUM 9.6  GFRNONAA >60  ANIONGAP 10    Recent Labs  Lab 09/27/21 1001  PROT 7.8  ALBUMIN 4.4  AST 128*  ALT 84*  ALKPHOS 83  BILITOT 1.1   Lipids No results for input(s): "CHOL", "TRIG", "HDL", "LABVLDL", "LDLCALC", "CHOLHDL" in the last 168 hours. Hematology Recent Labs  Lab 09/27/21 1001  WBC 5.3  RBC 3.71*  HGB 13.1  HCT 36.2*  MCV 97.6  MCH 35.3*  MCHC 36.2*  RDW 11.9  PLT 104*   Thyroid No results for input(s): "TSH", "FREET4" in the last 168 hours. BNPNo results for input(s): "BNP", "PROBNP" in the last 168 hours.  DDimer  Recent Labs  Lab 09/27/21 1001  DDIMER 0.31     Radiology/Studies:  DG Chest Portable 1 View  Result Date: 09/27/2021 CLINICAL DATA:  Squeezing chest pain with nausea since 4 days EXAM: PORTABLE CHEST 1 VIEW COMPARISON:  None Available. FINDINGS: The heart size and mediastinal contours are within normal limits. Both lungs are clear. The visualized skeletal structures are unremarkable. IMPRESSION: No active disease. Electronically Signed   By: Frazier Richards M.D.   On: 09/27/2021 10:48     Assessment and Plan:   Non-STEMI: Patient does not have a history of coronary artery disease.  He had a Myoview in 2021 that showed no evidence of ischemia.  He had chest pain associated with ST depressions and elevated  troponin.  He is currently on IV nitroglycerin and heparin.  We Timothy [name] continue these medications and plan for left heart catheterization tomorrow.  We Timothy [name] check fasting lipids. The patient understands that risks include but are not limited to stroke (1 in 1000), death (1 in 68), kidney failure [usually temporary] (1 in 500), bleeding (1 in 200), allergic reaction [possibly serious] (1 in 200), and agrees to proceed.  Hypertension: Mildly elevated today.  We Brigit Doke need to adjust medications based on left heart catheterization and echo tomorrow.   For questions or updates, please contact [place] Please consult [email or web address] for contact info under     Signed, Mavryk Pino Meredith Leeds, MD  09/27/2021 2:52 PM

## 2021-09-27 NOTE — ED Notes (Signed)
Nitro ointment patch was take off the patient at 1320 due to nitro drip being started.

## 2021-09-27 NOTE — Progress Notes (Signed)
ANTICOAGULATION CONSULT NOTE - Initial Consult  Pharmacy Consult for Heparin Indication: chest pain/ACS  Not on File  Patient Measurements: Height: '5\' 10"'$  (177.8 cm) Weight: 68 kg (150 lb) IBW/kg (Calculated) : 73 Heparin Dosing Weight: 68 kg  Vital Signs: Temp: 97.9 F (36.6 C) (07/30 1000) Temp Source: Oral (07/30 1000) BP: 140/81 (07/30 1130) Pulse Rate: 89 (07/30 1130)  Labs: Recent Labs    09/27/21 1001  HGB 13.1  HCT 36.2*  PLT 104*  CREATININE 0.82  TROPONINIHS 111*    Estimated Creatinine Clearance: 80.6 mL/min (by C-G formula based on SCr of 0.82 mg/dL).   Medical History: Past Medical History:  Diagnosis Date   Hypertension     Medications:  (Not in a hospital admission)  Scheduled:  Infusions:  PRN: nitroGLYCERIN  Assessment: 39 yom with presenting with chest pain. Heparin per pharmacy consult placed for chest pain/ACS.  Patient is not on anticoagulation prior to arrival.  Hgb 13.1; plt 104  Goal of Therapy:  Heparin level 0.3-0.7 units/ml Monitor platelets by anticoagulation protocol: Yes   Plan:  Give IV heparin 4000 units bolus x 1 Start heparin infusion at 800 units/hr Check anti-Xa level in 6 hours and daily while on heparin Continue to monitor H&H and platelets  Lorelei Pont, PharmD, BCPS 09/27/2021 11:48 AM ED Clinical Pharmacist -  [phone]

## 2021-09-27 NOTE — ED Notes (Signed)
Scan Nitro drip for [facility]. They told me to chart that they were going to use [facility] protcol call for the drip. States that they can over ride the orders on the Mar.

## 2021-09-27 NOTE — ED Notes (Signed)
Patient states that he has pain in his central chest. States that it feels as if someone is squeezing his heart. Alert x4 Denies any shortness of breath . Breathing non labored

## 2021-09-27 NOTE — ED Provider Notes (Addendum)
[facility] EMERGENCY DEPARTMENT Provider Note   CSN: [id number] Arrival date & time: 09/27/21  6195     History  Chief Complaint  Patient presents with   Chest Pain    Timothy [name] is a 71 y.o. male.  Presenting to the emergency room due to concern for chest pain.  Patient reports that he has been dealing with intermittent episodes of chest pain over the past few months to couple years.  Has been worse over the past few days however.  Has been having a couple episodes of pain a day, lasting up to an hour or 2 in length.  Described as pressure sensation across his chest, at times has slight radiation in between his shoulder blades.  Had an episode of pain starting this morning at about 8:00 and resolved just before he came to the ER at around 10:00.  He currently is pain-free.  He denies any major medical problems.  HPI     Home Medications Prior to Admission medications   Medication Sig Start Date End Date Taking? Authorizing Provider  pantoprazole (PROTONIX) 40 MG tablet TAKE 1 TABLET BY MOUTH EVERY DAY 04/21/20   Kennyth Arnold, FNP      Allergies    Patient has no allergy information on record.    Review of Systems   Review of Systems  Constitutional:  Negative for chills and fever.  HENT:  Negative for ear pain and sore throat.   Eyes:  Negative for pain and visual disturbance.  Respiratory:  Negative for cough and shortness of breath.   Cardiovascular:  Positive for chest pain. Negative for palpitations.  Gastrointestinal:  Negative for abdominal pain and vomiting.  Genitourinary:  Negative for dysuria and hematuria.  Musculoskeletal:  Negative for arthralgias and back pain.  Skin:  Negative for color change and rash.  Neurological:  Negative for seizures and syncope.  All other systems reviewed and are negative.   Physical Exam Updated Vital Signs BP 140/81   Pulse 89   Temp 97.9 F (36.6 C) (Oral)   Resp 12   Ht '5\' 10"'$  (1.778 m)   Wt 68 kg    SpO2 99%   BMI 21.52 kg/m  Physical Exam Vitals and nursing note reviewed.  Constitutional:      General: He is not in acute distress.    Appearance: He is well-developed.  HENT:     Head: Normocephalic and atraumatic.  Eyes:     Conjunctiva/sclera: Conjunctivae normal.  Cardiovascular:     Rate and Rhythm: Normal rate and regular rhythm.     Heart sounds: No murmur heard. Pulmonary:     Effort: Pulmonary effort is normal. No respiratory distress.     Breath sounds: Normal breath sounds.  Abdominal:     Palpations: Abdomen is soft.     Tenderness: There is no abdominal tenderness.  Musculoskeletal:        General: No swelling.     Cervical back: Neck supple.  Skin:    General: Skin is warm and dry.     Capillary Refill: Capillary refill takes less than 2 seconds.  Neurological:     Mental Status: He is alert.  Psychiatric:        Mood and Affect: Mood normal.     ED Results / Procedures / Treatments   Labs (all labs ordered are listed, but only abnormal results are displayed) Labs Reviewed  COMPREHENSIVE METABOLIC PANEL - Abnormal; Notable for the following components:  Result Value   Sodium 131 (*)    Glucose, Bld 138 (*)    AST 128 (*)    ALT 84 (*)    All other components within normal limits  CBC WITH DIFFERENTIAL/PLATELET - Abnormal; Notable for the following components:   RBC 3.71 (*)    HCT 36.2 (*)    MCH 35.3 (*)    MCHC 36.2 (*)    Platelets 104 (*)    All other components within normal limits  TROPONIN I (HIGH SENSITIVITY) - Abnormal; Notable for the following components:   Troponin I (High Sensitivity) 111 (*)    All other components within normal limits  D-DIMER, QUANTITATIVE  HEPARIN LEVEL (UNFRACTIONATED)  PROTIME-INR  TROPONIN I (HIGH SENSITIVITY)    EKG EKG Interpretation  Date/Time:  Sunday September 27 2021 11:47:15 EDT Ventricular Rate:  98 PR Interval:  167 QRS Duration: 87 QT Interval:  349 QTC Calculation: 446 R  Axis:   76 Text Interpretation: Sinus rhythm Probable left atrial enlargement Anteroseptal infarct, age indeterminate ST depressions in V4, V5 and II and II now present, new from prior Confirmed by Madalyn Rob [phone]) on 09/27/2021 11:55:02 AM  Radiology DG Chest Portable 1 View  Result Date: 09/27/2021 CLINICAL DATA:  Squeezing chest pain with nausea since 4 days EXAM: PORTABLE CHEST 1 VIEW COMPARISON:  None Available. FINDINGS: The heart size and mediastinal contours are within normal limits. Both lungs are clear. The visualized skeletal structures are unremarkable. IMPRESSION: No active disease. Electronically Signed   By: Frazier Richards M.D.   On: 09/27/2021 10:48    Procedures .Critical Care  Performed by: Lucrezia Starch, MD Authorized by: Lucrezia Starch, MD   Critical care provider statement:    Critical care time (minutes):  39   Critical care was necessary to treat or prevent imminent or life-threatening deterioration of the following conditions:  Cardiac failure   Critical care was time spent personally by me on the following activities:  Development of treatment plan with patient or surrogate, discussions with consultants, evaluation of patient's response to treatment, examination of patient, ordering and review of laboratory studies, ordering and review of radiographic studies, ordering and performing treatments and interventions, pulse oximetry, re-evaluation of patient's condition and review of old charts     Medications Ordered in ED Medications  nitroGLYCERIN (NITROSTAT) SL tablet 0.4 mg (0.4 mg Sublingual Given 09/27/21 1145)  heparin bolus via infusion 4,000 Units (has no administration in time range)  heparin ADULT infusion 100 units/mL (25000 units/258m) (has no administration in time range)  aspirin chewable tablet 324 mg (324 mg Oral Given 09/27/21 1130)    ED Course/ Medical Decision Making/ A&P                           Medical Decision Making Amount  and/or Complexity of Data Reviewed Labs: ordered. Radiology: ordered.  Risk OTC drugs. Prescription drug management. Decision regarding hospitalization.   71year old gentleman with history of hypertension, hyperlipidemia presenting to ER due to concern for chest pain/pressure.  When I initially evaluated him he appeared well in no distress.  His initial EKG did not demonstrate any acute ischemic change.  No STEMI.  Initial troponin however was elevated.  D-dimer was within normal limits, doubt PE.  Suspect ACS, concern for NSTEMI.  Provided aspirin, started heparin and consulted cardiology.  I discussed the case with Dr. CCurt [facility]ho [facility].  Patient then developed an episode of chest pressure, provided sublingual nitro, repeat EKG demonstrating some ST depressions. Thankfully pain readily resolved with nitro. Updated [place]. He rec given some nitro paste for now or starting nitro gtt. Given how readily his pain was controlled with a dose of nitro, will just do paste for now. Wife updated throughout stay.    Addendum -[facility] arrived to transport patient, I reassessed him again, he just started having another chest pain episode despite the Nitropaste.  We will go ahead and start a nitroglycerin drip and give him a sublingual nitro at this time.       Final Clinical Impression(s) / ED Diagnoses Final diagnoses:  NSTEMI (non-ST elevated myocardial infarction) ([place])  Acute coronary syndrome [facility])    Rx / DC Orders ED Discharge Orders     None         Lucrezia Starch, MD 09/27/21 1157    Lucrezia Starch, MD 09/27/21 1319

## 2021-09-27 NOTE — Progress Notes (Signed)
ANTICOAGULATION CONSULT NOTE - Initial Consult  Pharmacy Consult for Heparin Indication: chest pain/ACS  No Known Allergies  Patient Measurements: Height: '5\' 10"'$  (177.8 cm) Weight: 63.6 kg (140 lb 3.2 oz) IBW/kg (Calculated) : 73 Heparin Dosing Weight: 68 kg  Vital Signs: Temp: 98 F (36.7 C) (07/30 1421) Temp Source: Oral (07/30 1421) BP: 141/87 (07/30 1421) Pulse Rate: 95 (07/30 1421)  Labs: Recent Labs    09/27/21 1001 09/27/21 1204 09/27/21 1802 09/27/21 1824  HGB 13.1  --   --   --   HCT 36.2*  --   --   --   PLT 104*  --   --   --   LABPROT 14.0  --   --   --   INR 1.1  --   --   --   HEPARINUNFRC  --   --   --  0.22*  CREATININE 0.82  --   --   --   TROPONINIHS 111* 243* 1,052*  --      Estimated Creatinine Clearance: 75.4 mL/min (by C-G formula based on SCr of 0.82 mg/dL).   Medical History: Past Medical History:  Diagnosis Date   Hypertension     Medications:  Medications Prior to Admission  Medication Sig Dispense Refill Last Dose   lisinopril (ZESTRIL) 20 MG tablet Take 1 tablet by mouth daily.   09/26/2021   pantoprazole (PROTONIX) 40 MG tablet TAKE 1 TABLET BY MOUTH EVERY DAY (Patient taking differently: Take 40 mg by mouth daily.) 90 tablet 1 09/26/2021   triamcinolone cream (KENALOG) 0.1 % Apply 1 Application topically daily as needed (For rash).   Past Week   triamcinolone ointment (KENALOG) 0.1 % Apply 1 Application topically daily as needed (For rash).   Past Week    Scheduled:   [START ON 09/28/2021] aspirin  81 mg Oral Pre-Cath   atorvastatin  80 mg Oral Daily   metoprolol tartrate  25 mg Oral BID   pantoprazole  40 mg Oral Daily   sodium chloride flush  3 mL Intravenous Q12H   Infusions:   sodium chloride     [START ON 09/28/2021] sodium chloride     Followed by   Derrill Memo ON 09/28/2021] sodium chloride     heparin 800 Units/hr (09/27/21 1229)   nitroGLYCERIN 5 mcg/min (09/27/21 1324)   PRN: sodium chloride, acetaminophen,  ALPRAZolam, nitroGLYCERIN, ondansetron (ZOFRAN) IV, sodium chloride flush, zolpidem  Assessment: 70 yom with presenting with chest pain. Heparin per pharmacy consult placed for chest pain/ACS. Patient was not on anticoagulation prior to arrival.  Heparin started at 800 units/hr following 4000 unit IV bolus. Heparin level is back at 0.22 which is sub-therapeutic.  No issues with infusion or bleeding per RN  Hgb 13.1; plt 104  Goal of Therapy:  Heparin level 0.3-0.7 units/ml Monitor platelets by anticoagulation protocol: Yes   Plan:  Give IV heparin 1000 units bolus x 1 Increase heparin infusion to 900 units/hr Check anti-Xa level in 6 hours and daily while on heparin Continue to monitor H&H and platelets  Lorelei Pont, PharmD, BCPS 09/27/2021 7:23 PM ED Clinical Pharmacist -  [phone]

## 2021-09-27 NOTE — ED Notes (Signed)
Nitro patch  on left chest

## 2021-09-27 NOTE — ED Notes (Signed)
ED Provider at bedside. 

## 2021-09-28 ENCOUNTER — Inpatient Hospital Stay (HOSPITAL_COMMUNITY): Payer: Medicare Other

## 2021-09-28 ENCOUNTER — Other Ambulatory Visit (HOSPITAL_COMMUNITY): Payer: Medicare Other

## 2021-09-28 ENCOUNTER — Encounter (HOSPITAL_COMMUNITY): Payer: Self-pay | Admitting: Cardiovascular Disease

## 2021-09-28 ENCOUNTER — Encounter (HOSPITAL_COMMUNITY)
Admission: EM | Disposition: A | Discharge status: Home Health | Payer: Self-pay | Source: Home / Self Care | Attending: Cardiothoracic Surgery

## 2021-09-28 DIAGNOSIS — Z01810 Encounter for preprocedural cardiovascular examination: Secondary | ICD-10-CM | POA: Diagnosis not present

## 2021-09-28 DIAGNOSIS — I214 Non-ST elevation (NSTEMI) myocardial infarction: Secondary | ICD-10-CM | POA: Diagnosis not present

## 2021-09-28 DIAGNOSIS — I2510 Atherosclerotic heart disease of native coronary artery without angina pectoris: Secondary | ICD-10-CM

## 2021-09-28 HISTORY — PX: LEFT HEART CATH AND CORONARY ANGIOGRAPHY: CATH118249

## 2021-09-28 LAB — COMPREHENSIVE METABOLIC PANEL
ALT: 66 U/L — ABNORMAL HIGH (ref 0–44)
AST: 89 U/L — ABNORMAL HIGH (ref 15–41)
Albumin: 3.7 g/dL (ref 3.5–5.0)
Alkaline Phosphatase: 68 U/L (ref 38–126)
Anion gap: 9 (ref 5–15)
BUN: 7 mg/dL — ABNORMAL LOW (ref 8–23)
CO2: 20 mmol/L — ABNORMAL LOW (ref 22–32)
Calcium: 9.1 mg/dL (ref 8.9–10.3)
Chloride: 105 mmol/L (ref 98–111)
Creatinine, Ser: 0.82 mg/dL (ref 0.61–1.24)
GFR, Estimated: >60 mL/min (ref >60)
Glucose, Bld: 130 mg/dL — ABNORMAL HIGH (ref 70–99)
Potassium: 3.8 mmol/L (ref 3.5–5.1)
Sodium: 134 mmol/L — ABNORMAL LOW (ref 135–145)
Total Bilirubin: 2.4 mg/dL — ABNORMAL HIGH (ref 0.3–1.2)
Total Protein: 6.4 g/dL — ABNORMAL LOW (ref 6.5–8.1)

## 2021-09-28 LAB — ECHOCARDIOGRAM COMPLETE
AR max vel: 2.05 cm2
AV Area VTI: 2.1 cm2
AV Area mean vel: 1.95 cm2
AV Mean grad: 6 mmHg
AV Peak grad: 10.4 mmHg
Ao pk vel: 1.61 m/s
Area-P 1/2: 4.06 cm2
Height: 70 in
S' Lateral: 2.8 cm
Weight: 2243.2 oz

## 2021-09-28 LAB — LIPID PANEL
Cholesterol: 146 mg/dL (ref 0–200)
HDL: 40 mg/dL — ABNORMAL LOW (ref >40)
LDL Cholesterol: 80 mg/dL (ref 0–99)
Total CHOL/HDL Ratio: 3.7 RATIO
Triglycerides: 132 mg/dL (ref <150)
VLDL: 26 mg/dL (ref 0–40)

## 2021-09-28 LAB — URINALYSIS, ROUTINE W REFLEX MICROSCOPIC
Bilirubin Urine: NEGATIVE
Glucose, UA: NEGATIVE mg/dL
Hgb urine dipstick: NEGATIVE
Ketones, ur: NEGATIVE mg/dL
Leukocytes,Ua: NEGATIVE
Nitrite: NEGATIVE
Protein, ur: NEGATIVE mg/dL
Specific Gravity, Urine: 1.024 (ref 1.005–1.030)
pH: 5 (ref 5.0–8.0)

## 2021-09-28 LAB — SURGICAL PCR SCREEN
MRSA, PCR: NEGATIVE
Staphylococcus aureus: NEGATIVE

## 2021-09-28 LAB — HEPARIN LEVEL (UNFRACTIONATED): Heparin Unfractionated: 0.39 IU/mL (ref 0.30–0.70)

## 2021-09-28 SURGERY — LEFT HEART CATH AND CORONARY ANGIOGRAPHY
Anesthesia: LOCAL

## 2021-09-28 MED ORDER — HEPARIN (PORCINE) IN NACL 1000-0.9 UT/500ML-% IV SOLN
INTRAVENOUS | Status: AC
  Filled 2021-09-28: qty 1000

## 2021-09-28 MED ORDER — IOHEXOL 350 MG/ML SOLN
INTRAVENOUS | Status: DC | PRN
  Administered 2021-09-28: 70 mL

## 2021-09-28 MED ORDER — HEPARIN SODIUM (PORCINE) 1000 UNIT/ML IJ SOLN
INTRAMUSCULAR | Status: AC
  Filled 2021-09-28: qty 10

## 2021-09-28 MED ORDER — SODIUM CHLORIDE 0.9% FLUSH: 3.0000 mL | Freq: Two times a day (BID) | INTRAVENOUS | Status: DC

## 2021-09-28 MED ORDER — ONDANSETRON HCL 4 MG/2ML IJ SOLN
4.0000 mg | Freq: Four times a day (QID) | INTRAMUSCULAR | Status: DC | PRN
  Administered 2021-09-29: 4 mg via INTRAVENOUS
  Filled 2021-09-28: qty 2

## 2021-09-28 MED ORDER — HYDRALAZINE HCL 20 MG/ML IJ SOLN: 10.0000 mg | INTRAMUSCULAR | Status: DC | PRN

## 2021-09-28 MED ORDER — NITROGLYCERIN 1 MG/10 ML FOR IR/CATH LAB
INTRA_ARTERIAL | Status: AC
  Filled 2021-09-28: qty 10

## 2021-09-28 MED ORDER — SODIUM CHLORIDE 0.9 % IV SOLN: 250.0000 mL | INTRAVENOUS | Status: DC | PRN

## 2021-09-28 MED ORDER — ASPIRIN 81 MG PO CHEW
81.0000 mg | CHEWABLE_TABLET | Freq: Every day | ORAL | Status: DC
  Administered 2021-09-29: 81 mg via ORAL
  Filled 2021-09-28: qty 1

## 2021-09-28 MED ORDER — VERAPAMIL HCL 2.5 MG/ML IV SOLN
INTRAVENOUS | Status: AC
  Filled 2021-09-28: qty 2

## 2021-09-28 MED ORDER — MORPHINE SULFATE (PF) 2 MG/ML IV SOLN
2.0000 mg | INTRAVENOUS | Status: DC | PRN
  Administered 2021-09-28 (×2): 2 mg via INTRAVENOUS
  Filled 2021-09-28 (×2): qty 1

## 2021-09-28 MED ORDER — ATORVASTATIN CALCIUM 80 MG PO TABS: 80.0000 mg | ORAL_TABLET | Freq: Every day | ORAL | Status: DC

## 2021-09-28 MED ORDER — SODIUM CHLORIDE 0.9 % IV SOLN: INTRAVENOUS | Status: DC

## 2021-09-28 MED ORDER — VERAPAMIL HCL 2.5 MG/ML IV SOLN
INTRA_ARTERIAL | Status: DC | PRN
  Administered 2021-09-28: 10 mL via INTRA_ARTERIAL

## 2021-09-28 MED ORDER — LIDOCAINE HCL (PF) 1 % IJ SOLN
INTRAMUSCULAR | Status: AC
  Filled 2021-09-28: qty 30

## 2021-09-28 MED ORDER — LABETALOL HCL 5 MG/ML IV SOLN: 10.0000 mg | INTRAVENOUS | Status: DC | PRN

## 2021-09-28 MED ORDER — HEPARIN SODIUM (PORCINE) 1000 UNIT/ML IJ SOLN
INTRAMUSCULAR | Status: DC | PRN
  Administered 2021-09-28: 3500 [IU] via INTRAVENOUS

## 2021-09-28 MED ORDER — PERFLUTREN LIPID MICROSPHERE
1.0000 mL | INTRAVENOUS | Status: DC | PRN
  Administered 2021-09-28: 2 mL via INTRAVENOUS

## 2021-09-28 MED ORDER — MIDAZOLAM HCL 2 MG/2ML IJ SOLN
INTRAMUSCULAR | Status: AC
  Filled 2021-09-28: qty 2

## 2021-09-28 MED ORDER — LIDOCAINE HCL (PF) 1 % IJ SOLN
INTRAMUSCULAR | Status: DC | PRN
  Administered 2021-09-28: 2 mL via INTRADERMAL

## 2021-09-28 MED ORDER — HEPARIN (PORCINE) 25000 UT/250ML-% IV SOLN
1100.0000 [IU]/h | INTRAVENOUS | Status: DC
  Administered 2021-09-28: 900 [IU]/h via INTRAVENOUS
  Administered 2021-09-30: 1000 [IU]/h via INTRAVENOUS
  Filled 2021-09-28 (×2): qty 250

## 2021-09-28 MED ORDER — HEPARIN (PORCINE) IN NACL 1000-0.9 UT/500ML-% IV SOLN
INTRAVENOUS | Status: DC | PRN
  Administered 2021-09-28 (×2): 500 mL

## 2021-09-28 MED ORDER — FENTANYL CITRATE (PF) 100 MCG/2ML IJ SOLN
INTRAMUSCULAR | Status: AC
  Filled 2021-09-28: qty 2

## 2021-09-28 MED ORDER — SODIUM CHLORIDE 0.9% FLUSH: 3.0000 mL | INTRAVENOUS | Status: DC | PRN

## 2021-09-28 MED ORDER — MIDAZOLAM HCL 2 MG/2ML IJ SOLN
INTRAMUSCULAR | Status: DC | PRN
  Administered 2021-09-28: 1 mg via INTRAVENOUS

## 2021-09-28 MED ORDER — ACETAMINOPHEN 325 MG PO TABS
650.0000 mg | ORAL_TABLET | ORAL | Status: DC | PRN
  Administered 2021-09-29 (×3): 650 mg via ORAL
  Filled 2021-09-28 (×3): qty 2

## 2021-09-28 MED ORDER — FENTANYL CITRATE (PF) 100 MCG/2ML IJ SOLN
INTRAMUSCULAR | Status: DC | PRN
  Administered 2021-09-28: 25 ug via INTRAVENOUS

## 2021-09-28 SURGICAL SUPPLY — 12 items
BAND ZEPHYR COMPRESS 30 LONG (HEMOSTASIS) ×1 IMPLANT
CATH INFINITI JR4 5F (CATHETERS) ×1 IMPLANT
CATH OPTITORQUE TIG 4.0 5F (CATHETERS) ×1 IMPLANT
GLIDESHEATH SLEND A-KIT 6F 22G (SHEATH) ×1 IMPLANT
GUIDEWIRE INQWIRE 1.5J.035X260 (WIRE) IMPLANT
INQWIRE 1.5J .035X260CM (WIRE) ×2
KIT HEART LEFT (KITS) ×2 IMPLANT
PACK CARDIAC CATHETERIZATION (CUSTOM PROCEDURE TRAY) ×2 IMPLANT
SYR MEDRAD MARK 7 150ML (SYRINGE) ×2 IMPLANT
TRANSDUCER W/STOPCOCK (MISCELLANEOUS) ×2 IMPLANT
TUBING CIL FLEX 10 FLL-RA (TUBING) ×2 IMPLANT
WIRE HI TORQ VERSACORE-J 145CM (WIRE) ×1 IMPLANT

## 2021-09-28 NOTE — Progress Notes (Signed)
ANTICOAGULATION CONSULT NOTE   Pharmacy Consult for Heparin Indication: chest pain/ACS  No Known Allergies  Patient Measurements: Height: '5\' 10"'$  (177.8 cm) Weight: 63.6 kg (140 lb 3.2 oz) IBW/kg (Calculated) : 73 Heparin Dosing Weight: 68 kg  Vital Signs: Temp: 98.7 F (37.1 C) (07/31 0004) Temp Source: Oral (07/31 0004) BP: 124/70 (07/31 0948) Pulse Rate: 74 (07/31 0948)  Labs: Recent Labs    09/27/21 1001 09/27/21 1204 09/27/21 1802 09/27/21 1824 09/27/21 2020 09/28/21 0235  HGB 13.1  --   --   --   --   --   HCT 36.2*  --   --   --   --   --   PLT 104*  --   --   --   --   --   LABPROT 14.0  --   --   --   --   --   INR 1.1  --   --   --   --   --   HEPARINUNFRC  --   --   --  0.22*  --  0.39  CREATININE 0.82  --   --   --   --  0.82  TROPONINIHS 111* 243* 1,052*  --  1,592*  --      Estimated Creatinine Clearance: 75.4 mL/min (by C-G formula based on SCr of 0.82 mg/dL).   Medical History: Past Medical History:  Diagnosis Date   Hypertension     Medications:  Medications Prior to Admission  Medication Sig Dispense Refill Last Dose   lisinopril (ZESTRIL) 20 MG tablet Take 1 tablet by mouth daily.   09/26/2021   pantoprazole (PROTONIX) 40 MG tablet TAKE 1 TABLET BY MOUTH EVERY DAY (Patient taking differently: Take 40 mg by mouth daily.) 90 tablet 1 09/26/2021   triamcinolone cream (KENALOG) 0.1 % Apply 1 Application topically daily as needed (For rash).   Past Week   triamcinolone ointment (KENALOG) 0.1 % Apply 1 Application topically daily as needed (For rash).   Past Week    Scheduled:   aspirin  81 mg Oral Daily   atorvastatin  80 mg Oral Daily   atorvastatin  80 mg Oral Daily   metoprolol tartrate  25 mg Oral BID   pantoprazole  40 mg Oral Daily   sodium chloride flush  3 mL Intravenous Q12H   Infusions:   sodium chloride     sodium chloride     nitroGLYCERIN 5 mcg/min (09/28/21 0657)   PRN: sodium chloride, acetaminophen, acetaminophen,  ALPRAZolam, hydrALAZINE, labetalol, morphine injection, nitroGLYCERIN, ondansetron (ZOFRAN) IV, ondansetron (ZOFRAN) IV, sodium chloride flush, zolpidem  Assessment: 70 yom with presenting with chest pain. Heparin per pharmacy consult placed for chest pain/ACS. Patient was not on anticoagulation prior to arrival. 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 Surgery is being consulted. Hgb 13.1, plts 104 from 7/30.  Patient was therapeutic on 900 units/hr. Restart heparin 4 hours after TR band removal.   Goal of Therapy:  Heparin level 0.3-0.7 units/ml Monitor platelets by anticoagulation protocol: Yes   Plan:  No bolus  Restart heparin 900 units/hr  Check 6 hour heparin level  Daily CBC/ heparin level while on heparin   Eliseo Gum, PharmD PGY1 Pharmacy Resident   09/28/2021  10:51 AM

## 2021-09-28 NOTE — Plan of Care (Signed)

## 2021-09-28 NOTE — Progress Notes (Signed)
ANTICOAGULATION CONSULT NOTE   Pharmacy Consult for Heparin Indication: chest pain/ACS  No Known Allergies  Patient Measurements: Height: '5\' 10"'$  (177.8 cm) Weight: 63.6 kg (140 lb 3.2 oz) IBW/kg (Calculated) : 73 Heparin Dosing Weight: 68 kg  Vital Signs: BP: 141/81 (07/31 0004) Pulse Rate: 78 (07/31 0004)  Labs: Recent Labs    09/27/21 1001 09/27/21 1204 09/27/21 1802 09/27/21 1824 09/27/21 2020 09/28/21 0235  HGB 13.1  --   --   --   --   --   HCT 36.2*  --   --   --   --   --   PLT 104*  --   --   --   --   --   LABPROT 14.0  --   --   --   --   --   INR 1.1  --   --   --   --   --   HEPARINUNFRC  --   --   --  0.22*  --  0.39  CREATININE 0.82  --   --   --   --   --   TROPONINIHS 111* 243* 1,052*  --  1,592*  --      Estimated Creatinine Clearance: 75.4 mL/min (by C-G formula based on SCr of 0.82 mg/dL).   Medical History: Past Medical History:  Diagnosis Date   Hypertension     Medications:  Medications Prior to Admission  Medication Sig Dispense Refill Last Dose   lisinopril (ZESTRIL) 20 MG tablet Take 1 tablet by mouth daily.   09/26/2021   pantoprazole (PROTONIX) 40 MG tablet TAKE 1 TABLET BY MOUTH EVERY DAY (Patient taking differently: Take 40 mg by mouth daily.) 90 tablet 1 09/26/2021   triamcinolone cream (KENALOG) 0.1 % Apply 1 Application topically daily as needed (For rash).   Past Week   triamcinolone ointment (KENALOG) 0.1 % Apply 1 Application topically daily as needed (For rash).   Past Week    Scheduled:   aspirin  81 mg Oral Pre-Cath   atorvastatin  80 mg Oral Daily   metoprolol tartrate  25 mg Oral BID   pantoprazole  40 mg Oral Daily   sodium chloride flush  3 mL Intravenous Q12H   Infusions:   sodium chloride     sodium chloride     Followed by   sodium chloride     heparin 900 Units/hr (09/27/21 1948)   nitroGLYCERIN 5 mcg/min (09/27/21 2209)   PRN: sodium chloride, acetaminophen, ALPRAZolam, nitroGLYCERIN, ondansetron  (ZOFRAN) IV, sodium chloride flush, zolpidem  Assessment: 70 yom with presenting with chest pain. Heparin per pharmacy consult placed for chest pain/ACS. Patient was not on anticoagulation prior to arrival.  Heparin started at 800 units/hr following 4000 unit IV bolus. Heparin level is back at 0.22 which is sub-therapeutic.  No issues with infusion or bleeding per RN  Hgb 13.1; plt 104  7/31 AM update:  Heparin level therapeutic   Goal of Therapy:  Heparin level 0.3-0.7 units/ml Monitor platelets by anticoagulation protocol: Yes   Plan:  Cont heparin 900 units/hr 1200 heparin level  Narda Bonds, PharmD, BCPS Clinical Pharmacist Phone: [phone]

## 2021-09-28 NOTE — Interval H&P Note (Signed)
Cath Lab Visit (complete for each Cath Lab visit)  Clinical Evaluation Leading to the Procedure:   ACS: Yes.    Non-ACS:    Anginal Classification: CCS II  Anti-ischemic medical therapy: No Therapy  Non-Invasive Test Results: No non-invasive testing performed  Prior CABG: No previous CABG      History and Physical Interval Note:  09/28/2021 9:04 AM  Timothy [name]  has presented today for surgery, with the diagnosis of NSTEMI.  The various methods of treatment have been discussed with the patient and family. After consideration of risks, benefits and other options for treatment, the patient has consented to  Procedure(s): LEFT HEART CATH AND CORONARY ANGIOGRAPHY (N/A) as a surgical intervention.  The patient's history has been reviewed, patient examined, no change in status, stable for surgery.  I have reviewed the patient's chart and labs.  Questions were answered to the patient's satisfaction.     [name]

## 2021-09-28 NOTE — Progress Notes (Signed)
  Echocardiogram 2D Echocardiogram with contrast has been performed.  Merrie Roof F 09/28/2021, 1:52 PM

## 2021-09-28 NOTE — Progress Notes (Signed)
Progress Note  Patient Name: Timothy [name] Date of Encounter: 09/28/2021  [facility] Cardiologist: None   Subjective   No acute events overnight.  Cath today with MVD.   Inpatient Medications    Scheduled Meds:  aspirin  81 mg Oral Daily   atorvastatin  80 mg Oral Daily   metoprolol tartrate  25 mg Oral BID   pantoprazole  40 mg Oral Daily   sodium chloride flush  3 mL Intravenous Q12H   Continuous Infusions:  sodium chloride 75 mL/hr at 09/28/21 1202   sodium chloride     nitroGLYCERIN 5 mcg/min (09/28/21 0657)   PRN Meds: sodium chloride, acetaminophen, ALPRAZolam, hydrALAZINE, labetalol, morphine injection, nitroGLYCERIN, ondansetron (ZOFRAN) IV, sodium chloride flush, zolpidem   Vital Signs    Vitals:   09/28/21 0933 09/28/21 0938 09/28/21 0943 09/28/21 0948  BP: 133/78 118/69 116/64 124/70  Pulse: 81 74 79 74  Resp: '11 10 13 16  '$ Temp:      TempSrc:      SpO2: 100% 100% 100% 100%  Weight:      Height:        Intake/Output Summary (Last 24 hours) at 09/28/2021 1234 Last data filed at 09/28/2021 0657 Gross per 24 hour  Intake 887.26 ml  Output 450 ml  Net 437.26 ml      09/27/2021    2:12 PM 09/27/2021    9:58 AM  Last 3 Weights  Weight (lbs) 140 lb 3.2 oz 150 lb  Weight (kg) 63.594 kg 68.04 kg      Telemetry    SR - Personally Reviewed  ECG    NSR with anterolateral TW changes - Personally Reviewed  Physical Exam   GEN: No acute distress.   Neck: No JVD Cardiac: RRR, no murmurs, rubs, or gallops.  Respiratory: Clear to auscultation bilaterally. GI: Soft, nontender, non-distended  MS: No edema; No deformity. Neuro:  Nonfocal  Psych: Normal affect   Labs    High Sensitivity Troponin:   Recent Labs  Lab 09/27/21 1001 09/27/21 1204 09/27/21 1802 09/27/21 2020  TROPONINIHS 111* 243* 1,052* 1,592*     Chemistry Recent Labs  Lab 09/27/21 1001 09/28/21 0235  NA 131* 134*  K 4.0 3.8  CL 99 105  CO2 22 20*  GLUCOSE 138*  130*  BUN 9 7*  CREATININE 0.82 0.82  CALCIUM 9.6 9.1  PROT 7.8 6.4*  ALBUMIN 4.4 3.7  AST 128* 89*  ALT 84* 66*  ALKPHOS 83 68  BILITOT 1.1 2.4*  GFRNONAA >60 >60  ANIONGAP 10 9    Lipids  Recent Labs  Lab 09/28/21 0235  CHOL 146  TRIG 132  HDL 40*  LDLCALC 80  CHOLHDL 3.7    Hematology Recent Labs  Lab 09/27/21 1001  WBC 5.3  RBC 3.71*  HGB 13.1  HCT 36.2*  MCV 97.6  MCH 35.3*  MCHC 36.2*  RDW 11.9  PLT 104*   Thyroid No results for input(s): "TSH", "FREET4" in the last 168 hours.  BNPNo results for input(s): "BNP", "PROBNP" in the last 168 hours.  DDimer  Recent Labs  Lab 09/27/21 1001  DDIMER 0.31     Radiology    CARDIAC CATHETERIZATION  Result Date: 09/28/2021 Images from the original result were not included.   Prox RCA lesion is 99% stenosed.   Mid RCA to Dist RCA lesion is 99% stenosed.   Prox Cx to Mid Cx lesion is 70% stenosed.   Prox LAD to Mid  LAD lesion is 75% stenosed.   Prox LAD lesion is 75% stenosed.   Mid LAD lesion is 99% stenosed.   The left ventricular systolic function is normal.   LV end diastolic pressure is normal.   The left ventricular ejection fraction is 55-65% by visual estimate. Timothy [name] is a 71 y.o. male  [id number] LOCATION:  FACILITY: [place] PHYSICIAN: [name], M.D. 1951/01/21 DATE OF PROCEDURE:  09/28/2021 DATE OF DISCHARGE: CARDIAC CATHETERIZATION History obtained from chart review.Timothy [name] is a 71 y.o. male with hypertension, hyperlipidemia who is being seen 09/27/2021 for the evaluation of non-STEMI.   Mr. Carras has severely calcified vessels.  He has a total diffusely diseased RCA with left-to-right collaterals, long 75% calcified proximal LAD with a 95% calcified fairly focal mid LAD stenosis and 70% smooth calcified mid AV groove circumflex stenosis.  He has left-to-right collaterals and normal LV function.  I believe he is most suitable for complete revascularization with CABG.  The sheath was removed in his  upper hemostatic wristband was placed to achieve patent hemostasis.  The patient left lab in stable condition.  Heparin will be restarted in 4 hours without a bolus.  He will be hydrated, T CTS will be consulted. [name]. MD, [facility] 09/28/2021 10:06 AM    DG Chest Portable 1 View  Result Date: 09/27/2021 CLINICAL DATA:  Squeezing chest pain with nausea since 4 days EXAM: PORTABLE CHEST 1 VIEW COMPARISON:  None Available. FINDINGS: The heart size and mediastinal contours are within normal limits. Both lungs are clear. The visualized skeletal structures are unremarkable. IMPRESSION: No active disease. Electronically Signed   By: Frazier Richards M.D.   On: 09/27/2021 10:48    Cardiac Studies   Echo pending  Cath 7/31 Prox RCA lesion is 99% stenosed.   Mid RCA to Dist RCA lesion is 99% stenosed.   Prox Cx to Mid Cx lesion is 70% stenosed.   Prox LAD to Mid LAD lesion is 75% stenosed.   Prox LAD lesion is 75% stenosed.   Mid LAD lesion is 99% stenosed.   The left ventricular systolic function is normal.   LV end diastolic pressure is normal.   The left ventricular ejection fraction is 55-65% by visual estimate    Patient Profile     71 y.o. male with HTN, HL here with ACS and found to have MVD  Assessment & Plan     ACS:  Cont hep gtt, atorvastatin 80, ASA, nitro gtt for now.  He remains chest pain free.  Follow up echo. CAD:  CTS aware and will see patient in consultation. HL:  Cont high dose statin. HTN:  BP fairly well controlled.  For questions or updates, please contact [place] Please consult [email or web address] for contact info under        Signed, Early Osmond, MD  09/28/2021, 12:34 PM

## 2021-09-28 NOTE — Consult Note (Cosign Needed)
[address]       [place] [address]             [phone]        Donell C Bogdan [place]an Medical Record #[id number] Date of Birth: 16-May-1950  Referring: No ref. provider found Primary Care: Doreene Nest, MD Primary Cardiologist:None  Chief Complaint:    Chief Complaint  Patient presents with   Chest Pain    History of Present Illness:      We are asked to see this 71 year old male in cardiothoracic surgical consultation for consideration of coronary artery surgical revascularization.  The patient presented to the emergency department on 09/27/2021.  He has known cardiac risk factors including hypertension and hyperlipidemia.  Patient reports approximately 1 month history of chest discomfort but admits he can be even more.  He describes the pain as a pressure in the middle of his chest but does radiate to his left arm at times.  Symptoms sometimes present for over an hour and can occur a few times a day.  The symptoms have  worsened recently prompting emergency room visit to [place].  He initially was found to have no significant acute ischemic changes on EKG but initial troponins were elevated.  They subsequently rose significantly from 111 to 1592 ruling in for non-STEMI.  Additionally, he developed a more severe event of chest pain and EKG demonstrated some ST depressions.  Cardiology consultation was obtained.  He was felt to require transfer to [facility] for further management and diagnostic evaluation.  He was started on Nitropaste and subsequently a nitroglycerin drip.  He was also started on heparin.  He is noted to have a previous nuclear stress in 2021.  EF at that time was 69% the study was noted to be normal with low risk.  On today's date he was taken to the cardiac catheterization lab where he was found to have severe three-vessel coronary artery disease.  Left ventricular ejection fraction was 55 to 65% by visual estimate.  LVEDP  was normal.  Vessels are felt to be severely calcified.  An echocardiogram has been ordered and results are pending.  He is noted to have some mild elevation in liver enzymes and total bilirubin.  Renal function is noted to be normal prior to catheterization.  LDL cholesterol is 80.  He has a mild anemia and thrombocytopenia. He does not have a known history of diabetes. Current Activity/ Functional Status: Patient is independent with mobility/ambulation, transfers, ADL's, IADL's.   Zubrod Score: At the time of surgery this patient's most appropriate activity status/level should be described as: '[]'$     0    Normal activity, no symptoms '[x]'$     1    Restricted in physical strenuous activity but ambulatory, able to do out light work '[]'$     2    Ambulatory and capable of self care, unable to do work activities, up and about                 more than 50%  Of the time                            '[]'$     3    Only limited self care, in bed greater than 50% of waking hours '[]'$     4    Completely disabled, no self care, confined to bed or chair '[]'$   5    Moribund  Past Medical History:  Diagnosis Date   Hypertension     Past Surgical History:  Procedure Laterality Date   JOINT REPLACEMENT     LEFT HEART CATH AND CORONARY ANGIOGRAPHY N/A 09/28/2021   Procedure: LEFT HEART CATH AND CORONARY ANGIOGRAPHY;  Surgeon: Lorretta Harp, MD;  Location: [place] [facility];  Service: Cardiovascular;  Laterality: N/A;    Social History   Tobacco Use  Smoking Status Never  Smokeless Tobacco Not on file    Social History   Substance and Sexual Activity  Alcohol Use Yes     No Known Allergies  Current Facility-Administered Medications  Medication Dose Route Frequency Provider Last Rate Last Admin   acetaminophen (TYLENOL) tablet 650 mg  650 mg Oral Q4H PRN Barrett, Evelene Croon, PA-C       ALPRAZolam Duanne Moron) tablet 0.25 mg  0.25 mg Oral BID PRN Barrett, Evelene Croon, PA-C       atorvastatin (LIPITOR) tablet  80 mg  80 mg Oral Daily Barrett, Rhonda G, PA-C   80 mg at 09/28/21 0851   heparin ADULT infusion 100 units/mL (25000 units/249m)  900 Units/hr Intravenous Continuous Camnitz, Will MHassell Done MD 9 mL/hr at 09/28/21 0658 900 Units/hr at 09/28/21 0658   metoprolol tartrate (LOPRESSOR) tablet 25 mg  25 mg Oral BID Barrett, Rhonda G, PA-C   25 mg at 09/28/21 0852   nitroGLYCERIN (NITROSTAT) SL tablet 0.4 mg  0.4 mg Sublingual Q5 Min x 3 PRN Barrett, REvelene Croon PA-C       nitroGLYCERIN 50 mg in dextrose 5 % 250 mL (0.2 mg/mL) infusion  0-200 mcg/min Intravenous Continuous DLucrezia Starch MD 1.5 mL/hr at 09/28/21 0657 5 mcg/min at 09/28/21 0657   ondansetron (ZOFRAN) injection 4 mg  4 mg Intravenous Q6H PRN Barrett, Rhonda G, PA-C       pantoprazole (PROTONIX) EC tablet 40 mg  40 mg Oral Daily Barrett, Rhonda G, PA-C   40 mg at 09/28/21 05643  zolpidem (AMBIEN) tablet 5 mg  5 mg Oral QHS PRN Barrett, REvelene Croon PA-C        Medications Prior to Admission  Medication Sig Dispense Refill Last Dose   lisinopril (ZESTRIL) 20 MG tablet Take 1 tablet by mouth daily.   09/26/2021   pantoprazole (PROTONIX) 40 MG tablet TAKE 1 TABLET BY MOUTH EVERY DAY (Patient taking differently: Take 40 mg by mouth daily.) 90 tablet 1 09/26/2021   triamcinolone cream (KENALOG) 0.1 % Apply 1 Application topically daily as needed (For rash).   Past Week   triamcinolone ointment (KENALOG) 0.1 % Apply 1 Application topically daily as needed (For rash).   Past Week    History reviewed. No pertinent family history.   Review of Systems:   Review of Systems  Constitutional:  Positive for malaise/fatigue and weight loss. Negative for chills, diaphoresis and fever.  HENT:  Positive for congestion, hearing loss and tinnitus. Negative for ear discharge, ear pain, nosebleeds, sinus pain and sore throat.   Eyes:  Negative for blurred vision, double vision, photophobia, pain, discharge and redness.       Left eye macular "pucker", has  had treatment of right eye  Respiratory:  Positive for shortness of breath. Negative for cough, hemoptysis, sputum production, wheezing and stridor.   Cardiovascular:  Positive for chest pain. Negative for palpitations, orthopnea, claudication, leg swelling and PND.  Gastrointestinal:  Positive for blood in stool and heartburn. Negative for abdominal pain, constipation,  diarrhea, melena, nausea and vomiting.       + hemmerhoids  Genitourinary: Negative.        + BPH, no sx  Musculoskeletal:  Positive for joint pain and myalgias. Negative for back pain, falls and neck pain.       Previous: left shoulder, right knee replacement, right wrist - surgery  Skin: Negative.   Neurological:  Negative for dizziness, tingling, tremors, sensory change, speech change, focal weakness, seizures, loss of consciousness, weakness and headaches.  Endo/Heme/Allergies:  Positive for environmental allergies. Negative for polydipsia. Bruises/bleeds easily.  Psychiatric/Behavioral:  Negative for depression, hallucinations, memory loss, substance abuse and suicidal ideas. The patient is not nervous/anxious and does not have insomnia.          Physical Exam: BP 124/70   Pulse 74   Temp 98.7 F (37.1 C) (Oral)   Resp 16   Ht '5\' 10"'$  (1.778 m)   Wt 63.6 kg   SpO2 100%   BMI 20.12 kg/m    General appearance: alert, cooperative, and no distress Head: Normocephalic, without obvious abnormality, atraumatic Neck: no adenopathy, no JVD, supple, symmetrical, trachea midline, thyroid not enlarged, symmetric, no tenderness/mass/nodules, and soft left carotid bruit Lymph nodes: Cervical, supraclavicular, and axillary nodes normal. Resp: clear to auscultation bilaterally Back: symmetric, no curvature. ROM normal. No CVA tenderness. Cardio: regular rate and rhythm, S1, S2 normal, no murmur, click, rub or gallop GI: soft, non-tender; bowel sounds normal; no masses,  no organomegaly Extremities: extremities normal,  atraumatic, no cyanosis or edema Neurologic: Grossly normal Palpable pulses  Diagnostic Studies & Laboratory data:     Recent Radiology Findings:   CARDIAC CATHETERIZATION  Result Date: 09/28/2021 Images from the original result were not included.   Prox RCA lesion is 99% stenosed.   Mid RCA to Dist RCA lesion is 99% stenosed.   Prox Cx to Mid Cx lesion is 70% stenosed.   Prox LAD to Mid LAD lesion is 75% stenosed.   Prox LAD lesion is 75% stenosed.   Mid LAD lesion is 99% stenosed.   The left ventricular systolic function is normal.   LV end diastolic pressure is normal.   The left ventricular ejection fraction is 55-65% by visual estimate. JERAMY DIMMICK is a 71 y.o. male  [id number] LOCATION:  FACILITY: [place] PHYSICIAN: [name], M.D. Aug 22, 1950 DATE OF PROCEDURE:  09/28/2021 DATE OF DISCHARGE: CARDIAC CATHETERIZATION History obtained from chart review.AMITAI DELAUGHTER is a 71 y.o. male with hypertension, hyperlipidemia who is being seen 09/27/2021 for the evaluation of non-STEMI.   Mr. Trudell has severely calcified vessels.  He has a total diffusely diseased RCA with left-to-right collaterals, long 75% calcified proximal LAD with a 95% calcified fairly focal mid LAD stenosis and 70% smooth calcified mid AV groove circumflex stenosis.  He has left-to-right collaterals and normal LV function.  I believe he is most suitable for complete revascularization with CABG.  The sheath was removed in his upper hemostatic wristband was placed to achieve patent hemostasis.  The patient left lab in stable condition.  Heparin will be restarted in 4 hours without a bolus.  He will be hydrated, T CTS will be consulted. [name]. MD, [facility] 09/28/2021 10:06 AM    DG Chest Portable 1 View  Result Date: 09/27/2021 CLINICAL DATA:  Squeezing chest pain with nausea since 4 days EXAM: PORTABLE CHEST 1 VIEW COMPARISON:  None Available. FINDINGS: The heart size and mediastinal contours are within normal limits. Both  lungs are clear.  The visualized skeletal structures are unremarkable. IMPRESSION: No active disease. Electronically Signed   By: Frazier Richards M.D.   On: 09/27/2021 10:48     I have independently reviewed the above radiologic studies and discussed with the patient   Recent Lab Findings: Lab Results  Component Value Date   WBC 5.3 09/27/2021   HGB 13.1 09/27/2021   HCT 36.2 (L) 09/27/2021   PLT 104 (L) 09/27/2021   GLUCOSE 130 (H) 09/28/2021   CHOL 146 09/28/2021   TRIG 132 09/28/2021   HDL 40 (L) 09/28/2021   LDLCALC 80 09/28/2021   ALT 66 (H) 09/28/2021   AST 89 (H) 09/28/2021   NA 134 (L) 09/28/2021   K 3.8 09/28/2021   CL 105 09/28/2021   CREATININE 0.82 09/28/2021   BUN 7 (L) 09/28/2021   CO2 20 (L) 09/28/2021   INR 1.1 09/27/2021      Assessment / Plan: Severe three-vessel coronary artery disease in the setting of non-STEMI. Hypertension Previous left shoulder surgery Previous right knee replacement Previous right wrist surgery  The surgeon will evaluate the patient and relevant studies.  He appears to be a good candidate for coronary artery bypass grafting.  Timing to be determined.   I  spent 40 minutes counseling the patient face to face.   Jaquavion Giovanni, PA-C  Pager [phone] 09/28/2021 10:34 AM Agree with assessment above by Evonnie Pat PA  I have examined the patient and reviewed the images of the coronary angiogram and 2D echocardiogram.  Although the patient has heavily calcified coronary vessels, he meets criteria for CABG which would be the best long-term therapy for his severe three-vessel CAD with recent non-STEMI.  He understands the risks and benefits of surgery.  Surgery will be scheduled for Wednesday a.m. August 2.

## 2021-09-29 ENCOUNTER — Inpatient Hospital Stay (HOSPITAL_COMMUNITY): Payer: Medicare Other

## 2021-09-29 ENCOUNTER — Encounter (HOSPITAL_COMMUNITY): Payer: Medicare Other

## 2021-09-29 ENCOUNTER — Encounter (HOSPITAL_COMMUNITY): Payer: Self-pay | Admitting: Cardiology

## 2021-09-29 DIAGNOSIS — I214 Non-ST elevation (NSTEMI) myocardial infarction: Secondary | ICD-10-CM | POA: Diagnosis not present

## 2021-09-29 DIAGNOSIS — Z01810 Encounter for preprocedural cardiovascular examination: Secondary | ICD-10-CM

## 2021-09-29 LAB — PULMONARY FUNCTION TEST
FEF 25-75 Pre: 3.13 L/sec
FEF2575-%Pred-Pre: 126 %
FEV1-%Pred-Pre: 93 %
FEV1-Pre: 3.06 L
FEV1FVC-%Pred-Pre: 110 %
FEV6-%Pred-Pre: 89 %
FEV6-Pre: 3.74 L
FEV6FVC-%Pred-Pre: 105 %
FVC-%Pred-Pre: 84 %
FVC-Pre: 3.75 L
Pre FEV1/FVC ratio: 82 %
Pre FEV6/FVC Ratio: 100 %

## 2021-09-29 LAB — CBC
HCT: 30.1 % — ABNORMAL LOW (ref 39.0–52.0)
Hemoglobin: 10.9 g/dL — ABNORMAL LOW (ref 13.0–17.0)
MCH: 35.7 pg — ABNORMAL HIGH (ref 26.0–34.0)
MCHC: 36.2 g/dL — ABNORMAL HIGH (ref 30.0–36.0)
MCV: 98.7 fL (ref 80.0–100.0)
Platelets: 82 10*3/uL — ABNORMAL LOW (ref 150–400)
RBC: 3.05 MIL/uL — ABNORMAL LOW (ref 4.22–5.81)
RDW: 11.7 % (ref 11.5–15.5)
WBC: 5.4 10*3/uL (ref 4.0–10.5)
nRBC: 0 % (ref 0.0–0.2)

## 2021-09-29 LAB — COMPREHENSIVE METABOLIC PANEL
ALT: 57 U/L — ABNORMAL HIGH (ref 0–44)
AST: 71 U/L — ABNORMAL HIGH (ref 15–41)
Albumin: 3.6 g/dL (ref 3.5–5.0)
Alkaline Phosphatase: 62 U/L (ref 38–126)
Anion gap: 8 (ref 5–15)
BUN: 8 mg/dL (ref 8–23)
CO2: 20 mmol/L — ABNORMAL LOW (ref 22–32)
Calcium: 9.1 mg/dL (ref 8.9–10.3)
Chloride: 107 mmol/L (ref 98–111)
Creatinine, Ser: 0.87 mg/dL (ref 0.61–1.24)
GFR, Estimated: >60 mL/min (ref >60)
Glucose, Bld: 118 mg/dL — ABNORMAL HIGH (ref 70–99)
Potassium: 3.4 mmol/L — ABNORMAL LOW (ref 3.5–5.1)
Sodium: 135 mmol/L (ref 135–145)
Total Bilirubin: 2.3 mg/dL — ABNORMAL HIGH (ref 0.3–1.2)
Total Protein: 6.2 g/dL — ABNORMAL LOW (ref 6.5–8.1)

## 2021-09-29 LAB — BLOOD GAS, ARTERIAL
Acid-base deficit: 2.3 mmol/L — ABNORMAL HIGH (ref 0.0–2.0)
Bicarbonate: 21.4 mmol/L (ref 20.0–28.0)
O2 Saturation: 99.2 %
Patient temperature: 37
pCO2 arterial: 33 mmHg (ref 32–48)
pH, Arterial: 7.42 (ref 7.35–7.45)
pO2, Arterial: 117 mmHg — ABNORMAL HIGH (ref 83–108)

## 2021-09-29 LAB — SARS CORONAVIRUS 2 BY RT PCR: SARS Coronavirus 2 by RT PCR: NEGATIVE

## 2021-09-29 LAB — LIPOPROTEIN A (LPA): Lipoprotein (a): <8.4 nmol/L (ref <75.0)

## 2021-09-29 LAB — HEMOGLOBIN A1C
Hgb A1c MFr Bld: 4.7 % — ABNORMAL LOW (ref 4.8–5.6)
Mean Plasma Glucose: 88.19 mg/dL

## 2021-09-29 LAB — PREPARE RBC (CROSSMATCH)

## 2021-09-29 LAB — TSH: TSH: 1.44 u[IU]/mL (ref 0.350–4.500)

## 2021-09-29 LAB — HEPARIN LEVEL (UNFRACTIONATED)
Heparin Unfractionated: 0.23 IU/mL — ABNORMAL LOW (ref 0.30–0.70)
Heparin Unfractionated: 0.4 IU/mL (ref 0.30–0.70)

## 2021-09-29 LAB — ABO/RH: ABO/RH(D): O POS

## 2021-09-29 MED ORDER — TRANEXAMIC ACID (OHS) BOLUS VIA INFUSION
15.0000 mg/kg | INTRAVENOUS | Status: AC
  Administered 2021-09-30: 954 mg via INTRAVENOUS
  Filled 2021-09-29: qty 954

## 2021-09-29 MED ORDER — NITROGLYCERIN IN D5W 200-5 MCG/ML-% IV SOLN
2.0000 ug/min | INTRAVENOUS | Status: AC
  Administered 2021-09-30: 15 ug/min via INTRAVENOUS
  Filled 2021-09-29: qty 250

## 2021-09-29 MED ORDER — BISACODYL 5 MG PO TBEC
5.0000 mg | DELAYED_RELEASE_TABLET | Freq: Once | ORAL | Status: AC
  Administered 2021-09-29: 5 mg via ORAL
  Filled 2021-09-29: qty 1

## 2021-09-29 MED ORDER — METOPROLOL TARTRATE 12.5 MG HALF TABLET: 12.5000 mg | ORAL_TABLET | Freq: Once | ORAL | Status: DC

## 2021-09-29 MED ORDER — CEFAZOLIN SODIUM-DEXTROSE 2-4 GM/100ML-% IV SOLN
2.0000 g | INTRAVENOUS | Status: AC
  Administered 2021-09-30: 2 g via INTRAVENOUS
  Filled 2021-09-29: qty 100

## 2021-09-29 MED ORDER — MAGNESIUM SULFATE 50 % IJ SOLN
40.0000 meq | INTRAMUSCULAR | Status: DC
  Filled 2021-09-29: qty 9.85

## 2021-09-29 MED ORDER — HEPARIN 30,000 UNITS/1000 ML (OHS) CELLSAVER SOLUTION
Status: DC
  Filled 2021-09-29: qty 1000

## 2021-09-29 MED ORDER — CHLORHEXIDINE GLUCONATE 0.12 % MT SOLN: 15.0000 mL | Freq: Once | OROMUCOSAL | Status: DC

## 2021-09-29 MED ORDER — TRANEXAMIC ACID 1000 MG/10ML IV SOLN
1.5000 mg/kg/h | INTRAVENOUS | Status: AC
  Administered 2021-09-30: 1.5 mg/kg/h via INTRAVENOUS
  Filled 2021-09-29: qty 25

## 2021-09-29 MED ORDER — AMLODIPINE BESYLATE 5 MG PO TABS
5.0000 mg | ORAL_TABLET | Freq: Every day | ORAL | Status: DC
Start: 2021-09-29 — End: 2021-09-30
  Administered 2021-09-29: 5 mg via ORAL
  Filled 2021-09-29: qty 1

## 2021-09-29 MED ORDER — CHLORHEXIDINE GLUCONATE CLOTH 2 % EX PADS
6.0000 | MEDICATED_PAD | Freq: Once | CUTANEOUS | Status: AC
  Administered 2021-09-30: 6 via TOPICAL

## 2021-09-29 MED ORDER — POTASSIUM CHLORIDE CRYS ER 20 MEQ PO TBCR
40.0000 meq | EXTENDED_RELEASE_TABLET | Freq: Once | ORAL | Status: AC
Start: 2021-09-29 — End: 2021-09-29
  Administered 2021-09-29: 40 meq via ORAL
  Filled 2021-09-29: qty 2

## 2021-09-29 MED ORDER — POTASSIUM CHLORIDE 2 MEQ/ML IV SOLN
80.0000 meq | INTRAVENOUS | Status: DC
  Filled 2021-09-29: qty 40

## 2021-09-29 MED ORDER — NOREPINEPHRINE 4 MG/250ML-% IV SOLN
0.0000 ug/min | INTRAVENOUS | Status: DC
  Filled 2021-09-29: qty 250

## 2021-09-29 MED ORDER — DIAZEPAM 5 MG PO TABS: 5.0000 mg | ORAL_TABLET | Freq: Once | ORAL | Status: DC

## 2021-09-29 MED ORDER — VANCOMYCIN HCL 1250 MG/250ML IV SOLN
1250.0000 mg | INTRAVENOUS | Status: AC
  Administered 2021-09-30: 1250 mg via INTRAVENOUS
  Filled 2021-09-29: qty 250

## 2021-09-29 MED ORDER — PLASMA-LYTE A IV SOLN
INTRAVENOUS | Status: DC
  Filled 2021-09-29: qty 2.5

## 2021-09-29 MED ORDER — TEMAZEPAM 15 MG PO CAPS: 15.0000 mg | ORAL_CAPSULE | Freq: Once | ORAL | Status: DC | PRN

## 2021-09-29 MED ORDER — INSULIN REGULAR(HUMAN) IN NACL 100-0.9 UT/100ML-% IV SOLN
INTRAVENOUS | Status: AC
  Administered 2021-09-30: .8 [IU]/h via INTRAVENOUS
  Filled 2021-09-29: qty 100

## 2021-09-29 MED ORDER — DEXMEDETOMIDINE HCL IN NACL 400 MCG/100ML IV SOLN
0.1000 ug/kg/h | INTRAVENOUS | Status: AC
  Administered 2021-09-30: .3 ug/kg/h via INTRAVENOUS
  Filled 2021-09-29: qty 100

## 2021-09-29 MED ORDER — TRANEXAMIC ACID (OHS) PUMP PRIME SOLUTION
2.0000 mg/kg | INTRAVENOUS | Status: DC
  Filled 2021-09-29: qty 1.27

## 2021-09-29 MED ORDER — EPINEPHRINE HCL 5 MG/250ML IV SOLN IN NS
0.0000 ug/min | INTRAVENOUS | Status: DC
  Filled 2021-09-29: qty 250

## 2021-09-29 MED ORDER — PHENYLEPHRINE HCL-NACL 20-0.9 MG/250ML-% IV SOLN
30.0000 ug/min | INTRAVENOUS | Status: AC
  Administered 2021-09-30: 25 ug/min via INTRAVENOUS
  Filled 2021-09-29: qty 250

## 2021-09-29 MED ORDER — MILRINONE LACTATE IN DEXTROSE 20-5 MG/100ML-% IV SOLN
0.3000 ug/kg/min | INTRAVENOUS | Status: DC
  Filled 2021-09-29: qty 100

## 2021-09-29 NOTE — Anesthesia Preprocedure Evaluation (Addendum)
Anesthesia Evaluation  Patient identified by MRN, date of birth, ID band Patient awake    Reviewed: Allergy & Precautions, NPO status , Patient's Chart, lab work & pertinent test results  Airway Mallampati: III  TM Distance: <3 FB Neck ROM: Full    Dental  (+) Teeth Intact, Dental Advisory Given   Pulmonary neg pulmonary ROS,    Pulmonary exam normal breath sounds clear to auscultation       Cardiovascular hypertension, Pt. on medications + angina + CAD and + Past MI  Normal cardiovascular exam Rhythm:Regular Rate:Normal  Echo 09/28/21: 1. Limited study due to poor echo windows.  2. Left ventricular ejection fraction, by estimation, is 60 to 65%. The  left ventricle has normal function. The left ventricle has no regional  wall motion abnormalities. Left ventricular diastolic parameters are  consistent with Grade I diastolic  dysfunction (impaired relaxation).  3. Right ventricular systolic function is normal. The right ventricular  size is normal. Tricuspid regurgitation signal is inadequate for assessing  PA pressure.  4. Left atrial size was mildly dilated.  5. The mitral valve is grossly normal. Trivial mitral valve  regurgitation.  6. The aortic valve was not well visualized. Aortic valve regurgitation  is not visualized. No aortic stenosis is present.  7. The inferior vena cava is normal in size with greater than 50%  respiratory variability, suggesting right atrial pressure of 3 mmHg.    Neuro/Psych negative neurological ROS  negative psych ROS   GI/Hepatic Neg liver ROS, GERD  Medicated,  Endo/Other  negative endocrine ROS  Renal/GU negative Renal ROS     Musculoskeletal negative musculoskeletal ROS (+)   Abdominal   Peds  Hematology  (+) Blood dyscrasia (Thrombocytopenia), anemia ,   Anesthesia Other Findings   Reproductive/Obstetrics                             Anesthesia Physical Anesthesia Plan  ASA: 4  Anesthesia Plan: General   Post-op Pain Management:    Induction: Intravenous  PONV Risk Score and Plan: 2 and Midazolam and Treatment may vary due to age or medical condition  Airway Management Planned: Oral ETT  Additional Equipment: Arterial line, PA Cath, CVP, TEE and Ultrasound Guidance Line Placement  Intra-op Plan:   Post-operative Plan: Post-operative intubation/ventilation  Informed Consent: I have reviewed the patients History and Physical, chart, labs and discussed the procedure including the risks, benefits and alternatives for the proposed anesthesia with the patient or authorized representative who has indicated his/her understanding and acceptance.     Dental advisory given  Plan Discussed with: CRNA  Anesthesia Plan Comments:        Anesthesia Quick Evaluation

## 2021-09-29 NOTE — Progress Notes (Addendum)
Progress Note  Patient Name: Timothy [name] Date of Encounter: 09/29/2021  [facility] Cardiologist: None   Subjective   Had a rough night, recurrent chest pain and elevated blood pressures. Resolved with up-titration of IV nitro along with SL. No chest pain this morning, only headache.   Inpatient Medications    Scheduled Meds:  aspirin  81 mg Oral Daily   atorvastatin  80 mg Oral Daily   metoprolol tartrate  25 mg Oral BID   pantoprazole  40 mg Oral Daily   sodium chloride flush  3 mL Intravenous Q12H   Continuous Infusions:  sodium chloride     heparin 1,000 Units/hr (09/29/21 0535)   nitroGLYCERIN 25 mcg/min (09/28/21 2324)   PRN Meds: sodium chloride, acetaminophen, ALPRAZolam, morphine injection, nitroGLYCERIN, ondansetron (ZOFRAN) IV, sodium chloride flush, zolpidem   Vital Signs    Vitals:   09/29/21 0435 09/29/21 0530 09/29/21 0745 09/29/21 0800  BP: (!) 143/78 (!) 149/77 (!) 148/61 (!) 142/74  Pulse: 73 65 (!) 56 60  Resp:    18  Temp:      TempSrc: Oral     SpO2: 100% 100% 100% 100%  Weight:      Height:        Intake/Output Summary (Last 24 hours) at 09/29/2021 0838 Last data filed at 09/29/2021 0600 Gross per 24 hour  Intake 1008.6 ml  Output 400 ml  Net 608.6 ml      09/27/2021    2:12 PM 09/27/2021    9:58 AM  Last 3 Weights  Weight (lbs) 140 lb 3.2 oz 150 lb  Weight (kg) 63.594 kg 68.04 kg      Telemetry    Sinus Bradycardia - Personally Reviewed  ECG    Sinus Bradycardia with TWI in lateral leads- Personally Reviewed  Physical Exam   GEN: No acute distress.   Neck: No JVD Cardiac: RRR, no murmurs, rubs, or gallops.  Respiratory: Clear to auscultation bilaterally. GI: Soft, nontender, non-distended  MS: No edema; No deformity. Right radial cath site stable.  Neuro:  Nonfocal  Psych: Normal affect   Labs    High Sensitivity Troponin:   Recent Labs  Lab 09/27/21 1001 09/27/21 1204 09/27/21 1802 09/27/21 2020   TROPONINIHS 111* 243* 1,052* 1,592*     Chemistry Recent Labs  Lab 09/27/21 1001 09/28/21 0235 09/29/21 0424  NA 131* 134* 135  K 4.0 3.8 3.4*  CL 99 105 107  CO2 22 20* 20*  GLUCOSE 138* 130* 118*  BUN 9 7* 8  CREATININE 0.82 0.82 0.87  CALCIUM 9.6 9.1 9.1  PROT 7.8 6.4* 6.2*  ALBUMIN 4.4 3.7 3.6  AST 128* 89* 71*  ALT 84* 66* 57*  ALKPHOS 83 68 62  BILITOT 1.1 2.4* 2.3*  GFRNONAA >60 >60 >60  ANIONGAP '10 9 8    '$ Lipids  Recent Labs  Lab 09/28/21 0235  CHOL 146  TRIG 132  HDL 40*  LDLCALC 80  CHOLHDL 3.7    Hematology Recent Labs  Lab 09/27/21 1001 09/29/21 0424  WBC 5.3 5.4  RBC 3.71* 3.05*  HGB 13.1 10.9*  HCT 36.2* 30.1*  MCV 97.6 98.7  MCH 35.3* 35.7*  MCHC 36.2* 36.2*  RDW 11.9 11.7  PLT 104* 82*   Thyroid  Recent Labs  Lab 09/29/21 0424  TSH 1.440    BNPNo results for input(s): "BNP", "PROBNP" in the last 168 hours.  DDimer  Recent Labs  Lab 09/27/21 1001  DDIMER 0.31  Radiology    ECHOCARDIOGRAM COMPLETE  Result Date: 09/28/2021    ECHOCARDIOGRAM REPORT   Patient Name:   Timothy [name] Date of Exam: 09/28/2021 Medical Rec #:  [id number]      Height:       70.0 in Accession #:    [id number]     Weight:       140.2 lb Date of Birth:  21-Feb-1951      BSA:          1.795 m Patient Age:    71 years       BP:           148/69 mmHg Patient Gender: M              HR:           73 bpm. Exam Location:  Inpatient Procedure: 2D Echo, Cardiac Doppler, Color Doppler and Intracardiac            Opacification Agent Indications:    Pre-CABG  History:        Patient has no prior history of Echocardiogram examinations.                 CAD; Cardiac cath 09/28/21.  Sonographer:    Merrie Roof RDCS Referring Phys: [place]  1. Limited study due to poor echo windows.  2. Left ventricular ejection fraction, by estimation, is 60 to 65%. The left ventricle has normal function. The left ventricle has no regional wall motion abnormalities.  Left ventricular diastolic parameters are consistent with Grade I diastolic dysfunction (impaired relaxation).  3. Right ventricular systolic function is normal. The right ventricular size is normal. Tricuspid regurgitation signal is inadequate for assessing PA pressure.  4. Left atrial size was mildly dilated.  5. The mitral valve is grossly normal. Trivial mitral valve regurgitation.  6. The aortic valve was not well visualized. Aortic valve regurgitation is not visualized. No aortic stenosis is present.  7. The inferior vena cava is normal in size with greater than 50% respiratory variability, suggesting right atrial pressure of 3 mmHg. Comparison(s): No prior Echocardiogram. FINDINGS  Left Ventricle: Left ventricular ejection fraction, by estimation, is 60 to 65%. The left ventricle has normal function. The left ventricle has no regional wall motion abnormalities. Definity contrast agent was given IV to delineate the left ventricular  endocardial borders. The left ventricular internal cavity size was normal in size. There is no left ventricular hypertrophy. Left ventricular diastolic parameters are consistent with Grade I diastolic dysfunction (impaired relaxation). Right Ventricle: The right ventricular size is normal. Right vetricular wall thickness was not well visualized. Right ventricular systolic function is normal. Tricuspid regurgitation signal is inadequate for assessing PA pressure. Left Atrium: Left atrial size was mildly dilated. Right Atrium: Right atrial size was normal in size. Pericardium: There is no evidence of pericardial effusion. Mitral Valve: The mitral valve is grossly normal. Trivial mitral valve regurgitation. Tricuspid Valve: The tricuspid valve is normal in structure. Tricuspid valve regurgitation is not demonstrated. Aortic Valve: The aortic valve was not well visualized. Aortic valve regurgitation is not visualized. No aortic stenosis is present. Aortic valve mean gradient measures  6.0 mmHg. Aortic valve peak gradient measures 10.4 mmHg. Aortic valve area, by VTI measures 2.10 cm. Pulmonic Valve: The pulmonic valve was not well visualized. Aorta: The aortic root is normal in size and structure. Venous: The inferior vena cava is normal in size with greater than 50% respiratory variability, suggesting right  atrial pressure of 3 mmHg. IAS/Shunts: The atrial septum is grossly normal.  LEFT VENTRICLE PLAX 2D LVIDd:         4.30 cm   Diastology LVIDs:         2.80 cm   LV e' medial:    8.39 cm/s LV PW:         0.80 cm   LV E/e' medial:  8.0 LV IVS:        0.90 cm   LV e' lateral:   11.80 cm/s LVOT diam:     2.00 cm   LV E/e' lateral: 5.7 LV SV:         64 LV SV Index:   36 LVOT Area:     3.14 cm  RIGHT VENTRICLE RV S prime:     15.90 cm/s TAPSE (M-mode): 2.5 cm LEFT ATRIUM           Index        RIGHT ATRIUM           Index LA diam:      2.80 cm 1.56 cm/m   RA Area:     18.10 cm LA Vol (A2C): 34.3 ml 19.11 ml/m  RA Volume:   54.60 ml  30.42 ml/m LA Vol (A4C): 72.1 ml 40.17 ml/m  AORTIC VALVE AV Area (Vmax):    2.05 cm AV Area (Vmean):   1.95 cm AV Area (VTI):     2.10 cm AV Vmax:           161.00 cm/s AV Vmean:          107.000 cm/s AV VTI:            0.303 m AV Peak Grad:      10.4 mmHg AV Mean Grad:      6.0 mmHg LVOT Vmax:         105.00 cm/s LVOT Vmean:        66.400 cm/s LVOT VTI:          0.203 m LVOT/AV VTI ratio: 0.67  AORTA Ao Root diam: 3.10 cm Ao Asc diam:  3.00 cm MITRAL VALVE MV Area (PHT): 4.06 cm    SHUNTS MV Decel Time: 187 msec    Systemic VTI:  0.20 m MV E velocity: 66.80 cm/s  Systemic Diam: 2.00 cm MV A velocity: 69.90 cm/s MV E/A ratio:  0.96 Gwyndolyn Kaufman MD Electronically signed by Gwyndolyn Kaufman MD Signature Date/Time: 09/28/2021/3:59:02 PM    Final    CARDIAC CATHETERIZATION  Result Date: 09/28/2021 Images from the original result were not included.   Prox RCA lesion is 99% stenosed.   Mid RCA to Dist RCA lesion is 99% stenosed.   Prox Cx to Mid Cx  lesion is 70% stenosed.   Prox LAD to Mid LAD lesion is 75% stenosed.   Prox LAD lesion is 75% stenosed.   Mid LAD lesion is 99% stenosed.   The left ventricular systolic function is normal.   LV end diastolic pressure is normal.   The left ventricular ejection fraction is 55-65% by visual estimate. MITCHELLE GOERNER is a 71 y.o. male  [id number] LOCATION:  FACILITY: [facility] PHYSICIAN: [name], M.D. 01-01-1951 DATE OF PROCEDURE:  09/28/2021 DATE OF DISCHARGE: CARDIAC CATHETERIZATION History obtained from chart review.NASIF BOS is a 71 y.o. male with hypertension, hyperlipidemia who is being seen 09/27/2021 for the evaluation of non-STEMI.   Mr. Cormier has severely calcified vessels.  He has a total diffusely diseased  RCA with left-to-right collaterals, long 75% calcified proximal LAD with a 95% calcified fairly focal mid LAD stenosis and 70% smooth calcified mid AV groove circumflex stenosis.  He has left-to-right collaterals and normal LV function.  I believe he is most suitable for complete revascularization with CABG.  The sheath was removed in his upper hemostatic wristband was placed to achieve patent hemostasis.  The patient left lab in stable condition.  Heparin will be restarted in 4 hours without a bolus.  He will be hydrated, T CTS will be consulted. [name]. MD, [facility] 09/28/2021 10:06 AM    DG Chest Portable 1 View  Result Date: 09/27/2021 CLINICAL DATA:  Squeezing chest pain with nausea since 4 days EXAM: PORTABLE CHEST 1 VIEW COMPARISON:  None Available. FINDINGS: The heart size and mediastinal contours are within normal limits. Both lungs are clear. The visualized skeletal structures are unremarkable. IMPRESSION: No active disease. Electronically Signed   By: Frazier Richards M.D.   On: 09/27/2021 10:48    Cardiac Studies   Echo: 09/28/21  IMPRESSIONS     1. Limited study due to poor echo windows.   2. Left ventricular ejection fraction, by estimation, is 60 to 65%. The  left  ventricle has normal function. The left ventricle has no regional  wall motion abnormalities. Left ventricular diastolic parameters are  consistent with Grade I diastolic  dysfunction (impaired relaxation).   3. Right ventricular systolic function is normal. The right ventricular  size is normal. Tricuspid regurgitation signal is inadequate for assessing  PA pressure.   4. Left atrial size was mildly dilated.   5. The mitral valve is grossly normal. Trivial mitral valve  regurgitation.   6. The aortic valve was not well visualized. Aortic valve regurgitation  is not visualized. No aortic stenosis is present.   7. The inferior vena cava is normal in size with greater than 50%  respiratory variability, suggesting right atrial pressure of 3 mmHg.   Comparison(s): No prior Echocardiogram.   FINDINGS   Left Ventricle: Left ventricular ejection fraction, by estimation, is 60  to 65%. The left ventricle has normal function. The left ventricle has no  regional wall motion abnormalities. Definity contrast agent was given IV  to delineate the left ventricular   endocardial borders. The left ventricular internal cavity size was normal  in size. There is no left ventricular hypertrophy. Left ventricular  diastolic parameters are consistent with Grade I diastolic dysfunction  (impaired relaxation).   Right Ventricle: The right ventricular size is normal. Right vetricular  wall thickness was not well visualized. Right ventricular systolic  function is normal. Tricuspid regurgitation signal is inadequate for  assessing PA pressure.   Left Atrium: Left atrial size was mildly dilated.   Right Atrium: Right atrial size was normal in size.   Pericardium: There is no evidence of pericardial effusion.   Mitral Valve: The mitral valve is grossly normal. Trivial mitral valve  regurgitation.   Tricuspid Valve: The tricuspid valve is normal in structure. Tricuspid  valve regurgitation is not  demonstrated.   Aortic Valve: The aortic valve was not well visualized. Aortic valve  regurgitation is not visualized. No aortic stenosis is present. Aortic  valve mean gradient measures 6.0 mmHg. Aortic valve peak gradient measures  10.4 mmHg. Aortic valve area, by VTI  measures 2.10 cm.   Pulmonic Valve: The pulmonic valve was not well visualized.   Aorta: The aortic root is normal in size and structure.   Venous: The  inferior vena cava is normal in size with greater than 50%  respiratory variability, suggesting right atrial pressure of 3 mmHg.   IAS/Shunts: The atrial septum is grossly normal.   Cath: 09/28/21    Prox RCA lesion is 99% stenosed.   Mid RCA to Dist RCA lesion is 99% stenosed.   Prox Cx to Mid Cx lesion is 70% stenosed.   Prox LAD to Mid LAD lesion is 75% stenosed.   Prox LAD lesion is 75% stenosed.   Mid LAD lesion is 99% stenosed.   The left ventricular systolic function is normal.   LV end diastolic pressure is normal.   The left ventricular ejection fraction is 55-65% by visual estimate.   IMPRESSION: Mr. Iovino has severely calcified vessels.  He has a total diffusely diseased RCA with left-to-right collaterals, long 75% calcified proximal LAD with a 95% calcified fairly focal mid LAD stenosis and 70% smooth calcified mid AV groove circumflex stenosis.  He has left-to-right collaterals and normal LV function.  I believe he is most suitable for complete revascularization with CABG.  The sheath was removed in his upper hemostatic wristband was placed to achieve patent hemostasis.  The patient left lab in stable condition.  Heparin will be restarted in 4 hours without a bolus.  He will be hydrated, T CTS will be consulted.   [name]. MD, [facility] 09/28/2021 10:06 AM  Diagnostic Dominance: Right   Patient Profile     71 y.o. male with PMH of HTN and HLD who presented with chest pain and found to have a NSTEMI.   Assessment & Plan    NSTEMI: hsTn  111>>243>>[id number]>>[id number].  Underwent cardiac catheterization noted above with diffusely diseased RCA with left-to-right collaterals, long 75% calcified proximal LAD with 95% calcified focal mid LAD stenosis and 70% mid AV groove circumflex stenosis.  Recommendations for TCTS evaluation for revascularization with CABG.  Seen by Dr. Darcey Nora with plans for CABG tomorrow 8/2 morning. --Continue IV heparin, nitro, aspirin, atorvastatin 80 mg daily, metoprolol 25 mg twice daily  HTN: Borderline elevated at times --Continue metoprolol 25 mg twice daily (unable to further titrate with bradycardia)  -- add amlodipine '5mg'$  daily   HLD: LDL 80, HDL 40 -- started on atorvastatin '80mg'$  daily on admission -- will need FLP/LFTs in 8 weeks   For questions or updates, please contact [place] Please consult [email or web address] for contact info under        Signed, Reino Bellis, NP  09/29/2021, 8:38 AM    I have personally seen and examined this patient. I agree with the assessment and plan as outlined above.  He stable today. Some chest pain overnight resolved with IV NTG titration. EKG with anterolateral TWI, sinus. BP is mildly elevated today. He is chest pain free.  Planning for CABG tomorrow.  Will add Norvasc today for better BP control.   Lauree Chandler, MD 09/29/2021 9:37 AM

## 2021-09-29 NOTE — Progress Notes (Signed)
ANTICOAGULATION CONSULT NOTE   Pharmacy Consult for Heparin Indication: chest pain/ACS  No Known Allergies  Patient Measurements: Height: '5\' 10"'$  (177.8 cm) Weight: 63.6 kg (140 lb 3.2 oz) IBW/kg (Calculated) : 73 kg Heparin Dosing Weight: 68 kg  Vital Signs: Temp: 98 F (36.7 C) (08/01 1334) Temp Source: Oral (08/01 1334) BP: 105/65 (08/01 1334) Pulse Rate: 57 (08/01 1334)  Labs: Recent Labs    09/27/21 1001 09/27/21 1204 09/27/21 1802 09/27/21 1824 09/27/21 2020 09/28/21 0235 09/29/21 0424 09/29/21 1227  HGB 13.1  --   --   --   --   --  10.9*  --   HCT 36.2*  --   --   --   --   --  30.1*  --   PLT 104*  --   --   --   --   --  82*  --   LABPROT 14.0  --   --   --   --   --   --   --   INR 1.1  --   --   --   --   --   --   --   HEPARINUNFRC  --   --   --    < >  --  0.39 0.23* 0.40  CREATININE 0.82  --   --   --   --  0.82 0.87  --   TROPONINIHS 111* 243* 1,052*  --  1,592*  --   --   --    < > = values in this interval not displayed.    Estimated Creatinine Clearance: 71.1 mL/min (by C-G formula based on SCr of 0.87 mg/dL).  Medical History: Past Medical History:  Diagnosis Date   Hypertension    Medications:  Medications Prior to Admission  Medication Sig Dispense Refill Last Dose   lisinopril (ZESTRIL) 20 MG tablet Take 1 tablet by mouth daily.   09/26/2021   pantoprazole (PROTONIX) 40 MG tablet TAKE 1 TABLET BY MOUTH EVERY DAY (Patient taking differently: Take 40 mg by mouth daily.) 90 tablet 1 09/26/2021   triamcinolone cream (KENALOG) 0.1 % Apply 1 Application topically daily as needed (For rash).   Past Week   triamcinolone ointment (KENALOG) 0.1 % Apply 1 Application topically daily as needed (For rash).   Past Week   Scheduled:   amLODipine  5 mg Oral Daily   aspirin  81 mg Oral Daily   atorvastatin  80 mg Oral Daily   [START ON 09/30/2021] epinephrine  0-10 mcg/min Intravenous To OR   [START ON 09/30/2021] heparin sodium (porcine) 2,500 Units,  papaverine 30 mg in electrolyte-A (PLASMALYTE-A PH 7.4) 500 mL irrigation   Irrigation To OR   [START ON 09/30/2021] insulin   Intravenous To OR   [START ON 09/30/2021] magnesium sulfate  40 mEq Other To OR   metoprolol tartrate  25 mg Oral BID   pantoprazole  40 mg Oral Daily   [START ON 09/30/2021] phenylephrine  30-200 mcg/min Intravenous To OR   [START ON 09/30/2021] potassium chloride  80 mEq Other To OR   sodium chloride flush  3 mL Intravenous Q12H   [START ON 09/30/2021] tranexamic acid  15 mg/kg Intravenous To OR   [START ON 09/30/2021] tranexamic acid  2 mg/kg Intracatheter To OR   Infusions:   sodium chloride     [START ON 09/30/2021]  ceFAZolin (ANCEF) IV     [START ON 09/30/2021]  ceFAZolin (ANCEF) IV     [  START ON 09/30/2021] dexmedetomidine     [START ON 09/30/2021] heparin 30,000 units/NS 1000 mL solution for CELLSAVER     heparin 1,000 Units/hr (09/29/21 0535)   [START ON 09/30/2021] milrinone     nitroGLYCERIN 20 mcg/min (09/29/21 1144)   [START ON 09/30/2021] nitroGLYCERIN     [START ON 09/30/2021] norepinephrine     [START ON 09/30/2021] tranexamic acid (CYKLOKAPRON) 2,500 mg in sodium chloride 0.9 % 250 mL (10 mg/mL) infusion     [START ON 09/30/2021] vancomycin     PRN: sodium chloride, acetaminophen, ALPRAZolam, morphine injection, nitroGLYCERIN, ondansetron (ZOFRAN) IV, sodium chloride flush, zolpidem  Assessment: 70 yom with presenting with chest pain. Heparin per pharmacy consult placed for chest pain/ACS. Patient was not on anticoagulation prior to arrival. Patient had cardiac catheterization on 7/31 and planning for CABG procedure 8/2. Heparin level is therapeutic at 0.4 with heparin running at 1000 units/hr. No issues with infusion or bleeding noted.  Goal of Therapy:  Heparin level 0.3-0.7 units/ml Monitor platelets by anticoagulation protocol: Yes   Plan:  Continue heparin 1000 units/hr. Daily CBC, heparin level. Monitor for signs/symptoms of bleeding.   Jeneen Rinks,  Pharm.D PGY1 Pharmacy Resident 09/29/2021 2:03 PM

## 2021-09-29 NOTE — Progress Notes (Signed)
1 Day Post-Op Procedure(s) (LRB): LEFT HEART CATH AND CORONARY ANGIOGRAPHY (N/A) Subjective: No chest pain today with increased dose of IV nitroglycerin Pre-CABG Dopplers show no significant carotid disease with adequate ABI Plan multivessel CABG in a.m. for non-STEMI and three-vessel CAD I discussed the procedure benefits and risks with the patient and his wife in the room today.  They understand his vessels are extremely calcified but surgical revascularization is his best long-term therapy.  Objective: Vital signs in last 24 hours: Temp:  [97.8 F (36.6 C)-98.3 F (36.8 C)] 98.3 F (36.8 C) (08/01 1531) Pulse Rate:  [55-89] 73 (08/01 1823) Cardiac Rhythm: Normal sinus rhythm (08/01 1500) Resp:  [17-20] 18 (08/01 1823) BP: (101-162)/(61-90) 102/65 (08/01 1823) SpO2:  [98 %-100 %] 100 % (08/01 1823)  Hemodynamic parameters for last 24 hours:    Intake/Output from previous day: 07/31 0701 - 08/01 0700 In: 1008.6 [P.O.:120; I.V.:888.6] Out: 400 [Urine:400] Intake/Output this shift: Total I/O In: 12.8 [I.V.:12.8] Out: -   Exam Alert and appropriate Lungs clear Regular rhythm No hematoma at cath site Neuro intact  Lab Results: Recent Labs    09/27/21 1001 09/29/21 0424  WBC 5.3 5.4  HGB 13.1 10.9*  HCT 36.2* 30.1*  PLT 104* 82*   BMET:  Recent Labs    09/28/21 0235 09/29/21 0424  NA 134* 135  K 3.8 3.4*  CL 105 107  CO2 20* 20*  GLUCOSE 130* 118*  BUN 7* 8  CREATININE 0.82 0.87  CALCIUM 9.1 9.1    PT/INR:  Recent Labs    09/27/21 1001  LABPROT 14.0  INR 1.1   ABG    Component Value Date/Time   PHART 7.42 09/29/2021 1300   HCO3 21.4 09/29/2021 1300   ACIDBASEDEF 2.3 (H) 09/29/2021 1300   O2SAT 99.2 09/29/2021 1300   CBG (last 3)  No results for input(s): "GLUCAP" in the last 72 hours.  Assessment/Plan: S/P Procedure(s) (LRB): LEFT HEART CATH AND CORONARY ANGIOGRAPHY (N/A) Plan CABG x3 in a.m.   LOS: 2 days    Dahlia Byes 09/29/2021

## 2021-09-29 NOTE — Progress Notes (Signed)
ANTICOAGULATION CONSULT NOTE   Pharmacy Consult for Heparin Indication: chest pain/ACS  No Known Allergies  Patient Measurements: Height: '5\' 10"'$  (177.8 cm) Weight: 63.6 kg (140 lb 3.2 oz) IBW/kg (Calculated) : 73 Heparin Dosing Weight: 68 kg  Vital Signs: Temp: 97.8 F (36.6 C) (07/31 1931) Temp Source: Oral (08/01 0435) BP: 143/78 (08/01 0435) Pulse Rate: 73 (08/01 0435)  Labs: Recent Labs    09/27/21 1001 09/27/21 1204 09/27/21 1802 09/27/21 1824 09/27/21 2020 09/28/21 0235 09/29/21 0424  HGB 13.1  --   --   --   --   --  10.9*  HCT 36.2*  --   --   --   --   --  30.1*  PLT 104*  --   --   --   --   --  82*  LABPROT 14.0  --   --   --   --   --   --   INR 1.1  --   --   --   --   --   --   HEPARINUNFRC  --   --   --  0.22*  --  0.39 0.23*  CREATININE 0.82  --   --   --   --  0.82  --   TROPONINIHS 111* 243* 1,052*  --  1,592*  --   --      Estimated Creatinine Clearance: 75.4 mL/min (by C-G formula based on SCr of 0.82 mg/dL).   Medical History: Past Medical History:  Diagnosis Date   Hypertension     Medications:  Medications Prior to Admission  Medication Sig Dispense Refill Last Dose   lisinopril (ZESTRIL) 20 MG tablet Take 1 tablet by mouth daily.   09/26/2021   pantoprazole (PROTONIX) 40 MG tablet TAKE 1 TABLET BY MOUTH EVERY DAY (Patient taking differently: Take 40 mg by mouth daily.) 90 tablet 1 09/26/2021   triamcinolone cream (KENALOG) 0.1 % Apply 1 Application topically daily as needed (For rash).   Past Week   triamcinolone ointment (KENALOG) 0.1 % Apply 1 Application topically daily as needed (For rash).   Past Week    Scheduled:   aspirin  81 mg Oral Daily   atorvastatin  80 mg Oral Daily   metoprolol tartrate  25 mg Oral BID   pantoprazole  40 mg Oral Daily   sodium chloride flush  3 mL Intravenous Q12H   Infusions:   sodium chloride     heparin 900 Units/hr (09/28/21 2125)   nitroGLYCERIN 25 mcg/min (09/28/21 2324)   PRN: sodium  chloride, acetaminophen, ALPRAZolam, morphine injection, nitroGLYCERIN, ondansetron (ZOFRAN) IV, sodium chloride flush, zolpidem  Assessment: 70 yom with presenting with chest pain. Heparin per pharmacy consult placed for chest pain/ACS. Patient was not on anticoagulation prior to arrival.  Heparin started at 800 units/hr following 4000 unit IV bolus. Heparin level is back at 0.22 which is sub-therapeutic.  No issues with infusion or bleeding per RN  Hgb 13.1; plt 104  8/1 AM update:  Heparin level low after re-start s/p cath Tentative CABG  Goal of Therapy:  Heparin level 0.3-0.7 units/ml Monitor platelets by anticoagulation protocol: Yes   Plan:  Inc heparin to 1000 units/hr 1300 heparin level  Narda Bonds, PharmD, BCPS Clinical Pharmacist Phone: [phone]

## 2021-09-29 NOTE — Progress Notes (Signed)
Bilateral lower extremity venous duplex has been completed. Preliminary results can be found in CV Proc through chart review.   09/29/21 2:02 PM Timothy [name] RVT

## 2021-09-29 NOTE — Progress Notes (Signed)
CARDIAC REHAB PHASE I     Pt pre-op open heart teaching including IS use, move in the tub, OHS handout, OHS booklet, discharge home needs, early ambulation, pain management, and sternal precautions. Pt demonstrated IS use at 2200 today.  All questions and concerns addressed. Plan for surgery tomorrow am.Will continue to follow post op.   [phone] Vanessa Barbara, RN BSN 09/29/2021 10:47 AM

## 2021-09-29 NOTE — Care Management (Signed)
  Transition of Care [facility]) Screening Note   Patient Details  Name: Timothy [name] Date of Birth: 1951-02-20   Transition of Care [facility]) CM/SW Contact:    Bethena Roys, RN Phone Number: 09/29/2021, 12:25 PM    Transition of Care Department [facility]) has reviewed the patient and no TOC needs have been identified at this time. Patient is planned for CABG 09-30-21- Case Manager will continue to follow for disposition needs as the patient progresses.

## 2021-09-30 ENCOUNTER — Inpatient Hospital Stay (HOSPITAL_COMMUNITY): Payer: Medicare Other

## 2021-09-30 ENCOUNTER — Inpatient Hospital Stay (HOSPITAL_COMMUNITY): Payer: Medicare Other | Admitting: Certified Registered Nurse Anesthetist

## 2021-09-30 ENCOUNTER — Inpatient Hospital Stay (HOSPITAL_COMMUNITY)
Admission: EM | Disposition: A | Discharge status: Home Health | Payer: Self-pay | Source: Home / Self Care | Attending: Cardiothoracic Surgery

## 2021-09-30 ENCOUNTER — Other Ambulatory Visit: Payer: Self-pay

## 2021-09-30 DIAGNOSIS — Z951 Presence of aortocoronary bypass graft: Secondary | ICD-10-CM

## 2021-09-30 DIAGNOSIS — I25110 Atherosclerotic heart disease of native coronary artery with unstable angina pectoris: Secondary | ICD-10-CM

## 2021-09-30 DIAGNOSIS — I214 Non-ST elevation (NSTEMI) myocardial infarction: Secondary | ICD-10-CM

## 2021-09-30 DIAGNOSIS — I10 Essential (primary) hypertension: Secondary | ICD-10-CM

## 2021-09-30 DIAGNOSIS — D649 Anemia, unspecified: Secondary | ICD-10-CM

## 2021-09-30 HISTORY — PX: CORONARY ARTERY BYPASS GRAFT: SHX141

## 2021-09-30 HISTORY — PX: TEE WITHOUT CARDIOVERSION: SHX5443

## 2021-09-30 LAB — POCT I-STAT, CHEM 8
BUN: 6 mg/dL — ABNORMAL LOW (ref 8–23)
BUN: 6 mg/dL — ABNORMAL LOW (ref 8–23)
BUN: 6 mg/dL — ABNORMAL LOW (ref 8–23)
BUN: 5 mg/dL — ABNORMAL LOW (ref 8–23)
BUN: 5 mg/dL — ABNORMAL LOW (ref 8–23)
Calcium, Ion: 1.25 mmol/L (ref 1.15–1.40)
Calcium, Ion: 1.27 mmol/L (ref 1.15–1.40)
Calcium, Ion: 1.1 mmol/L — ABNORMAL LOW (ref 1.15–1.40)
Calcium, Ion: 1.13 mmol/L — ABNORMAL LOW (ref 1.15–1.40)
Calcium, Ion: 1.26 mmol/L (ref 1.15–1.40)
Chloride: 101 mmol/L (ref 98–111)
Chloride: 102 mmol/L (ref 98–111)
Chloride: 102 mmol/L (ref 98–111)
Chloride: 102 mmol/L (ref 98–111)
Chloride: 103 mmol/L (ref 98–111)
Creatinine, Ser: 0.6 mg/dL — ABNORMAL LOW (ref 0.61–1.24)
Creatinine, Ser: 0.7 mg/dL (ref 0.61–1.24)
Creatinine, Ser: 0.6 mg/dL — ABNORMAL LOW (ref 0.61–1.24)
Creatinine, Ser: 0.6 mg/dL — ABNORMAL LOW (ref 0.61–1.24)
Creatinine, Ser: 0.5 mg/dL — ABNORMAL LOW (ref 0.61–1.24)
Glucose, Bld: 117 mg/dL — ABNORMAL HIGH (ref 70–99)
Glucose, Bld: 111 mg/dL — ABNORMAL HIGH (ref 70–99)
Glucose, Bld: 137 mg/dL — ABNORMAL HIGH (ref 70–99)
Glucose, Bld: 129 mg/dL — ABNORMAL HIGH (ref 70–99)
Glucose, Bld: 108 mg/dL — ABNORMAL HIGH (ref 70–99)
HCT: 28 % — ABNORMAL LOW (ref 39.0–52.0)
HCT: 28 % — ABNORMAL LOW (ref 39.0–52.0)
HCT: 25 % — ABNORMAL LOW (ref 39.0–52.0)
HCT: 24 % — ABNORMAL LOW (ref 39.0–52.0)
HCT: 21 % — ABNORMAL LOW (ref 39.0–52.0)
Hemoglobin: 9.5 g/dL — ABNORMAL LOW (ref 13.0–17.0)
Hemoglobin: 9.5 g/dL — ABNORMAL LOW (ref 13.0–17.0)
Hemoglobin: 8.5 g/dL — ABNORMAL LOW (ref 13.0–17.0)
Hemoglobin: 8.2 g/dL — ABNORMAL LOW (ref 13.0–17.0)
Hemoglobin: 7.1 g/dL — ABNORMAL LOW (ref 13.0–17.0)
Potassium: 3.8 mmol/L (ref 3.5–5.1)
Potassium: 4 mmol/L (ref 3.5–5.1)
Potassium: 5.3 mmol/L — ABNORMAL HIGH (ref 3.5–5.1)
Potassium: 5.1 mmol/L (ref 3.5–5.1)
Potassium: 4.1 mmol/L (ref 3.5–5.1)
Sodium: 135 mmol/L (ref 135–145)
Sodium: 136 mmol/L (ref 135–145)
Sodium: 135 mmol/L (ref 135–145)
Sodium: 136 mmol/L (ref 135–145)
Sodium: 138 mmol/L (ref 135–145)
TCO2: 24 mmol/L (ref 22–32)
TCO2: 24 mmol/L (ref 22–32)
TCO2: 25 mmol/L (ref 22–32)
TCO2: 25 mmol/L (ref 22–32)
TCO2: 25 mmol/L (ref 22–32)

## 2021-09-30 LAB — POCT I-STAT 7, (LYTES, BLD GAS, ICA,H+H)
Acid-Base Excess: 1 mmol/L (ref 0.0–2.0)
Acid-Base Excess: 1 mmol/L (ref 0.0–2.0)
Acid-Base Excess: 0 mmol/L (ref 0.0–2.0)
Acid-Base Excess: 0 mmol/L (ref 0.0–2.0)
Acid-base deficit: 1 mmol/L (ref 0.0–2.0)
Acid-base deficit: 4 mmol/L — ABNORMAL HIGH (ref 0.0–2.0)
Acid-base deficit: 4 mmol/L — ABNORMAL HIGH (ref 0.0–2.0)
Bicarbonate: 24 mmol/L (ref 20.0–28.0)
Bicarbonate: 25.7 mmol/L (ref 20.0–28.0)
Bicarbonate: 25.6 mmol/L (ref 20.0–28.0)
Bicarbonate: 24.8 mmol/L (ref 20.0–28.0)
Bicarbonate: 24.6 mmol/L (ref 20.0–28.0)
Bicarbonate: 22.4 mmol/L (ref 20.0–28.0)
Bicarbonate: 22.3 mmol/L (ref 20.0–28.0)
Calcium, Ion: 1.3 mmol/L (ref 1.15–1.40)
Calcium, Ion: 1.1 mmol/L — ABNORMAL LOW (ref 1.15–1.40)
Calcium, Ion: 1.11 mmol/L — ABNORMAL LOW (ref 1.15–1.40)
Calcium, Ion: 1.27 mmol/L (ref 1.15–1.40)
Calcium, Ion: 1.15 mmol/L (ref 1.15–1.40)
Calcium, Ion: 1.21 mmol/L (ref 1.15–1.40)
Calcium, Ion: 1.18 mmol/L (ref 1.15–1.40)
HCT: 31 % — ABNORMAL LOW (ref 39.0–52.0)
HCT: 22 % — ABNORMAL LOW (ref 39.0–52.0)
HCT: 25 % — ABNORMAL LOW (ref 39.0–52.0)
HCT: 23 % — ABNORMAL LOW (ref 39.0–52.0)
HCT: 21 % — ABNORMAL LOW (ref 39.0–52.0)
HCT: 25 % — ABNORMAL LOW (ref 39.0–52.0)
HCT: 22 % — ABNORMAL LOW (ref 39.0–52.0)
Hemoglobin: 10.5 g/dL — ABNORMAL LOW (ref 13.0–17.0)
Hemoglobin: 7.5 g/dL — ABNORMAL LOW (ref 13.0–17.0)
Hemoglobin: 8.5 g/dL — ABNORMAL LOW (ref 13.0–17.0)
Hemoglobin: 7.8 g/dL — ABNORMAL LOW (ref 13.0–17.0)
Hemoglobin: 7.1 g/dL — ABNORMAL LOW (ref 13.0–17.0)
Hemoglobin: 8.5 g/dL — ABNORMAL LOW (ref 13.0–17.0)
Hemoglobin: 7.5 g/dL — ABNORMAL LOW (ref 13.0–17.0)
O2 Saturation: 100 %
O2 Saturation: 100 %
O2 Saturation: 100 %
O2 Saturation: 100 %
O2 Saturation: 100 %
O2 Saturation: 99 %
O2 Saturation: 99 %
Patient temperature: 35.9
Patient temperature: 36.2
Patient temperature: 36.3
Potassium: 3.8 mmol/L (ref 3.5–5.1)
Potassium: 4.5 mmol/L (ref 3.5–5.1)
Potassium: 4.7 mmol/L (ref 3.5–5.1)
Potassium: 4.1 mmol/L (ref 3.5–5.1)
Potassium: 3.6 mmol/L (ref 3.5–5.1)
Potassium: 4.3 mmol/L (ref 3.5–5.1)
Potassium: 4 mmol/L (ref 3.5–5.1)
Sodium: 134 mmol/L — ABNORMAL LOW (ref 135–145)
Sodium: 136 mmol/L (ref 135–145)
Sodium: 135 mmol/L (ref 135–145)
Sodium: 138 mmol/L (ref 135–145)
Sodium: 138 mmol/L (ref 135–145)
Sodium: 139 mmol/L (ref 135–145)
Sodium: 139 mmol/L (ref 135–145)
TCO2: 25 mmol/L (ref 22–32)
TCO2: 27 mmol/L (ref 22–32)
TCO2: 27 mmol/L (ref 22–32)
TCO2: 26 mmol/L (ref 22–32)
TCO2: 26 mmol/L (ref 22–32)
TCO2: 24 mmol/L (ref 22–32)
TCO2: 24 mmol/L (ref 22–32)
pCO2 arterial: 40 mmHg (ref 32–48)
pCO2 arterial: 41.2 mmHg (ref 32–48)
pCO2 arterial: 39.9 mmHg (ref 32–48)
pCO2 arterial: 38.2 mmHg (ref 32–48)
pCO2 arterial: 34.6 mmHg (ref 32–48)
pCO2 arterial: 42.3 mmHg (ref 32–48)
pCO2 arterial: 42.9 mmHg (ref 32–48)
pH, Arterial: 7.386 (ref 7.35–7.45)
pH, Arterial: 7.404 (ref 7.35–7.45)
pH, Arterial: 7.415 (ref 7.35–7.45)
pH, Arterial: 7.42 (ref 7.35–7.45)
pH, Arterial: 7.455 — ABNORMAL HIGH (ref 7.35–7.45)
pH, Arterial: 7.327 — ABNORMAL LOW (ref 7.35–7.45)
pH, Arterial: 7.32 — ABNORMAL LOW (ref 7.35–7.45)
pO2, Arterial: 505 mmHg — ABNORMAL HIGH (ref 83–108)
pO2, Arterial: 465 mmHg — ABNORMAL HIGH (ref 83–108)
pO2, Arterial: 387 mmHg — ABNORMAL HIGH (ref 83–108)
pO2, Arterial: 449 mmHg — ABNORMAL HIGH (ref 83–108)
pO2, Arterial: 193 mmHg — ABNORMAL HIGH (ref 83–108)
pO2, Arterial: 162 mmHg — ABNORMAL HIGH (ref 83–108)
pO2, Arterial: 125 mmHg — ABNORMAL HIGH (ref 83–108)

## 2021-09-30 LAB — CBC
HCT: 27 % — ABNORMAL LOW (ref 39.0–52.0)
HCT: 22.4 % — ABNORMAL LOW (ref 39.0–52.0)
HCT: 23.6 % — ABNORMAL LOW (ref 39.0–52.0)
Hemoglobin: 9.5 g/dL — ABNORMAL LOW (ref 13.0–17.0)
Hemoglobin: 8 g/dL — ABNORMAL LOW (ref 13.0–17.0)
Hemoglobin: 8.2 g/dL — ABNORMAL LOW (ref 13.0–17.0)
MCH: 35.3 pg — ABNORMAL HIGH (ref 26.0–34.0)
MCH: 35.4 pg — ABNORMAL HIGH (ref 26.0–34.0)
MCH: 34 pg (ref 26.0–34.0)
MCHC: 35.2 g/dL (ref 30.0–36.0)
MCHC: 35.7 g/dL (ref 30.0–36.0)
MCHC: 34.7 g/dL (ref 30.0–36.0)
MCV: 100.4 fL — ABNORMAL HIGH (ref 80.0–100.0)
MCV: 99.1 fL (ref 80.0–100.0)
MCV: 97.9 fL (ref 80.0–100.0)
Platelets: 72 10*3/uL — ABNORMAL LOW (ref 150–400)
Platelets: 60 10*3/uL — ABNORMAL LOW (ref 150–400)
Platelets: 58 10*3/uL — ABNORMAL LOW (ref 150–400)
RBC: 2.69 MIL/uL — ABNORMAL LOW (ref 4.22–5.81)
RBC: 2.26 MIL/uL — ABNORMAL LOW (ref 4.22–5.81)
RBC: 2.41 MIL/uL — ABNORMAL LOW (ref 4.22–5.81)
RDW: 11.7 % (ref 11.5–15.5)
RDW: 13.8 % (ref 11.5–15.5)
RDW: 14.6 % (ref 11.5–15.5)
WBC: 5.3 10*3/uL (ref 4.0–10.5)
WBC: 5.1 10*3/uL (ref 4.0–10.5)
WBC: 6 10*3/uL (ref 4.0–10.5)
nRBC: 0 % (ref 0.0–0.2)
nRBC: 0 % (ref 0.0–0.2)
nRBC: 0 % (ref 0.0–0.2)

## 2021-09-30 LAB — MAGNESIUM: Magnesium: 2.7 mg/dL — ABNORMAL HIGH (ref 1.7–2.4)

## 2021-09-30 LAB — BASIC METABOLIC PANEL
Anion gap: 6 (ref 5–15)
Anion gap: 8 (ref 5–15)
BUN: 8 mg/dL (ref 8–23)
BUN: 8 mg/dL (ref 8–23)
CO2: 22 mmol/L (ref 22–32)
CO2: 20 mmol/L — ABNORMAL LOW (ref 22–32)
Calcium: 8.8 mg/dL — ABNORMAL LOW (ref 8.9–10.3)
Calcium: 7.7 mg/dL — ABNORMAL LOW (ref 8.9–10.3)
Chloride: 104 mmol/L (ref 98–111)
Chloride: 107 mmol/L (ref 98–111)
Creatinine, Ser: 0.8 mg/dL (ref 0.61–1.24)
Creatinine, Ser: 0.83 mg/dL (ref 0.61–1.24)
GFR, Estimated: >60 mL/min (ref >60)
GFR, Estimated: >60 mL/min (ref >60)
Glucose, Bld: 116 mg/dL — ABNORMAL HIGH (ref 70–99)
Glucose, Bld: 137 mg/dL — ABNORMAL HIGH (ref 70–99)
Potassium: 3.9 mmol/L (ref 3.5–5.1)
Potassium: 4.2 mmol/L (ref 3.5–5.1)
Sodium: 132 mmol/L — ABNORMAL LOW (ref 135–145)
Sodium: 135 mmol/L (ref 135–145)

## 2021-09-30 LAB — GLUCOSE, CAPILLARY
Glucose-Capillary: 139 mg/dL — ABNORMAL HIGH (ref 70–99)
Glucose-Capillary: 149 mg/dL — ABNORMAL HIGH (ref 70–99)
Glucose-Capillary: 145 mg/dL — ABNORMAL HIGH (ref 70–99)
Glucose-Capillary: 155 mg/dL — ABNORMAL HIGH (ref 70–99)
Glucose-Capillary: 149 mg/dL — ABNORMAL HIGH (ref 70–99)
Glucose-Capillary: 154 mg/dL — ABNORMAL HIGH (ref 70–99)
Glucose-Capillary: 142 mg/dL — ABNORMAL HIGH (ref 70–99)
Glucose-Capillary: 167 mg/dL — ABNORMAL HIGH (ref 70–99)
Glucose-Capillary: 160 mg/dL — ABNORMAL HIGH (ref 70–99)

## 2021-09-30 LAB — POCT I-STAT EG7
Acid-Base Excess: 7 mmol/L — ABNORMAL HIGH (ref 0.0–2.0)
Bicarbonate: 31.2 mmol/L — ABNORMAL HIGH (ref 20.0–28.0)
Calcium, Ion: 1.08 mmol/L — ABNORMAL LOW (ref 1.15–1.40)
HCT: 22 % — ABNORMAL LOW (ref 39.0–52.0)
Hemoglobin: 7.5 g/dL — ABNORMAL LOW (ref 13.0–17.0)
O2 Saturation: 77 %
Potassium: 4.2 mmol/L (ref 3.5–5.1)
Sodium: 142 mmol/L (ref 135–145)
TCO2: 33 mmol/L — ABNORMAL HIGH (ref 22–32)
pCO2, Ven: 44.6 mmHg (ref 44–60)
pH, Ven: 7.453 — ABNORMAL HIGH (ref 7.25–7.43)
pO2, Ven: 40 mmHg (ref 32–45)

## 2021-09-30 LAB — PROTIME-INR
INR: 1.4 — ABNORMAL HIGH (ref 0.8–1.2)
Prothrombin Time: 16.6 seconds — ABNORMAL HIGH (ref 11.4–15.2)

## 2021-09-30 LAB — APTT: aPTT: 27 seconds (ref 24–36)

## 2021-09-30 LAB — HEMOGLOBIN AND HEMATOCRIT, BLOOD
HCT: 22.7 % — ABNORMAL LOW (ref 39.0–52.0)
Hemoglobin: 8.2 g/dL — ABNORMAL LOW (ref 13.0–17.0)

## 2021-09-30 LAB — FIBRINOGEN: Fibrinogen: 161 mg/dL — ABNORMAL LOW (ref 210–475)

## 2021-09-30 LAB — HEPARIN LEVEL (UNFRACTIONATED): Heparin Unfractionated: 0.24 IU/mL — ABNORMAL LOW (ref 0.30–0.70)

## 2021-09-30 LAB — PREPARE RBC (CROSSMATCH)

## 2021-09-30 LAB — PLATELET COUNT: Platelets: 54 10*3/uL — ABNORMAL LOW (ref 150–400)

## 2021-09-30 SURGERY — CORONARY ARTERY BYPASS GRAFTING (CABG)
Anesthesia: General | Site: Esophagus

## 2021-09-30 MED ORDER — SODIUM CHLORIDE 0.45 % IV SOLN: INTRAVENOUS | Status: DC | PRN

## 2021-09-30 MED ORDER — BISACODYL 5 MG PO TBEC
10.0000 mg | DELAYED_RELEASE_TABLET | Freq: Every day | ORAL | Status: DC
  Administered 2021-10-01 – 2021-10-02 (×2): 10 mg via ORAL
  Filled 2021-09-30 (×4): qty 2

## 2021-09-30 MED ORDER — PROTAMINE SULFATE 10 MG/ML IV SOLN
INTRAVENOUS | Status: AC
  Filled 2021-09-30: qty 25

## 2021-09-30 MED ORDER — ASPIRIN 81 MG PO CHEW
324.0000 mg | CHEWABLE_TABLET | Freq: Every day | ORAL | Status: DC
Start: 2021-10-01 — End: 2021-09-30

## 2021-09-30 MED ORDER — NITROGLYCERIN IN D5W 200-5 MCG/ML-% IV SOLN: 0.0000 ug/min | INTRAVENOUS | Status: DC

## 2021-09-30 MED ORDER — ACETAMINOPHEN 160 MG/5ML PO SOLN: 650.0000 mg | Freq: Once | ORAL | Status: AC

## 2021-09-30 MED ORDER — FENTANYL CITRATE PF 50 MCG/ML IJ SOSY
50.0000 ug | PREFILLED_SYRINGE | INTRAMUSCULAR | Status: DC | PRN
  Administered 2021-09-30 (×2): 50 ug via INTRAVENOUS
  Filled 2021-09-30 (×2): qty 1

## 2021-09-30 MED ORDER — MORPHINE SULFATE (PF) 2 MG/ML IV SOLN: 1.0000 mg | INTRAVENOUS | Status: DC | PRN

## 2021-09-30 MED ORDER — ARTIFICIAL TEARS OPHTHALMIC OINT
TOPICAL_OINTMENT | OPHTHALMIC | Status: DC | PRN
  Administered 2021-09-30: 1 via OPHTHALMIC

## 2021-09-30 MED ORDER — ORAL CARE MOUTH RINSE: 15.0000 mL | OROMUCOSAL | Status: DC | PRN

## 2021-09-30 MED ORDER — PROTAMINE SULFATE 10 MG/ML IV SOLN
INTRAVENOUS | Status: DC | PRN
  Administered 2021-09-30: 230 mg via INTRAVENOUS

## 2021-09-30 MED ORDER — SODIUM CHLORIDE 0.9% FLUSH
3.0000 mL | Freq: Two times a day (BID) | INTRAVENOUS | Status: DC
Start: 2021-10-01 — End: 2021-10-03
  Administered 2021-10-01 – 2021-10-02 (×4): 3 mL via INTRAVENOUS

## 2021-09-30 MED ORDER — SODIUM CHLORIDE 0.9% IV SOLUTION: Freq: Once | INTRAVENOUS | Status: DC

## 2021-09-30 MED ORDER — MIDAZOLAM HCL 2 MG/2ML IJ SOLN: 2.0000 mg | INTRAMUSCULAR | Status: DC | PRN

## 2021-09-30 MED ORDER — ASPIRIN 325 MG PO TBEC
325.0000 mg | DELAYED_RELEASE_TABLET | Freq: Every day | ORAL | Status: DC
Start: 2021-10-01 — End: 2021-09-30

## 2021-09-30 MED ORDER — POTASSIUM CHLORIDE 10 MEQ/50ML IV SOLN
10.0000 meq | INTRAVENOUS | Status: AC
  Administered 2021-09-30 (×3): 10 meq via INTRAVENOUS

## 2021-09-30 MED ORDER — MIDAZOLAM HCL (PF) 10 MG/2ML IJ SOLN
INTRAMUSCULAR | Status: AC
  Filled 2021-09-30: qty 2

## 2021-09-30 MED ORDER — CHLORHEXIDINE GLUCONATE 0.12 % MT SOLN
15.0000 mL | OROMUCOSAL | Status: AC
  Administered 2021-09-30: 15 mL via OROMUCOSAL
  Filled 2021-09-30: qty 15

## 2021-09-30 MED ORDER — MAGNESIUM SULFATE 4 GM/100ML IV SOLN
4.0000 g | Freq: Once | INTRAVENOUS | Status: AC
  Administered 2021-09-30: 4 g via INTRAVENOUS
  Filled 2021-09-30: qty 100

## 2021-09-30 MED ORDER — FENTANYL CITRATE (PF) 250 MCG/5ML IJ SOLN
INTRAMUSCULAR | Status: AC
  Filled 2021-09-30: qty 5

## 2021-09-30 MED ORDER — PHENYLEPHRINE HCL-NACL 20-0.9 MG/250ML-% IV SOLN
0.0000 ug/min | INTRAVENOUS | Status: DC
  Administered 2021-09-30: 20 ug/min via INTRAVENOUS
  Administered 2021-09-30: 17 ug/min via INTRAVENOUS
  Filled 2021-09-30: qty 250

## 2021-09-30 MED ORDER — BISACODYL 10 MG RE SUPP: 10.0000 mg | Freq: Every day | RECTAL | Status: DC

## 2021-09-30 MED ORDER — ONDANSETRON HCL 4 MG/2ML IJ SOLN
4.0000 mg | Freq: Four times a day (QID) | INTRAMUSCULAR | Status: DC | PRN
Start: 2021-09-30 — End: 2021-10-05
  Administered 2021-10-01: 4 mg via INTRAVENOUS
  Filled 2021-09-30: qty 2

## 2021-09-30 MED ORDER — DEXMEDETOMIDINE HCL IN NACL 400 MCG/100ML IV SOLN: 0.0000 ug/kg/h | INTRAVENOUS | Status: DC

## 2021-09-30 MED ORDER — ASPIRIN 81 MG PO TBEC
81.0000 mg | DELAYED_RELEASE_TABLET | Freq: Every day | ORAL | Status: DC
  Administered 2021-10-01: 81 mg via ORAL
  Filled 2021-09-30: qty 1

## 2021-09-30 MED ORDER — LACTATED RINGERS IV SOLN: 500.0000 mL | Freq: Once | INTRAVENOUS | Status: DC | PRN

## 2021-09-30 MED ORDER — LACTATED RINGERS IV SOLN: INTRAVENOUS | Status: DC | PRN

## 2021-09-30 MED ORDER — SODIUM CHLORIDE 0.9% FLUSH: 3.0000 mL | INTRAVENOUS | Status: DC | PRN

## 2021-09-30 MED ORDER — OXYCODONE HCL 5 MG PO TABS
5.0000 mg | ORAL_TABLET | ORAL | Status: DC | PRN
  Administered 2021-09-30 – 2021-10-02 (×8): 10 mg via ORAL
  Filled 2021-09-30 (×8): qty 2

## 2021-09-30 MED ORDER — INSULIN REGULAR(HUMAN) IN NACL 100-0.9 UT/100ML-% IV SOLN: INTRAVENOUS | Status: DC

## 2021-09-30 MED ORDER — LACTATED RINGERS IV SOLN
INTRAVENOUS | Status: DC
Start: 2021-09-30 — End: 2021-10-01

## 2021-09-30 MED ORDER — ORAL CARE MOUTH RINSE
15.0000 mL | OROMUCOSAL | Status: DC | PRN
Start: 2021-09-30 — End: 2021-09-30

## 2021-09-30 MED ORDER — METOPROLOL TARTRATE 25 MG/10 ML ORAL SUSPENSION: 12.5000 mg | Freq: Two times a day (BID) | ORAL | Status: DC

## 2021-09-30 MED ORDER — ACETAMINOPHEN 160 MG/5ML PO SOLN: 1000.0000 mg | Freq: Four times a day (QID) | ORAL | Status: DC

## 2021-09-30 MED ORDER — DOCUSATE SODIUM 100 MG PO CAPS
200.0000 mg | ORAL_CAPSULE | Freq: Every day | ORAL | Status: DC
  Administered 2021-10-01 – 2021-10-02 (×2): 200 mg via ORAL
  Filled 2021-09-30 (×5): qty 2

## 2021-09-30 MED ORDER — HEPARIN SODIUM (PORCINE) 1000 UNIT/ML IJ SOLN
INTRAMUSCULAR | Status: AC
  Filled 2021-09-30: qty 1

## 2021-09-30 MED ORDER — SODIUM CHLORIDE 0.9 % IV SOLN
INTRAVENOUS | Status: DC
Start: 2021-09-30 — End: 2021-10-01

## 2021-09-30 MED ORDER — PHENYLEPHRINE 80 MCG/ML (10ML) SYRINGE FOR IV PUSH (FOR BLOOD PRESSURE SUPPORT)
PREFILLED_SYRINGE | INTRAVENOUS | Status: AC
Start: 2021-09-30 — End: ?
  Filled 2021-09-30: qty 10

## 2021-09-30 MED ORDER — FAMOTIDINE IN NACL 20-0.9 MG/50ML-% IV SOLN
20.0000 mg | Freq: Two times a day (BID) | INTRAVENOUS | Status: AC
  Administered 2021-09-30 (×2): 20 mg via INTRAVENOUS
  Filled 2021-09-30 (×2): qty 50

## 2021-09-30 MED ORDER — ROCURONIUM BROMIDE 10 MG/ML (PF) SYRINGE
PREFILLED_SYRINGE | INTRAVENOUS | Status: DC | PRN
  Administered 2021-09-30 (×2): 50 mg via INTRAVENOUS
  Administered 2021-09-30: 100 mg via INTRAVENOUS

## 2021-09-30 MED ORDER — NOREPINEPHRINE 4 MG/250ML-% IV SOLN
0.0000 ug/min | INTRAVENOUS | Status: DC
  Administered 2021-09-30: 2 ug/min via INTRAVENOUS
  Administered 2021-10-01: 5 ug/min via INTRAVENOUS
  Filled 2021-09-30 (×2): qty 250

## 2021-09-30 MED ORDER — VANCOMYCIN HCL IN DEXTROSE 1-5 GM/200ML-% IV SOLN
1000.0000 mg | Freq: Once | INTRAVENOUS | Status: AC
  Administered 2021-09-30: 1000 mg via INTRAVENOUS

## 2021-09-30 MED ORDER — ALBUMIN HUMAN 5 % IV SOLN
250.0000 mL | INTRAVENOUS | Status: AC | PRN
  Administered 2021-09-30 (×2): 12.5 g via INTRAVENOUS
  Filled 2021-09-30: qty 250

## 2021-09-30 MED ORDER — CHLORHEXIDINE GLUCONATE CLOTH 2 % EX PADS
6.0000 | MEDICATED_PAD | Freq: Every day | CUTANEOUS | Status: DC
Start: 2021-09-30 — End: 2021-10-03
  Administered 2021-09-30 – 2021-10-02 (×3): 6 via TOPICAL

## 2021-09-30 MED ORDER — HEMOSTATIC AGENTS (NO CHARGE) OPTIME
TOPICAL | Status: DC | PRN
  Administered 2021-09-30: 1 via TOPICAL

## 2021-09-30 MED ORDER — HEPARIN SODIUM (PORCINE) 1000 UNIT/ML IJ SOLN
INTRAMUSCULAR | Status: DC | PRN
  Administered 2021-09-30: 21000 [IU] via INTRAVENOUS
  Administered 2021-09-30: 2000 [IU] via INTRAVENOUS

## 2021-09-30 MED ORDER — LIDOCAINE 2% (20 MG/ML) 5 ML SYRINGE
INTRAMUSCULAR | Status: AC
  Filled 2021-09-30: qty 5

## 2021-09-30 MED ORDER — NITROGLYCERIN 0.2 MG/ML ON CALL CATH LAB
INTRAVENOUS | Status: DC | PRN
  Administered 2021-09-30: 20 ug via INTRAVENOUS

## 2021-09-30 MED ORDER — ORAL CARE MOUTH RINSE
15.0000 mL | OROMUCOSAL | Status: DC
  Administered 2021-09-30: 15 mL via OROMUCOSAL

## 2021-09-30 MED ORDER — SODIUM CHLORIDE (PF) 0.9 % IJ SOLN: OROMUCOSAL | Status: DC | PRN

## 2021-09-30 MED ORDER — PROPOFOL 10 MG/ML IV BOLUS
INTRAVENOUS | Status: DC | PRN
  Administered 2021-09-30: 10 mg via INTRAVENOUS
  Administered 2021-09-30: 50 mg via INTRAVENOUS

## 2021-09-30 MED ORDER — AMIODARONE HCL IN DEXTROSE 360-4.14 MG/200ML-% IV SOLN
30.0000 mg/h | INTRAVENOUS | Status: DC
Start: 2021-09-30 — End: 2021-10-01

## 2021-09-30 MED ORDER — CEFAZOLIN SODIUM-DEXTROSE 2-4 GM/100ML-% IV SOLN
2.0000 g | Freq: Three times a day (TID) | INTRAVENOUS | Status: AC
  Administered 2021-09-30 – 2021-10-02 (×6): 2 g via INTRAVENOUS
  Filled 2021-09-30 (×6): qty 100

## 2021-09-30 MED ORDER — VASOPRESSIN 20 UNIT/ML IV SOLN
INTRAVENOUS | Status: DC | PRN
  Administered 2021-09-30: 2 [IU] via INTRAVENOUS

## 2021-09-30 MED ORDER — METOPROLOL TARTRATE 12.5 MG HALF TABLET
12.5000 mg | ORAL_TABLET | Freq: Two times a day (BID) | ORAL | Status: DC
  Administered 2021-10-01 – 2021-10-05 (×9): 12.5 mg via ORAL
  Filled 2021-09-30 (×9): qty 1

## 2021-09-30 MED ORDER — LIDOCAINE 2% (20 MG/ML) 5 ML SYRINGE
INTRAMUSCULAR | Status: DC | PRN
  Administered 2021-09-30: 80 mg via INTRAVENOUS

## 2021-09-30 MED ORDER — ACETAMINOPHEN 650 MG RE SUPP
650.0000 mg | Freq: Once | RECTAL | Status: AC
  Administered 2021-09-30: 650 mg via RECTAL

## 2021-09-30 MED ORDER — VASOPRESSIN 20 UNIT/ML IV SOLN
INTRAVENOUS | Status: AC
  Filled 2021-09-30: qty 1

## 2021-09-30 MED ORDER — ARTIFICIAL TEARS OPHTHALMIC OINT
TOPICAL_OINTMENT | OPHTHALMIC | Status: AC
  Filled 2021-09-30: qty 3.5

## 2021-09-30 MED ORDER — LACTATED RINGERS IV SOLN: INTRAVENOUS | Status: DC

## 2021-09-30 MED ORDER — EPHEDRINE 5 MG/ML INJ
INTRAVENOUS | Status: AC
  Filled 2021-09-30: qty 5

## 2021-09-30 MED ORDER — ORAL CARE MOUTH RINSE: 15.0000 mL | OROMUCOSAL | Status: DC

## 2021-09-30 MED ORDER — FENTANYL CITRATE (PF) 250 MCG/5ML IJ SOLN
INTRAMUSCULAR | Status: DC | PRN
  Administered 2021-09-30 (×2): 50 ug via INTRAVENOUS
  Administered 2021-09-30: 100 ug via INTRAVENOUS
  Administered 2021-09-30: 50 ug via INTRAVENOUS
  Administered 2021-09-30 (×3): 100 ug via INTRAVENOUS
  Administered 2021-09-30: 200 ug via INTRAVENOUS
  Administered 2021-09-30: 150 ug via INTRAVENOUS
  Administered 2021-09-30: 50 ug via INTRAVENOUS
  Administered 2021-09-30 (×2): 100 ug via INTRAVENOUS

## 2021-09-30 MED ORDER — ALBUMIN HUMAN 5 % IV SOLN
250.0000 mL | INTRAVENOUS | Status: DC | PRN
  Administered 2021-09-30 (×3): 12.5 g via INTRAVENOUS
  Filled 2021-09-30: qty 250

## 2021-09-30 MED ORDER — TRAMADOL HCL 50 MG PO TABS
50.0000 mg | ORAL_TABLET | ORAL | Status: DC | PRN
  Administered 2021-10-01: 100 mg via ORAL
  Administered 2021-10-01: 50 mg via ORAL
  Administered 2021-10-01: 100 mg via ORAL
  Administered 2021-10-03: 50 mg via ORAL
  Filled 2021-09-30 (×2): qty 1
  Filled 2021-09-30 (×2): qty 2

## 2021-09-30 MED ORDER — PLASMA-LYTE A IV SOLN
INTRAVENOUS | Status: DC | PRN
  Administered 2021-09-30: 500 mL

## 2021-09-30 MED ORDER — 0.9 % SODIUM CHLORIDE (POUR BTL) OPTIME
TOPICAL | Status: DC | PRN
  Administered 2021-09-30: 3000 mL

## 2021-09-30 MED ORDER — SODIUM CHLORIDE 0.9 % IV SOLN: INTRAVENOUS | Status: DC | PRN

## 2021-09-30 MED ORDER — ROCURONIUM BROMIDE 10 MG/ML (PF) SYRINGE
PREFILLED_SYRINGE | INTRAVENOUS | Status: AC
  Filled 2021-09-30: qty 20

## 2021-09-30 MED ORDER — METOPROLOL TARTRATE 5 MG/5ML IV SOLN: 2.5000 mg | INTRAVENOUS | Status: DC | PRN

## 2021-09-30 MED ORDER — SODIUM CHLORIDE 0.9 % IV SOLN
250.0000 mL | INTRAVENOUS | Status: DC
Start: 2021-10-01 — End: 2021-10-01

## 2021-09-30 MED ORDER — PROPOFOL 10 MG/ML IV BOLUS
INTRAVENOUS | Status: AC
  Filled 2021-09-30: qty 20

## 2021-09-30 MED ORDER — FENTANYL CITRATE PF 50 MCG/ML IJ SOSY
50.0000 ug | PREFILLED_SYRINGE | INTRAMUSCULAR | Status: DC | PRN
  Administered 2021-09-30: 50 ug via INTRAVENOUS
  Filled 2021-09-30: qty 1

## 2021-09-30 MED ORDER — PHENYLEPHRINE 80 MCG/ML (10ML) SYRINGE FOR IV PUSH (FOR BLOOD PRESSURE SUPPORT)
PREFILLED_SYRINGE | INTRAVENOUS | Status: DC | PRN
  Administered 2021-09-30 (×2): 40 ug via INTRAVENOUS
  Administered 2021-09-30 (×3): 80 ug via INTRAVENOUS
  Administered 2021-09-30: 40 ug via INTRAVENOUS

## 2021-09-30 MED ORDER — SURGIFLO WITH THROMBIN (HEMOSTATIC MATRIX KIT) OPTIME
TOPICAL | Status: DC | PRN
  Administered 2021-09-30 (×2): 1 via TOPICAL

## 2021-09-30 MED ORDER — PANTOPRAZOLE SODIUM 40 MG PO TBEC
40.0000 mg | DELAYED_RELEASE_TABLET | Freq: Every day | ORAL | Status: DC
  Administered 2021-10-02 – 2021-10-05 (×4): 40 mg via ORAL
  Filled 2021-09-30 (×4): qty 1

## 2021-09-30 MED ORDER — ACETAMINOPHEN 500 MG PO TABS
1000.0000 mg | ORAL_TABLET | Freq: Four times a day (QID) | ORAL | Status: DC
  Administered 2021-09-30 – 2021-10-05 (×16): 1000 mg via ORAL
  Filled 2021-09-30 (×16): qty 2

## 2021-09-30 MED ORDER — DEXTROSE 50 % IV SOLN: 0.0000 mL | INTRAVENOUS | Status: DC | PRN

## 2021-09-30 MED ORDER — MIDAZOLAM HCL (PF) 5 MG/ML IJ SOLN
INTRAMUSCULAR | Status: DC | PRN
  Administered 2021-09-30: 1 mg via INTRAVENOUS
  Administered 2021-09-30 (×2): 2 mg via INTRAVENOUS
  Administered 2021-09-30: 1 mg via INTRAVENOUS
  Administered 2021-09-30 (×2): 2 mg via INTRAVENOUS

## 2021-09-30 MED ORDER — SODIUM CHLORIDE 0.9 % IV SOLN
20.0000 ug | Freq: Once | INTRAVENOUS | Status: AC
  Administered 2021-09-30: 20 ug via INTRAVENOUS
  Filled 2021-09-30: qty 5

## 2021-09-30 SURGICAL SUPPLY — 108 items
ADAPTER CARDIO PERF ANTE/RETRO (ADAPTER) ×3 IMPLANT
BAG DECANTER FOR FLEXI CONT (MISCELLANEOUS) ×3 IMPLANT
BLADE CLIPPER SURG (BLADE) ×3 IMPLANT
BLADE NDL 3 SS STRL (BLADE) IMPLANT
BLADE NEEDLE 3 SS STRL (BLADE) ×3 IMPLANT
BLADE STERNUM SYSTEM 6 (BLADE) ×3 IMPLANT
BLADE SURG 12 STRL SS (BLADE) ×3 IMPLANT
BNDG ELASTIC 4X5.8 VLCR STR LF (GAUZE/BANDAGES/DRESSINGS) ×3 IMPLANT
BNDG ELASTIC 6X5.8 VLCR STR LF (GAUZE/BANDAGES/DRESSINGS) ×3 IMPLANT
BNDG GAUZE DERMACEA FLUFF (GAUZE/BANDAGES/DRESSINGS) ×1
BNDG GAUZE DERMACEA FLUFF 4 (GAUZE/BANDAGES/DRESSINGS) IMPLANT
BNDG GAUZE ELAST 4 BULKY (GAUZE/BANDAGES/DRESSINGS) ×2 IMPLANT
CANISTER SUCT 3000ML PPV (MISCELLANEOUS) ×3 IMPLANT
CANNULA GUNDRY RCSP 15FR (MISCELLANEOUS) ×3 IMPLANT
CANNULA GUNDRY RETROGRADE 15FR (MISCELLANEOUS) ×1 IMPLANT
CATH CPB KIT VANTRIGT (MISCELLANEOUS) ×4 IMPLANT
CATH ROBINSON RED A/P 18FR (CATHETERS) ×9 IMPLANT
CATH THORACIC 28FR RT ANG (CATHETERS) ×3 IMPLANT
CLIP TI WIDE RED SMALL 24 (CLIP) ×1 IMPLANT
CONTAINER PROTECT SURGISLUSH (MISCELLANEOUS) ×6 IMPLANT
DERMABOND ADVANCED (GAUZE/BANDAGES/DRESSINGS) ×1
DERMABOND ADVANCED .7 DNX12 (GAUZE/BANDAGES/DRESSINGS) IMPLANT
DRAIN CHANNEL 32F RND 10.7 FF (WOUND CARE) ×3 IMPLANT
DRAPE CARDIOVASCULAR INCISE (DRAPES) ×3
DRAPE SLUSH/WARMER DISC (DRAPES) ×3 IMPLANT
DRAPE SRG 135X102X78XABS (DRAPES) ×2 IMPLANT
DRSG AQUACEL AG ADV 3.5X14 (GAUZE/BANDAGES/DRESSINGS) ×3 IMPLANT
DRSG COVADERM 4X10 (GAUZE/BANDAGES/DRESSINGS) ×1 IMPLANT
ELECT BLADE 4.0 EZ CLEAN MEGAD (MISCELLANEOUS) ×3
ELECT BLADE 6.5 EXT (BLADE) ×3 IMPLANT
ELECT CAUTERY BLADE 6.4 (BLADE) ×3 IMPLANT
ELECT REM PT RETURN 9FT ADLT (ELECTROSURGICAL) ×6
ELECTRODE BLDE 4.0 EZ CLN MEGD (MISCELLANEOUS) ×2 IMPLANT
ELECTRODE REM PT RTRN 9FT ADLT (ELECTROSURGICAL) ×4 IMPLANT
FELT TEFLON 1X6 (MISCELLANEOUS) ×6 IMPLANT
GAUZE 4X4 16PLY ~~LOC~~+RFID DBL (SPONGE) ×3 IMPLANT
GAUZE SPONGE 4X4 12PLY STRL (GAUZE/BANDAGES/DRESSINGS) ×6 IMPLANT
GLOVE BIO SURGEON STRL SZ 6 (GLOVE) ×8 IMPLANT
GLOVE BIO SURGEON STRL SZ7.5 (GLOVE) ×12 IMPLANT
GLOVE BIOGEL PI IND STRL 6 (GLOVE) IMPLANT
GLOVE BIOGEL PI IND STRL 6.5 (GLOVE) IMPLANT
GLOVE BIOGEL PI INDICATOR 6 (GLOVE) ×1
GLOVE BIOGEL PI INDICATOR 6.5 (GLOVE) ×6
GLOVE SS BIOGEL STRL SZ 7 (GLOVE) IMPLANT
GLOVE SUPERSENSE BIOGEL SZ 7 (GLOVE) ×1
GLOVE SURG SS PI 6.5 STRL IVOR (GLOVE) ×1 IMPLANT
GLOVE SURG SS PI 7.5 STRL IVOR (GLOVE) ×2 IMPLANT
GOWN STRL REUS W/ TWL LRG LVL3 (GOWN DISPOSABLE) ×8 IMPLANT
GOWN STRL REUS W/TWL LRG LVL3 (GOWN DISPOSABLE) ×12
HEMOSTAT POWDER SURGIFOAM 1G (HEMOSTASIS) ×9 IMPLANT
HEMOSTAT SURGICEL 2X14 (HEMOSTASIS) ×3 IMPLANT
INSERT FOGARTY XLG (MISCELLANEOUS) IMPLANT
KIT BASIN OR (CUSTOM PROCEDURE TRAY) ×3 IMPLANT
KIT SUCTION CATH 14FR (SUCTIONS) ×3 IMPLANT
KIT TURNOVER KIT B (KITS) ×3 IMPLANT
KIT VASOVIEW HEMOPRO 2 VH 4000 (KITS) ×3 IMPLANT
LEAD PACING MYOCARDI (MISCELLANEOUS) ×3 IMPLANT
MARKER GRAFT CORONARY BYPASS (MISCELLANEOUS) ×9 IMPLANT
NS IRRIG 1000ML POUR BTL (IV SOLUTION) ×15 IMPLANT
PACK E OPEN HEART (SUTURE) ×3 IMPLANT
PACK OPEN HEART (CUSTOM PROCEDURE TRAY) ×3 IMPLANT
PAD ARMBOARD 7.5X6 YLW CONV (MISCELLANEOUS) ×6 IMPLANT
PAD ELECT DEFIB RADIOL ZOLL (MISCELLANEOUS) ×3 IMPLANT
PENCIL BUTTON HOLSTER BLD 10FT (ELECTRODE) ×3 IMPLANT
POSITIONER HEAD DONUT 9IN (MISCELLANEOUS) ×3 IMPLANT
POWDER SURGICEL 3.0 GRAM (HEMOSTASIS) ×3 IMPLANT
PUNCH AORTIC ROTATE 4.0MM (MISCELLANEOUS) IMPLANT
PUNCH AORTIC ROTATE 4.5MM 8IN (MISCELLANEOUS) ×1 IMPLANT
PUNCH AORTIC ROTATE 5MM 8IN (MISCELLANEOUS) IMPLANT
SET MPS 3-ND DEL (MISCELLANEOUS) ×1 IMPLANT
SPONGE T-LAP 18X18 ~~LOC~~+RFID (SPONGE) ×14 IMPLANT
SPONGE T-LAP 4X18 ~~LOC~~+RFID (SPONGE) ×3 IMPLANT
SUPPORT HEART JANKE-BARRON (MISCELLANEOUS) ×3 IMPLANT
SURGIFLO W/THROMBIN 8M KIT (HEMOSTASIS) ×3 IMPLANT
SUT BONE WAX W31G (SUTURE) ×3 IMPLANT
SUT MNCRL AB 4-0 PS2 18 (SUTURE) ×1 IMPLANT
SUT PROLENE 3 0 SH DA (SUTURE) IMPLANT
SUT PROLENE 3 0 SH1 36 (SUTURE) IMPLANT
SUT PROLENE 4 0 RB 1 (SUTURE) ×9
SUT PROLENE 4 0 SH DA (SUTURE) ×4 IMPLANT
SUT PROLENE 4-0 RB1 .5 CRCL 36 (SUTURE) ×2 IMPLANT
SUT PROLENE 5 0 C 1 36 (SUTURE) IMPLANT
SUT PROLENE 6 0 C 1 30 (SUTURE) IMPLANT
SUT PROLENE 6 0 CC (SUTURE) ×12 IMPLANT
SUT PROLENE 7 0 BV1 MDA (SUTURE) IMPLANT
SUT PROLENE 8 0 BV175 6 (SUTURE) ×2 IMPLANT
SUT PROLENE BLUE 7 0 (SUTURE) ×4 IMPLANT
SUT SILK  1 MH (SUTURE)
SUT SILK 1 MH (SUTURE) IMPLANT
SUT SILK 2 0 SH CR/8 (SUTURE) ×1 IMPLANT
SUT SILK 3 0 SH CR/8 (SUTURE) IMPLANT
SUT STEEL 6MS V (SUTURE) ×6 IMPLANT
SUT STEEL SZ 6 DBL 3X14 BALL (SUTURE) ×3 IMPLANT
SUT VIC AB 1 CTX 36 (SUTURE) ×6
SUT VIC AB 1 CTX36XBRD ANBCTR (SUTURE) ×4 IMPLANT
SUT VIC AB 2-0 CT1 27 (SUTURE) ×3
SUT VIC AB 2-0 CT1 TAPERPNT 27 (SUTURE) IMPLANT
SUT VIC AB 2-0 CTX 27 (SUTURE) IMPLANT
SUT VIC AB 3-0 X1 27 (SUTURE) IMPLANT
SYSTEM SAHARA CHEST DRAIN ATS (WOUND CARE) ×3 IMPLANT
TAPE CLOTH SURG 6X10 WHT LF (GAUZE/BANDAGES/DRESSINGS) ×1 IMPLANT
TAPE PAPER 2X10 WHT MICROPORE (GAUZE/BANDAGES/DRESSINGS) ×1 IMPLANT
TOWEL GREEN STERILE (TOWEL DISPOSABLE) ×3 IMPLANT
TOWEL GREEN STERILE FF (TOWEL DISPOSABLE) ×3 IMPLANT
TRAY FOLEY SLVR 16FR TEMP STAT (SET/KITS/TRAYS/PACK) ×3 IMPLANT
TUBING LAP HI FLOW INSUFFLATIO (TUBING) ×3 IMPLANT
UNDERPAD 30X36 HEAVY ABSORB (UNDERPADS AND DIAPERS) ×3 IMPLANT
WATER STERILE IRR 1000ML POUR (IV SOLUTION) ×6 IMPLANT

## 2021-09-30 NOTE — Progress Notes (Signed)
ANTICOAGULATION CONSULT NOTE   Pharmacy Consult for Heparin Indication: chest pain/ACS  No Known Allergies  Patient Measurements: Height: '5\' 10"'$  (177.8 cm) Weight: 63.6 kg (140 lb 3.2 oz) IBW/kg (Calculated) : 73 kg Heparin Dosing Weight: 68 kg  Vital Signs: Temp: 97.9 F (36.6 C) (08/01 2129) Temp Source: Oral (08/01 2203) BP: 108/58 (08/01 2203) Pulse Rate: 70 (08/01 2203)  Labs: Recent Labs    09/27/21 1001 09/27/21 1204 09/27/21 1802 09/27/21 1824 09/27/21 2020 09/28/21 0235 09/29/21 0424 09/29/21 1227 09/30/21 0223  HGB 13.1  --   --   --   --   --  10.9*  --  9.5*  HCT 36.2*  --   --   --   --   --  30.1*  --  27.0*  PLT 104*  --   --   --   --   --  82*  --  72*  LABPROT 14.0  --   --   --   --   --   --   --   --   INR 1.1  --   --   --   --   --   --   --   --   HEPARINUNFRC  --   --   --    < >  --  0.39 0.23* 0.40 0.24*  CREATININE 0.82  --   --   --   --  0.82 0.87  --  0.80  TROPONINIHS 111* 243* 1,052*  --  1,592*  --   --   --   --    < > = values in this interval not displayed.    Estimated Creatinine Clearance: 77.3 mL/min (by C-G formula based on SCr of 0.8 mg/dL).  Medical History: Past Medical History:  Diagnosis Date   Hypertension    Medications:  Medications Prior to Admission  Medication Sig Dispense Refill Last Dose   lisinopril (ZESTRIL) 20 MG tablet Take 1 tablet by mouth daily.   09/26/2021   pantoprazole (PROTONIX) 40 MG tablet TAKE 1 TABLET BY MOUTH EVERY DAY (Patient taking differently: Take 40 mg by mouth daily.) 90 tablet 1 09/26/2021   triamcinolone cream (KENALOG) 0.1 % Apply 1 Application topically daily as needed (For rash).   Past Week   triamcinolone ointment (KENALOG) 0.1 % Apply 1 Application topically daily as needed (For rash).   Past Week   Scheduled:   amLODipine  5 mg Oral Daily   aspirin  81 mg Oral Daily   atorvastatin  80 mg Oral Daily   chlorhexidine  15 mL Mouth/Throat Once   Chlorhexidine Gluconate Cloth   6 each Topical Once   diazepam  5 mg Oral Once   epinephrine  0-10 mcg/min Intravenous To OR   heparin sodium (porcine) 2,500 Units, papaverine 30 mg in electrolyte-A (PLASMALYTE-A PH 7.4) 500 mL irrigation   Irrigation To OR   insulin   Intravenous To OR   magnesium sulfate  40 mEq Other To OR   metoprolol tartrate  12.5 mg Oral Once   metoprolol tartrate  25 mg Oral BID   pantoprazole  40 mg Oral Daily   phenylephrine  30-200 mcg/min Intravenous To OR   potassium chloride  80 mEq Other To OR   sodium chloride flush  3 mL Intravenous Q12H   tranexamic acid  15 mg/kg Intravenous To OR   tranexamic acid  2 mg/kg Intracatheter To OR   Infusions:  sodium chloride      ceFAZolin (ANCEF) IV      ceFAZolin (ANCEF) IV     dexmedetomidine     heparin 30,000 units/NS 1000 mL solution for CELLSAVER     heparin 1,000 Units/hr (09/30/21 0048)   milrinone     nitroGLYCERIN 15 mcg/min (09/29/21 2210)   nitroGLYCERIN     norepinephrine     tranexamic acid (CYKLOKAPRON) 2,500 mg in sodium chloride 0.9 % 250 mL (10 mg/mL) infusion     vancomycin     PRN: sodium chloride, acetaminophen, ALPRAZolam, morphine injection, nitroGLYCERIN, ondansetron (ZOFRAN) IV, sodium chloride flush, temazepam  Assessment: 34 yom with presenting with chest pain. Heparin per pharmacy consult placed for chest pain/ACS. Patient was not on anticoagulation prior to arrival. Patient had cardiac catheterization on 7/31 and planning for CABG procedure 8/2.   Heparin level of 0.24 is subtherapeutic on heparin 1000 units/hr. Per RN no issues with IV infusion or access. Hgb 9.5. Plts 72.    Goal of Therapy:  Heparin level 0.3-0.7 units/ml Monitor platelets by anticoagulation protocol: Yes   Plan:  Increase heparin to 1100 units/hr No repeat heparin level with CABG this AM will follow up after  Monitor for signs/symptoms of bleeding.   Cristela Felt, PharmD, BCPS Clinical Pharmacist 09/30/2021 3:32 AM

## 2021-09-30 NOTE — Transfer of Care (Signed)
Immediate Anesthesia Transfer of Care Note  Patient: Timothy [name]  Procedure(s) Performed: CORONARY ARTERY BYPASS GRAFTING (CABG) X 3 BYPASSES USING LEFT INTERNAL MAMMARY ARTERY AND RIGHT LEG ENDOHARVESTED SAPHENOUS VEIN GRAFT (Chest) TRANSESOPHAGEAL ECHOCARDIOGRAM (TEE) (Esophagus)  Patient Location: ICU  Anesthesia Type:General  Level of Consciousness: sedated and Patient remains intubated per anesthesia plan  Airway & Oxygen Therapy: Patient remains intubated per anesthesia plan and Patient placed on Ventilator (see vital sign flow sheet for setting)  Post-op Assessment: Report given to RN and Post -op Vital signs reviewed and stable  Post vital signs: Reviewed and stable  Last Vitals:  Vitals Value Taken Time  BP 111/50 09/30/21 1506  Temp 35.9 C 09/30/21 1513  Pulse 89 09/30/21 1513  Resp 14 09/30/21 1513  SpO2 100 % 09/30/21 1513  Vitals shown include unvalidated device data.  Last Pain:  Vitals:   09/30/21 0421  TempSrc: Oral  PainSc: 0-No pain      Patients Stated Pain Goal: 2 (02/22/48 7530)  Complications: No notable events documented.

## 2021-09-30 NOTE — Procedures (Signed)
Extubation Procedure Note  Patient Details:   Name: Timothy [name] [place] DOB: 11-Jul-1950 MRN: [id number]   Airway Documentation:    Vent end date: 09/30/21 Vent end time: 1810   Evaluation  O2 sats: stable throughout Complications: No apparent complications Patient did tolerate procedure well. Bilateral Breath Sounds: Clear   Yes  Pt extubated per heart weaning protocol. Placed on 3L [place]. NIF -22, VC 741m IS 400, performed w great effort. Positive cuff leak noted, no stridor heard. RT will continue to monitor.   MTobi Bastos8/04/2021, 6:20 PM

## 2021-09-30 NOTE — Anesthesia Procedure Notes (Signed)
Central Venous Catheter Insertion Performed by: Santa Lighter, MD, anesthesiologist Start/End8/04/2021 8:02 AM, 09/30/2021 8:12 AM Patient location: Pre-op. Preanesthetic checklist: patient identified, IV checked, site marked, risks and benefits discussed, surgical consent, monitors and equipment checked, pre-op evaluation and timeout performed Position: Trendelenburg Hand hygiene performed  and maximum sterile barriers used  Total catheter length 100. PA cath was placed.Swan type:thermodilution PA Cath depth:55 Procedure performed without using ultrasound guided technique. Attempts: 1 Patient tolerated the procedure well with no immediate complications.

## 2021-09-30 NOTE — Anesthesia Procedure Notes (Signed)
Central Venous Catheter Insertion Performed by: Santa Lighter, MD, anesthesiologist Start/End8/04/2021 7:52 AM, 09/30/2021 8:02 AM Patient location: Pre-op. Preanesthetic checklist: patient identified, IV checked, site marked, risks and benefits discussed, surgical consent, monitors and equipment checked, pre-op evaluation, timeout performed and anesthesia consent Position: Trendelenburg Lidocaine 1% used for infiltration and patient sedated Hand hygiene performed , maximum sterile barriers used  and Seldinger technique used Catheter size: 8.5 Fr Central line was placed.Sheath introducer Procedure performed using ultrasound guided technique. Ultrasound Notes:anatomy identified, needle tip was noted to be adjacent to the nerve/plexus identified, no ultrasound evidence of intravascular and/or intraneural injection and image(s) printed for medical record Attempts: 1 Following insertion, line sutured, dressing applied and Biopatch. Post procedure assessment: free fluid flow, blood return through all ports and no air  Patient tolerated the procedure well with no immediate complications.

## 2021-09-30 NOTE — Anesthesia Procedure Notes (Signed)
Arterial Line Insertion Start/End8/04/2021 7:50 AM Performed by: Carolan Clines, CRNA, CRNA  Patient location: Pre-op. Preanesthetic checklist: patient identified, IV checked, site marked, risks and benefits discussed, surgical consent, monitors and equipment checked, pre-op evaluation, timeout performed and anesthesia consent Lidocaine 1% used for infiltration Left, radial was placed Catheter size: 20 G Hand hygiene performed  and maximum sterile barriers used   Attempts: 1 Procedure performed using ultrasound guided technique. Following insertion, dressing applied and Biopatch. Post procedure assessment: normal and unchanged  Patient tolerated the procedure well with no immediate complications.

## 2021-09-30 NOTE — Progress Notes (Signed)
      [address]       [place] [address]             [phone]      S/p CABG x 3  Just extubated, Neuro intact  BP (!) 96/52   Pulse 89   Temp (!) 96.6 F (35.9 C) (Core)   Resp (!) 23   Ht '5\' 10"'$  (1.778 m)   Wt 63.6 kg   SpO2 100%   BMI 20.12 kg/m  22/14 CI 1.8 Norepi @ 2  Intake/Output Summary (Last 24 hours) at 09/30/2021 1813 Last data filed at 09/30/2021 1700 Gross per 24 hour  Intake 5224.82 ml  Output 2758 ml  Net 2466.82 ml   Minimal CT output Being transfused now for Hgb 8  Maleke Feria C. Roxan Hockey, MD [facility] [phone]

## 2021-09-30 NOTE — Brief Op Note (Signed)
09/27/2021 - 09/30/2021  12:42 PM  PATIENT:  Timothy [name]  71 y.o. male  PRE-OPERATIVE DIAGNOSIS:  CORONARY ARTERY DISEASE, NSTEMI  POST-OPERATIVE DIAGNOSIS:  CORONARY ARTERY DISEASE, NSTEMI  PROCEDURE:   CORONARY ARTERY BYPASS GRAFTING (CABG) x 3, USING LEFT INTERNAL MAMMARY ARTERY AND RIGHT LEG ENDOHARVESTED SAPHENOUS VEIN GRAFT   LIMA-LAD SVG-OM2 SVG-PDA  TRANSESOPHAGEAL ECHOCARDIOGRAM   Vein harvest time: 40mn Vein prep time: 223m  SURGEON:  VaDahlia ByesMD - Primary  PHYSICIAN ASSISTANT: StThomasenia SalesRoddenberry  ASSISTANTS: WaDineen KidRN, RN First Assistant   ANESTHESIA:   general  EBL: [id number] ADMINISTERED: PRBCs  DRAINS:  Mediastinal and left pleural drains    LOCAL MEDICATIONS USED:  NONE  SPECIMEN:  No Specimen  DISPOSITION OF SPECIMEN:  N/A  COUNTS:  YES  DICTATION: .Dragon Dictation  PLAN OF CARE: Admit to inpatient   PATIENT DISPOSITION:  ICU - intubated and hemodynamically stable.   Delay start of Pharmacological VTE agent (>24hrs) due to surgical blood loss or risk of bleeding: yes

## 2021-09-30 NOTE — Progress Notes (Signed)
Pre Procedure note for inpatients:   Timothy [name] has been scheduled for Procedure(s): CORONARY ARTERY BYPASS GRAFTING (CABG) (N/A) TRANSESOPHAGEAL ECHOCARDIOGRAM (TEE) (N/A) today. The various methods of treatment have been discussed with the patient. After consideration of the risks, benefits and treatment options the patient has consented to the planned procedure.   The patient has been seen and labs reviewed. There are no changes in the patient's condition to prevent proceeding with the planned procedure today.  Recent labs:  Lab Results  Component Value Date   WBC 5.3 09/30/2021   HGB 9.5 (L) 09/30/2021   HCT 27.0 (L) 09/30/2021   PLT 72 (L) 09/30/2021   GLUCOSE 116 (H) 09/30/2021   CHOL 146 09/28/2021   TRIG 132 09/28/2021   HDL 40 (L) 09/28/2021   LDLCALC 80 09/28/2021   ALT 57 (H) 09/29/2021   AST 71 (H) 09/29/2021   NA 132 (L) 09/30/2021   K 3.9 09/30/2021   CL 104 09/30/2021   CREATININE 0.80 09/30/2021   BUN 8 09/30/2021   CO2 22 09/30/2021   TSH 1.440 09/29/2021   INR 1.1 09/27/2021   HGBA1C 4.7 (L) 09/29/2021    Dahlia Byes, MD 09/30/2021 7:47 AM

## 2021-09-30 NOTE — Op Note (Signed)
NAME: Timothy [name], Timothy [name] MEDICAL RECORD NO: [id number] ACCOUNT NO: [id number] DATE OF BIRTH: [birth year] FACILITY: [facility] LOCATION: [facility] PHYSICIAN: Len Childs, MD  Operative Report   DATE OF PROCEDURE: 09/30/2021  OPERATION:   1.  Coronary artery bypass grafting x3 (left internal mammary artery to LAD, saphenous vein graft to posterior descending, saphenous vein graft to circumflex marginal).  2.  Endoscopic harvest of right leg greater saphenous vein.  PREOPERATIVE DIAGNOSES:  Non-ST elevation myocardial infarction, unstable angina, severe 3-vessel coronary artery disease.  POSTOPERATIVE DIAGNOSES:  Non-ST elevation myocardial infarction, unstable angina, severe 3-vessel coronary artery disease.  SURGEON:  Len Childs, MD  ASSISTANT:  Enid Cutter, PA-C  A surgical first assistant was needed due to the complexity of this operation and to meet the standard of surgical care for cardiac surgery at this institution.  The surgical first assistant was needed to harvest the vein conduit from the leg and close  the leg incision as well as to assist with the distal anastomoses with suture management, exposure, suctioning, and general assistance.  ANESTHESIA:  General.  DESCRIPTION OF PROCEDURE:  The patient was evaluated in preoperative holding where informed consent was documented and final issues were addressed with the patient in a face-to-face encounter.  After the A line and PA catheter were placed by the  anesthesia team, the patient was brought directly to the operating room and placed supine on the operating room table.  General anesthesia was induced and the patient remained stable.  A transesophageal echo probe was placed by the anesthesia team.  The  patient was then positioned after a Foley catheter was placed and prepped and draped as a sterile field.  A proper timeout was performed.  A sternal incision was made as the saphenous vein was harvested endoscopically  from the right leg.  The left  internal mammary artery was harvested as a pedicle graft from its origin at the subclavian vessel and was a good vessel with good flow.  The sternal retractor was placed and the pericardium was opened and suspended.  Pursestrings were placed in the  ascending aorta and right atrium.  After the vein was harvested, heparin was administered and the patient was cannulated and placed on cardiopulmonary bypass.  The coronaries were identified for grafting.  The right coronary was severely calcified and a  small 1-1.2 mm posterior descending was the best target.  The circumflex proximally also had heavy calcification, but the distal circumflex marginal was an adequate target.  The LAD had severe focal calcification at the mid vessel and beyond the focal  stenosis and plaque it was an adequate target.  Cardioplegia cannulas were placed for both antegrade and retrograde cold blood cardioplegia.  The mammary artery and vein grafts were prepared for the distal anastomoses and the patient was cooled to 32 degrees.  The aortic crossclamp was applied.  One  liter of cold blood cardioplegia was delivered in split doses between the antegrade aortic and retrograde coronary sinus catheters.  There was good cardioplegic arrest and supple temperature dropped less than 12 degrees.  Cardioplegia was delivered every  20 minutes.  The distal coronary anastomoses were performed.  The first distal anastomosis was to the posterior descending.  This was a 1.2 mm vessel.  A reverse saphenous vein was sewn end-to-side with running 7-0 Prolene with good flow through the graft.   Cardioplegia was redosed.  The second distal anastomosis was to the circumflex marginal.  This was a 1.5  mm vessel with proximal 80% stenosis.  A reverse saphenous vein was sewn end-to-side with running 7-0 Prolene with good flow through the graft.  Cardioplegia was redosed.  The third distal anastomosis was to the LAD distal  to the high-grade plaque of approximately 90% stenosis.  The left IMA pedicle was brought through an opening in the left lateral pericardium and was brought down onto the LAD and sewn end-to-side with  running 8-0 Prolene.  There was good flow through the anastomosis after briefly releasing the pedicle bulldog in the mammary artery.  The bulldog was reapplied and the pedicle was secured to epicardium.  Cardioplegia was redosed.  While the crossclamp was still in place, 2 proximal vein anastomoses were performed on the ascending aorta using a 4.5 mm punch and running 6-0 Prolene.  Prior to tying down the final proximal anastomosis, air was vented from the coronaries and the left  side of the heart using a dose of retrograde warm blood cardioplegia.  The crossclamp was removed.  The heart resumed a spontaneous rhythm.  The vein grafts were opened and de-aired, each had good flow and hemostasis was documented at the proximal and distal sites.  The cardioplegia cannulas were removed.  The patient was rewarmed and reperfused. The patient had diffuse coagulopathic type bleeding and the platelet count returned as 60,000.  The patient has had chronic anemia and chronic thrombocytopenia of an undiagnosed disorder.  When the patient was adequately reperfused and rewarmed, the lungs were expanded and the ventilator was resumed.  The patient was then weaned from cardiopulmonary bypass without difficulty with good LV systolic function by echo.  Protamine was  administered without adverse reaction.  The cannulas were removed.  This patient still had diffuse coagulopathy and required platelet transfusion for platelet count of 60,000, the patient required cryoprecipitate infusion for a low fibrinogen and the  patient required FFP for general coagulopathy.  After the products were administered, the patient's coagulopathy improved.  Chest tubes were placed in both pleural spaces and the anterior mediastinum and brought  out through separate incisions.  The  superior pericardial fat was closed over the aorta.  Sternal wires were placed.  The sternum was closed and the patient remained stable.  The pectoralis fascia was closed with a running #1 Vicryl.  The subcutaneous and skin layers were closed with a  running Vicryl and sterile dressings were applied.  Total cardiopulmonary bypass time was 130 minutes.  The patient returned to the ICU in stable condition.   VAI D: 09/30/2021 6:07:34 pm T: 09/30/2021 10:14:00 pm  JOB: [id number]/ [id number]

## 2021-09-30 NOTE — Anesthesia Procedure Notes (Signed)
Procedure Name: Intubation Date/Time: 09/30/2021 8:42 AM  Performed by: Carolan Clines, CRNAPre-anesthesia Checklist: Patient identified, Emergency Drugs available, Suction available and Patient being monitored Patient Re-evaluated:Patient Re-evaluated prior to induction Oxygen Delivery Method: Circle System Utilized Preoxygenation: Pre-oxygenation with 100% oxygen Induction Type: IV induction Ventilation: Mask ventilation without difficulty and Oral airway inserted - appropriate to patient size Laryngoscope Size: Mac and 3 Grade View: Grade II Tube type: Oral Tube size: 8.0 mm Number of attempts: 1 Airway Equipment and Method: Stylet and Oral airway Placement Confirmation: ETT inserted through vocal cords under direct vision, positive ETCO2 and breath sounds checked- equal and bilateral Secured at: 22 cm Tube secured with: Tape Dental Injury: Teeth and Oropharynx as per pre-operative assessment  Comments: Grade IIb with Mac 3.

## 2021-09-30 NOTE — Hospital Course (Signed)
HPI: Timothy [name] is a 71 year old male seen for cardiothoracic surgical consultation for consideration of coronary artery surgical revascularization.  The patient presented to the emergency department on 09/27/2021.  He has known cardiac risk factors including hypertension and hyperlipidemia.  Patient reports approximately 1 month history of chest discomfort but admits it could be even more.  He describes the pain as a pressure in the middle of his chest that radiates to his left arm at times.  Symptoms sometimes present for over an hour and can occur a few times a day.  The symptoms have  worsened recently prompting emergency room visit to [place].  He initially was found to have no significant acute ischemic changes on EKG but initial troponins were elevated.  They subsequently rose significantly from 111 to 1592 ruling in for non-STEMI.  Additionally, he developed a more severe event of chest pain and EKG demonstrated some ST depressions.  Cardiology consultation was obtained.  He was felt to require transfer to [facility] for further management and diagnostic evaluation.  He was started on Nitropaste and subsequently a nitroglycerin drip.  He was also started on heparin.  He is noted to have a previous nuclear stress in 2021.  EF at that time was 69% the study was noted to be normal with low risk.  On today's date he was taken to the cardiac catheterization lab where he was found to have severe three-vessel coronary artery disease.  Left ventricular ejection fraction was 55 to 65% by visual estimate.  LVEDP was normal.  Vessels are felt to be severely calcified.  An echocardiogram has been ordered and results are pending.  He is noted to have some mild elevation in liver enzymes and total bilirubin.  Renal function is noted to be normal prior to catheterization.  LDL cholesterol is 80.  He has a mild anemia and thrombocytopenia. He does not have a known history of diabetes. Ultimately Dr. Darcey Nora  felt that coronary artery bypass grafting would be the best long-term treatment for this patient. The risks and benefits of coronary artery bypass grafting were discussed with the patient and the patient agreed to move forward with the surgery.  Hospital Course: [name] was taken to the operating room on 09/30/2021. He underwent CABG x 3.  He tolerated the procedure well was taken to the surgical intensive care unit in stable condition.  Postoperative hospital course: Patient was extubated using standard post cardiac surgical protocols without difficulty.  He is remained hemodynamically stable but did require Levophed and Neo-Synephrine which is being weaned over time.  He has had moderate early chest tube drainage and they have been kept in place postoperative day #1.  He does have an expected acute blood loss anemia which is being monitored closely.  Hemoglobin and hematocrit on postop day 1 were 8/22 respectively.  He is being started on trinsicon.  He does have some expected postoperative volume overload and is being diuresed accordingly.

## 2021-10-01 ENCOUNTER — Encounter (HOSPITAL_COMMUNITY): Payer: Self-pay | Admitting: Cardiothoracic Surgery

## 2021-10-01 ENCOUNTER — Ambulatory Visit: Payer: Medicare Other | Admitting: Family Medicine

## 2021-10-01 ENCOUNTER — Inpatient Hospital Stay (HOSPITAL_COMMUNITY): Payer: Medicare Other

## 2021-10-01 LAB — PREPARE PLATELET PHERESIS
Unit division: 0
Unit division: 0

## 2021-10-01 LAB — BPAM PLATELET PHERESIS
Blood Product Expiration Date: 202308042359
Blood Product Expiration Date: 202308032359
ISSUE DATE / TIME: 202308021229
ISSUE DATE / TIME: 202308021309
Unit Type and Rh: 6200
Unit Type and Rh: 6200

## 2021-10-01 LAB — BPAM FFP
Blood Product Expiration Date: 202308062359
Blood Product Expiration Date: 202308062359
ISSUE DATE / TIME: 202308021230
ISSUE DATE / TIME: 202308021230
Unit Type and Rh: 6200
Unit Type and Rh: 6200

## 2021-10-01 LAB — PREPARE CRYOPRECIPITATE
Unit division: 0
Unit division: 0

## 2021-10-01 LAB — GLUCOSE, CAPILLARY
Glucose-Capillary: 106 mg/dL — ABNORMAL HIGH (ref 70–99)
Glucose-Capillary: 122 mg/dL — ABNORMAL HIGH (ref 70–99)
Glucose-Capillary: 123 mg/dL — ABNORMAL HIGH (ref 70–99)
Glucose-Capillary: 126 mg/dL — ABNORMAL HIGH (ref 70–99)
Glucose-Capillary: 131 mg/dL — ABNORMAL HIGH (ref 70–99)
Glucose-Capillary: 134 mg/dL — ABNORMAL HIGH (ref 70–99)
Glucose-Capillary: 135 mg/dL — ABNORMAL HIGH (ref 70–99)
Glucose-Capillary: 136 mg/dL — ABNORMAL HIGH (ref 70–99)
Glucose-Capillary: 141 mg/dL — ABNORMAL HIGH (ref 70–99)
Glucose-Capillary: 145 mg/dL — ABNORMAL HIGH (ref 70–99)
Glucose-Capillary: 145 mg/dL — ABNORMAL HIGH (ref 70–99)
Glucose-Capillary: 147 mg/dL — ABNORMAL HIGH (ref 70–99)
Glucose-Capillary: 148 mg/dL — ABNORMAL HIGH (ref 70–99)
Glucose-Capillary: 155 mg/dL — ABNORMAL HIGH (ref 70–99)
Glucose-Capillary: 170 mg/dL — ABNORMAL HIGH (ref 70–99)

## 2021-10-01 LAB — PREPARE FRESH FROZEN PLASMA

## 2021-10-01 LAB — CBC
HCT: 22.7 % — ABNORMAL LOW (ref 39.0–52.0)
HCT: 21.6 % — ABNORMAL LOW (ref 39.0–52.0)
Hemoglobin: 8 g/dL — ABNORMAL LOW (ref 13.0–17.0)
Hemoglobin: 7.5 g/dL — ABNORMAL LOW (ref 13.0–17.0)
MCH: 34.5 pg — ABNORMAL HIGH (ref 26.0–34.0)
MCH: 34.4 pg — ABNORMAL HIGH (ref 26.0–34.0)
MCHC: 35.2 g/dL (ref 30.0–36.0)
MCHC: 34.7 g/dL (ref 30.0–36.0)
MCV: 97.8 fL (ref 80.0–100.0)
MCV: 99.1 fL (ref 80.0–100.0)
Platelets: 62 10*3/uL — ABNORMAL LOW (ref 150–400)
Platelets: 45 10*3/uL — ABNORMAL LOW (ref 150–400)
RBC: 2.32 MIL/uL — ABNORMAL LOW (ref 4.22–5.81)
RBC: 2.18 MIL/uL — ABNORMAL LOW (ref 4.22–5.81)
RDW: 14.9 % (ref 11.5–15.5)
RDW: 15 % (ref 11.5–15.5)
WBC: 5.7 10*3/uL (ref 4.0–10.5)
WBC: 6.4 10*3/uL (ref 4.0–10.5)
nRBC: 0 % (ref 0.0–0.2)
nRBC: 0 % (ref 0.0–0.2)

## 2021-10-01 LAB — BASIC METABOLIC PANEL
Anion gap: 5 (ref 5–15)
Anion gap: 5 (ref 5–15)
BUN: 7 mg/dL — ABNORMAL LOW (ref 8–23)
BUN: 7 mg/dL — ABNORMAL LOW (ref 8–23)
CO2: 22 mmol/L (ref 22–32)
CO2: 24 mmol/L (ref 22–32)
Calcium: 8.1 mg/dL — ABNORMAL LOW (ref 8.9–10.3)
Calcium: 8.4 mg/dL — ABNORMAL LOW (ref 8.9–10.3)
Chloride: 109 mmol/L (ref 98–111)
Chloride: 104 mmol/L (ref 98–111)
Creatinine, Ser: 0.81 mg/dL (ref 0.61–1.24)
Creatinine, Ser: 0.82 mg/dL (ref 0.61–1.24)
GFR, Estimated: >60 mL/min (ref >60)
GFR, Estimated: >60 mL/min (ref >60)
Glucose, Bld: 119 mg/dL — ABNORMAL HIGH (ref 70–99)
Glucose, Bld: 152 mg/dL — ABNORMAL HIGH (ref 70–99)
Potassium: 4.2 mmol/L (ref 3.5–5.1)
Potassium: 3.9 mmol/L (ref 3.5–5.1)
Sodium: 136 mmol/L (ref 135–145)
Sodium: 133 mmol/L — ABNORMAL LOW (ref 135–145)

## 2021-10-01 LAB — BPAM CRYOPRECIPITATE
Blood Product Expiration Date: 202308021913
Blood Product Expiration Date: 202308021951
ISSUE DATE / TIME: 202308021340
ISSUE DATE / TIME: 202308021410
Unit Type and Rh: 5100
Unit Type and Rh: 5100

## 2021-10-01 LAB — ECHO INTRAOPERATIVE TEE
Height: 70 in
Weight: 2243.2 oz

## 2021-10-01 LAB — MAGNESIUM
Magnesium: 2.2 mg/dL (ref 1.7–2.4)
Magnesium: 2.1 mg/dL (ref 1.7–2.4)

## 2021-10-01 LAB — PREPARE RBC (CROSSMATCH)

## 2021-10-01 MED ORDER — FUROSEMIDE 10 MG/ML IJ SOLN
20.0000 mg | Freq: Two times a day (BID) | INTRAMUSCULAR | Status: DC
  Administered 2021-10-01 – 2021-10-03 (×4): 20 mg via INTRAVENOUS
  Filled 2021-10-01 (×4): qty 2

## 2021-10-01 MED ORDER — INSULIN ASPART 100 UNIT/ML IJ SOLN
0.0000 [IU] | INTRAMUSCULAR | Status: DC
  Administered 2021-10-01 – 2021-10-02 (×5): 2 [IU] via SUBCUTANEOUS

## 2021-10-01 MED ORDER — INSULIN ASPART 100 UNIT/ML IJ SOLN: 0.0000 [IU] | INTRAMUSCULAR | Status: DC

## 2021-10-01 MED ORDER — FENTANYL CITRATE PF 50 MCG/ML IJ SOSY
50.0000 ug | PREFILLED_SYRINGE | INTRAMUSCULAR | Status: DC | PRN
  Administered 2021-10-01 (×2): 50 ug via INTRAVENOUS
  Filled 2021-10-01 (×2): qty 1

## 2021-10-01 MED ORDER — MIDAZOLAM HCL 2 MG/2ML IJ SOLN: 1.0000 mg | INTRAMUSCULAR | Status: DC | PRN

## 2021-10-01 MED ORDER — FE FUMARATE-B12-VIT C-FA-IFC PO CAPS
1.0000 | ORAL_CAPSULE | Freq: Two times a day (BID) | ORAL | Status: DC
  Administered 2021-10-01 – 2021-10-05 (×8): 1 via ORAL
  Filled 2021-10-01 (×8): qty 1

## 2021-10-01 NOTE — Anesthesia Postprocedure Evaluation (Signed)
Anesthesia Post Note  Patient: Timothy [name]  Procedure(s) Performed: CORONARY ARTERY BYPASS GRAFTING (CABG) X 3 BYPASSES USING LEFT INTERNAL MAMMARY ARTERY AND RIGHT LEG ENDOHARVESTED SAPHENOUS VEIN GRAFT (Chest) TRANSESOPHAGEAL ECHOCARDIOGRAM (TEE) (Esophagus)     Patient location during evaluation: SICU Anesthesia Type: General Level of consciousness: sedated Pain management: pain level controlled Vital Signs Assessment: post-procedure vital signs reviewed and stable Respiratory status: patient remains intubated per anesthesia plan Cardiovascular status: stable (phenylephrine infusion) Postop Assessment: no apparent nausea or vomiting Anesthetic complications: no   No notable events documented.  Last Vitals:  Vitals:   10/01/21 1100 10/01/21 1152  BP: (!) 106/57   Pulse: 89   Resp: 17   Temp:  37.1 C  SpO2: 95%     Last Pain:  Vitals:   10/01/21 1344  TempSrc:   PainSc: [place]

## 2021-10-01 NOTE — Progress Notes (Addendum)
Progress Note  Patient Name: Timothy [name] Date of Encounter: 10/01/2021  [facility] Cardiologist: None   Subjective   POD1 s/p LIMA to LAD, VG to PDA, VG to Lcx  Insulin/phenly/norepi; PA 20/9, CO 5.4, CI 3; I/O +2.6  Hgb down to 6.9  Has sternal pain but otherwise feels well.   Inpatient Medications    Scheduled Meds:  sodium chloride   Intravenous Once   sodium chloride   Intravenous Once   acetaminophen  1,000 mg Oral Q6H   Or   acetaminophen (TYLENOL) oral liquid 160 mg/5 mL  1,000 mg Per Tube Q6H   aspirin EC  81 mg Oral Daily   atorvastatin  80 mg Oral Daily   bisacodyl  10 mg Oral Daily   Or   bisacodyl  10 mg Rectal Daily   Chlorhexidine Gluconate Cloth  6 each Topical Daily   docusate sodium  200 mg Oral Daily   insulin aspart  0-24 Units Subcutaneous Q4H   metoprolol tartrate  12.5 mg Oral BID   Or   metoprolol tartrate  12.5 mg Per Tube BID   [START ON 10/02/2021] pantoprazole  40 mg Oral Daily   sodium chloride flush  3 mL Intravenous Q12H   Continuous Infusions:  sodium chloride 20 mL/hr at 10/01/21 0700   sodium chloride     sodium chloride     albumin human Stopped (09/30/21 2031)    ceFAZolin (ANCEF) IV Stopped (10/01/21 2694)   insulin 1.1 Units/hr (10/01/21 0700)   lactated ringers     lactated ringers 999 mL/hr at 09/30/21 1830   lactated ringers 20 mL/hr at 10/01/21 0700   nitroGLYCERIN 0 mcg/min (09/30/21 1505)   norepinephrine (LEVOPHED) Adult infusion 3 mcg/min (10/01/21 0700)   phenylephrine (NEO-SYNEPHRINE) Adult infusion 17 mcg/min (10/01/21 0700)   PRN Meds: sodium chloride, albumin human, dextrose, fentaNYL (SUBLIMAZE) injection, lactated ringers, metoprolol tartrate, midazolam, ondansetron (ZOFRAN) IV, mouth rinse, oxyCODONE, sodium chloride flush, traMADol   Vital Signs    Vitals:   10/01/21 0625 10/01/21 0630 10/01/21 0645 10/01/21 0700  BP:  116/62 (!) 115/58 (!) 100/48  Pulse: 89 89 89 89  Resp: (!) '22 18 19 20   '$ Temp: 98.4 F (36.9 C) 98.4 F (36.9 C) 98.4 F (36.9 C) 98.2 F (36.8 C)  TempSrc:      SpO2: 94% 95% 96% 97%  Weight:      Height:        Intake/Output Summary (Last 24 hours) at 10/01/2021 0816 Last data filed at 10/01/2021 0700 Gross per 24 hour  Intake 7709.9 ml  Output 5028 ml  Net 2681.9 ml      10/01/2021    5:00 AM 09/27/2021    2:12 PM 09/27/2021    9:58 AM  Last 3 Weights  Weight (lbs) 151 lb 7.3 oz 140 lb 3.2 oz 150 lb  Weight (kg) 68.7 kg 63.594 kg 68.04 kg      Telemetry    A-paced - Personally Reviewed  ECG    None performed  Physical Exam   GEN: No acute distress.   Neck: RIJ swan Cardiac: RRR, no murmurs, rubs, or gallops.  Respiratory: Anterior fields clear GI: Soft, nontender, non-distended; sparse BS MS: No edema; No deformity. Neuro:  Nonfocal  Psych: Normal affect   Labs    High Sensitivity Troponin:   Recent Labs  Lab 09/27/21 1001 09/27/21 1204 09/27/21 1802 09/27/21 2020  TROPONINIHS 111* 243* 1,052* 1,592*  Chemistry Recent Labs  Lab 09/27/21 1001 09/28/21 0235 09/29/21 0424 09/30/21 0223 09/30/21 0853 09/30/21 1330 09/30/21 1334 09/30/21 1916 09/30/21 2030 10/01/21 0410  NA 131* 134* 135 132*   < > 138   < > 139 135 136  K 4.0 3.8 3.4* 3.9   < > 4.1   < > 4.0 4.2 4.2  CL 99 105 107 104   < > 103  --   --  107 109  CO2 22 20* 20* 22  --   --   --   --  20* 22  GLUCOSE 138* 130* 118* 116*   < > 108*  --   --  137* 119*  BUN 9 7* 8 8   < > 5*  --   --  8 7*  CREATININE 0.82 0.82 0.87 0.80   < > 0.50*  --   --  0.83 0.81  CALCIUM 9.6 9.1 9.1 8.8*  --   --   --   --  7.7* 8.1*  MG  --   --   --   --   --   --   --   --  2.7* 2.2  PROT 7.8 6.4* 6.2*  --   --   --   --   --   --   --   ALBUMIN 4.4 3.7 3.6  --   --   --   --   --   --   --   AST 128* 89* 71*  --   --   --   --   --   --   --   ALT 84* 66* 57*  --   --   --   --   --   --   --   ALKPHOS 83 68 62  --   --   --   --   --   --   --   BILITOT 1.1 2.4*  2.3*  --   --   --   --   --   --   --   GFRNONAA >60 >60 >60 >60  --   --   --   --  >60 >60  ANIONGAP '10 9 8 6  '$ --   --   --   --  8 5   < > = values in this interval not displayed.    Lipids  Recent Labs  Lab 09/28/21 0235  CHOL 146  TRIG 132  HDL 40*  LDLCALC 80  CHOLHDL 3.7    Hematology Recent Labs  Lab 09/30/21 1524 09/30/21 1807 09/30/21 1916 09/30/21 2030 10/01/21 0410  WBC 5.1  --   --  6.0 5.7  RBC 2.26*  --   --  2.41* 2.32*  HGB 8.0*   < > 7.5* 8.2* 8.0*  HCT 22.4*   < > 22.0* 23.6* 22.7*  MCV 99.1  --   --  97.9 97.8  MCH 35.4*  --   --  34.0 34.5*  MCHC 35.7  --   --  34.7 35.2  RDW 13.8  --   --  14.6 14.9  PLT 60*  --   --  58* 62*   < > = values in this interval not displayed.   Thyroid  Recent Labs  Lab 09/29/21 0424  TSH 1.440    BNPNo results for input(s): "BNP", "PROBNP" in the last 168 hours.  DDimer  Recent Labs  Lab 09/27/21  1001  DDIMER 0.31     Radiology    DG Chest Port 1 View  Result Date: 09/30/2021 CLINICAL DATA:  Pneumothorax. EXAM: PORTABLE CHEST 1 VIEW COMPARISON:  Chest x-ray 09/27/2021 FINDINGS: Endotracheal tube tip is 6 cm above the carina. Bilateral chest tubes and mediastinal drain are present. There is a Swan-Ganz catheter with distal tip overlying the right main pulmonary artery. There are new sternotomy wires. The cardiomediastinal silhouette is within normal limits. There is minimal strandy opacities in the left lung base favored is atelectasis. There is no evidence for pleural effusion or pneumothorax. No acute fractures are seen. IMPRESSION: 1. Postsurgical changes.  No pneumothorax. 2. Left basilar atelectasis. 3. Endotracheal tube tip 6 cm above carina. 4. Swan-Ganz catheter tip projects over the right main pulmonary artery. Electronically Signed   By: Ronney Asters M.D.   On: 09/30/2021 15:04   VAS US DOPPLER PRE CABG  Result Date: 09/29/2021 PREOPERATIVE VASCULAR EVALUATION Patient Name:  Timothy [name]  Date of  Exam:   09/29/2021 Medical Rec #: [id number]       Accession #:    [id number] Date of Birth: 01-Jul-1950       Patient Gender: M Patient Age:   15 years Exam Location:  [facility] Procedure:      VAS US DOPPLER PRE CABG Referring Phys: Collier Salina VANTRIGT --------------------------------------------------------------------------------  Indications:      Pre-CABG. Risk Factors:     None. Comparison Study: No prior studies. Performing Technologist: Carlos Levering RVT  Examination Guidelines: A complete evaluation includes B-mode imaging, spectral Doppler, color Doppler, and power Doppler as needed of all accessible portions of each vessel. Bilateral testing is considered an integral part of a complete examination. Limited examinations for reoccurring indications may be performed as noted.  Right Carotid Findings: +----------+--------+-------+--------+--------------------------------+--------+           PSV cm/sEDV    StenosisDescribe                        Comments                   cm/s                                                    +----------+--------+-------+--------+--------------------------------+--------+ CCA Prox  97      14             smooth, heterogenous and                                                  calcific                                 +----------+--------+-------+--------+--------------------------------+--------+ CCA Distal109     17             smooth and heterogenous                  +----------+--------+-------+--------+--------------------------------+--------+ ICA Prox  80      19             calcific                                 +----------+--------+-------+--------+--------------------------------+--------+  ICA Distal62      22                                             tortuous +----------+--------+-------+--------+--------------------------------+--------+ ECA       129     11                                                       +----------+--------+-------+--------+--------------------------------+--------+ +----------+--------+-------+--------+------------+           PSV cm/sEDV cmsDescribeArm Pressure +----------+--------+-------+--------+------------+ Subclavian87                                  +----------+--------+-------+--------+------------+ +---------+--------+--+--------+--+---------+ VertebralPSV cm/s33EDV cm/s10Antegrade +---------+--------+--+--------+--+---------+ Left Carotid Findings: +----------+-------+-------+--------+---------------------------------+--------+           PSV    EDV    StenosisDescribe                         Comments           cm/s   cm/s                                                     +----------+-------+-------+--------+---------------------------------+--------+ CCA Prox  76     11             irregular and heterogenous                +----------+-------+-------+--------+---------------------------------+--------+ CCA Distal70     10             smooth and heterogenous                   +----------+-------+-------+--------+---------------------------------+--------+ ICA Prox  65     22             calcific, irregular and                                                   heterogenous                              +----------+-------+-------+--------+---------------------------------+--------+ ICA Distal47     18                                              tortuous +----------+-------+-------+--------+---------------------------------+--------+ ECA       94     7                                                        +----------+-------+-------+--------+---------------------------------+--------+ +----------+--------+--------+--------+------------+  SubclavianPSV cm/sEDV cm/sDescribeArm Pressure +----------+--------+--------+--------+------------+           129                                   +----------+--------+--------+--------+------------+ +---------+--------+--+--------+--+---------+ VertebralPSV cm/s35EDV cm/s11Antegrade +---------+--------+--+--------+--+---------+  ABI Findings: +--------+------------------+-----+-----------+--------+ Right   Rt Pressure (mmHg)IndexWaveform   Comment  +--------+------------------+-----+-----------+--------+ [id number]                    triphasic           +--------+------------------+-----+-----------+--------+ PTA     148               1.29 triphasic           +--------+------------------+-----+-----------+--------+ DP      139               1.21 multiphasic         +--------+------------------+-----+-----------+--------+ +--------+------------------+-----+---------+-------+ Left    Lt Pressure (mmHg)IndexWaveform Comment +--------+------------------+-----+---------+-------+ [id number]                    triphasic        +--------+------------------+-----+---------+-------+ PTA     131               1.14 triphasic        +--------+------------------+-----+---------+-------+ DP      141               1.23 triphasic        +--------+------------------+-----+---------+-------+  Right Doppler Findings: +--------+--------+-----+---------+--------+ Site    PressureIndexDoppler  Comments +--------+--------+-----+---------+--------+ [id number]          triphasic         +--------+--------+-----+---------+--------+ Radial               triphasic         +--------+--------+-----+---------+--------+ Ulnar                triphasic         +--------+--------+-----+---------+--------+  Left Doppler Findings: +--------+--------+-----+---------+--------+ Site    PressureIndexDoppler  Comments +--------+--------+-----+---------+--------+ [id number]          triphasic         +--------+--------+-----+---------+--------+ Radial               triphasic          +--------+--------+-----+---------+--------+ Ulnar                triphasic         +--------+--------+-----+---------+--------+  Summary: Right Carotid: Velocities in the right ICA are consistent with a 1-39% stenosis. Left Carotid: Velocities in the left ICA are consistent with a 1-39% stenosis. Vertebrals: Bilateral vertebral arteries demonstrate antegrade flow. Right ABI: Resting right ankle-brachial index is within normal range. No evidence of significant right lower extremity arterial disease. Left ABI: Resting left ankle-brachial index is within normal range. No evidence of significant left lower extremity arterial disease. Right Upper Extremity: Doppler waveform obliterate with right radial compression. Doppler waveforms decrease >50% with right ulnar compression. Left Upper Extremity: Doppler waveform obliterate with left radial compression. Doppler waveform obliterate with left ulnar compression.  Electronically signed by Deitra Mayo MD on 09/29/2021 at 4:13:59 PM.    Final     Cardiac Studies   Echo 7/31 EF 60-65% without significant valvular issues  Cath 7/31 Prox RCA lesion is 99% stenosed.   Mid RCA to Dist RCA lesion is 99% stenosed.   Prox  Cx to Mid Cx lesion is 70% stenosed.   Prox LAD to Mid LAD lesion is 75% stenosed.   Prox LAD lesion is 75% stenosed.   Mid LAD lesion is 99% stenosed.   The left ventricular systolic function is normal.   LV end diastolic pressure is normal.   The left ventricular ejection fraction is 55-65% by visual estimate    Patient Profile     71 y.o. 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  Cont ASA, atorvastatin; BB and plavix when able.  Remains on pressors with good CO/CI. HL:  Cont high dose statin. HTN:  Pressors weaning. Anemia:  Per CT surgery.  For questions or updates, please contact [place] Please consult [email or web address] for contact info  under        Signed, Early Osmond, MD  10/01/2021, 8:16 AM

## 2021-10-01 NOTE — Progress Notes (Signed)
CT surgery PM rounds  Patient getting stronger maintaining sinus rhythm Mobilizing out of bed, Neo weaned off A-line out P.m. labs show decreased platelet count 60k>>45k And hemoglobin 8.0>> 7.5 Chest tube output minimal and chest tubes removed We will transfuse 1 platelet and 1 packed cell with Lasix  Blood pressure 98/61, pulse 89, temperature 98.2 F (36.8 C), temperature source Oral, resp. rate 10, height '5\' 10"'$  (1.778 m), weight 68.7 kg, SpO2 93 %.

## 2021-10-01 NOTE — Progress Notes (Addendum)
[facility] DAILY ICU PROGRESS NOTE                   [address]            [place] [address]          [phone]   1 Day Post-Op Procedure(s) (LRB): CORONARY ARTERY BYPASS GRAFTING (CABG) X 3 BYPASSES USING LEFT INTERNAL MAMMARY ARTERY AND RIGHT LEG ENDOHARVESTED SAPHENOUS VEIN GRAFT (N/A) TRANSESOPHAGEAL ECHOCARDIOGRAM (TEE) (N/A)  Total Length of Stay:  LOS: 4 days   Subjective: In good spirits  Objective: Vital signs in last 24 hours: Temp:  [96.4 F (35.8 C)-98.8 F (37.1 C)] 98.2 F (36.8 C) (08/03 0700) Pulse Rate:  [57-91] 89 (08/03 0700) Cardiac Rhythm: Normal sinus rhythm (08/02 1930) Resp:  [0-28] 20 (08/03 0700) BP: (65-134)/(30-87) 100/48 (08/03 0700) SpO2:  [93 %-100 %] 97 % (08/03 0700) Arterial Line BP: (67-154)/(42-79) 114/45 (08/03 0700) FiO2 (%):  [40 %-50 %] 40 % (08/02 1725) Weight:  [68.7 kg] 68.7 kg (08/03 0500)  Filed Weights   09/27/21 0958 09/27/21 1412 10/01/21 0500  Weight: 68 kg 63.6 kg 68.7 kg    Weight change:    Hemodynamic parameters for last 24 hours: PAP: (8-30)/(-6-18) 20/9 CO:  [3.3 L/min-5.4 L/min] 5.4 L/min CI:  [1.8 L/min/m2-3 L/min/m2] 3 L/min/m2  Intake/Output from previous day: 08/02 0701 - 08/03 0700 In: 7709.9 [P.O.:360; I.V.:3735.3; Blood:1878.3; IV Piggyback:1736.3] Out: 5028 [Urine:3100; Emesis/NG output:50; Blood:733; Chest GLOV:5643]  Intake/Output this shift: No intake/output data recorded.  Current Meds: Scheduled Meds:  sodium chloride   Intravenous Once   sodium chloride   Intravenous Once   acetaminophen  1,000 mg Oral Q6H   Or   acetaminophen (TYLENOL) oral liquid 160 mg/5 mL  1,000 mg Per Tube Q6H   aspirin EC  81 mg Oral Daily   atorvastatin  80 mg Oral Daily   bisacodyl  10 mg Oral Daily   Or   bisacodyl  10 mg Rectal Daily   Chlorhexidine Gluconate Cloth  6 each Topical Daily   docusate sodium  200 mg Oral Daily   metoprolol tartrate  12.5 mg Oral BID   Or   metoprolol  tartrate  12.5 mg Per Tube BID   [START ON 10/02/2021] pantoprazole  40 mg Oral Daily   sodium chloride flush  3 mL Intravenous Q12H   Continuous Infusions:  sodium chloride 20 mL/hr at 10/01/21 0700   sodium chloride     sodium chloride     albumin human Stopped (09/30/21 2031)   amiodarone      ceFAZolin (ANCEF) IV Stopped (10/01/21 3295)   insulin 1.1 Units/hr (10/01/21 0700)   lactated ringers     lactated ringers 999 mL/hr at 09/30/21 1830   lactated ringers 20 mL/hr at 10/01/21 0700   nitroGLYCERIN 0 mcg/min (09/30/21 1505)   norepinephrine (LEVOPHED) Adult infusion 3 mcg/min (10/01/21 0700)   phenylephrine (NEO-SYNEPHRINE) Adult infusion 17 mcg/min (10/01/21 0700)   PRN Meds:.sodium chloride, albumin human, dextrose, fentaNYL (SUBLIMAZE) injection, lactated ringers, metoprolol tartrate, midazolam, ondansetron (ZOFRAN) IV, mouth rinse, oxyCODONE, sodium chloride flush, traMADol  General appearance: alert, cooperative, and no distress Heart: regular rate and rhythm Lungs: mildly dim in bases Abdomen: benign Extremities: no significant edema Wound: dressings CDI  Lab Results: CBC: Recent Labs    09/30/21 2030 10/01/21 0410  WBC 6.0 5.7  HGB 8.2* 8.0*  HCT 23.6* 22.7*  PLT 58* 62*   BMET:  Recent Labs  09/30/21 2030 10/01/21 0410  NA 135 136  K 4.2 4.2  CL 107 109  CO2 20* 22  GLUCOSE 137* 119*  BUN 8 7*  CREATININE 0.83 0.81  CALCIUM 7.7* 8.1*    CMET: Lab Results  Component Value Date   WBC 5.7 10/01/2021   HGB 8.0 (L) 10/01/2021   HCT 22.7 (L) 10/01/2021   PLT 62 (L) 10/01/2021   GLUCOSE 119 (H) 10/01/2021   CHOL 146 09/28/2021   TRIG 132 09/28/2021   HDL 40 (L) 09/28/2021   LDLCALC 80 09/28/2021   ALT 57 (H) 09/29/2021   AST 71 (H) 09/29/2021   NA 136 10/01/2021   K 4.2 10/01/2021   CL 109 10/01/2021   CREATININE 0.81 10/01/2021   BUN 7 (L) 10/01/2021   CO2 22 10/01/2021   TSH 1.440 09/29/2021   INR 1.4 (H) 09/30/2021   HGBA1C 4.7 (L)  09/29/2021      PT/INR:  Recent Labs    09/30/21 1524  LABPROT 16.6*  INR 1.4*   Radiology: [facility] Chest Port 1 View  Result Date: 09/30/2021 CLINICAL DATA:  Pneumothorax. EXAM: PORTABLE CHEST 1 VIEW COMPARISON:  Chest x-ray 09/27/2021 FINDINGS: Endotracheal tube tip is 6 cm above the carina. Bilateral chest tubes and mediastinal drain are present. There is a Swan-Ganz catheter with distal tip overlying the right main pulmonary artery. There are new sternotomy wires. The cardiomediastinal silhouette is within normal limits. There is minimal strandy opacities in the left lung base favored is atelectasis. There is no evidence for pleural effusion or pneumothorax. No acute fractures are seen. IMPRESSION: 1. Postsurgical changes.  No pneumothorax. 2. Left basilar atelectasis. 3. Endotracheal tube tip 6 cm above carina. 4. Swan-Ganz catheter tip projects over the right main pulmonary artery. Electronically Signed   By: Ronney Asters M.D.   On: 09/30/2021 15:04     Assessment/Plan: S/P Procedure(s) (LRB): CORONARY ARTERY BYPASS GRAFTING (CABG) X 3 BYPASSES USING LEFT INTERNAL MAMMARY ARTERY AND RIGHT LEG ENDOHARVESTED SAPHENOUS VEIN GRAFT (N/A) TRANSESOPHAGEAL ECHOCARDIOGRAM (TEE) (N/A) POD#1  1 afeb, hemodyn stable, sinus rhythm, gtts levo and neo, - systolic mostly low 676'H- did get orthostatic with standing 2 sats good on 1 liter currently 3 excellent UOP, weight up about 5 kg- normal renal fxn 4 CT 733 recorded- keep in place for now 5 CXR- basilar atx 6 expected ABLA, H/H 8/22- close to transfusion thresholed- monitor closely 7 adeq conrol of CBG- not a diabetic- routine management for post- op CABG 8 pulm hygiene/rehab as able per routine protocols  Grove Giovanni PA-C Pager [phone] 10/01/2021 7:15 AM   Plt count stable with nadir at 58k Will hold heparin, progression orders in place  patient examined and medical record reviewed,agree with above note. Dahlia Byes 10/01/2021

## 2021-10-02 ENCOUNTER — Inpatient Hospital Stay (HOSPITAL_COMMUNITY): Payer: Medicare Other

## 2021-10-02 LAB — TYPE AND SCREEN
ABO/RH(D): O POS
Antibody Screen: NEGATIVE
Unit division: 0
Unit division: 0
Unit division: 0
Unit division: 0

## 2021-10-02 LAB — BPAM PLATELET PHERESIS
Blood Product Expiration Date: 202308062359
ISSUE DATE / TIME: 202308032011
Unit Type and Rh: 6200

## 2021-10-02 LAB — BPAM RBC
Blood Product Expiration Date: 202308292359
Blood Product Expiration Date: 202308292359
Blood Product Expiration Date: 202308292359
Blood Product Expiration Date: 202308292359
ISSUE DATE / TIME: 202308020929
ISSUE DATE / TIME: 202308021654
ISSUE DATE / TIME: 202308021712
ISSUE DATE / TIME: 202308032011
Unit Type and Rh: 5100
Unit Type and Rh: 5100
Unit Type and Rh: 5100
Unit Type and Rh: 5100

## 2021-10-02 LAB — PREPARE PLATELET PHERESIS: Unit division: 0

## 2021-10-02 LAB — GLUCOSE, CAPILLARY
Glucose-Capillary: 124 mg/dL — ABNORMAL HIGH (ref 70–99)
Glucose-Capillary: 124 mg/dL — ABNORMAL HIGH (ref 70–99)
Glucose-Capillary: 105 mg/dL — ABNORMAL HIGH (ref 70–99)
Glucose-Capillary: 98 mg/dL (ref 70–99)
Glucose-Capillary: 124 mg/dL — ABNORMAL HIGH (ref 70–99)
Glucose-Capillary: 103 mg/dL — ABNORMAL HIGH (ref 70–99)

## 2021-10-02 LAB — BASIC METABOLIC PANEL
Anion gap: 8 (ref 5–15)
BUN: 5 mg/dL — ABNORMAL LOW (ref 8–23)
CO2: 23 mmol/L (ref 22–32)
Calcium: 8.4 mg/dL — ABNORMAL LOW (ref 8.9–10.3)
Chloride: 99 mmol/L (ref 98–111)
Creatinine, Ser: 0.83 mg/dL (ref 0.61–1.24)
GFR, Estimated: >60 mL/min (ref >60)
Glucose, Bld: 155 mg/dL — ABNORMAL HIGH (ref 70–99)
Potassium: 3.8 mmol/L (ref 3.5–5.1)
Sodium: 130 mmol/L — ABNORMAL LOW (ref 135–145)

## 2021-10-02 LAB — CBC
HCT: 25.7 % — ABNORMAL LOW (ref 39.0–52.0)
Hemoglobin: 8.7 g/dL — ABNORMAL LOW (ref 13.0–17.0)
MCH: 33.6 pg (ref 26.0–34.0)
MCHC: 33.9 g/dL (ref 30.0–36.0)
MCV: 99.2 fL (ref 80.0–100.0)
Platelets: 62 10*3/uL — ABNORMAL LOW (ref 150–400)
RBC: 2.59 MIL/uL — ABNORMAL LOW (ref 4.22–5.81)
RDW: 14.6 % (ref 11.5–15.5)
WBC: 5.7 10*3/uL (ref 4.0–10.5)
nRBC: 0 % (ref 0.0–0.2)

## 2021-10-02 MED ORDER — SODIUM CHLORIDE 0.9% FLUSH: 3.0000 mL | INTRAVENOUS | Status: DC | PRN

## 2021-10-02 MED ORDER — SODIUM CHLORIDE 0.9% FLUSH
3.0000 mL | Freq: Two times a day (BID) | INTRAVENOUS | Status: DC
  Administered 2021-10-02 – 2021-10-05 (×6): 3 mL via INTRAVENOUS

## 2021-10-02 MED ORDER — INSULIN ASPART 100 UNIT/ML IJ SOLN
0.0000 [IU] | Freq: Three times a day (TID) | INTRAMUSCULAR | Status: DC
  Administered 2021-10-02 (×2): 2 [IU] via SUBCUTANEOUS

## 2021-10-02 MED ORDER — MAGNESIUM HYDROXIDE 400 MG/5ML PO SUSP: 30.0000 mL | Freq: Every day | ORAL | Status: DC | PRN

## 2021-10-02 MED ORDER — ~~LOC~~ CARDIAC SURGERY, PATIENT & FAMILY EDUCATION
Freq: Once | Status: AC
Start: 2021-10-02 — End: 2021-10-02
  Administered 2021-10-02: 1

## 2021-10-02 MED ORDER — SODIUM CHLORIDE 0.9 % IV SOLN: 250.0000 mL | INTRAVENOUS | Status: DC | PRN

## 2021-10-02 MED FILL — Magnesium Sulfate Inj 50%: INTRAMUSCULAR | Qty: 10 | Status: AC

## 2021-10-02 MED FILL — Sodium Bicarbonate IV Soln 8.4%: INTRAVENOUS | Qty: 50 | Status: AC

## 2021-10-02 MED FILL — Lidocaine HCl Local Soln Prefilled Syringe 100 MG/5ML (2%): INTRAMUSCULAR | Qty: 5 | Status: AC

## 2021-10-02 MED FILL — Sodium Chloride IV Soln 0.9%: INTRAVENOUS | Qty: 4000 | Status: AC

## 2021-10-02 MED FILL — Mannitol IV Soln 20%: INTRAVENOUS | Qty: 500 | Status: AC

## 2021-10-02 MED FILL — Potassium Chloride Inj 2 mEq/ML: INTRAVENOUS | Qty: 40 | Status: AC

## 2021-10-02 MED FILL — Heparin Sodium (Porcine) Inj 1000 Unit/ML: Qty: 1000 | Status: AC

## 2021-10-02 MED FILL — Heparin Sodium (Porcine) Inj 1000 Unit/ML: INTRAMUSCULAR | Qty: 10 | Status: AC

## 2021-10-02 MED FILL — Calcium Chloride Inj 10%: INTRAVENOUS | Qty: 10 | Status: AC

## 2021-10-02 MED FILL — Electrolyte-R (PH 7.4) Solution: INTRAVENOUS | Qty: 4000 | Status: AC

## 2021-10-02 NOTE — Discharge Summary (Incomplete)
[address]       [place] [address]             [phone]    Physician Discharge Summary  Patient ID: Timothy [name] MRN: [id number] DOB/AGE: 71/07/1949 71 y.o.  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   NSTEMI (non-ST elevated myocardial infarction) ([place]) 09/27/2021   Hyperlipidemia 06/24/2021   Aortic atherosclerosis ([place]) 06/24/2021   Pain of left hip joint 05/23/2021   Pseudophakia, both eyes 11/18/2020   Pancytopenia ([place]) 10/17/2020   Hyponatremia 10/17/2020   Hypertension    Chronic kidney disease    COVID-19 01/2020   Pain in joint of right knee 11/28/2019   Steatosis of liver 08/24/2019   Bilateral hearing loss 08/24/2019   Hypertriglyceridemia 08/15/2019   Follow-up examination after eye surgery 07/19/2019   Right epiretinal membrane 07/19/2019   Left epiretinal membrane 07/19/2019   Posterior vitreous detachment of left eye 07/19/2019   Atypical chest pain 07/13/2019   Subacromial impingement 01/16/2019   Elevated LFTs 09/02/2018   Need for shingles vaccine 09/02/2018   Healthcare maintenance 08/22/2018   Need for influenza vaccination 12/22/2017   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/09/2017   Essential hypertension 04/07/2016   COLONIC POLYPS, HX OF 02/12/2009   Dyslipidemia 11/21/2007   Osteoarthritis 01/17/2007   NEPHROLITHIASIS, HX OF 01/17/2007     Discharged Condition: good  HPI: Author Hatlestad is a 71 year old male seen for cardiothoracic surgical consultation for consideration of coronary artery surgical revascularization.  The patient presented to the emergency department on 09/27/2021.  He has  known cardiac risk factors including hypertension and hyperlipidemia.  Patient reports approximately 1 month history of chest discomfort but admits it could be even more.  He describes the pain as a pressure in the middle of his chest that radiates to his left arm at times.  Symptoms sometimes present for over an hour and can occur a few times a day.  The symptoms have  worsened recently prompting emergency room visit to [place].  He initially was found to have no significant acute ischemic changes on EKG but initial troponins were elevated.  They subsequently rose significantly from 111 to 1592 ruling in for non-STEMI.  Additionally, he developed a more severe event of chest pain and EKG demonstrated some ST depressions.  Cardiology consultation was obtained.  He was felt to require transfer to [facility] for further management and diagnostic evaluation.  He was started on Nitropaste and subsequently a nitroglycerin drip.  He was also started on heparin.  He is noted to have a previous nuclear stress in 2021.  EF at that time was 69% the study was noted to be normal with low risk.  On today's date he was taken to the cardiac catheterization lab where he was found to have severe three-vessel coronary artery disease.  Left ventricular ejection fraction was 55 to 65% by visual estimate.  LVEDP was normal.  Vessels are felt to be severely calcified.  An echocardiogram confirmed no significant valvular abnormalities.  He is noted to have some mild elevation in liver enzymes and total bilirubin.  Renal function is noted to be normal prior to catheterization.  LDL  cholesterol is 80.  He has a mild anemia and thrombocytopenia. He does not have a known history of diabetes. Ultimately Dr. Darcey Nora felt that coronary artery bypass grafting would be the best long-term treatment for this patient. The risks and benefits of coronary artery bypass grafting were discussed with the patient and the patient agreed to  move forward with the surgery.  Hospital Course: Mr. Groleau was taken to the operating room on 09/30/2021. He underwent CABG x 3.  He tolerated the procedure well was taken to the surgical intensive care unit in stable condition.  Postoperative hospital course: Patient was extubated using standard post cardiac surgical protocols without difficulty.  He is remained hemodynamically stable but did require Levophed and Neo-Synephrine which was weaned over time without difficulty maintaining stable hemodynamics..  He has had moderate early chest tube drainage and they have been kept in place postoperative day #1.  He does have an expected acute blood loss anemia which is being monitored closely.  Hemoglobin and hematocrit on postop day 1 were 8/22 respectively.  Most recent hemoglobin and hematocrit dated 10/04/2021 were 8.0/23.0 respectively.  He is being started on trinsicon for iron supplementation.  He does have some expected postoperative volume overload and was diuresed with good response.  He remained in a stable sinus rhythm.  He regained independent mobility without any difficulty.  Supplemental oxygen was also weaned off very early postop.  Patient wires were removed routinely on postop day 4.  He was ready for discharge to home on postop day 5.  Consults: cardiology  Significant Diagnostic Studies:   CLINICAL DATA:  Follow-up CABG surgery.   EXAM: CHEST - 2 VIEW   COMPARISON:  10/02/2021 and older studies.   FINDINGS: Since the previous day's exam, the right internal jugular introducer sheath has been removed.   There is persistent lung base opacity consistent with a combination of atelectasis and small effusions. Remainder of the lungs is clear.   No mediastinal widening.  No pneumothorax.   IMPRESSION: 1. No acute findings.  No evidence of an operative complication. 2. Mild persistent lung base atelectasis and small effusions. 3. All support apparatus has been removed.      Electronically Signed   By: Lajean Manes M.D.   On: 10/03/2021 09:21 .ris  Treatments: surgery:   Operative Report    DATE OF PROCEDURE: 09/30/2021   OPERATION:   1.  Coronary artery bypass grafting x3 (left internal mammary artery to LAD, saphenous vein graft to posterior descending, saphenous vein graft to circumflex marginal).  2.  Endoscopic harvest of right leg greater saphenous vein.   PREOPERATIVE DIAGNOSES:  Non-ST elevation myocardial infarction, unstable angina, severe 3-vessel coronary artery disease.   POSTOPERATIVE DIAGNOSES:  Non-ST elevation myocardial infarction, unstable angina, severe 3-vessel coronary artery disease.   SURGEON:  Len Childs, MD   ASSISTANT:  Enid Cutter, PA-C  Discharge Exam: Blood pressure 119/62, pulse 82, temperature 98.1 F (36.7 C), temperature source Oral, resp. rate 13, height '5\' 10"'$  (1.778 m), weight 68.6 kg, SpO2 100 %.  General appearance: alert, cooperative, and no distress Heart: regular rate and rhythm Lungs: clear to auscultation bilaterally Abdomen: benign Extremities: trace edema Wound: incis healing well  Discharge Medications:  The patient has been discharged on:   1.Beta Blocker:  Yes [ x  ]  No   [   ]                              If No, reason:  2.Ace Inhibitor/ARB: Yes [   ]                                     No  [  x  ]                                     If No, reason: Relatively low blood pressure  3.Statin:   Yes [   ]                  No  [  n ]                  If No, reason:  4.Ecasa:  Yes  [ x  ]                  No   [   ]                  If No, reason: Thrombocytopenia  Patient had ACS upon admission:  yes  Plavix/P2Y12 inhibitor: Yes [   ]                                      No  [ x  ]  Pt has thrombocytopenia.     Discharge Instructions     Amb Referral to Cardiac Rehabilitation   Complete by: As directed    Diagnosis: CABG   CABG X  ___: 3   After initial evaluation and assessments completed: Virtual Based Care may be provided alone or in conjunction with Phase 2 Cardiac Rehab based on patient barriers.: Yes   Discharge patient   Complete by: As directed    Discharge disposition: 01-Home or Self Care   Discharge patient date: 10/05/2021      Allergies as of 10/05/2021   No Known Allergies      Medication List     STOP taking these medications    lisinopril 20 MG tablet Commonly known as: ZESTRIL       TAKE these medications    acetaminophen 325 MG tablet Commonly known as: TYLENOL Take 2 tablets (650 mg total) by mouth every 6 (six) hours as needed for mild pain or moderate pain.   atorvastatin 80 MG tablet Commonly known as: LIPITOR Take 1 tablet (80 mg total) by mouth daily.   ferrous [id number] C-folic acid capsule Commonly known as: TRINSICON / FOLTRIN Take 1 capsule by mouth 2 (two) times daily after a meal.   metoprolol tartrate 25 MG tablet Commonly known as: LOPRESSOR Take 0.5 tablets (12.5 mg total) by mouth 2 (two) times daily.   pantoprazole 40 MG tablet Commonly known as: PROTONIX TAKE 1 TABLET BY MOUTH EVERY DAY   traMADol 50 MG tablet Commonly known as: ULTRAM Take 1 tablet (50 mg total) by mouth every 6 (six) hours as needed for up to 7 days for moderate pain.   triamcinolone ointment 0.1 % Commonly known as: KENALOG Apply 1 Application topically daily as  needed (For rash).   triamcinolone cream 0.1 % Commonly known as: KENALOG Apply 1 Application topically daily as needed (For rash).        Follow-up Information     Park Liter, MD Follow up on 10/15/2021.   Specialty: Cardiology Why: 8:00AM. Cardiology follow up. Contact information: [place] [address] [phone]         Dahlia Byes, MD. Go on 11/09/2021.   Specialty: Cardiothoracic Surgery Why: Your appointment is at 4 PM.   On the date you see Dr. Darcey Nora, please  obtain a chest x-ray at [facility] 1/2-hour prior to appointment.  It is located in the same office complex on the first floor. Contact information: [address] [place] [address] [address]. Follow up.   Why: Latricia Heft)- pre-op office referral for [facility] needs- they will contact you to schedule visits Contact information: [place] [address] [phone]                 Signed:  Deshone Giovanni, PA-C  10/06/2021, 9:40 AM    patient examined and medical record reviewed,agree with above note.    Discharge instructions reviewed with patient Dahlia Byes 10/06/2021

## 2021-10-02 NOTE — Addendum Note (Signed)
Addendum  created 10/02/21 1158 by Renato Shin, CRNA   Order list changed

## 2021-10-02 NOTE — Progress Notes (Signed)
Patient arrived to 4E from [facility]. Vitals taken and stable. Patient placed on tele monitor and CCMD notified. Patient oriented to unit and staff. Midsternal incision with dressing in place. Family at the bedside and call bell within reach. Timothy [name]

## 2021-10-02 NOTE — Progress Notes (Signed)
CARDIAC REHAB PHASE I   Pt resting in bed with wife at bedside. Pt has walked two times today plus oob to chair a few times. He states that pain is under control and he is able to move using sternal precautions fairly easily. Encouraged IS use. Was able to demonstrate getting to 1500 today. He plans to walk at least one more time later today. Will continue to follow.   [id number] Vanessa Barbara, RN BSN 10/02/2021 2:45 PM

## 2021-10-02 NOTE — Progress Notes (Signed)
Progress Note  Patient Name: Timothy [name] Date of Encounter: 10/02/2021  [facility] Cardiologist: None   Subjective   POD2 s/p LIMA to LAD, VG to PDA, VG to Lcx  Weaned off pressors.  MAP 70-80s; I/O +360  Transfused Hgb > 8  No complaints, feels well   Inpatient Medications    Scheduled Meds:  acetaminophen  1,000 mg Oral Q6H   atorvastatin  80 mg Oral Daily   bisacodyl  10 mg Oral Daily   Or   bisacodyl  10 mg Rectal Daily   Chlorhexidine Gluconate Cloth  6 each Topical Daily   [facility], Patient & Family Education   Does not apply Once   docusate sodium  200 mg Oral Daily   ferrous [id number] C-folic acid  1 capsule Oral BID PC   furosemide  20 mg Intravenous BID   insulin aspart  0-24 Units Subcutaneous TID AC & HS   metoprolol tartrate  12.5 mg Oral BID   pantoprazole  40 mg Oral Daily   sodium chloride flush  3 mL Intravenous Q12H   sodium chloride flush  3 mL Intravenous Q12H   Continuous Infusions:  sodium chloride     insulin Stopped (10/01/21 1211)   lactated ringers 10 mL/hr at 10/02/21 0900   norepinephrine (LEVOPHED) Adult infusion Stopped (10/01/21 0743)   phenylephrine (NEO-SYNEPHRINE) Adult infusion 17 mcg/min (10/01/21 0700)   PRN Meds: sodium chloride, dextrose, fentaNYL (SUBLIMAZE) injection, magnesium hydroxide, metoprolol tartrate, ondansetron (ZOFRAN) IV, mouth rinse, oxyCODONE, sodium chloride flush, sodium chloride flush, traMADol   Vital Signs    Vitals:   10/02/21 0800 10/02/21 0815 10/02/21 0830 10/02/21 0900  BP: 128/68   (!) 118/58  Pulse: 89 89 73 72  Resp: '14 15 15 12  '$ Temp:      TempSrc:      SpO2: 99% 98% 100% 100%  Weight:      Height:        Intake/Output Summary (Last 24 hours) at 10/02/2021 0935 Last data filed at 10/02/2021 0900 Gross per 24 hour  Intake 1261.46 ml  Output 1425 ml  Net -163.54 ml      10/02/2021    5:00 AM 10/01/2021    5:00 AM 09/27/2021    2:12 PM  Last 3  Weights  Weight (lbs) 151 lb 3.8 oz 151 lb 7.3 oz 140 lb 3.2 oz  Weight (kg) 68.6 kg 68.7 kg 63.594 kg      Telemetry    Occ A-pacing; mostly SR- Personally Reviewed  ECG    A-paced (EKG from yesteray)  Physical Exam   GEN: No acute distress.   Neck: RIJ swan Cardiac: RRR, no murmurs, rubs, or gallops. Sternal dressing in place Respiratory: CTAB GI: Soft, nontender, non-distended; sparse BS MS: No edema; No deformity. Neuro:  Nonfocal  Psych: Normal affect   Labs    High Sensitivity Troponin:   Recent Labs  Lab 09/27/21 1001 09/27/21 1204 09/27/21 1802 09/27/21 2020  TROPONINIHS 111* 243* 1,052* 1,592*     Chemistry Recent Labs  Lab 09/27/21 1001 09/28/21 0235 09/29/21 0424 09/30/21 0223 09/30/21 2030 10/01/21 0410 10/01/21 1656 10/02/21 0402  NA 131* 134* 135   < > 135 136 133* 130*  K 4.0 3.8 3.4*   < > 4.2 4.2 3.9 3.8  CL 99 105 107   < > 107 109 104 99  CO2 22 20* 20*   < > 20* '22 24 23  '$ GLUCOSE 138*  130* 118*   < > 137* 119* 152* 155*  BUN 9 7* 8   < > 8 7* 7* 5*  CREATININE 0.82 0.82 0.87   < > 0.83 0.81 0.82 0.83  CALCIUM 9.6 9.1 9.1   < > 7.7* 8.1* 8.4* 8.4*  MG  --   --   --   --  2.7* 2.2 2.1  --   PROT 7.8 6.4* 6.2*  --   --   --   --   --   ALBUMIN 4.4 3.7 3.6  --   --   --   --   --   AST 128* 89* 71*  --   --   --   --   --   ALT 84* 66* 57*  --   --   --   --   --   ALKPHOS 83 68 62  --   --   --   --   --   BILITOT 1.1 2.4* 2.3*  --   --   --   --   --   GFRNONAA >60 >60 >60   < > >60 >60 >60 >60  ANIONGAP '10 9 8   '$ < > '8 5 5 8   '$ < > = values in this interval not displayed.    Lipids  Recent Labs  Lab 09/28/21 0235  CHOL 146  TRIG 132  HDL 40*  LDLCALC 80  CHOLHDL 3.7    Hematology Recent Labs  Lab 10/01/21 0410 10/01/21 1656 10/02/21 0402  WBC 5.7 6.4 5.7  RBC 2.32* 2.18* 2.59*  HGB 8.0* 7.5* 8.7*  HCT 22.7* 21.6* 25.7*  MCV 97.8 99.1 99.2  MCH 34.5* 34.4* 33.6  MCHC 35.2 34.7 33.9  RDW 14.9 15.0 14.6  PLT 62*  45* 62*   Thyroid  Recent Labs  Lab 09/29/21 0424  TSH 1.440    BNPNo results for input(s): "BNP", "PROBNP" in the last 168 hours.  DDimer  Recent Labs  Lab 09/27/21 1001  DDIMER 0.31     Radiology    DG Chest Port 1 View  Result Date: 10/02/2021 CLINICAL DATA:  Chest tube removal with sore chest EXAM: PORTABLE CHEST 1 VIEW COMPARISON:  Yesterday FINDINGS: Stable heart size and mediastinal contours. CABG. Chest tubes have been removed. Removal of Swan-Ganz catheter. Opacity at the left more than right lung base. No definite or significant pleural effusion. No visible pneumothorax. IMPRESSION: Unchanged postoperative atelectasis.  No visible pneumothorax. Electronically Signed   By: Jorje Guild M.D.   On: 10/02/2021 07:36   DG Chest Port 1 View  Result Date: 10/01/2021 CLINICAL DATA:  Chest tube present status post CABG. EXAM: PORTABLE CHEST 1 VIEW COMPARISON:  Chest radiograph 1 day prior. FINDINGS: The endotracheal tube tip terminates approximately 7.5 cm from the carina at the level of the clavicular heads, similar to the prior study. The right IJ Swan-Ganz catheter terminates in the right pulmonary artery. A presumed mediastinal drain terminating over the midline is unchanged. Bilateral chest tubes are unchanged. The cardiomediastinal silhouette is stable There is worsening hazy opacity projecting over the left lower lobe which may reflect a developing pleural effusion and adjacent atelectasis/consolidation. There is no significant right effusion. There is no appreciable pneumothorax. The bones are stable. IMPRESSION: 1. New hazy opacity over the left lower lobe which may reflect a developing pleural effusion and adjacent atelectasis/consolidation. 2. Support devices as above. 3. No appreciable pneumothorax. Electronically Signed   By: Collier Salina  Noone M.D.   On: 10/01/2021 08:22   DG Chest Port 1 View  Result Date: 09/30/2021 CLINICAL DATA:  Pneumothorax. EXAM: PORTABLE CHEST 1 VIEW  COMPARISON:  Chest x-ray 09/27/2021 FINDINGS: Endotracheal tube tip is 6 cm above the carina. Bilateral chest tubes and mediastinal drain are present. There is a Swan-Ganz catheter with distal tip overlying the right main pulmonary artery. There are new sternotomy wires. The cardiomediastinal silhouette is within normal limits. There is minimal strandy opacities in the left lung base favored is atelectasis. There is no evidence for pleural effusion or pneumothorax. No acute fractures are seen. IMPRESSION: 1. Postsurgical changes.  No pneumothorax. 2. Left basilar atelectasis. 3. Endotracheal tube tip 6 cm above carina. 4. Swan-Ganz catheter tip projects over the right main pulmonary artery. Electronically Signed   By: Ronney Asters M.D.   On: 09/30/2021 15:04    Cardiac Studies   Echo 7/31 EF 60-65% without significant valvular issues  Cath 7/31 Prox RCA lesion is 99% stenosed.   Mid RCA to Dist RCA lesion is 99% stenosed.   Prox Cx to Mid Cx lesion is 70% stenosed.   Prox LAD to Mid LAD lesion is 75% stenosed.   Prox LAD lesion is 75% stenosed.   Mid LAD lesion is 99% stenosed.   The left ventricular systolic function is normal.   LV end diastolic pressure is normal.   The left ventricular ejection fraction is 55-65% by visual estimate    Patient Profile     71 y.o. 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  Cont ASA, atorvastatin; BB and plavix when able.  HL:  Cont high dose statin. HTN:  BP controlled now, starting BB Anemia:  Resolved  For questions or updates, please contact [place] Please consult [email or web address] for contact info under        Signed, Early Osmond, MD  10/02/2021, 9:35 AM

## 2021-10-02 NOTE — Progress Notes (Addendum)
[address]       [place] [address]             [phone]      2 Days Post-Op Procedure(s) (LRB): CORONARY ARTERY BYPASS GRAFTING (CABG) X 3 BYPASSES USING LEFT INTERNAL MAMMARY ARTERY AND RIGHT LEG ENDOHARVESTED SAPHENOUS VEIN GRAFT (N/A) TRANSESOPHAGEAL ECHOCARDIOGRAM (TEE) (N/A) Subjective: Conts to feel well  Objective: Vital signs in last 24 hours: Temp:  [96.8 F (36 C)-98.7 F (37.1 C)] 98.1 F (36.7 C) (08/04 0700) Pulse Rate:  [80-91] 89 (08/04 0700) Cardiac Rhythm: Normal sinus rhythm (08/04 0400) Resp:  [10-22] 15 (08/04 0700) BP: (98-125)/(56-79) 114/68 (08/04 0700) SpO2:  [90 %-98 %] 97 % (08/04 0700) Arterial Line BP: (112-127)/(40-64) 117/64 (08/03 2044) Weight:  [68.6 kg] 68.6 kg (08/04 0500)  Hemodynamic parameters for last 24 hours: PAP: (18-21)/(6-8) 18/6  Intake/Output from previous day: 08/03 0701 - 08/04 0700 In: 1917.7 [I.V.:704.2; Blood:683.5; IV Piggyback:530] Out: 1660 [Urine:1180; Chest Tube:480] Intake/Output this shift: No intake/output data recorded.  General appearance: alert, cooperative, and no distress Heart: regular rate and rhythm Lungs: min dim in bases Abdomen: benign Extremities: minor edema Wound: dressings CDI  Lab Results: Recent Labs    10/01/21 1656 10/02/21 0402  WBC 6.4 5.7  HGB 7.5* 8.7*  HCT 21.6* 25.7*  PLT 45* 62*   BMET:  Recent Labs    10/01/21 1656 10/02/21 0402  NA 133* 130*  K 3.9 3.8  CL 104 99  CO2 24 23  GLUCOSE 152* 155*  BUN 7* 5*  CREATININE 0.82 0.83  CALCIUM 8.4* 8.4*    PT/INR:  Recent Labs    09/30/21 1524  LABPROT 16.6*  INR 1.4*   ABG    Component Value Date/Time   PHART 7.320 (L) 09/30/2021 1916   HCO3 22.3 09/30/2021 1916   TCO2 24 09/30/2021 1916   ACIDBASEDEF 4.0 (H) 09/30/2021 1916   O2SAT 99 09/30/2021 1916   CBG (last 3)  Recent Labs    10/01/21 2034 10/01/21 2334 10/02/21 0359  GLUCAP 131* 122* 124*    Meds Scheduled  Meds:  acetaminophen  1,000 mg Oral Q6H   atorvastatin  80 mg Oral Daily   bisacodyl  10 mg Oral Daily   Or   bisacodyl  10 mg Rectal Daily   Chlorhexidine Gluconate Cloth  6 each Topical Daily   docusate sodium  200 mg Oral Daily   ferrous [id number] C-folic acid  1 capsule Oral BID PC   furosemide  20 mg Intravenous BID   insulin aspart  0-24 Units Subcutaneous Q4H   metoprolol tartrate  12.5 mg Oral BID   pantoprazole  40 mg Oral Daily   sodium chloride flush  3 mL Intravenous Q12H   Continuous Infusions:  insulin Stopped (10/01/21 1211)   lactated ringers 5 mL/hr at 10/02/21 0500   nitroGLYCERIN 0 mcg/min (09/30/21 1505)   norepinephrine (LEVOPHED) Adult infusion Stopped (10/01/21 0743)   phenylephrine (NEO-SYNEPHRINE) Adult infusion 17 mcg/min (10/01/21 0700)   PRN Meds:.dextrose, fentaNYL (SUBLIMAZE) injection, metoprolol tartrate, midazolam, ondansetron (ZOFRAN) IV, mouth rinse, oxyCODONE, sodium chloride flush, traMADol  Xrays DG Chest Port 1 View  Result Date: 10/01/2021 CLINICAL DATA:  Chest tube present status post CABG. EXAM: PORTABLE CHEST 1 VIEW COMPARISON:  Chest radiograph 1 day prior. FINDINGS: The endotracheal tube tip terminates approximately 7.5 cm from the carina at the level of the clavicular heads, similar to the prior study. The right IJ Swan-Ganz  catheter terminates in the right pulmonary artery. A presumed mediastinal drain terminating over the midline is unchanged. Bilateral chest tubes are unchanged. The cardiomediastinal silhouette is stable There is worsening hazy opacity projecting over the left lower lobe which may reflect a developing pleural effusion and adjacent atelectasis/consolidation. There is no significant right effusion. There is no appreciable pneumothorax. The bones are stable. IMPRESSION: 1. New hazy opacity over the left lower lobe which may reflect a developing pleural effusion and adjacent atelectasis/consolidation. 2. Support  devices as above. 3. No appreciable pneumothorax. Electronically Signed   By: Valetta Mole M.D.   On: 10/01/2021 08:22   DG Chest Port 1 View  Result Date: 09/30/2021 CLINICAL DATA:  Pneumothorax. EXAM: PORTABLE CHEST 1 VIEW COMPARISON:  Chest x-ray 09/27/2021 FINDINGS: Endotracheal tube tip is 6 cm above the carina. Bilateral chest tubes and mediastinal drain are present. There is a Swan-Ganz catheter with distal tip overlying the right main pulmonary artery. There are new sternotomy wires. The cardiomediastinal silhouette is within normal limits. There is minimal strandy opacities in the left lung base favored is atelectasis. There is no evidence for pleural effusion or pneumothorax. No acute fractures are seen. IMPRESSION: 1. Postsurgical changes.  No pneumothorax. 2. Left basilar atelectasis. 3. Endotracheal tube tip 6 cm above carina. 4. Swan-Ganz catheter tip projects over the right main pulmonary artery. Electronically Signed   By: Ronney Asters M.D.   On: 09/30/2021 15:04    Assessment/Plan: S/P Procedure(s) (LRB): CORONARY ARTERY BYPASS GRAFTING (CABG) X 3 BYPASSES USING LEFT INTERNAL MAMMARY ARTERY AND RIGHT LEG ENDOHARVESTED SAPHENOUS VEIN GRAFT (N/A) TRANSESOPHAGEAL ECHOCARDIOGRAM (TEE) (N/A)  POD#2 overall doing well 1 afeb, VSS, no gtts, sinus rhythm 2 sats good on RA 3 good UOP, weight about 5 KG >preop- cont lasix 4 mild hyponatremia- monitor closely on lasix 5 H/H and platelet trend improved after transfusons- no heparin products, on Iron as well, may need to consider HIT panel 6 CXR minor atx/effus 7 routine rehab/pulm hygiene    LOS: 5 days    Otto Giovanni PA-C Pager [phone] 10/02/2021   Patient had a good night and is progressing and is now able to walk comfortably in the hallway.  He is off oxygen and hemoglobin and platelet counts have stabilized.  Will be transferred to progressive care for medication titration, removal of pacing wires [when platelet count  adequate] in preparation for discharge probably Monday.  The patient required urgent cardiac surgery for non-STEMI with accelerating angina even on IV heparin and nitroglycerin.  His history and presentation of significant weight loss over the past 18 months associated with anemia and thrombocytopenia will need further evaluation as an outpatient by internal medicine.  patient examined and medical record reviewed,agree with above note. Dahlia Byes 10/02/2021

## 2021-10-02 NOTE — Progress Notes (Signed)
Report given to [facility]. VSS.

## 2021-10-03 ENCOUNTER — Inpatient Hospital Stay (HOSPITAL_COMMUNITY): Payer: Medicare Other

## 2021-10-03 LAB — GLUCOSE, CAPILLARY: Glucose-Capillary: 116 mg/dL — ABNORMAL HIGH (ref 70–99)

## 2021-10-03 LAB — BASIC METABOLIC PANEL
Anion gap: 6 (ref 5–15)
BUN: 7 mg/dL — ABNORMAL LOW (ref 8–23)
CO2: 25 mmol/L (ref 22–32)
Calcium: 8.6 mg/dL — ABNORMAL LOW (ref 8.9–10.3)
Chloride: 100 mmol/L (ref 98–111)
Creatinine, Ser: 0.91 mg/dL (ref 0.61–1.24)
GFR, Estimated: >60 mL/min (ref >60)
Glucose, Bld: 113 mg/dL — ABNORMAL HIGH (ref 70–99)
Potassium: 4 mmol/L (ref 3.5–5.1)
Sodium: 131 mmol/L — ABNORMAL LOW (ref 135–145)

## 2021-10-03 LAB — CBC
HCT: 24.3 % — ABNORMAL LOW (ref 39.0–52.0)
Hemoglobin: 8.5 g/dL — ABNORMAL LOW (ref 13.0–17.0)
MCH: 33.6 pg (ref 26.0–34.0)
MCHC: 35 g/dL (ref 30.0–36.0)
MCV: 96 fL (ref 80.0–100.0)
Platelets: 66 10*3/uL — ABNORMAL LOW (ref 150–400)
RBC: 2.53 MIL/uL — ABNORMAL LOW (ref 4.22–5.81)
RDW: 14.4 % (ref 11.5–15.5)
WBC: 5.5 10*3/uL (ref 4.0–10.5)
nRBC: 0 % (ref 0.0–0.2)

## 2021-10-03 MED ORDER — FUROSEMIDE 40 MG PO TABS
40.0000 mg | ORAL_TABLET | Freq: Every day | ORAL | Status: DC
  Administered 2021-10-03: 40 mg via ORAL
  Filled 2021-10-03: qty 1

## 2021-10-03 MED ORDER — POTASSIUM CHLORIDE CRYS ER 20 MEQ PO TBCR
20.0000 meq | EXTENDED_RELEASE_TABLET | Freq: Every day | ORAL | Status: DC
  Administered 2021-10-03: 20 meq via ORAL
  Filled 2021-10-03: qty 1

## 2021-10-03 NOTE — Progress Notes (Addendum)
      [address]       [place] [address]             [phone]      3 Days Post-Op Procedure(s) (LRB): CORONARY ARTERY BYPASS GRAFTING (CABG) X 3 BYPASSES USING LEFT INTERNAL MAMMARY ARTERY AND RIGHT LEG ENDOHARVESTED SAPHENOUS VEIN GRAFT (N/A) TRANSESOPHAGEAL ECHOCARDIOGRAM (TEE) (N/A) Subjective:  Up in the bedside chair, says he feels great and is anxious to return home.  Having no trouble with ambulation. Pulls 1700cc on IS. BM today.   Objective: Vital signs in last 24 hours: Temp:  [98.1 F (36.7 C)-98.7 F (37.1 C)] 98.3 F (36.8 C) (08/05 0750) Pulse Rate:  [63-77] 75 (08/05 0750) Cardiac Rhythm: Normal sinus rhythm;Bundle branch block (08/04 1900) Resp:  [10-21] 18 (08/05 0750) BP: (100-120)/(57-75) 115/58 (08/05 0750) SpO2:  [93 %-100 %] 100 % (08/05 0750) Weight:  [69.1 kg] 69.1 kg (08/05 0358)     Intake/Output from previous day: 08/04 0701 - 08/05 0700 In: 363.4 [P.O.:340; I.V.:23.4] Out: 600 [Urine:600] Intake/Output this shift: No intake/output data recorded.  General appearance: alert, cooperative, and no distress Neurologic: intact Heart: RRR, no arrhythmias on monitor review. Lungs: breath sounds clear and full.  Abdomen: soft, NT Extremities: trace peripheral edema. RLE EVH incision is intact and dry Wound: The sternotomy incision is well approximated and dry.  Lab Results: Recent Labs    10/02/21 0402 10/03/21 0201  WBC 5.7 5.5  HGB 8.7* 8.5*  HCT 25.7* 24.3*  PLT 62* 66*   BMET:  Recent Labs    10/02/21 0402 10/03/21 0201  NA 130* 131*  K 3.8 4.0  CL 99 100  CO2 23 25  GLUCOSE 155* 113*  BUN 5* 7*  CREATININE 0.83 0.91  CALCIUM 8.4* 8.6*    PT/INR:  Recent Labs    09/30/21 1524  LABPROT 16.6*  INR 1.4*   ABG    Component Value Date/Time   PHART 7.320 (L) 09/30/2021 1916   HCO3 22.3 09/30/2021 1916   TCO2 24 09/30/2021 1916   ACIDBASEDEF 4.0 (H) 09/30/2021 1916   O2SAT 99 09/30/2021 1916    CBG (last 3)  Recent Labs    10/02/21 1705 10/02/21 2105 10/03/21 0613  GLUCAP 124* 103* 116*    Assessment/Plan: S/P Procedure(s) (LRB): CORONARY ARTERY BYPASS GRAFTING (CABG) X 3 BYPASSES USING LEFT INTERNAL MAMMARY ARTERY AND RIGHT LEG ENDOHARVESTED SAPHENOUS VEIN GRAFT (N/A) TRANSESOPHAGEAL ECHOCARDIOGRAM (TEE) (N/A)  -POD3 CABG x 3  after presenting with acute NSTEMI, preserved EF. VS and cardiac rhythm stable and progressing well with mobility. On statin, BB.   -Thrombocytopenia- present pre-op and dropped to 42,000 post-op. Now trending back up. Also had unintended weight loss prior to surgery. Will need further work up by internal medicine as outpatient.  Holding ASA and will hold off on removing the pacer wires until closer to 100K.  -Expected acute blood loss anemia- stable and tolerating well. Continue Trinsicon.   -Disposition- planning for discharge to home early next week.    LOS: 6 days    Antony Odea, PA-C 10/03/2021   Chart reviewed, patient examined, agree with above. He feels great and anxious to go home. Will remove pacing wires in am as long as plts > 60K and plan home Monday am.

## 2021-10-03 NOTE — Progress Notes (Signed)
CARDIAC REHAB PHASE I   Offered to walk with pt. Pt waiting on wife for a protein shake and then will walk. Pt was hopeful to d/c tomorrow, but understands reasoning as to why. Pt able to demonstrate 2250 on IS. Will return to ambulate.  [phone] Rufina Falco, RN BSN 10/03/2021 10:27 AM

## 2021-10-03 NOTE — Progress Notes (Signed)
CARDIAC REHAB PHASE I   PRE:  Rate/Rhythm: 75 SR  BP:  Sitting: 118/59      SaO2: 100 RA  MODE:  Ambulation: 470 ft   POST:  Rate/Rhythm: 87 SR  BP:  Sitting: 110/59    SaO2: 96 RA   Pt ambulated 474f in hallway independently with steady gait. Pt denies CP, SOB, or dizziness. Pt returned to recliner. Stressed importance of not over doing it. Began education with pt and wife. Encouraged pt to write down additional questions. Pt cleared to ambulate in hallway with wife. Will continue to follow.  [place] RN BSN 10/03/2021 12:27 PM

## 2021-10-04 LAB — BASIC METABOLIC PANEL
Anion gap: 7 (ref 5–15)
BUN: 9 mg/dL (ref 8–23)
CO2: 25 mmol/L (ref 22–32)
Calcium: 8.6 mg/dL — ABNORMAL LOW (ref 8.9–10.3)
Chloride: 98 mmol/L (ref 98–111)
Creatinine, Ser: 0.95 mg/dL (ref 0.61–1.24)
GFR, Estimated: >60 mL/min (ref >60)
Glucose, Bld: 118 mg/dL — ABNORMAL HIGH (ref 70–99)
Potassium: 3.4 mmol/L — ABNORMAL LOW (ref 3.5–5.1)
Sodium: 130 mmol/L — ABNORMAL LOW (ref 135–145)

## 2021-10-04 LAB — CBC
HCT: 23 % — ABNORMAL LOW (ref 39.0–52.0)
Hemoglobin: 8 g/dL — ABNORMAL LOW (ref 13.0–17.0)
MCH: 34 pg (ref 26.0–34.0)
MCHC: 34.8 g/dL (ref 30.0–36.0)
MCV: 97.9 fL (ref 80.0–100.0)
Platelets: 76 10*3/uL — ABNORMAL LOW (ref 150–400)
RBC: 2.35 MIL/uL — ABNORMAL LOW (ref 4.22–5.81)
RDW: 14.5 % (ref 11.5–15.5)
WBC: 4.7 10*3/uL (ref 4.0–10.5)
nRBC: 0 % (ref 0.0–0.2)

## 2021-10-04 MED ORDER — POTASSIUM CHLORIDE CRYS ER 20 MEQ PO TBCR
20.0000 meq | EXTENDED_RELEASE_TABLET | ORAL | Status: AC
  Administered 2021-10-04 (×3): 20 meq via ORAL
  Filled 2021-10-04 (×3): qty 1

## 2021-10-04 NOTE — Progress Notes (Addendum)
      [address]       [place] [address]             [phone]      4 Days Post-Op Procedure(s) (LRB): CORONARY ARTERY BYPASS GRAFTING (CABG) X 3 BYPASSES USING LEFT INTERNAL MAMMARY ARTERY AND RIGHT LEG ENDOHARVESTED SAPHENOUS VEIN GRAFT (N/A) TRANSESOPHAGEAL ECHOCARDIOGRAM (TEE) (N/A) Subjective:  Up in the bedside chair, says he feels great and is wants to go home.  Independent with mobility.  Pain is well controlled.  Tolerating p.o.'s and had a bowel movement yesterday.  Objective: Vital signs in last 24 hours: Temp:  [98.1 F (36.7 C)-98.5 F (36.9 C)] 98.1 F (36.7 C) (08/06 0800) Pulse Rate:  [72-80] 80 (08/06 0800) Cardiac Rhythm: Normal sinus rhythm (08/06 0700) Resp:  [13-18] 13 (08/06 0800) BP: (105-118)/(53-70) 112/70 (08/06 0800) SpO2:  [95 %-100 %] 96 % (08/06 0800) Weight:  [67.4 kg] 67.4 kg (08/06 0605)     Intake/Output from previous day: 08/05 0701 - 08/06 0700 In: 120 [P.O.:120] Out: 2 [Urine:1; Stool:1] Intake/Output this shift: No intake/output data recorded.  General appearance: alert, cooperative, and no distress Neurologic: intact Heart: RRR, no arrhythmias on monitor review. Lungs: breath sounds clear and full.  Abdomen: soft, NT Extremities: trace peripheral edema. RLE EVH incision is intact and dry Wound: The sternotomy incision is well approximated and dry.  Lab Results: Recent Labs    10/03/21 0201 10/04/21 0307  WBC 5.5 4.7  HGB 8.5* 8.0*  HCT 24.3* 23.0*  PLT 66* 76*    BMET:  Recent Labs    10/03/21 0201 10/04/21 0307  NA 131* 130*  K 4.0 3.4*  CL 100 98  CO2 25 25  GLUCOSE 113* 118*  BUN 7* 9  CREATININE 0.91 0.95  CALCIUM 8.6* 8.6*     PT/INR:  No results for input(s): "LABPROT", "INR" in the last 72 hours.  ABG    Component Value Date/Time   PHART 7.320 (L) 09/30/2021 1916   HCO3 22.3 09/30/2021 1916   TCO2 24 09/30/2021 1916   ACIDBASEDEF 4.0 (H) 09/30/2021 1916   O2SAT 99  09/30/2021 1916   CBG (last 3)  Recent Labs    10/02/21 1705 10/02/21 2105 10/03/21 0613  GLUCAP 124* 103* 116*     Assessment/Plan: S/P Procedure(s) (LRB): CORONARY ARTERY BYPASS GRAFTING (CABG) X 3 BYPASSES USING LEFT INTERNAL MAMMARY ARTERY AND RIGHT LEG ENDOHARVESTED SAPHENOUS VEIN GRAFT (N/A) TRANSESOPHAGEAL ECHOCARDIOGRAM (TEE) (N/A)  -POD4 CABG x 3  after presenting with acute NSTEMI, preserved EF. VS and cardiac rhythm stable and progressing well with mobility. On statin, BB. Remove the pacer wires today.   -Thrombocytopenia- present pre-op and dropped to 42,000 post-op. Now trending back up. 76,000 today.  Also had unintended weight loss prior to surgery. Will need further work up by internal medicine as outpatient.  Holding ASA .  -Expected acute blood loss anemia- stable and tolerating well. Continue Trinsicon.   -Disposition- planning for discharge to home tomorrow.    LOS: 7 days    Antony Odea, PA-C 10/04/2021   Chart reviewed, patient examined, agree with above. Doing well. DC pacing wires today and plan home tomorrow.

## 2021-10-04 NOTE — Progress Notes (Signed)
Patient called about his right leg harvest incision bleeding.   Small separation noted. Placed gauze and tape. Told patient to let this RN know if it continues to bleed.   Will continue to monitor

## 2021-10-04 NOTE — Progress Notes (Signed)
Pacing wires removed , pt tolerated well,Educated on bedrest   Phoebe Sharps, RN   10/04/21 0800  Vitals  Temp 98.1 F (36.7 C)  Temp Source Oral  BP 112/70  MAP (mmHg) 83  BP Location Left Arm  BP Method Automatic  Patient Position (if appropriate) Lying  Pulse Rate 80  Pulse Rate Source Monitor  ECG Heart Rate 77  Resp 13  Level of Consciousness  Level of Consciousness Alert  Oxygen Therapy  SpO2 96 %  O2 Device Room Air  O2 Flow Rate (L/min) 0 L/min  Pain Assessment  Pain Scale 0-10  Pain Score 0  MEWS Score  MEWS Temp 0  MEWS Systolic 0  MEWS Pulse 0  MEWS RR 1  MEWS LOC 0  MEWS Score 1  MEWS Score Color Green

## 2021-10-05 ENCOUNTER — Encounter: Payer: Self-pay | Admitting: Registered Nurse

## 2021-10-05 LAB — BASIC METABOLIC PANEL
Anion gap: 8 (ref 5–15)
BUN: 7 mg/dL — ABNORMAL LOW (ref 8–23)
CO2: 26 mmol/L (ref 22–32)
Calcium: 8.9 mg/dL (ref 8.9–10.3)
Chloride: 101 mmol/L (ref 98–111)
Creatinine, Ser: 0.87 mg/dL (ref 0.61–1.24)
GFR, Estimated: >60 mL/min (ref >60)
Glucose, Bld: 116 mg/dL — ABNORMAL HIGH (ref 70–99)
Potassium: 4 mmol/L (ref 3.5–5.1)
Sodium: 135 mmol/L (ref 135–145)

## 2021-10-05 MED ORDER — TRAMADOL HCL 50 MG PO TABS: 50.0000 mg | ORAL_TABLET | Freq: Four times a day (QID) | ORAL | 0 refills | Status: AC | PRN

## 2021-10-05 MED ORDER — FE FUMARATE-B12-VIT C-FA-IFC PO CAPS: 1.0000 | ORAL_CAPSULE | Freq: Two times a day (BID) | ORAL | 0 refills | Status: DC

## 2021-10-05 MED ORDER — ATORVASTATIN CALCIUM 80 MG PO TABS: 80.0000 mg | ORAL_TABLET | Freq: Every day | ORAL | 2 refills | Status: DC

## 2021-10-05 MED ORDER — METOPROLOL TARTRATE 25 MG PO TABS: 12.5000 mg | ORAL_TABLET | Freq: Two times a day (BID) | ORAL | 2 refills | Status: DC

## 2021-10-05 MED ORDER — ACETAMINOPHEN 325 MG PO TABS: 650.0000 mg | ORAL_TABLET | Freq: Four times a day (QID) | ORAL | Status: DC | PRN

## 2021-10-05 NOTE — Progress Notes (Addendum)
      [address]       [place] [address]             [phone]      5 Days Post-Op Procedure(s) (LRB): CORONARY ARTERY BYPASS GRAFTING (CABG) X 3 BYPASSES USING LEFT INTERNAL MAMMARY ARTERY AND RIGHT LEG ENDOHARVESTED SAPHENOUS VEIN GRAFT (N/A) TRANSESOPHAGEAL ECHOCARDIOGRAM (TEE) (N/A) Subjective: Feels well, anxious to go home  Objective: Vital signs in last 24 hours: Temp:  [98 F (36.7 C)-98.5 F (36.9 C)] 98.3 F (36.8 C) (08/07 0339) Pulse Rate:  [76-83] 83 (08/07 0339) Cardiac Rhythm: Normal sinus rhythm (08/06 2300) Resp:  [13-21] 21 (08/07 0339) BP: (112-116)/(58-74) 115/61 (08/07 0339) SpO2:  [95 %-100 %] 98 % (08/07 0339) Weight:  [68.6 kg] 68.6 kg (08/06 2053)  Hemodynamic parameters for last 24 hours:    Intake/Output from previous day: 08/06 0701 - 08/07 0700 In: 648 [P.O.:240; I.V.:408] Out: -  Intake/Output this shift: No intake/output data recorded.  General appearance: alert, cooperative, and no distress Heart: regular rate and rhythm Lungs: clear to auscultation bilaterally Abdomen: benign Extremities: trace edema Wound: incis healing well  Lab Results: Recent Labs    10/03/21 0201 10/04/21 0307  WBC 5.5 4.7  HGB 8.5* 8.0*  HCT 24.3* 23.0*  PLT 66* 76*   BMET:  Recent Labs    10/04/21 0307 10/05/21 0253  NA 130* 135  K 3.4* 4.0  CL 98 101  CO2 25 26  GLUCOSE 118* 116*  BUN 9 7*  CREATININE 0.95 0.87  CALCIUM 8.6* 8.9    PT/INR: No results for input(s): "LABPROT", "INR" in the last 72 hours. ABG    Component Value Date/Time   PHART 7.320 (L) 09/30/2021 1916   HCO3 22.3 09/30/2021 1916   TCO2 24 09/30/2021 1916   ACIDBASEDEF 4.0 (H) 09/30/2021 1916   O2SAT 99 09/30/2021 1916   CBG (last 3)  Recent Labs    10/02/21 1705 10/02/21 2105 10/03/21 0613  GLUCAP 124* 103* 116*    Meds Scheduled Meds:  acetaminophen  1,000 mg Oral Q6H   atorvastatin  80 mg Oral Daily   bisacodyl  10 mg Oral Daily    Or   bisacodyl  10 mg Rectal Daily   docusate sodium  200 mg Oral Daily   ferrous [id number] C-folic acid  1 capsule Oral BID PC   metoprolol tartrate  12.5 mg Oral BID   pantoprazole  40 mg Oral Daily   sodium chloride flush  3 mL Intravenous Q12H   Continuous Infusions:  sodium chloride     lactated ringers Stopped (10/02/21 0946)   PRN Meds:.sodium chloride, fentaNYL (SUBLIMAZE) injection, magnesium hydroxide, metoprolol tartrate, ondansetron (ZOFRAN) IV, mouth rinse, oxyCODONE, sodium chloride flush, traMADol  Xrays No results found.  Assessment/Plan: S/P Procedure(s) (LRB): CORONARY ARTERY BYPASS GRAFTING (CABG) X 3 BYPASSES USING LEFT INTERNAL MAMMARY ARTERY AND RIGHT LEG ENDOHARVESTED SAPHENOUS VEIN GRAFT (N/A) TRANSESOPHAGEAL ECHOCARDIOGRAM (TEE) (N/A) POD#5  1 afeb, VSS, sinus rhythm 2 sats good on RA 3 weight stable post op, UOP not recorded, normal renal fxn 4 ASA held with low platelets 5 stable for d/c today, reviewed instructions    LOS: 8 days    Emarion Giovanni PA-C Pager [phone] 10/05/2021  81 asa ok now EPWs out DC instructions reviewed with patient  patient examined and medical record reviewed,agree with above note. Dahlia Byes 10/05/2021

## 2021-10-05 NOTE — Care Management Important Message (Signed)
Important Message  Patient Details  Name: Timothy [name] MRN: [id number] Date of Birth: [birth year]   Medicare Important Message Given:  Yes     [name] 10/05/2021, 8:31 AM

## 2021-10-05 NOTE — Progress Notes (Signed)
CARDIAC REHAB PHASE I      Pt in room walking around independently packing up for discharge home. Post [facility] education including heart healthy diet, move in tub, sternal precautions, site care, IS use, restrictions, home needs, and CRP2. All questions and concerns addressed. Pt is eager to go home. He is moving around well encouraged him to pace himself at home to avoid overdoing it with activity. Will refer to [facility] for CRP2.   [phone]  Vanessa Barbara, RN BSN 10/05/2021 8:33 AM

## 2021-10-05 NOTE — Addendum Note (Signed)
Addendum  created 10/05/21 0958 by Josephine Igo, CRNA   Order list changed, Pharmacy for encounter modified

## 2021-10-05 NOTE — TOC Transition Note (Signed)
Transition of Care (TOC) - CM/SW Discharge Note Marvetta Gibbons RN, BSN Transitions of Care Unit 4E- RN Case Manager See Treatment Team for direct phone #    Patient Details  Name: Timothy [name] MRN: [id number] Date of Birth: 1950/10/21  Transition of Care [facility]) CM/SW Contact:  Dawayne Patricia, RN Phone Number: 10/05/2021, 9:53 AM   Clinical Narrative:    Pt stable for transition home today. CM was Notified by [facility] -they are following patient with MD office protocol referral prearranged for [facility] needs. Lattie Haw with [facility] notified of discharge today and will contact pt to schedule [facility] visits.   No further TOC needs noted.  Pt to return home w/ family   Final next level of care: Home/Self Care Barriers to Discharge: No Barriers Identified   Patient Goals and CMS Choice     Choice offered to / list presented to : NA- Office referral protocol  Discharge Placement               Home        Discharge Plan and Services                DME Arranged: N/A DME Agency: NA       HH Arranged: Disease Management [place] Agency: [place] Date [place]: 10/05/21 [place]: [phone] Representative spoke with at [place]: [place] Determinants of Health ([place]) Interventions     Readmission Risk Interventions    10/05/2021    9:49 AM  Readmission Risk Prevention Plan  Post Dischage Appt Complete  Medication Screening Complete  Transportation Screening Complete

## 2021-10-05 NOTE — Progress Notes (Signed)
Removes sutures on the chest per order. D/c tele and Ivs. Went over AVS with pt and all questions were addressed.   Lavenia Atlas, RN

## 2021-10-05 NOTE — Plan of Care (Signed)
°  Problem: Clinical Measurements: °Goal: Respiratory complications will improve °Outcome: Progressing °Goal: Cardiovascular complication will be avoided °Outcome: Progressing °  °Problem: Activity: °Goal: Risk for activity intolerance will decrease °Outcome: Progressing °  °

## 2021-10-06 ENCOUNTER — Ambulatory Visit: Payer: Self-pay

## 2021-10-06 NOTE — Patient Outreach (Signed)
  Care Coordination TOC Note Transition Care Management Follow-up Telephone Call Date of discharge and from where: [facility] 09/27/21-10/05/21 How have you been since you were released from the hospital? "I feel great! I am a bit tired but doing very well". Any questions or concerns? No  Items Reviewed: Did the pt receive and understand the discharge instructions provided? Yes  Medications obtained and verified? Yes  Other? No  Any new allergies since your discharge? No  Dietary orders reviewed? Yes Do you have support at home? Yes   Home Care and Equipment/Supplies: Were home health services ordered? yes If so, what is the name of the agency? [facility]  Has the agency set up a time to come to the patient's home? no Were any new equipment or medical supplies ordered?  No What is the name of the medical supply agency? N/A Were you able to get the supplies/equipment? no Do you have any questions related to the use of the equipment or supplies? No  Functional Questionnaire: (I = Independent and D = Dependent) ADLs: I  Bathing/Dressing- I  Meal Prep- I  Eating- I  Maintaining continence- I  Transferring/Ambulation- I  Managing Meds- I  Follow up appointments reviewed:  PCP Hospital f/u appt confirmed? Yes  Scheduled to see Dr. Agustin Cree on 10/15/21 @ 8:00. [facility] f/u appt confirmed? Yes  Scheduled to see Dr. Darcey Nora on 11/09/21 @ 4:00. Are transportation arrangements needed? No  If their condition worsens, is the pt aware to call PCP or go to the Emergency Dept.? Yes Was the patient provided with contact information for the PCP's office or ED? Yes Was to pt encouraged to call back with questions or concerns? Yes  SDOH assessments and interventions completed:   Yes  Care Coordination Interventions Activated:  No   Care Coordination Interventions:   No interventions needed.     Encounter Outcome:  Pt. Visit Completed

## 2021-10-07 DIAGNOSIS — Z951 Presence of aortocoronary bypass graft: Secondary | ICD-10-CM | POA: Diagnosis not present

## 2021-10-07 DIAGNOSIS — Z7982 Long term (current) use of aspirin: Secondary | ICD-10-CM | POA: Diagnosis not present

## 2021-10-07 DIAGNOSIS — I249 Acute ischemic heart disease, unspecified: Secondary | ICD-10-CM | POA: Diagnosis not present

## 2021-10-07 DIAGNOSIS — I10 Essential (primary) hypertension: Secondary | ICD-10-CM | POA: Diagnosis not present

## 2021-10-07 DIAGNOSIS — E785 Hyperlipidemia, unspecified: Secondary | ICD-10-CM | POA: Diagnosis not present

## 2021-10-07 DIAGNOSIS — Z48812 Encounter for surgical aftercare following surgery on the circulatory system: Secondary | ICD-10-CM | POA: Diagnosis not present

## 2021-10-07 DIAGNOSIS — I214 Non-ST elevation (NSTEMI) myocardial infarction: Secondary | ICD-10-CM | POA: Diagnosis not present

## 2021-10-08 DIAGNOSIS — E785 Hyperlipidemia, unspecified: Secondary | ICD-10-CM | POA: Diagnosis not present

## 2021-10-08 DIAGNOSIS — I249 Acute ischemic heart disease, unspecified: Secondary | ICD-10-CM | POA: Diagnosis not present

## 2021-10-08 DIAGNOSIS — I10 Essential (primary) hypertension: Secondary | ICD-10-CM | POA: Diagnosis not present

## 2021-10-08 DIAGNOSIS — Z48812 Encounter for surgical aftercare following surgery on the circulatory system: Secondary | ICD-10-CM | POA: Diagnosis not present

## 2021-10-08 DIAGNOSIS — I214 Non-ST elevation (NSTEMI) myocardial infarction: Secondary | ICD-10-CM | POA: Diagnosis not present

## 2021-10-08 DIAGNOSIS — Z951 Presence of aortocoronary bypass graft: Secondary | ICD-10-CM | POA: Diagnosis not present

## 2021-10-15 ENCOUNTER — Ambulatory Visit (INDEPENDENT_AMBULATORY_CARE_PROVIDER_SITE_OTHER): Payer: Medicare Other | Admitting: Cardiology

## 2021-10-15 ENCOUNTER — Encounter: Payer: Self-pay | Admitting: Cardiology

## 2021-10-15 VITALS — BP 110/60 | HR 68 | Ht 70.0 in | Wt 145.0 lb

## 2021-10-15 DIAGNOSIS — Z951 Presence of aortocoronary bypass graft: Secondary | ICD-10-CM

## 2021-10-15 DIAGNOSIS — R0789 Other chest pain: Secondary | ICD-10-CM | POA: Diagnosis not present

## 2021-10-15 DIAGNOSIS — I10 Essential (primary) hypertension: Secondary | ICD-10-CM

## 2021-10-15 DIAGNOSIS — R7989 Other specified abnormal findings of blood chemistry: Secondary | ICD-10-CM | POA: Diagnosis not present

## 2021-10-15 MED ORDER — TRAMADOL HCL 50 MG PO TABS
ORAL_TABLET | ORAL | 0 refills | Status: DC
Start: 2021-10-15 — End: 2021-11-09

## 2021-10-15 MED ORDER — CEPHALEXIN 500 MG PO CAPS: ORAL_CAPSULE | ORAL | 0 refills | Status: DC

## 2021-10-15 NOTE — Patient Instructions (Signed)
Medication Instructions:  Your physician has recommended you make the following change in your medication:  Keflex '500mg'$  1 tablet every 6 hours for 10 days   Tramadol '50mg'$  1 tablet every 8 hours as needed    Lab Work: CBC- today If you have labs (blood work) drawn today and your tests are completely normal, you will receive your results only by: [place] (if you have MyChart) OR A paper copy in the mail If you have any lab test that is abnormal or we need to change your treatment, we will call you to review the results.   Testing/Procedures: None Ordered   Follow-Up: At [facility], you and your health needs are our priority.  As part of our continuing mission to provide you with exceptional heart care, we have created designated Provider Care Teams.  These Care Teams include your primary Cardiologist (physician) and Advanced Practice Providers (APPs -  Physician Assistants and Nurse Practitioners) who all work together to provide you with the care you need, when you need it.  We recommend signing up for the patient portal called "MyChart".  Sign up information is provided on this After Visit Summary.  MyChart is used to connect with patients for Virtual Visits (Telemedicine).  Patients are able to view lab/test results, encounter notes, upcoming appointments, etc.  Non-urgent messages can be sent to your provider as well.   To learn more about what you can do with MyChart, go to [email or web address].    Your next appointment:   3 month(s)  The format for your next appointment:   In Person  Provider:   [name], MD    Other Instructions Cardiac Rehab - referral and records to be faxed- they will call for appt

## 2021-10-15 NOTE — Progress Notes (Signed)
Cardiology Office Note:    Date:  10/15/2021   ID:  Timothy [name], DOB 12-May-1950, MRN [id number]  PCP:  Wendie Agreste, MD  Cardiologist:  [name], MD    Referring MD: Doreene Nest, *   Chief Complaint  Patient presents with   Hospitalization Follow-up    History of Present Illness:    Timothy [name] is a 71 y.o. male with past medical history significant for essential hypertension, dyslipidemia, few weeks ago he end up having coronary artery bypass graft which with 3 grafts with LIMA to LAD SVG to posterior descending and SVG to circumflex marginal.  That was after he suffered from non-STEMI, likely left ventricle ejection fraction was preserved.  Postsurgical course was complicated with anemia.  He required blood transfusion.  Otherwise he seems to be doing well and overall after his surgery he looks good.  Denies having any chest pain except for obvious pain after surgery no dizziness no passing out however wanting the right lower extremities after SVG was taking out look angry and red he described the fact that he did have some discharge from that wound.  Denies have any fever and chills  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   HYPERLIPIDEMIA 11/21/2007   Hypertension    Hypertriglyceridemia 08/15/2019   Left epiretinal membrane 07/19/2019   Need for influenza vaccination 12/22/2017   Need for  shingles vaccine 09/02/2018   NEPHROLITHIASIS, HX OF 01/17/2007   OSTEOARTHRITIS 01/17/2007   Osteoarthritis 01/17/2007   Qualifier: Diagnosis of  By: Burnice Logan  MD, Doretha Sou    Posterior vitreous detachment of left eye 07/19/2019   Right epiretinal membrane 07/19/2019   Steatosis of liver 08/24/2019   Subacromial impingement 01/16/2019    Past Surgical History:  Procedure Laterality Date   APPENDECTOMY     CARPAL TUNNEL RELEASE Right    CATARACT EXTRACTION Bilateral    june 2019   CORONARY ARTERY BYPASS GRAFT N/A 09/30/2021   Procedure: CORONARY ARTERY BYPASS GRAFTING (CABG) X 3 BYPASSES USING LEFT INTERNAL MAMMARY ARTERY AND RIGHT LEG ENDOHARVESTED SAPHENOUS VEIN GRAFT;  Surgeon: Dahlia Byes, MD;  Location: [facility];  Service: Open Heart Surgery;  Laterality: N/A;   HERNIA REPAIR     ingunial   JOINT REPLACEMENT     KNEE SURGERY Right    x 4   LEFT HEART CATH AND CORONARY ANGIOGRAPHY N/A 09/28/2021   Procedure: LEFT HEART CATH AND CORONARY ANGIOGRAPHY;  Surgeon: Lorretta Harp, MD;  Location: [facility];  Service: Cardiovascular;  Laterality: N/A;   MENISCUS REPAIR Left    knee; x 3   REPLACEMENT TOTAL KNEE Right    SHOULDER SURGERY     left   TEE WITHOUT CARDIOVERSION N/A 09/30/2021   Procedure: TRANSESOPHAGEAL ECHOCARDIOGRAM (TEE);  Surgeon: Dahlia Byes, MD;  Location: [facility];  Service: Open Heart Surgery;  Laterality: N/A;  TOTAL KNEE ARTHROPLASTY     right    Current Medications: Current Meds  Medication Sig   acetaminophen (TYLENOL) 325 MG tablet Take 2 tablets (650 mg total) by mouth every 6 (six) hours as needed for mild pain or moderate pain.   atorvastatin (LIPITOR) 80 MG tablet Take 1 tablet (80 mg total) by mouth daily.   ferrous [id number] C-folic acid (TRINSICON / FOLTRIN) capsule Take 1 capsule by mouth 2 (two) times daily after a meal.   meloxicam (MOBIC) 7.5 MG tablet Take 1 tablet (7.5 mg total) by mouth daily as needed for pain.    metoprolol tartrate (LOPRESSOR) 25 MG tablet Take 0.5 tablets (12.5 mg total) by mouth 2 (two) times daily.   Multiple Vitamin (MULTIVITAMIN) tablet Take 1 tablet by mouth daily. Unknown strength   pantoprazole (PROTONIX) 40 MG tablet TAKE 1 TABLET BY MOUTH EVERY DAY   pantoprazole (PROTONIX) 40 MG tablet Take 1 tablet (40 mg total) by mouth daily.   triamcinolone cream (KENALOG) 0.1 % Apply 1 Application topically daily as needed (For rash).   triamcinolone ointment (KENALOG) 0.1 % Apply 1 Application topically daily as needed (For rash).     Allergies:   Patient has no known allergies.   Social History   Socioeconomic History   Marital status: Married    Spouse name: Not on file   Number of children: Not on file   Years of education: Not on file   Highest education level: Not on file  Occupational History   Not on file  Tobacco Use   Smoking status: Never   Smokeless tobacco: Never  Vaping Use   Vaping Use: Never used  Substance and Sexual Activity   Alcohol use: Yes   Drug use: Never   Sexual activity: Not Currently  Other Topics Concern   Not on file  Social History Narrative   ** Merged History Encounter **       Social Determinants of Health   Financial Resource Strain: Low Risk  (01/27/2021)   Overall Financial Resource Strain (CARDIA)    Difficulty of Paying Living Expenses: Not hard at all  Food Insecurity: No Food Insecurity (01/27/2021)   Hunger Vital Sign    Worried About Running Out of Food in the Last Year: Never true    [place] in the Last Year: Never true  Transportation Needs: No Transportation Needs (01/27/2021)   PRAPARE - Hydrologist (Medical): No    Lack of Transportation (Non-Medical): No  Physical Activity: Sufficiently Active (01/27/2021)   Exercise Vital Sign    Days of Exercise per Week: 6 days    Minutes of Exercise per Session: 60 min  Stress: No Stress Concern Present (01/27/2021)   [place]    Feeling of Stress : Not at all  Social Connections: [place] (01/27/2021)   Social Connection and Isolation Panel [NHANES]    Frequency of Communication with Friends and Family: Twice a week    Frequency of Social Gatherings with Friends and Family: Twice a week    Attends Religious Services: More than 4 times per year    Active Member of [employer] or Organizations: Yes    Attends Archivist Meetings: 1 to 4 times per year    Marital Status: Married     Family History: The patient's family history includes Colon cancer (age of onset: 14) in his father. There is no  history of Esophageal cancer, Rectal cancer, Stomach cancer, Pancreatic cancer, or Liver disease. ROS:   Please see the history of present illness.    All 14 point review of systems negative except as described per history of present illness  EKGs/Labs/Other Studies Reviewed:      Recent Labs: 09/29/2021: ALT 57; TSH 1.440 10/01/2021: Magnesium 2.1 10/04/2021: Hemoglobin 8.0; Platelets 76 10/05/2021: BUN 7; Creatinine, Ser 0.87; Potassium 4.0; Sodium 135  Recent Lipid Panel    Component Value Date/Time   CHOL 146 09/28/2021 0235   CHOL 212 (H) 03/16/2021 0903   TRIG 132 09/28/2021 0235   HDL 40 (L) 09/28/2021 0235   HDL 64 03/16/2021 0903   CHOLHDL 3.7 09/28/2021 0235   VLDL 26 09/28/2021 0235   LDLCALC 80 09/28/2021 0235   LDLCALC 124 (H) 03/16/2021 0903   LDLCALC 61 08/22/2018 1109   LDLDIRECT 121.0 06/24/2021 0809    Physical Exam:    VS:  BP 110/60 (BP Location: Left Arm, Patient Position: Sitting, Cuff Size: Normal)   Pulse 68   Ht '5\' 10"'$  (1.778 m)   Wt 145 lb (65.8 kg)   SpO2 95%   BMI 20.81 kg/m     Wt Readings from Last 3 Encounters:  10/15/21 145 lb (65.8 kg)  10/04/21 151 lb 3.2 oz (68.6 kg)  06/24/21 145 lb (65.8 kg)     GEN:  Well nourished, well developed in no acute distress HEENT: Normal NECK: No JVD;  No carotid bruits LYMPHATICS: No lymphadenopathy CARDIAC: RRR, no murmurs, no rubs, no gallops RESPIRATORY:  Clear to auscultation without rales, wheezing or rhonchi  ABDOMEN: Soft, non-tender, non-distended MUSCULOSKELETAL:  No edema; No deformity  SKIN: Warm and dry LOWER EXTREMITIES: no swelling NEUROLOGIC:  Alert and oriented x 3 PSYCHIATRIC:  Normal affect   ASSESSMENT:    1. S/P CABG x 3   2. Atypical chest pain   3. Elevated LFTs   4. Primary hypertension    PLAN:    In order of problems listed above:  Status post coronary bypass graft.  Sternotomy wound looks clean.  His stab wounds after drain looking good as well there is still Steri-Strip present, wound however after SVG was taken out from right lower extremity look inflamed red there was some discharge but not present during my examination.  I am afraid he will require an antibiotic for it which I will give it to him and I told him if this will not get better with antibiotic until Monday he need to contact surgeons. Elevated LFTs I will check his liver function test, also will check CBC since he was anemic. We will hypertension blood pressure well controlled continue present management. Dyslipidemia: He is on high intense statin which I will continue.  We will check fasting lipid profile within next 3 weeks. I will give him prescription for Keflex 500 every 6 hours, I will also give him prescription for tramadol 50 mg every 8 hours as needed   Medication Adjustments/Labs and Tests Ordered: Current medicines are reviewed at length with the patient today.  Concerns regarding medicines are outlined above.  No orders of the defined types were placed in this encounter.  Medication changes: No orders of the defined types were placed in this encounter.   Signed, Park Liter, MD, [facility] 10/15/2021 8:44 AM    [place]

## 2021-10-16 LAB — CBC
Hematocrit: 31.4 % — ABNORMAL LOW (ref 37.5–51.0)
Hemoglobin: 10.8 g/dL — ABNORMAL LOW (ref 13.0–17.7)
MCH: 33.6 pg — ABNORMAL HIGH (ref 26.6–33.0)
MCHC: 34.4 g/dL (ref 31.5–35.7)
MCV: 98 fL — ABNORMAL HIGH (ref 79–97)
Platelets: 181 10*3/uL (ref 150–450)
RBC: 3.21 x10E6/uL — ABNORMAL LOW (ref 4.14–5.80)
RDW: 12.8 % (ref 11.6–15.4)
WBC: 5.1 10*3/uL (ref 3.4–10.8)

## 2021-10-20 DIAGNOSIS — I214 Non-ST elevation (NSTEMI) myocardial infarction: Secondary | ICD-10-CM | POA: Diagnosis not present

## 2021-10-20 DIAGNOSIS — E785 Hyperlipidemia, unspecified: Secondary | ICD-10-CM | POA: Diagnosis not present

## 2021-10-20 DIAGNOSIS — I10 Essential (primary) hypertension: Secondary | ICD-10-CM | POA: Diagnosis not present

## 2021-10-20 DIAGNOSIS — Z951 Presence of aortocoronary bypass graft: Secondary | ICD-10-CM | POA: Diagnosis not present

## 2021-10-20 DIAGNOSIS — I249 Acute ischemic heart disease, unspecified: Secondary | ICD-10-CM | POA: Diagnosis not present

## 2021-10-20 DIAGNOSIS — Z48812 Encounter for surgical aftercare following surgery on the circulatory system: Secondary | ICD-10-CM | POA: Diagnosis not present

## 2021-10-22 ENCOUNTER — Ambulatory Visit: Payer: Medicare Other | Admitting: Gastroenterology

## 2021-10-22 ENCOUNTER — Telehealth: Payer: Self-pay

## 2021-10-22 NOTE — Telephone Encounter (Signed)
I called the patient, he stated he was at dinner and would prefer not to discuss this at this time. He stated he will call us in the morning.

## 2021-10-22 NOTE — Telephone Encounter (Signed)
-----   Message from Park Liter, MD sent at 10/16/2021  8:55 AM EDT ----- Blood counts look much better.  His hemoglobin was 8 12 days ago now it is 10.8.  Lower limits of normal is 13.  Slowly getting there.  If he can take some iron that probably will speed up the process of improvement

## 2021-10-28 ENCOUNTER — Other Ambulatory Visit: Payer: Self-pay | Admitting: Internal Medicine

## 2021-11-06 ENCOUNTER — Other Ambulatory Visit: Payer: Self-pay | Admitting: Cardiothoracic Surgery

## 2021-11-06 DIAGNOSIS — Z951 Presence of aortocoronary bypass graft: Secondary | ICD-10-CM

## 2021-11-09 ENCOUNTER — Ambulatory Visit: Payer: Medicare Other | Admitting: Cardiothoracic Surgery

## 2021-11-09 ENCOUNTER — Other Ambulatory Visit: Payer: Self-pay

## 2021-11-09 ENCOUNTER — Encounter: Payer: Self-pay | Admitting: Cardiothoracic Surgery

## 2021-11-09 ENCOUNTER — Ambulatory Visit (INDEPENDENT_AMBULATORY_CARE_PROVIDER_SITE_OTHER): Payer: Self-pay | Admitting: Cardiothoracic Surgery

## 2021-11-09 ENCOUNTER — Ambulatory Visit
Admission: RE | Admit: 2021-11-09 | Discharge: 2021-11-09 | Disposition: A | Discharge status: Home / Self Care | Payer: Medicare Other | Source: Ambulatory Visit | Attending: Cardiothoracic Surgery | Admitting: Cardiothoracic Surgery

## 2021-11-09 VITALS — BP 138/73 | HR 82 | Resp 18 | Ht 70.0 in | Wt 145.0 lb

## 2021-11-09 DIAGNOSIS — Z09 Encounter for follow-up examination after completed treatment for conditions other than malignant neoplasm: Secondary | ICD-10-CM

## 2021-11-09 DIAGNOSIS — J9811 Atelectasis: Secondary | ICD-10-CM | POA: Diagnosis not present

## 2021-11-09 DIAGNOSIS — Z951 Presence of aortocoronary bypass graft: Secondary | ICD-10-CM | POA: Diagnosis not present

## 2021-11-09 DIAGNOSIS — J90 Pleural effusion, not elsewhere classified: Secondary | ICD-10-CM | POA: Diagnosis not present

## 2021-11-09 NOTE — Progress Notes (Signed)
Order placed for outpatient Cardiac Rehab per Dr. Prescott Gum.

## 2021-11-09 NOTE — Progress Notes (Signed)
HPI: Patient presents for his first postoperative follow-up 1 month after urgent CABG x3 following non-STEMI.  His pre and postoperative operative EF were normal. He has had no recurrent angina or symptoms of CHF.  Sternal incision is well-healed.  Right leg incision has some erythema and induration and was treated with a week of Keflex with improvement. He has been walking daily and is ready to start outpatient cardiac rehab, and ready to start driving and lifting more than 5 pounds.  Patient's blood pressure is starting to increase and he was on lisinopril 20 mg daily before surgery.  He takes his blood pressure at home and we will plan on resuming his preoperative dose in about 2 weeks as his blood pressure today was 138/78. Current Outpatient Medications  Medication Sig Dispense Refill   acetaminophen (TYLENOL) 325 MG tablet Take 2 tablets (650 mg total) by mouth every 6 (six) hours as needed for mild pain or moderate pain.     atorvastatin (LIPITOR) 80 MG tablet Take 1 tablet (80 mg total) by mouth daily. 30 tablet 2   ferrous [id number] C-folic acid (TRINSICON / FOLTRIN) capsule Take 1 capsule by mouth 2 (two) times daily after a meal. 60 capsule 0   metoprolol tartrate (LOPRESSOR) 25 MG tablet Take 0.5 tablets (12.5 mg total) by mouth 2 (two) times daily. 60 tablet 2   Multiple Vitamin (MULTIVITAMIN) tablet Take 1 tablet by mouth daily. Unknown strength     pantoprazole (PROTONIX) 40 MG tablet TAKE 1 TABLET BY MOUTH EVERY DAY 90 tablet 3   triamcinolone cream (KENALOG) 0.1 % Apply 1 Application topically daily as needed (For rash).     triamcinolone ointment (KENALOG) 0.1 % Apply 1 Application topically daily as needed (For rash).     meloxicam (MOBIC) 7.5 MG tablet Take 1 tablet (7.5 mg total) by mouth daily as needed for pain. (Patient not taking: Reported on 11/09/2021) 30 tablet 0   No current facility-administered medications for this visit.     Physical Exam: Blood  pressure 138/73, pulse 82, resp. rate 18, height '5\' 10"'$  (1.778 m), weight 145 lb (65.8 kg), SpO2 97 %.         Exam    General- alert and comfortable.  Chest incision and leg incision are both healing well.    Neck- no JVD, no cervical adenopathy palpable, no carotid bruit   Lungs- clear without rales, wheezes   Cor- regular rate and rhythm, no murmur , gallop   Abdomen- soft, non-tender   Extremities - warm, non-tender, minimal edema   Neuro- oriented, appropriate, no focal weakness   Diagnostic Tests: Today's chest x-ray personally reviewed showing clear lung fields, normal cardiac silhouette and sternal wires intact.  No pleural effusions.  Impression: Excellent early recovery after urgent multivessel CABG. Patient will continue his current medications and add his preoperative dose of lisinopril 20 mg in mid September. He may now drive and lift up to 20 pounds maximum. He is ready for cardiac rehab and we will help facilitate referral to the [facility] rehab program. I will see the patient back in about 8 weeks for final postop visit.  Plan: Return for final postop visit 3 months postop   Dahlia Byes, MD [facility] [phone]

## 2021-11-13 ENCOUNTER — Encounter: Payer: Self-pay | Admitting: Cardiothoracic Surgery

## 2021-11-18 ENCOUNTER — Other Ambulatory Visit: Payer: Self-pay | Admitting: Surgical

## 2021-11-23 ENCOUNTER — Encounter: Payer: Self-pay | Admitting: Cardiology

## 2021-11-25 ENCOUNTER — Telehealth: Payer: Self-pay

## 2021-11-25 ENCOUNTER — Encounter: Payer: Self-pay | Admitting: Cardiothoracic Surgery

## 2021-12-09 ENCOUNTER — Telehealth (HOSPITAL_COMMUNITY): Payer: Self-pay

## 2021-12-09 NOTE — Telephone Encounter (Signed)
Called patient to see if he was interested in participating in the Cardiac Rehab Program. Patient stated yes. Patient will come in for orientation on 12/17/21 @ 8AM and will attend the 10:15AM exercise class.   Tourist information centre manager.

## 2021-12-14 ENCOUNTER — Telehealth (HOSPITAL_COMMUNITY): Payer: Self-pay

## 2021-12-17 ENCOUNTER — Encounter (HOSPITAL_COMMUNITY): Payer: Self-pay

## 2021-12-17 ENCOUNTER — Encounter (HOSPITAL_COMMUNITY)
Admission: RE | Admit: 2021-12-17 | Discharge: 2021-12-17 | Disposition: A | Discharge status: Home / Self Care | Payer: Medicare Other | Source: Ambulatory Visit | Attending: Cardiology | Admitting: Cardiology

## 2021-12-17 VITALS — BP 110/74 | HR 80 | Ht 69.5 in | Wt 143.1 lb

## 2021-12-17 DIAGNOSIS — I214 Non-ST elevation (NSTEMI) myocardial infarction: Secondary | ICD-10-CM | POA: Insufficient documentation

## 2021-12-17 DIAGNOSIS — Z951 Presence of aortocoronary bypass graft: Secondary | ICD-10-CM | POA: Insufficient documentation

## 2021-12-17 HISTORY — DX: Atherosclerotic heart disease of native coronary artery without angina pectoris: I25.10

## 2021-12-17 NOTE — Progress Notes (Addendum)
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 Unknown strength, Disp: , Rfl:    triamcinolone cream (KENALOG) 0.1 %, Apply 1 Application topically daily as needed (For rash)., Disp: , Rfl:    triamcinolone ointment (KENALOG) 0.1 %, Apply 1 Application topically daily as needed (For rash)., Disp: , Rfl:    meloxicam (MOBIC) 7.5 MG tablet, Take 1 tablet (7.5 mg total) by mouth daily as needed for pain. (Patient not taking: Reported on 11/09/2021), Disp: 30 tablet, Rfl: 0   pantoprazole (PROTONIX) 40 MG tablet, TAKE 1 TABLET BY MOUTH EVERY DAY (Patient not taking: Reported on 12/17/2021), Disp: 90 tablet, Rfl: 3  Past Medical History: Past Medical History:  Diagnosis Date   Atypical chest pain 07/13/2019   Bilateral hearing loss 08/24/2019   Cataract    Cataract    Chronic kidney disease    kidney stones   Chronic pain of left knee 11/28/2019   COLONIC POLYPS, HX OF 02/12/2009    Coronary artery disease    COVID-19 01/2020   Dyslipidemia 11/21/2007   Qualifier: Diagnosis of  By: Burnice Logan  MD, Doretha Sou    Elevated LFTs 09/02/2018   Essential hypertension 04/07/2016   Treatment initiated February 2018   Follow-up examination after eye surgery 07/19/2019   Healthcare maintenance 08/22/2018   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/09/2017   HYPERLIPIDEMIA 11/21/2007   Hypertension    Hypertriglyceridemia 08/15/2019   Left epiretinal membrane 07/19/2019   Need for influenza vaccination 12/22/2017   Need for shingles vaccine 09/02/2018   NEPHROLITHIASIS, HX OF 01/17/2007   OSTEOARTHRITIS 01/17/2007   Osteoarthritis 01/17/2007   Qualifier: Diagnosis of  By: Burnice Logan  MD, Doretha Sou    Posterior vitreous detachment of left eye 07/19/2019   Right epiretinal membrane 07/19/2019   Steatosis of liver 08/24/2019   Subacromial impingement 01/16/2019    Tobacco Use: Social History   Tobacco Use  Smoking Status Never  Smokeless Tobacco Never    Labs: Review Flowsheet  More data exists      Latest Ref Rng & Units 03/16/2021 06/24/2021 09/28/2021 09/29/2021 09/30/2021  Labs for ITP Cardiac and Pulmonary Rehab  Cholestrol 0 - 200 mg/dL 212  194  146  - -  LDL (calc) 0 - 99 mg/dL 124  - 80  - -  Direct LDL mg/dL - 121.0  - - -  HDL-C >40 mg/dL 64  42.60  40  - -  Trlycerides <150  mg/dL 137  219.0  132  - -  Hemoglobin A1c 4.8 - 5.6 % - - - 4.7  -  PH, Arterial 7.35 - 7.45 - - - 7.42  7.320  7.327  7.455  7.420  7.415  7.404  7.386   PCO2 arterial 32 - 48 mmHg - - - 33  42.9  42.3  34.6  38.2  39.9  41.2  40.0   Bicarbonate 20.0 - 28.0 mmol/L - - - 21.4  22.3  22.4  24.6  24.8  25.6  31.2  25.7  24.0   TCO2 22 - 32 mmol/L - - - - '24  24  26  26  25  25  27  25  '$ 33  '27  24  24  25   '$ Acid-base deficit 0.0 - 2.0 mmol/L - - - 2.3  4.0  4.0  1.0   O2 Saturation % - - - 99.2  99  99  100  100  100  77  100  100     Capillary Blood Glucose: Lab Results  Component Value  Date   GLUCAP 116 (H) 10/03/2021   GLUCAP 103 (H) 10/02/2021   GLUCAP 124 (H) 10/02/2021   GLUCAP 98 10/02/2021   GLUCAP 105 (H) 10/02/2021     Exercise Target Goals: Exercise Program Goal: Individual exercise prescription set using results from initial 6 min walk test and THRR while considering  patient's activity barriers and safety.   Exercise Prescription Goal: Initial exercise prescription builds to 30-45 minutes a day of aerobic activity, 2-3 days per week.  Home exercise guidelines will be given to patient during program as part of exercise prescription that the participant will acknowledge.  Activity Barriers & Risk Stratification:  Activity Barriers & Cardiac Risk Stratification - 12/17/21 0928       Activity Barriers & Cardiac Risk Stratification   Activity Barriers Right Knee Replacement;Joint Problems    Cardiac Risk Stratification Moderate             6 Minute Walk:  6 Minute Walk     Row Name 12/17/21 0851         6 Minute Walk   Phase Initial     Distance 1932 feet     Walk Time 6 minutes     # of Rest Breaks 0     MPH 3.66     METS 4.7     RPE 7     Perceived Dyspnea  0     VO2 Peak 16.44     Symptoms No     Resting HR 82 bpm     Resting BP 110/74     Resting Oxygen Saturation  99 %     Exercise Oxygen Saturation  during 6 min walk 100 %     Max Ex. HR 112 bpm     Max Ex. BP 152/64     2 Minute Post BP 130/68              Oxygen Initial Assessment:   Oxygen Re-Evaluation:   Oxygen Discharge (Final Oxygen Re-Evaluation):   Initial Exercise Prescription:  Initial Exercise Prescription - 12/17/21 0900       Date of Initial Exercise RX and Referring Provider   Date 12/17/21    Referring Provider [name], MD    Expected Discharge Date 02/12/22      Recumbant Bike   Level 2.5    RPM 60    Watts  55    Minutes 15    METs 4.7      Arm Ergometer   Level 2    RPM 60    Minutes 15    METs 4.7      Prescription  Details   Frequency (times per week) 3    Duration Progress to 30 minutes of continuous aerobic without signs/symptoms of physical distress      Intensity   THRR 40-80% of Max Heartrate 60-119    Ratings of Perceived Exertion 11-13    Perceived Dyspnea 0-4      Progression   Progression Continue progressive overload as per policy without signs/symptoms or physical distress.      Resistance Training   Training Prescription Yes    Weight 4 lbs    Reps 10-15             Perform Capillary Blood Glucose checks as needed.  Exercise Prescription Changes:   Exercise Comments:   Exercise Goals and Review:   Exercise Goals     Row Name 12/17/21 0929             Exercise Goals   Increase Physical Activity Yes       Intervention Provide advice, education, support and counseling about physical activity/exercise needs.;Develop an individualized exercise prescription for aerobic and resistive training based on initial evaluation findings, risk stratification, comorbidities and participant's personal goals.       Expected Outcomes Short Term: Attend rehab on a regular basis to increase amount of physical activity.;Long Term: Add in home exercise to make exercise part of routine and to increase amount of physical activity.;Long Term: Exercising regularly at least 3-5 days a week.       Increase Strength and Stamina Yes       Intervention Provide advice, education, support and counseling about physical activity/exercise needs.;Develop an individualized exercise prescription for aerobic and resistive training based on initial evaluation findings, risk stratification, comorbidities and participant's personal goals.       Expected Outcomes Short Term: Increase workloads from initial exercise prescription for resistance, speed, and METs.;Short Term: Perform resistance training exercises routinely during rehab and add in resistance training at home;Long Term: Improve cardiorespiratory  fitness, muscular endurance and strength as measured by increased METs and functional capacity (6MWT)       Able to understand and use rate of perceived exertion (RPE) scale Yes       Intervention Provide education and explanation on how to use RPE scale       Expected Outcomes Short Term: Able to use RPE daily in rehab to express subjective intensity level;Long Term:  Able to use RPE to guide intensity level when exercising independently       Knowledge and understanding of Target Heart Rate Range (THRR) Yes       Intervention Provide education and explanation of THRR including how the numbers were predicted and where they are located for reference       Expected Outcomes Short Term: Able to state/look up THRR;Short Term: Able to use daily as guideline for intensity in rehab;Long Term: Able to use THRR to govern intensity when exercising independently       Understanding of Exercise Prescription Yes       Intervention Provide education, explanation, and written materials on patient's individual exercise prescription       Expected Outcomes Short Term: Able to explain program exercise prescription;Long Term: Able to explain home exercise prescription to exercise independently  Exercise Goals Re-Evaluation :   Discharge Exercise Prescription (Final Exercise Prescription Changes):   Nutrition:  Target Goals: Understanding of nutrition guidelines, daily intake of sodium '1500mg'$ , cholesterol '200mg'$ , calories 30% from fat and 7% or less from saturated fats, daily to have 5 or more servings of fruits and vegetables.  Biometrics:  Pre Biometrics - 12/17/21 0815       Pre Biometrics   Waist Circumference 31 inches    Hip Circumference 36.75 inches    Waist to Hip Ratio 0.84 %    Triceps Skinfold 7 mm    % Body Fat 17.8 %    Grip Strength 49 kg    Flexibility 14 in    Single Leg Stand 20.18 seconds              Nutrition Therapy Plan and Nutrition  Goals:   Nutrition Assessments:  MEDIFICTS Score Key: ?70 Need to make dietary changes  40-70 Heart Healthy Diet ? 40 Therapeutic Level Cholesterol Diet    Picture Your Plate Scores: <35 Unhealthy dietary pattern with much room for improvement. 41-50 Dietary pattern unlikely to meet recommendations for good health and room for improvement. 51-60 More healthful dietary pattern, with some room for improvement.  >60 Healthy dietary pattern, although there may be some specific behaviors that could be improved.    Nutrition Goals Re-Evaluation:   Nutrition Goals Re-Evaluation:   Nutrition Goals Discharge (Final Nutrition Goals Re-Evaluation):   Psychosocial: Target Goals: Acknowledge presence or absence of significant depression and/or stress, maximize coping skills, provide positive support system. Participant is able to verbalize types and ability to use techniques and skills needed for reducing stress and depression.  Initial Review & Psychosocial Screening:  Initial Psych Review & Screening - 12/17/21 0848       Initial Review   Current issues with None Identified      Family Dynamics   Good Support System? Yes   Eduard Clos has his wife and two children for support     Barriers   Psychosocial barriers to participate in program There are no identifiable barriers or psychosocial needs.      Screening Interventions   Interventions Encouraged to exercise             Quality of Life Scores:  Quality of Life - 12/17/21 0953       Quality of Life   Select Quality of Life      Quality of Life Scores   Health/Function Pre 28.7 %    Socioeconomic Pre 28.63 %    Psych/Spiritual Pre 29.29 %    Family Pre 30 %    GLOBAL Pre 28.99 %            Scores of 19 and below usually indicate a poorer quality of life in these areas.  A difference of  2-3 points is a clinically meaningful difference.  A difference of 2-3 points in the total score of the Quality of Life Index  has been associated with significant improvement in overall quality of life, self-image, physical symptoms, and general health in studies assessing change in quality of life.  PHQ-9: Review Flowsheet  More data exists      12/17/2021 06/24/2021 05/13/2021 01/27/2021 10/16/2020  Depression screen PHQ 2/9  Decreased Interest 0 0 0 0 0 0  Down, Depressed, Hopeless 0 0 0 0 0 0  PHQ - 2 Score 0 0 0 0 0 0  Altered sleeping - 0 0 - -  Tired, decreased  energy - 0 0 - -  Change in appetite - 0 0 - -  Feeling bad or failure about yourself  - 0 0 - -  Trouble concentrating - 0 0 - -  Moving slowly or fidgety/restless - 0 0 - -  Suicidal thoughts - 0 0 - -  PHQ-9 Score - 0 0 - -  Difficult doing work/chores - Not difficult at all - - -   Interpretation of Total Score  Total Score Depression Severity:  1-4 = Minimal depression, 5-9 = Mild depression, 10-14 = Moderate depression, 15-19 = Moderately severe depression, 20-27 = Severe depression   Psychosocial Evaluation and Intervention:   Psychosocial Re-Evaluation:   Psychosocial Discharge (Final Psychosocial Re-Evaluation):   Vocational Rehabilitation: Provide vocational rehab assistance to qualifying candidates.   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 Participant will state understanding/return demonstration of topics presented.     Core Videos: Exercise    Move It!  Clinical staff conducted group or individual video education with verbal and written material and guidebook.  Patient learns the recommended Pritikin exercise program. Exercise with the goal of  living a long, healthy life. Some of the health benefits of exercise include controlled diabetes, healthier blood pressure levels, improved cholesterol levels, improved heart and lung capacity, improved sleep, and better body composition. Everyone should speak with their doctor before starting or changing an exercise routine.  Biomechanical Limitations Clinical staff conducted group or individual video education with verbal and written material and guidebook.  Patient learns how biomechanical limitations can impact exercise and how we can mitigate and possibly overcome limitations to have an impactful and balanced exercise routine.  Body Composition Clinical staff conducted group or individual video education with verbal and written material and guidebook.  Patient learns that body composition (ratio of muscle mass to fat mass) is a key component to assessing overall fitness, rather than body weight alone. Increased fat mass, especially visceral belly fat, can put [place] at increased risk for metabolic syndrome, type 2 diabetes, heart disease, and even death. It is recommended to combine diet and exercise (cardiovascular and resistance training) to improve your body composition. Seek guidance from your physician and exercise physiologist before implementing an exercise routine.  Exercise Action Plan Clinical staff conducted group or individual video education with verbal and written material and guidebook.  Patient learns the recommended strategies to achieve and enjoy long-term exercise adherence, including variety, self-motivation, self-efficacy, and positive decision making. Benefits of exercise include fitness, good health, weight management, more energy, better sleep, less stress, and overall well-being.  Medical   Heart Disease Risk Reduction Clinical staff conducted group or individual video education with verbal and written material and guidebook.  Patient learns our heart is our most vital  organ as it circulates oxygen, nutrients, white blood cells, and hormones throughout the entire body, and carries waste away. Data supports a plant-based eating plan like the Pritikin Program for its effectiveness in slowing progression of and reversing heart disease. The video provides a number of recommendations to address heart disease.   Metabolic Syndrome and Belly  Fat  Clinical staff conducted group or individual video education with verbal and written material and guidebook.  Patient learns what metabolic syndrome is, how it leads to heart disease, and how one can reverse it and keep it from coming back. You have metabolic syndrome if you have 3 of the following 5 criteria: abdominal obesity, high blood pressure, high triglycerides, low HDL cholesterol, and high blood sugar.  Hypertension and Heart Disease Clinical staff conducted group or individual video education with verbal and written material and guidebook.  Patient learns that high blood pressure, or hypertension, is very common in the [place]. Hypertension is largely due to excessive salt intake, but other important risk factors include being overweight, physical inactivity, drinking too much alcohol, smoking, and not eating enough potassium from fruits and vegetables. High blood pressure is a leading risk factor for heart attack, stroke, congestive heart failure, dementia, kidney failure, and premature death. Long-term effects of excessive salt intake include stiffening of the arteries and thickening of heart muscle and organ damage. Recommendations include ways to reduce hypertension and the risk of heart disease.  Diseases of Our Time - Focusing on Diabetes Clinical staff conducted group or individual video education with verbal and written material and guidebook.  Patient learns why the best way to stop diseases of our time is prevention, through food and other lifestyle changes. Medicine (such as prescription pills and  surgeries) is often only a Band-Aid on the problem, not a long-term solution. Most common diseases of our time include obesity, type 2 diabetes, hypertension, heart disease, and cancer. The Pritikin Program is recommended and has been proven to help reduce, reverse, and/or prevent the damaging effects of metabolic syndrome.  Nutrition   Overview of the Pritikin Eating Plan  Clinical staff conducted group or individual video education with verbal and written material and guidebook.  Patient learns about the [place] for disease risk reduction. The [place] emphasizes a wide variety of unrefined, minimally-processed carbohydrates, like fruits, vegetables, whole grains, and legumes. Go, Caution, and Stop food choices are explained. Plant-based and lean animal proteins are emphasized. Rationale provided for low sodium intake for blood pressure control, low added sugars for blood sugar stabilization, and low added fats and oils for coronary artery disease risk reduction and weight management.  Calorie Density  Clinical staff conducted group or individual video education with verbal and written material and guidebook.  Patient learns about calorie density and how it impacts the Pritikin Eating Plan. Knowing the characteristics of the food you choose will help you decide whether those foods will lead to weight gain or weight loss, and whether you want to consume more or less of them. Weight loss is usually a side effect of the Pritikin Eating Plan because of its focus on low calorie-dense foods.  Label Reading  Clinical staff conducted group or individual video education with verbal and written material and guidebook.  Patient learns about the Pritikin recommended label reading guidelines and corresponding recommendations regarding calorie density, added sugars, sodium content, and whole grains.  Dining Out - Part 1  Clinical staff conducted group or individual video education with  verbal and written material and guidebook.  Patient learns that restaurant meals can be sabotaging because they can be so high in calories, fat, sodium, and/or sugar. Patient learns recommended strategies on how to positively address this and avoid unhealthy pitfalls.  Facts on Fats  Clinical staff conducted group or individual video education with verbal and written material  and guidebook.  Patient learns that lifestyle modifications can be just as effective, if not more so, as many medications for lowering your risk of heart disease. A Pritikin lifestyle can help to reduce your risk of inflammation and atherosclerosis (cholesterol build-up, or plaque, in the artery walls). Lifestyle interventions such as dietary choices and physical activity address the cause of atherosclerosis. A review of the types of fats and their impact on blood cholesterol levels, along with dietary recommendations to reduce fat intake is also included.  Nutrition Action Plan  Clinical staff conducted group or individual video education with verbal and written material and guidebook.  Patient learns how to incorporate Pritikin recommendations into their lifestyle. Recommendations include planning and keeping personal health goals in mind as an important part of their success.  Healthy Mind-Set    Healthy Minds, Bodies, Hearts  Clinical staff conducted group or individual video education with verbal and written material and guidebook.  Patient learns how to identify when they are stressed. Video will discuss the impact of that stress, as well as the many benefits of stress management. Patient will also be introduced to stress management techniques. The way we think, act, and feel has an impact on our hearts.  How Our Thoughts Can Heal Our Hearts  Clinical staff conducted group or individual video education with verbal and written material and guidebook.  Patient learns that negative thoughts can cause depression and anxiety.  This can result in negative lifestyle behavior and serious health problems. Cognitive behavioral therapy is an effective method to help control our thoughts in order to change and improve our emotional outlook.  Additional Videos:  Exercise    Improving Performance  Clinical staff conducted group or individual video education with verbal and written material and guidebook.  Patient learns to use a non-linear approach by alternating intensity levels and lengths of time spent exercising to help burn more calories and lose more body fat. Cardiovascular exercise helps improve heart health, metabolism, hormonal balance, blood sugar control, and recovery from fatigue. Resistance training improves strength, endurance, balance, coordination, reaction time, metabolism, and muscle mass. Flexibility exercise improves circulation, posture, and balance. Seek guidance from your physician and exercise physiologist before implementing an exercise routine and learn your capabilities and proper form for all exercise.  Introduction to Yoga  Clinical staff conducted group or individual video education with verbal and written material and guidebook.  Patient learns about yoga, a discipline of the coming together of mind, breath, and body. The benefits of yoga include improved flexibility, improved range of motion, better posture and core strength, increased lung function, weight loss, and positive self-image. Yoga's heart health benefits include lowered blood pressure, healthier heart rate, decreased cholesterol and triglyceride levels, improved immune function, and reduced stress. Seek guidance from your physician and exercise physiologist before implementing an exercise routine and learn your capabilities and proper form for all exercise.  Medical   Aging: Enhancing Your Quality of Life  Clinical staff conducted group or individual video education with verbal and written material and guidebook.  Patient learns key  strategies and recommendations to stay in good physical health and enhance quality of life, such as prevention strategies, having an advocate, securing a [place], and keeping a list of medications and system for tracking them. It also discusses how to avoid risk for bone loss.  Biology of Weight Control  Clinical staff conducted group or individual video education with verbal and written material and guidebook.  Patient learns that weight gain occurs because we consume more calories than we burn (eating more, moving less). Even if your body weight is normal, you may have higher ratios of fat compared to muscle mass. Too much body fat puts you at increased risk for cardiovascular disease, heart attack, stroke, type 2 diabetes, and obesity-related cancers. In addition to exercise, following the [place] can help reduce your risk.  Decoding Lab Results  Clinical staff conducted group or individual video education with verbal and written material and guidebook.  Patient learns that lab test reflects one measurement whose values change over time and are influenced by many factors, including medication, stress, sleep, exercise, food, hydration, pre-existing medical conditions, and more. It is recommended to use the knowledge from this video to become more involved with your lab results and evaluate your numbers to speak with your doctor.   Diseases of Our Time - Overview  Clinical staff conducted group or individual video education with verbal and written material and guidebook.  Patient learns that according to the CDC, 50% to 70% of chronic diseases (such as obesity, type 2 diabetes, elevated lipids, hypertension, and heart disease) are avoidable through lifestyle improvements including healthier food choices, listening to satiety cues, and increased physical activity.  Sleep Disorders Clinical staff conducted group or individual video education with verbal and  written material and guidebook.  Patient learns how good quality and duration of sleep are important to overall health and well-being. Patient also learns about sleep disorders and how they impact health along with recommendations to address them, including discussing with a physician.  Nutrition  Dining Out - Part 2 Clinical staff conducted group or individual video education with verbal and written material and guidebook.  Patient learns how to plan ahead and communicate in order to maximize their dining experience in a healthy and nutritious manner. Included are recommended food choices based on the type of restaurant the patient is visiting.   Fueling a Best boy conducted group or individual video education with verbal and written material and guidebook.  There is a strong connection between our food choices and our health. Diseases like obesity and type 2 diabetes are very prevalent and are in large-part due to lifestyle choices. The Pritikin Eating Plan provides plenty of food and hunger-curbing satisfaction. It is easy to follow, affordable, and helps reduce health risks.  Menu Workshop  Clinical staff conducted group or individual video education with verbal and written material and guidebook.  Patient learns that restaurant meals can sabotage health goals because they are often packed with calories, fat, sodium, and sugar. Recommendations include strategies to plan ahead and to communicate with the manager, chef, or server to help order a healthier meal.  Planning Your Eating Strategy  Clinical staff conducted group or individual video education with verbal and written material and guidebook.  Patient learns about the [place] and its benefit of reducing the risk of disease. The [place] does not focus on calories. Instead, it emphasizes high-quality, nutrient-rich foods. By knowing the characteristics of the foods, we choose, we can determine  their calorie density and make informed decisions.  Targeting Your Nutrition Priorities  Clinical staff conducted group or individual video education with verbal and written material and guidebook.  Patient learns that lifestyle habits have a tremendous impact on disease risk and progression. This video provides eating and physical activity recommendations based on your personal health goals, such as reducing LDL cholesterol, losing  weight, preventing or controlling type 2 diabetes, and reducing high blood pressure.  Vitamins and Minerals  Clinical staff conducted group or individual video education with verbal and written material and guidebook.  Patient learns different ways to obtain key vitamins and minerals, including through a recommended healthy diet. It is important to discuss all supplements you take with your doctor.   Healthy Mind-Set    Smoking Cessation  Clinical staff conducted group or individual video education with verbal and written material and guidebook.  Patient learns that cigarette smoking and tobacco addiction pose a serious health risk which affects millions of people. Stopping smoking will significantly reduce the risk of heart disease, lung disease, and many forms of cancer. Recommended strategies for quitting are covered, including working with your doctor to develop a successful plan.  Culinary   Becoming a Financial trader conducted group or individual video education with verbal and written material and guidebook.  Patient learns that cooking at home can be healthy, cost-effective, quick, and puts them in control. Keys to cooking healthy recipes will include looking at your recipe, assessing your equipment needs, planning ahead, making it simple, choosing cost-effective seasonal ingredients, and limiting the use of added fats, salts, and sugars.  Cooking - Breakfast and Snacks  Clinical staff conducted group or individual video education with verbal  and written material and guidebook.  Patient learns how important breakfast is to satiety and nutrition through the entire day. Recommendations include key foods to eat during breakfast to help stabilize blood sugar levels and to prevent overeating at meals later in the day. Planning ahead is also a key component.  Cooking - Human resources officer conducted group or individual video education with verbal and written material and guidebook.  Patient learns eating strategies to improve overall health, including an approach to cook more at home. Recommendations include thinking of animal protein as a side on your plate rather than center stage and focusing instead on lower calorie dense options like vegetables, fruits, whole grains, and plant-based proteins, such as beans. Making sauces in large quantities to freeze for later and leaving the skin on your vegetables are also recommended to maximize your experience.  Cooking - Healthy Salads and Dressing Clinical staff conducted group or individual video education with verbal and written material and guidebook.  Patient learns that vegetables, fruits, whole grains, and legumes are the foundations of the [place]. Recommendations include how to incorporate each of these in flavorful and healthy salads, and how to create homemade salad dressings. Proper handling of ingredients is also covered. Cooking - Soups and [employer] - Soups and Desserts Clinical staff conducted group or individual video education with verbal and written material and guidebook.  Patient learns that Pritikin soups and desserts make for easy, nutritious, and delicious snacks and meal components that are low in sodium, fat, sugar, and calorie density, while high in vitamins, minerals, and filling fiber. Recommendations include simple and healthy ideas for soups and desserts.   Overview     The Pritikin Solution Program Overview Clinical staff conducted  group or individual video education with verbal and written material and guidebook.  Patient learns that the results of the [place] Program have been documented in more than 100 articles published in peer-reviewed journals, and the benefits include reducing risk factors for (and, in some cases, even reversing) high cholesterol, high blood pressure, type 2 diabetes, obesity, and more! An overview of the three key pillars  of the Pritikin Program will be covered: eating well, doing regular exercise, and having a healthy mind-set.  WORKSHOPS  Exercise: Exercise Basics: Building Your Action Plan Clinical staff led group instruction and group discussion with PowerPoint presentation and patient guidebook. To enhance the learning environment the use of posters, models and videos may be added. At the conclusion of this workshop, patients will comprehend the difference between physical activity and exercise, as well as the benefits of incorporating both, into their routine. Patients will understand the FITT (Frequency, Intensity, Time, and Type) principle and how to use it to build an exercise action plan. In addition, safety concerns and other considerations for exercise and cardiac rehab will be addressed by the presenter. The purpose of this lesson is to promote a comprehensive and effective weekly exercise routine in order to improve patients' overall level of fitness.   Managing Heart Disease: Your Path to a Healthier Heart Clinical staff led group instruction and group discussion with PowerPoint presentation and patient guidebook. To enhance the learning environment the use of posters, models and videos may be added.At the conclusion of this workshop, patients will understand the anatomy and physiology of the heart. Additionally, they will understand how Pritikin's three pillars impact the risk factors, the progression, and the management of heart disease.  The purpose of this lesson is to provide a  high-level overview of the heart, heart disease, and how the Pritikin lifestyle positively impacts risk factors.  Exercise Biomechanics Clinical staff led group instruction and group discussion with PowerPoint presentation and patient guidebook. To enhance the learning environment the use of posters, models and videos may be added. Patients will learn how the structural parts of their bodies function and how these functions impact their daily activities, movement, and exercise. Patients will learn how to promote a neutral spine, learn how to manage pain, and identify ways to improve their physical movement in order to promote healthy living. The purpose of this lesson is to expose patients to common physical limitations that impact physical activity. Participants will learn practical ways to adapt and manage aches and pains, and to minimize their effect on regular exercise. Patients will learn how to maintain good posture while sitting, walking, and lifting.  Balance Training and Fall Prevention  Clinical staff led group instruction and group discussion with PowerPoint presentation and patient guidebook. To enhance the learning environment the use of posters, models and videos may be added. At the conclusion of this workshop, patients will understand the importance of their sensorimotor skills (vision, proprioception, and the vestibular system) in maintaining their ability to balance as they age. Patients will apply a variety of balancing exercises that are appropriate for their current level of function. Patients will understand the common causes for poor balance, possible solutions to these problems, and ways to modify their physical environment in order to minimize their fall risk. The purpose of this lesson is to teach patients about the importance of maintaining balance as they age and ways to minimize their risk of falling.  WORKSHOPS   Nutrition:  Fueling a Scientist, research (physical sciences)  led group instruction and group discussion with PowerPoint presentation and patient guidebook. To enhance the learning environment the use of posters, models and videos may be added. Patients will review the foundational principles of the [place] and understand what constitutes a serving size in each of the food groups. Patients will also learn Pritikin-friendly foods that are better choices when away from home and review make-ahead meal  and snack options. Calorie density will be reviewed and applied to three nutrition priorities: weight maintenance, weight loss, and weight gain. The purpose of this lesson is to reinforce (in a group setting) the key concepts around what patients are recommended to eat and how to apply these guidelines when away from home by planning and selecting Pritikin-friendly options. Patients will understand how calorie density may be adjusted for different weight management goals.  Mindful Eating  Clinical staff led group instruction and group discussion with PowerPoint presentation and patient guidebook. To enhance the learning environment the use of posters, models and videos may be added. Patients will briefly review the concepts of the [place] and the importance of low-calorie dense foods. The concept of mindful eating will be introduced as well as the importance of paying attention to internal hunger signals. Triggers for non-hunger eating and techniques for dealing with triggers will be explored. The purpose of this lesson is to provide patients with the opportunity to review the basic principles of the [place], discuss the value of eating mindfully and how to measure internal cues of hunger and fullness using the Hunger Scale. Patients will also discuss reasons for non-hunger eating and learn strategies to use for controlling emotional eating.  Targeting Your Nutrition Priorities Clinical staff led group instruction and group discussion  with PowerPoint presentation and patient guidebook. To enhance the learning environment the use of posters, models and videos may be added. Patients will learn how to determine their genetic susceptibility to disease by reviewing their family history. Patients will gain insight into the importance of diet as part of an overall healthy lifestyle in mitigating the impact of genetics and other environmental insults. The purpose of this lesson is to provide patients with the opportunity to assess their personal nutrition priorities by looking at their family history, their own health history and current risk factors. Patients will also be able to discuss ways of prioritizing and modifying the [place] for their highest risk areas  Menu  Clinical staff led group instruction and group discussion with PowerPoint presentation and patient guidebook. To enhance the learning environment the use of posters, models and videos may be added. Using menus brought in from [employer], or printed from [employer], patients will apply the [place] dining out guidelines that were presented in the [employer] video. Patients will also be able to practice these guidelines in a variety of provided scenarios. The purpose of this lesson is to provide patients with the opportunity to practice hands-on learning of the [place] with actual menus and practice scenarios.  Label Reading Clinical staff led group instruction and group discussion with PowerPoint presentation and patient guidebook. To enhance the learning environment the use of posters, models and videos may be added. Patients will review and discuss the Pritikin label reading guidelines presented in Pritikin's Label Reading Educational series video. Using fool labels brought in from local grocery stores and markets, patients will apply the label reading guidelines and determine if the packaged food meet the Pritikin  guidelines. The purpose of this lesson is to provide patients with the opportunity to review, discuss, and practice hands-on learning of the Pritikin Label Reading guidelines with actual packaged food labels. [place] Workshops are designed to teach patients ways to prepare quick, simple, and affordable recipes at home. The importance of nutrition's role in chronic disease risk reduction is reflected in its emphasis in the overall  Pritikin program. By learning how to prepare essential core Pritikin Eating Plan recipes, patients will increase control over what they eat; be able to customize the flavor of foods without the use of added salt, sugar, or fat; and improve the quality of the food they consume. By learning a set of core recipes which are easily assembled, quickly prepared, and affordable, patients are more likely to prepare more healthy foods at home. These workshops focus on convenient breakfasts, simple entres, side dishes, and desserts which can be prepared with minimal effort and are consistent with nutrition recommendations for cardiovascular risk reduction. Cooking [employer] are taught by a Engineer, materials (RD) who has been trained by the [employer]. The chef or RD has a clear understanding of the importance of minimizing - if not completely eliminating - added fat, sugar, and sodium in recipes. Throughout the series of [place] Workshop sessions, patients will learn about healthy ingredients and efficient methods of cooking to build confidence in their capability to prepare    Cooking School weekly topics:  Adding Flavor- Sodium-Free  Fast and Healthy Breakfasts  Powerhouse Plant-Based Proteins  Satisfying Salads and Dressings  Simple Sides and Sauces  International Cuisine-Spotlight on the [place] Zones  Delicious Desserts  Savory Soups  Efficiency Cooking - Meals in a Snap  Tasty Appetizers and  Snacks  Comforting Weekend Breakfasts  One-Pot Wonders   Fast Evening Meals  Easy [place] (Psychosocial): New Thoughts, New Behaviors Clinical staff led group instruction and group discussion with PowerPoint presentation and patient guidebook. To enhance the learning environment the use of posters, models and videos may be added. Patients will learn and practice techniques for developing effective health and lifestyle goals. Patients will be able to effectively apply the goal setting process learned to develop at least one new personal goal.  The purpose of this lesson is to expose patients to a new skill set of behavior modification techniques such as techniques setting SMART goals, overcoming barriers, and achieving new thoughts and new behaviors.  Managing Moods and Relationships Clinical staff led group instruction and group discussion with PowerPoint presentation and patient guidebook. To enhance the learning environment the use of posters, models and videos may be added. Patients will learn how emotional and chronic stress factors can impact their health and relationships. They will learn healthy ways to manage their moods and utilize positive coping mechanisms. In addition, ICR patients will learn ways to improve communication skills. The purpose of this lesson is to expose patients to ways of understanding how one's mood and health are intimately connected. Developing a healthy outlook can help build positive relationships and connections with others. Patients will understand the importance of utilizing effective communication skills that include actively listening and being heard. They will learn and understand the importance of the "4 Cs" and especially Connections in fostering of a Healthy Mind-Set.  Healthy Sleep for a Healthy Heart Clinical staff led group instruction and group discussion with PowerPoint presentation  and patient guidebook. To enhance the learning environment the use of posters, models and videos may be added. At the conclusion of this workshop, patients will be able to demonstrate knowledge of the importance of sleep to overall health, well-being, and quality of life. They will understand the symptoms of, and treatments for, common sleep disorders. Patients will also be able to identify daytime and nighttime behaviors which impact sleep, and they will be able  to apply these tools to help manage sleep-related challenges. The purpose of this lesson is to provide patients with a general overview of sleep and outline the importance of quality sleep. Patients will learn about a few of the most common sleep disorders. Patients will also be introduced to the concept of "sleep hygiene," and discover ways to self-manage certain sleeping problems through simple daily behavior changes. Finally, the workshop will motivate patients by clarifying the links between quality sleep and their goals of heart-healthy living.   Recognizing and Reducing Stress Clinical staff led group instruction and group discussion with PowerPoint presentation and patient guidebook. To enhance the learning environment the use of posters, models and videos may be added. At the conclusion of this workshop, patients will be able to understand the types of stress reactions, differentiate between acute and chronic stress, and recognize the impact that chronic stress has on their health. They will also be able to apply different coping mechanisms, such as reframing negative self-talk. Patients will have the opportunity to practice a variety of stress management techniques, such as deep abdominal breathing, progressive muscle relaxation, and/or guided imagery.  The purpose of this lesson is to educate patients on the role of stress in their lives and to provide healthy techniques for coping with it.  Learning Barriers/Preferences:  Learning  Barriers/Preferences - 12/17/21 0955       Learning Barriers/Preferences   Learning Barriers Sight;Hearing   wears reading glasses, wears bilateral hearing aids   Learning Preferences Written Material;Computer/Internet;Group Instruction;Skilled Demonstration;Individual Instruction             Education Topics:  Knowledge Questionnaire Score:  Knowledge Questionnaire Score - 12/17/21 0953       Knowledge Questionnaire Score   Pre Score 21/24             Core Components/Risk Factors/Patient Goals at Admission:  Personal Goals and Risk Factors at Admission - 12/17/21 0953       Core Components/Risk Factors/Patient Goals on Admission    Weight Management Weight Gain;Yes    Intervention Weight Management: Develop a combined nutrition and exercise program designed to reach desired caloric intake, while maintaining appropriate intake of nutrient and fiber, sodium and fats, and appropriate energy expenditure required for the weight goal.;Weight Management: Provide education and appropriate resources to help participant work on and attain dietary goals.    Admit Weight 143 lb (64.9 kg)    Goal Weight: Long Term 165 lb (74.8 kg)    Expected Outcomes Weight Gain: Understanding of general recommendations for a high calorie, high protein meal plan that promotes weight gain by distributing calorie intake throughout the day with the consumption for 4-5 meals, snacks, and/or supplements    Hypertension Yes    Intervention Provide education on lifestyle modifcations including regular physical activity/exercise, weight management, moderate sodium restriction and increased consumption of fresh fruit, vegetables, and low fat dairy, alcohol moderation, and smoking cessation.;Monitor prescription use compliance.    Expected Outcomes Short Term: Continued assessment and intervention until BP is < 140/78m HG in hypertensive participants. < 130/872mHG in hypertensive participants with diabetes,  heart failure or chronic kidney disease.;Long Term: Maintenance of blood pressure at goal levels.    Lipids Yes    Intervention Provide education and support for participant on nutrition & aerobic/resistive exercise along with prescribed medications to achieve LDL '70mg'$ , HDL >'40mg'$ .    Expected Outcomes Short Term: Participant states understanding of desired cholesterol values and is compliant with medications prescribed. Participant is following  exercise prescription and nutrition guidelines.;Long Term: Cholesterol controlled with medications as prescribed, with individualized exercise RX and with personalized nutrition plan. Value goals: LDL < '70mg'$ , HDL > 40 mg.             Core Components/Risk Factors/Patient Goals Review:    Core Components/Risk Factors/Patient Goals at Discharge (Final Review):    ITP Comments:  ITP Comments     Row Name 12/17/21 0845           ITP Comments Dr Fransico Him MD, Medical Director. Introduction to Pritikin Education Program/ Intensive Cardiac Rehab. Initial Orientation Packet Reviewed with the patient                Comments: Participant attended orientation for the cardiac rehabilitation program on  12/17/2021  to perform initial intake and exercise walk test. Patient introduced to the [place] education and orientation packet was reviewed. Completed 6-minute walk test, measurements, initial ITP, and exercise prescription. Vital signs stable. Telemetry-normal sinus rhythm, asymptomatic.Harrell Gave RN BSN   Service time was from [phone] to [phone].

## 2021-12-17 NOTE — Progress Notes (Signed)
Cardiac Rehab Medication Review by a Nurse  Does the patient  feel that his/her medications are working for him/her?  yes  Has the patient been experiencing any side effects to the medications prescribed?  yes  Does the patient measure his/her own blood pressure or blood glucose at home?  yes   Does the patient have any problems obtaining medications due to transportation or finances?   no  Understanding of regimen: excellent Understanding of indications: excellent Potential of compliance: excellent    Nurse  comments: Timothy [name] is taking his medications and has a good understanding of what his medications are for. [name] checks his blood pressure 2-3 times a day. Timothy [name] says that he did have low blood pressure when he was taking lisinopril and metoprolol. Blood pressures are improved now that lisinopril was discontinued. [name] checks his blood pressures 2-3 times a day    Harrell Gave RN 12/17/2021 8:54 AM

## 2021-12-21 ENCOUNTER — Encounter (HOSPITAL_COMMUNITY)
Admission: RE | Admit: 2021-12-21 | Discharge: 2021-12-21 | Disposition: A | Discharge status: Home / Self Care | Payer: Medicare Other | Source: Ambulatory Visit | Attending: Cardiology | Admitting: Cardiology

## 2021-12-21 DIAGNOSIS — I214 Non-ST elevation (NSTEMI) myocardial infarction: Secondary | ICD-10-CM | POA: Diagnosis not present

## 2021-12-21 DIAGNOSIS — Z951 Presence of aortocoronary bypass graft: Secondary | ICD-10-CM

## 2021-12-21 NOTE — Progress Notes (Signed)
Daily Session Note  Patient Details  Name: Timothy [name] MRN: [id number] Date of Birth: January 13, 1951 Referring Provider:   Flowsheet Row INTENSIVE CARDIAC REHAB ORIENT from 12/17/2021 in [place]  Referring Provider [name], MD       Encounter Date: 12/21/2021  Check In:  Session Check In - 12/21/21 1104       Check-In   Supervising physician immediately available to respond to emergencies [facility] - Physician supervision    Physician(s) Dr. Debara Pickett    Location [facility]    Staff Present Barnet Pall, RN, Milus Glazier, MS, ACSM-CEP, CCRP, Exercise Physiologist;Bailey Pearline Cables, MS, Exercise Physiologist;Johnny Starleen Blue, MS, Exercise Physiologist;Olinty Celesta Aver, MS, ACSM-CEP, Exercise Physiologist;Jetta Walker BS, ACSM-CEP, Exercise Physiologist    Virtual Visit No    Medication changes reported     No    Fall or balance concerns reported    No    Tobacco Cessation No Change    Current number of cigarettes/nicotine per day     0    Warm-up and Cool-down Performed as group-led instruction    Resistance Training Performed Yes    VAD Patient? No    PAD/SET Patient? No      Pain Assessment   Currently in Pain? No/denies    Pain Score 0-No pain    Multiple Pain Sites No             Capillary Blood Glucose: No results found for this or any previous visit (from the past 24 hour(s)).   Exercise Prescription Changes - 12/21/21 1024       Response to Exercise   Blood Pressure (Admit) 124/62    Blood Pressure (Exercise) 146/72    Blood Pressure (Exit) 120/62    Heart Rate (Admit) 99 bpm    Heart Rate (Exercise) 143 bpm    Heart Rate (Exit) 95 bpm    Rating of Perceived Exertion (Exercise) 9    Symptoms None    Comments Decreased WL due to elevated heart rate. Will continue to monitor and request THRR increase as needed.    Duration Continue with 30 min of aerobic exercise without signs/symptoms of physical  distress.    Intensity THRR unchanged      Progression   Progression Continue to progress workloads to maintain intensity without signs/symptoms of physical distress.    Average METs 2.9      Resistance Training   Training Prescription Yes    Weight 4 lbs    Reps 10-15    Time 10 Minutes      Interval Training   Interval Training No      NuStep   Level 3    SPM 83    Minutes 15    METs 2.8      Arm Ergometer   Level 2.5   Decreased from level 3.0 at 5 minutes due to elevated HR.   Minutes 15    METs 2.9             Social History   Tobacco Use  Smoking Status Never  Smokeless Tobacco Never    Goals Met:  Exercise tolerated well No report of concerns or symptoms today Strength training completed today  Goals Unmet:  Not Applicable  Comments: Timothy [name] started cardiac rehab today.  Pt tolerated light exercise without difficulty. VSS, patient telemetry-Sinus Rhythm , asymptomatic. Timothy [name] did exceed his target heart rate. Will continue to monitor. Medication list reconciled. Pt denies barriers to [employer]  compliance.  PSYCHOSOCIAL ASSESSMENT:  PHQ-0. Pt exhibits positive coping skills, hopeful outlook with supportive family. No psychosocial needs identified at this time, no psychosocial interventions necessary.    Pt enjoys hunting and fishing.   Pt oriented to exercise equipment and routine.    Understanding verbalized. Harrell Gave RN BSN    Dr. Fransico Him is Medical Director for Cardiac Rehab at [facility].

## 2021-12-22 ENCOUNTER — Ambulatory Visit (HOSPITAL_COMMUNITY): Payer: Medicare Other

## 2021-12-23 ENCOUNTER — Encounter (HOSPITAL_COMMUNITY)
Admission: RE | Admit: 2021-12-23 | Discharge: 2021-12-23 | Disposition: A | Discharge status: Home / Self Care | Payer: Medicare Other | Source: Ambulatory Visit | Attending: Cardiology | Admitting: Cardiology

## 2021-12-23 DIAGNOSIS — I214 Non-ST elevation (NSTEMI) myocardial infarction: Secondary | ICD-10-CM

## 2021-12-23 DIAGNOSIS — Z951 Presence of aortocoronary bypass graft: Secondary | ICD-10-CM

## 2021-12-25 ENCOUNTER — Encounter (HOSPITAL_COMMUNITY): Payer: Medicare Other

## 2021-12-28 ENCOUNTER — Encounter (HOSPITAL_COMMUNITY): Payer: Medicare Other

## 2021-12-30 ENCOUNTER — Encounter (HOSPITAL_COMMUNITY): Payer: Medicare Other

## 2021-12-31 ENCOUNTER — Other Ambulatory Visit: Payer: Self-pay | Admitting: Surgical

## 2022-01-01 ENCOUNTER — Encounter (HOSPITAL_COMMUNITY): Payer: Medicare Other

## 2022-01-04 ENCOUNTER — Encounter: Payer: Self-pay | Admitting: Cardiothoracic Surgery

## 2022-01-04 ENCOUNTER — Encounter (HOSPITAL_COMMUNITY)
Admission: RE | Admit: 2022-01-04 | Discharge: 2022-01-04 | Disposition: A | Discharge status: Home / Self Care | Payer: Medicare Other | Source: Ambulatory Visit | Attending: Cardiology | Admitting: Cardiology

## 2022-01-04 ENCOUNTER — Ambulatory Visit (INDEPENDENT_AMBULATORY_CARE_PROVIDER_SITE_OTHER): Payer: Self-pay | Admitting: Cardiothoracic Surgery

## 2022-01-04 VITALS — BP 149/80 | HR 98 | Resp 18 | Ht 69.0 in | Wt 143.0 lb

## 2022-01-04 DIAGNOSIS — I214 Non-ST elevation (NSTEMI) myocardial infarction: Secondary | ICD-10-CM

## 2022-01-04 DIAGNOSIS — Z951 Presence of aortocoronary bypass graft: Secondary | ICD-10-CM

## 2022-01-04 DIAGNOSIS — Z4889 Encounter for other specified surgical aftercare: Secondary | ICD-10-CM | POA: Insufficient documentation

## 2022-01-04 NOTE — Progress Notes (Signed)
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  Patient continues to progress well. He is actively participating in phase 2 cardiac rehab at [facility]. He is making progress in his exercise tolerance without symptoms of angina.  Surgical incisions are healing and his pain is very well controlled. The patient's blood pressure is well controlled on metoprolol 12.5 mg p.o. twice daily, he has not needed lisinopril.  Current Outpatient Medications  Medication Sig Dispense Refill   acetaminophen (TYLENOL) 325 MG tablet Take 2 tablets (650 mg total) by mouth every 6 (six) hours as needed for mild pain or moderate pain.     atorvastatin (LIPITOR) 80 MG tablet Take 1 tablet (80 mg total) by mouth daily. 30 tablet 2   meloxicam (MOBIC) 7.5 MG tablet Take 1 tablet (7.5 mg total) by mouth daily as needed for pain. 30 tablet 0   metoprolol tartrate (LOPRESSOR) 25 MG tablet Take 0.5 tablets (12.5 mg total) by mouth 2 (two) times daily. 60 tablet 2   Multiple Vitamin (MULTIVITAMIN) tablet Take 1 tablet by mouth daily. Unknown strength     pantoprazole (PROTONIX) 40 MG tablet TAKE 1 TABLET BY MOUTH EVERY DAY 90 tablet 3   triamcinolone cream (KENALOG) 0.1 % Apply 1 Application topically daily as needed (For rash).     triamcinolone ointment (KENALOG) 0.1 % Apply 1 Application topically daily as needed (For rash).     No current facility-administered medications for this visit.     Physical Exam: Blood pressure (!) 149/80, pulse 98, resp. rate 18, height '5\' 9"'$  (1.753 m), weight 143 lb (64.9 kg), SpO2 99 %.  Alert and comfortable Lungs clear Sternal incision well-healed Heart rhythm regular murmur gallop Leg incision well-healed, no pedal edema  Diagnostic Tests: No chest x-ray today  Impression: Patient doing very well after urgent CABG x3 for non-STEMI. He understands importance of heart healthy lifestyle including heart healthy  diet and 3040 minutes of walking 5 days a week. He will return to the care of his cardiologist Dr. Agustin Cree, continue his current meds and return here as needed  Plan: Return to care his cardiologist and primary caregiver.   Dahlia Byes, MD [facility] [phone]

## 2022-01-05 ENCOUNTER — Ambulatory Visit (INDEPENDENT_AMBULATORY_CARE_PROVIDER_SITE_OTHER): Payer: Medicare Other | Admitting: Family Medicine

## 2022-01-05 ENCOUNTER — Telehealth: Payer: Self-pay | Admitting: Lab

## 2022-01-05 DIAGNOSIS — Z23 Encounter for immunization: Secondary | ICD-10-CM | POA: Diagnosis not present

## 2022-01-05 NOTE — Progress Notes (Signed)
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  Patient's Home Medications on Admission:  Current Outpatient Medications:    acetaminophen (TYLENOL) 325 MG tablet, Take 2 tablets (650 mg total) by mouth every 6 (six) hours as needed for mild pain or moderate pain., Disp: , Rfl:    atorvastatin (LIPITOR) 80 MG tablet, Take 1 tablet (80 mg total) by mouth daily., Disp: 30 tablet, Rfl: 2   meloxicam (MOBIC) 7.5 MG tablet, Take 1 tablet (7.5 mg total) by mouth daily as needed for pain., Disp: 30 tablet, Rfl: 0   metoprolol tartrate (LOPRESSOR) 25 MG tablet, Take 0.5 tablets (12.5 mg total) by mouth 2 (two) times daily., Disp: 60 tablet, Rfl: 2   Multiple Vitamin (MULTIVITAMIN) tablet, Take 1 tablet by mouth daily. Unknown strength, Disp: , Rfl:    pantoprazole (PROTONIX) 40 MG tablet, TAKE 1 TABLET BY MOUTH EVERY DAY, Disp: 90 tablet, Rfl: 3   triamcinolone cream (KENALOG) 0.1 %, Apply 1 Application topically daily as needed (For rash)., Disp: , Rfl:    triamcinolone ointment (KENALOG) 0.1 %, Apply 1 Application topically daily as needed (For rash)., Disp: , Rfl:   Past Medical History: Past Medical History:  Diagnosis Date   Atypical chest pain 07/13/2019   Bilateral hearing loss 08/24/2019   Cataract    Cataract    Chronic kidney disease    kidney stones   Chronic pain of left knee 11/28/2019   COLONIC POLYPS, HX OF 02/12/2009   Coronary artery disease    COVID-19 01/2020   Dyslipidemia 11/21/2007   Qualifier: Diagnosis  of  By: Burnice Logan  MD, Doretha Sou    Elevated LFTs 09/02/2018   Essential hypertension 04/07/2016   Treatment initiated February 2018   Follow-up examination after eye surgery 07/19/2019   Healthcare maintenance 08/22/2018   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/09/2017   HYPERLIPIDEMIA 11/21/2007   Hypertension    Hypertriglyceridemia 08/15/2019   Left epiretinal membrane 07/19/2019   Need for influenza vaccination 12/22/2017   Need for shingles vaccine 09/02/2018   NEPHROLITHIASIS, HX OF 01/17/2007   OSTEOARTHRITIS 01/17/2007   Osteoarthritis 01/17/2007   Qualifier: Diagnosis of  By: Burnice Logan  MD, Doretha Sou    Posterior vitreous detachment of left eye 07/19/2019   Right epiretinal membrane 07/19/2019   Steatosis of liver 08/24/2019   Subacromial impingement 01/16/2019    Tobacco Use: Social History   Tobacco Use  Smoking Status Never  Smokeless Tobacco Never    Labs: Review Flowsheet  More data exists      Latest Ref Rng & Units 03/16/2021 06/24/2021 09/28/2021 09/29/2021 09/30/2021  Labs for ITP Cardiac and Pulmonary Rehab  Cholestrol 0 - 200 mg/dL 212  194  146  - -  LDL (calc) 0 - 99 mg/dL 124  - 80  - -  Direct LDL mg/dL - 121.0  - - -  HDL-C >40 mg/dL 64  42.60  40  - -  Trlycerides <150 mg/dL 137  219.0  132  - -  Hemoglobin A1c  4.8 - 5.6 % - - - 4.7  -  PH, Arterial 7.35 - 7.45 - - - 7.42  7.320  7.327  7.455  7.420  7.415  7.404  7.386   PCO2 arterial 32 - 48 mmHg - - - 33  42.9  42.3  34.6  38.2  39.9  41.2  40.0   Bicarbonate 20.0 - 28.0 mmol/L - - - 21.4  22.3  22.4  24.6  24.8  25.6  31.2  25.7  24.0   TCO2 22 - 32 mmol/L - - - - '24  24  26  26  25  25  27  25  '$ 33  '27  24  24  25   '$ Acid-base deficit 0.0 - 2.0 mmol/L - - - 2.3  4.0  4.0  1.0   O2 Saturation % - - - 99.2  99  99  100  100  100  77  100  100     Capillary Blood Glucose: Lab Results  Component Value Date   GLUCAP 116 (H) 10/03/2021   GLUCAP 103 (H) 10/02/2021   GLUCAP 124 (H) 10/02/2021    GLUCAP 98 10/02/2021   GLUCAP 105 (H) 10/02/2021     Exercise Target Goals: Exercise Program Goal: Individual exercise prescription set using results from initial 6 min walk test and THRR while considering  patient's activity barriers and safety.   Exercise Prescription Goal: Initial exercise prescription builds to 30-45 minutes a day of aerobic activity, 2-3 days per week.  Home exercise guidelines will be given to patient during program as part of exercise prescription that the participant will acknowledge.  Activity Barriers & Risk Stratification:  Activity Barriers & Cardiac Risk Stratification - 12/17/21 0928       Activity Barriers & Cardiac Risk Stratification   Activity Barriers Right Knee Replacement;Joint Problems    Cardiac Risk Stratification Moderate             6 Minute Walk:  6 Minute Walk     Row Name 12/17/21 0851         6 Minute Walk   Phase Initial     Distance 1932 feet     Walk Time 6 minutes     # of Rest Breaks 0     MPH 3.66     METS 4.7     RPE 7     Perceived Dyspnea  0     VO2 Peak 16.44     Symptoms No     Resting HR 82 bpm     Resting BP 110/74     Resting Oxygen Saturation  99 %     Exercise Oxygen Saturation  during 6 min walk 100 %     Max Ex. HR 112 bpm     Max Ex. BP 152/64     2 Minute Post BP 130/68              Oxygen Initial Assessment:   Oxygen Re-Evaluation:   Oxygen Discharge (Final Oxygen Re-Evaluation):   Initial Exercise Prescription:  Initial Exercise Prescription - 12/17/21 0900       Date of Initial Exercise RX and Referring Provider   Date 12/17/21    Referring Provider [name], MD    Expected Discharge Date 02/12/22      Recumbant Bike   Level 2.5    RPM 60    Watts 55    Minutes 15    METs 4.7  Arm Ergometer   Level 2    RPM 60    Minutes 15    METs 4.7      Prescription Details   Frequency (times per week) 3    Duration Progress to 30 minutes of continuous  aerobic without signs/symptoms of physical distress      Intensity   THRR 40-80% of Max Heartrate 60-119    Ratings of Perceived Exertion 11-13    Perceived Dyspnea 0-4      Progression   Progression Continue progressive overload as per policy without signs/symptoms or physical distress.      Resistance Training   Training Prescription Yes    Weight 4 lbs    Reps 10-15             Perform Capillary Blood Glucose checks as needed.  Exercise Prescription Changes:   Exercise Prescription Changes     Row Name 12/21/21 1024 01/04/22 1037           Response to Exercise   Blood Pressure (Admit) 124/62 124/78      Blood Pressure (Exercise) 146/72 142/64      Blood Pressure (Exit) 120/62 122/60      Heart Rate (Admit) 99 bpm 96 bpm      Heart Rate (Exercise) 143 bpm 127 bpm      Heart Rate (Exit) 95 bpm 104 bpm      Rating of Perceived Exertion (Exercise) 9 10      Symptoms None None      Comments Decreased WL due to elevated heart rate. Will continue to monitor and request THRR increase as needed. Increased WL Arm Ergometer.      Duration Continue with 30 min of aerobic exercise without signs/symptoms of physical distress. Continue with 30 min of aerobic exercise without signs/symptoms of physical distress.      Intensity THRR unchanged THRR unchanged        Progression   Progression Continue to progress workloads to maintain intensity without signs/symptoms of physical distress. Continue to progress workloads to maintain intensity without signs/symptoms of physical distress.      Average METs 2.9 3.5        Resistance Training   Training Prescription Yes Yes      Weight 4 lbs 4 lbs      Reps 10-15 10-15      Time 10 Minutes 10 Minutes        Interval Training   Interval Training No No        NuStep   Level 3 4      SPM 83 86      Minutes 15 15      METs 2.8 2.5        Arm Ergometer   Level 2.5  Decreased from level 3.0 at 5 minutes due to elevated HR. 4.5       Watts -- 35      RPM -- 68      Minutes 15 15      METs 2.9 4.5               Exercise Comments:   Exercise Comments     Row Name 12/21/21 1132 01/04/22 1107         Exercise Comments Patient tolerate 1st session of exercise well without symptoms. Exceeded target heart rate range and very light workloads. Continue to monitor and request THRR increase as appropriate. Reviewed METs and goals with patient. Increased WL on arm ergometer  and tolerated well. Plan to increase WL on NuStep next session.               Exercise Goals and Review:   Exercise Goals     Row Name 12/17/21 0929             Exercise Goals   Increase Physical Activity Yes       Intervention Provide advice, education, support and counseling about physical activity/exercise needs.;Develop an individualized exercise prescription for aerobic and resistive training based on initial evaluation findings, risk stratification, comorbidities and participant's personal goals.       Expected Outcomes Short Term: Attend rehab on a regular basis to increase amount of physical activity.;Long Term: Add in home exercise to make exercise part of routine and to increase amount of physical activity.;Long Term: Exercising regularly at least 3-5 days a week.       Increase Strength and Stamina Yes       Intervention Provide advice, education, support and counseling about physical activity/exercise needs.;Develop an individualized exercise prescription for aerobic and resistive training based on initial evaluation findings, risk stratification, comorbidities and participant's personal goals.       Expected Outcomes Short Term: Increase workloads from initial exercise prescription for resistance, speed, and METs.;Short Term: Perform resistance training exercises routinely during rehab and add in resistance training at home;Long Term: Improve cardiorespiratory fitness, muscular endurance and strength as measured by increased METs  and functional capacity (6MWT)       Able to understand and use rate of perceived exertion (RPE) scale Yes       Intervention Provide education and explanation on how to use RPE scale       Expected Outcomes Short Term: Able to use RPE daily in rehab to express subjective intensity level;Long Term:  Able to use RPE to guide intensity level when exercising independently       Knowledge and understanding of Target Heart Rate Range (THRR) Yes       Intervention Provide education and explanation of THRR including how the numbers were predicted and where they are located for reference       Expected Outcomes Short Term: Able to state/look up THRR;Short Term: Able to use daily as guideline for intensity in rehab;Long Term: Able to use THRR to govern intensity when exercising independently       Understanding of Exercise Prescription Yes       Intervention Provide education, explanation, and written materials on patient's individual exercise prescription       Expected Outcomes Short Term: Able to explain program exercise prescription;Long Term: Able to explain home exercise prescription to exercise independently                Exercise Goals Re-Evaluation :  Exercise Goals Re-Evaluation     [place] Name 12/21/21 1132 01/04/22 1107           Exercise Goal Re-Evaluation   Exercise Goals Review Increase Physical Activity;Able to understand and use rate of perceived exertion (RPE) scale Increase Physical Activity;Able to understand and use rate of perceived exertion (RPE) scale;Increase Strength and Stamina      Comments Patient able to understand and use RPE scale appropriately. Patient's goal is to get back in shape safely. Patient also wants to gain weight. His goal weight is 165-168 lbs. Patient is currently walking 20-30 minutes daily.      Expected Outcomes Progress workloads as tolerated to help increase cardiorespiratory fitness, improve heart rate response. Continue daily  exercise routine.  Continue to progress workloads as tolerated.               Discharge Exercise Prescription (Final Exercise Prescription Changes):  Exercise Prescription Changes - 01/04/22 1037       Response to Exercise   Blood Pressure (Admit) 124/78    Blood Pressure (Exercise) 142/64    Blood Pressure (Exit) 122/60    Heart Rate (Admit) 96 bpm    Heart Rate (Exercise) 127 bpm    Heart Rate (Exit) 104 bpm    Rating of Perceived Exertion (Exercise) 10    Symptoms None    Comments Increased WL Arm Ergometer.    Duration Continue with 30 min of aerobic exercise without signs/symptoms of physical distress.    Intensity THRR unchanged      Progression   Progression Continue to progress workloads to maintain intensity without signs/symptoms of physical distress.    Average METs 3.5      Resistance Training   Training Prescription Yes    Weight 4 lbs    Reps 10-15    Time 10 Minutes      Interval Training   Interval Training No      NuStep   Level 4    SPM 86    Minutes 15    METs 2.5      Arm Ergometer   Level 4.5    Watts 35    RPM 68    Minutes 15    METs 4.5             Nutrition:  Target Goals: Understanding of nutrition guidelines, daily intake of sodium '1500mg'$ , cholesterol '200mg'$ , calories 30% from fat and 7% or less from saturated fats, daily to have 5 or more servings of fruits and vegetables.  Biometrics:  Pre Biometrics - 12/17/21 0815       Pre Biometrics   Waist Circumference 31 inches    Hip Circumference 36.75 inches    Waist to Hip Ratio 0.84 %    Triceps Skinfold 7 mm    % Body Fat 17.8 %    Grip Strength 49 kg    Flexibility 14 in    Single Leg Stand 20.18 seconds              Nutrition Therapy Plan and Nutrition Goals:  Nutrition Therapy & Goals - 12/21/21 1100       Nutrition Therapy   Diet Heart Healthy Diet    Drug/Food Interactions Statins/Certain Fruits      Personal Nutrition Goals   Nutrition Goal Patient to identify  strategies for managing cardiovascular risk by attending the weekly [facility] education and nutrition courses    Personal Goal #2 Patient to identify strategies for weight gain of 0.5-2.0# per week.    Personal Goal #3 Patient to identify food sources of saturated fat, trans fat, sodium, and refined carbohyrates    Comments Timothy [name] reports eating three meals per day in addition to two Boost High Protein daily (500 kcals, 40g protein). He reports significant unwanted weight loss and that his usual body weight is 165-170#. He reports significant muscle mass lost including in his upper body and arms. His estimated calories needs 2250-2655 (30-35kcals/kg UBW) and 90-113g protein (1.2-1.5g/kg UBW).      Intervention Plan   Intervention Prescribe, educate and counsel regarding individualized specific dietary modifications aiming towards targeted core components such as weight, hypertension, lipid management, diabetes, heart failure and other comorbidities.;Nutrition handout(s) given to patient.  Expected Outcomes Short Term Goal: Understand basic principles of dietary content, such as calories, fat, sodium, cholesterol and nutrients.;Short Term Goal: A plan has been developed with personal nutrition goals set during dietitian appointment.;Long Term Goal: Adherence to prescribed nutrition plan.             Nutrition Assessments:  Nutrition Assessments - 12/17/21 1607       Rate Your Plate Scores   Pre Score 60            MEDIFICTS Score Key: ?70 Need to make dietary changes  40-70 Heart Healthy Diet ? 40 Therapeutic Level Cholesterol Diet   Flowsheet Row INTENSIVE CARDIAC REHAB ORIENT from 12/17/2021 in [facility]  Picture Your Plate Total Score on Admission 60      Picture Your Plate Scores: <76 Unhealthy dietary pattern with much room for improvement. 41-50 Dietary pattern unlikely to meet recommendations for good health and room for  improvement. 51-60 More healthful dietary pattern, with some room for improvement.  >60 Healthy dietary pattern, although there may be some specific behaviors that could be improved.    Nutrition Goals Re-Evaluation:  Nutrition Goals Re-Evaluation     [place] Name 12/21/21 1100             Goals   Current Weight 141 lb 12.1 oz (64.3 kg)       Comment A1c 4.7, Triglyerides WNL (hx of hypertriglyeridemia), HDL 40, AST 71, ALT 57       Expected Outcome Timothy [name] reports eating three meals per day in addition to two Boost daily (480 kcals, 20g protein). He reports significant unwanted weight loss and that his usual body weight is 165-170#; he reports that weight has stabilized recently and he is no longer losing weight but maintaining. He reports significant muscle mass lost including in his upper body and arms. His estimated calories needs 2250-2655 (30-35kcals/kg UBW) and 90-113g protein (1.2-1.5g/kg UBW).                Nutrition Goals Re-Evaluation:  Nutrition Goals Re-Evaluation     [place] Name 12/21/21 1100             Goals   Current Weight 141 lb 12.1 oz (64.3 kg)       Comment A1c 4.7, Triglyerides WNL (hx of hypertriglyeridemia), HDL 40, AST 71, ALT 57       Expected Outcome Timothy [name] reports eating three meals per day in addition to two Boost daily (480 kcals, 20g protein). He reports significant unwanted weight loss and that his usual body weight is 165-170#; he reports that weight has stabilized recently and he is no longer losing weight but maintaining. He reports significant muscle mass lost including in his upper body and arms. His estimated calories needs 2250-2655 (30-35kcals/kg UBW) and 90-113g protein (1.2-1.5g/kg UBW).                Nutrition Goals Discharge (Final Nutrition Goals Re-Evaluation):  Nutrition Goals Re-Evaluation - 12/21/21 1100       Goals   Current Weight 141 lb 12.1 oz (64.3 kg)    Comment A1c 4.7, Triglyerides WNL (hx of  hypertriglyeridemia), HDL 40, AST 71, ALT 57    Expected Outcome Timothy [name] reports eating three meals per day in addition to two Boost daily (480 kcals, 20g protein). He reports significant unwanted weight loss and that his usual body weight is 165-170#; he reports that weight has stabilized recently and he is no longer losing weight  but maintaining. He reports significant muscle mass lost including in his upper body and arms. His estimated calories needs 2250-2655 (30-35kcals/kg UBW) and 90-113g protein (1.2-1.5g/kg UBW).             Psychosocial: Target Goals: Acknowledge presence or absence of significant depression and/or stress, maximize coping skills, provide positive support system. Participant is able to verbalize types and ability to use techniques and skills needed for reducing stress and depression.  Initial Review & Psychosocial Screening:  Initial Psych Review & Screening - 12/17/21 0848       Initial Review   Current issues with None Identified      Family Dynamics   Good Support System? Yes   Timothy [name] has his wife and two children for support     Barriers   Psychosocial barriers to participate in program There are no identifiable barriers or psychosocial needs.      Screening Interventions   Interventions Encouraged to exercise             Quality of Life Scores:  Quality of Life - 12/17/21 0953       Quality of Life   Select Quality of Life      Quality of Life Scores   Health/Function Pre 28.7 %    Socioeconomic Pre 28.63 %    Psych/Spiritual Pre 29.29 %    Family Pre 30 %    GLOBAL Pre 28.99 %            Scores of 19 and below usually indicate a poorer quality of life in these areas.  A difference of  2-3 points is a clinically meaningful difference.  A difference of 2-3 points in the total score of the Quality of Life Index has been associated with significant improvement in overall quality of life, self-image, physical symptoms, and general health  in studies assessing change in quality of life.  PHQ-9: Review Flowsheet  More data exists      12/17/2021 06/24/2021 05/13/2021 01/27/2021 10/16/2020  Depression screen PHQ 2/9  Decreased Interest 0 0 0 0 0 0  Down, Depressed, Hopeless 0 0 0 0 0 0  PHQ - 2 Score 0 0 0 0 0 0  Altered sleeping - 0 0 - -  Tired, decreased energy - 0 0 - -  Change in appetite - 0 0 - -  Feeling bad or failure about yourself  - 0 0 - -  Trouble concentrating - 0 0 - -  Moving slowly or fidgety/restless - 0 0 - -  Suicidal thoughts - 0 0 - -  PHQ-9 Score - 0 0 - -  Difficult doing work/chores - Not difficult at all - - -   Interpretation of Total Score  Total Score Depression Severity:  1-4 = Minimal depression, 5-9 = Mild depression, 10-14 = Moderate depression, 15-19 = Moderately severe depression, 20-27 = Severe depression   Psychosocial Evaluation and Intervention:   Psychosocial Re-Evaluation:  Psychosocial Re-Evaluation     Row Name 12/22/21 0757 01/05/22 1706           Psychosocial Re-Evaluation   Current issues with None Identified None Identified      Interventions Encouraged to attend Cardiac Rehabilitation for the exercise Encouraged to attend Cardiac Rehabilitation for the exercise      Continue Psychosocial Services  No Follow up required No Follow up required               Psychosocial Discharge (Final Psychosocial Re-Evaluation):  Psychosocial Re-Evaluation - 01/05/22 1706       Psychosocial Re-Evaluation   Current issues with None Identified    Interventions Encouraged to attend Cardiac Rehabilitation for the exercise    Continue Psychosocial Services  No Follow up required             Vocational Rehabilitation: Provide vocational rehab assistance to qualifying candidates.   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 Participant will state understanding/return demonstration of topics presented.    Education     Row Name 12/21/21 1300     Education   Cardiac Education Topics [facility]in   Environmental consultant Psychosocial   Psychosocial Workshop Recognizing and Reducing Stress   Instruction Review Code 1- Verbalizes Understanding   Class Start Time 1140   Class Stop Time 1230   Class Time Calculation (min) 50 min    [place] Name 12/23/21 1300     Education   Cardiac Education Topics [facility]in   Financial trader   Weekly Topic Fast and Healthy Breakfasts   Instruction Review Code 1- Verbalizes Understanding   Class Start Time 1138   Class Stop Time 1225   Class Time Calculation (min) 47 min    [place] Name 01/04/22 1200     Education   Cardiac Education Topics [facility]in   IT sales professional Nutrition   Nutrition Workshop Label Reading   Instruction Review Code 1- Information systems manager   Class Start Time 1140   Class Stop Time 1215   Class Time Calculation (min) 35 min            Core Videos: Exercise    Move It!  Clinical staff conducted group or individual video education with verbal and written material and guidebook.  Patient learns the recommended [facility] exercise program. Exercise with the goal of living a long, healthy life. Some of the health benefits of exercise include controlled diabetes, healthier blood pressure levels, improved cholesterol levels, improved heart and lung capacity, improved sleep, and better body composition. Everyone should speak with their doctor before starting or changing an  exercise routine.  Biomechanical Limitations Clinical staff conducted group or individual video education with verbal and written material and guidebook.  Patient learns how biomechanical limitations can impact exercise and how we can mitigate and possibly overcome limitations to have an impactful and balanced exercise routine.  Body Composition Clinical staff conducted group or individual video education with verbal and written material and guidebook.  Patient learns that body composition (ratio of muscle mass to fat mass) is a key component to assessing overall fitness, rather than body weight alone. Increased fat mass, especially visceral belly fat, can put [place] at increased risk for metabolic syndrome, type 2 diabetes, heart disease, and even death. It is recommended to combine diet and exercise (cardiovascular and resistance training) to improve your body composition. Seek guidance from your physician and exercise physiologist before  implementing an exercise routine.  Exercise Action Plan Clinical staff conducted group or individual video education with verbal and written material and guidebook.  Patient learns the recommended strategies to achieve and enjoy long-term exercise adherence, including variety, self-motivation, self-efficacy, and positive decision making. Benefits of exercise include fitness, good health, weight management, more energy, better sleep, less stress, and overall well-being.  Medical   Heart Disease Risk Reduction Clinical staff conducted group or individual video education with verbal and written material and guidebook.  Patient learns our heart is our most vital organ as it circulates oxygen, nutrients, white blood cells, and hormones throughout the entire body, and carries waste away. Data supports a plant-based eating plan like the Pritikin Program for its effectiveness in slowing progression of and reversing heart disease. The video provides a number of  recommendations to address heart disease.   Metabolic Syndrome and Belly Fat  Clinical staff conducted group or individual video education with verbal and written material and guidebook.  Patient learns what metabolic syndrome is, how it leads to heart disease, and how one can reverse it and keep it from coming back. You have metabolic syndrome if you have 3 of the following 5 criteria: abdominal obesity, high blood pressure, high triglycerides, low HDL cholesterol, and high blood sugar.  Hypertension and Heart Disease Clinical staff conducted group or individual video education with verbal and written material and guidebook.  Patient learns that high blood pressure, or hypertension, is very common in the [place]. Hypertension is largely due to excessive salt intake, but other important risk factors include being overweight, physical inactivity, drinking too much alcohol, smoking, and not eating enough potassium from fruits and vegetables. High blood pressure is a leading risk factor for heart attack, stroke, congestive heart failure, dementia, kidney failure, and premature death. Long-term effects of excessive salt intake include stiffening of the arteries and thickening of heart muscle and organ damage. Recommendations include ways to reduce hypertension and the risk of heart disease.  Diseases of Our Time - Focusing on Diabetes Clinical staff conducted group or individual video education with verbal and written material and guidebook.  Patient learns why the best way to stop diseases of our time is prevention, through food and other lifestyle changes. Medicine (such as prescription pills and surgeries) is often only a Band-Aid on the problem, not a long-term solution. Most common diseases of our time include obesity, type 2 diabetes, hypertension, heart disease, and cancer. The Pritikin Program is recommended and has been proven to help reduce, reverse, and/or prevent the damaging effects of  metabolic syndrome.  Nutrition   Overview of the Pritikin Eating Plan  Clinical staff conducted group or individual video education with verbal and written material and guidebook.  Patient learns about the [place] for disease risk reduction. The [place] emphasizes a wide variety of unrefined, minimally-processed carbohydrates, like fruits, vegetables, whole grains, and legumes. Go, Caution, and Stop food choices are explained. Plant-based and lean animal proteins are emphasized. Rationale provided for low sodium intake for blood pressure control, low added sugars for blood sugar stabilization, and low added fats and oils for coronary artery disease risk reduction and weight management.  Calorie Density  Clinical staff conducted group or individual video education with verbal and written material and guidebook.  Patient learns about calorie density and how it impacts the Pritikin Eating Plan. Knowing the characteristics of the food you choose will help you decide whether those foods will lead to weight gain  or weight loss, and whether you want to consume more or less of them. Weight loss is usually a side effect of the Pritikin Eating Plan because of its focus on low calorie-dense foods.  Label Reading  Clinical staff conducted group or individual video education with verbal and written material and guidebook.  Patient learns about the Pritikin recommended label reading guidelines and corresponding recommendations regarding calorie density, added sugars, sodium content, and whole grains.  Dining Out - Part 1  Clinical staff conducted group or individual video education with verbal and written material and guidebook.  Patient learns that restaurant meals can be sabotaging because they can be so high in calories, fat, sodium, and/or sugar. Patient learns recommended strategies on how to positively address this and avoid unhealthy pitfalls.  Facts on Fats  Clinical staff  conducted group or individual video education with verbal and written material and guidebook.  Patient learns that lifestyle modifications can be just as effective, if not more so, as many medications for lowering your risk of heart disease. A Pritikin lifestyle can help to reduce your risk of inflammation and atherosclerosis (cholesterol build-up, or plaque, in the artery walls). Lifestyle interventions such as dietary choices and physical activity address the cause of atherosclerosis. A review of the types of fats and their impact on blood cholesterol levels, along with dietary recommendations to reduce fat intake is also included.  Nutrition Action Plan  Clinical staff conducted group or individual video education with verbal and written material and guidebook.  Patient learns how to incorporate Pritikin recommendations into their lifestyle. Recommendations include planning and keeping personal health goals in mind as an important part of their success.  Healthy Mind-Set    Healthy Minds, Bodies, Hearts  Clinical staff conducted group or individual video education with verbal and written material and guidebook.  Patient learns how to identify when they are stressed. Video will discuss the impact of that stress, as well as the many benefits of stress management. Patient will also be introduced to stress management techniques. The way we think, act, and feel has an impact on our hearts.  How Our Thoughts Can Heal Our Hearts  Clinical staff conducted group or individual video education with verbal and written material and guidebook.  Patient learns that negative thoughts can cause depression and anxiety. This can result in negative lifestyle behavior and serious health problems. Cognitive behavioral therapy is an effective method to help control our thoughts in order to change and improve our emotional outlook.  Additional Videos:  Exercise    Improving Performance  Clinical staff conducted group  or individual video education with verbal and written material and guidebook.  Patient learns to use a non-linear approach by alternating intensity levels and lengths of time spent exercising to help burn more calories and lose more body fat. Cardiovascular exercise helps improve heart health, metabolism, hormonal balance, blood sugar control, and recovery from fatigue. Resistance training improves strength, endurance, balance, coordination, reaction time, metabolism, and muscle mass. Flexibility exercise improves circulation, posture, and balance. Seek guidance from your physician and exercise physiologist before implementing an exercise routine and learn your capabilities and proper form for all exercise.  Introduction to Yoga  Clinical staff conducted group or individual video education with verbal and written material and guidebook.  Patient learns about yoga, a discipline of the coming together of mind, breath, and body. The benefits of yoga include improved flexibility, improved range of motion, better posture and core strength, increased lung function, weight loss,  and positive self-image. Yoga's heart health benefits include lowered blood pressure, healthier heart rate, decreased cholesterol and triglyceride levels, improved immune function, and reduced stress. Seek guidance from your physician and exercise physiologist before implementing an exercise routine and learn your capabilities and proper form for all exercise.  Medical   Aging: Enhancing Your Quality of Life  Clinical staff conducted group or individual video education with verbal and written material and guidebook.  Patient learns key strategies and recommendations to stay in good physical health and enhance quality of life, such as prevention strategies, having an advocate, securing a [place], and keeping a list of medications and system for tracking them. It also discusses how to avoid risk for bone  loss.  Biology of Weight Control  Clinical staff conducted group or individual video education with verbal and written material and guidebook.  Patient learns that weight gain occurs because we consume more calories than we burn (eating more, moving less). Even if your body weight is normal, you may have higher ratios of fat compared to muscle mass. Too much body fat puts you at increased risk for cardiovascular disease, heart attack, stroke, type 2 diabetes, and obesity-related cancers. In addition to exercise, following the [place] can help reduce your risk.  Decoding Lab Results  Clinical staff conducted group or individual video education with verbal and written material and guidebook.  Patient learns that lab test reflects one measurement whose values change over time and are influenced by many factors, including medication, stress, sleep, exercise, food, hydration, pre-existing medical conditions, and more. It is recommended to use the knowledge from this video to become more involved with your lab results and evaluate your numbers to speak with your doctor.   Diseases of Our Time - Overview  Clinical staff conducted group or individual video education with verbal and written material and guidebook.  Patient learns that according to the CDC, 50% to 70% of chronic diseases (such as obesity, type 2 diabetes, elevated lipids, hypertension, and heart disease) are avoidable through lifestyle improvements including healthier food choices, listening to satiety cues, and increased physical activity.  Sleep Disorders Clinical staff conducted group or individual video education with verbal and written material and guidebook.  Patient learns how good quality and duration of sleep are important to overall health and well-being. Patient also learns about sleep disorders and how they impact health along with recommendations to address them, including discussing with a physician.  Nutrition   Dining Out - Part 2 Clinical staff conducted group or individual video education with verbal and written material and guidebook.  Patient learns how to plan ahead and communicate in order to maximize their dining experience in a healthy and nutritious manner. Included are recommended food choices based on the type of restaurant the patient is visiting.   Fueling a Best boy conducted group or individual video education with verbal and written material and guidebook.  There is a strong connection between our food choices and our health. Diseases like obesity and type 2 diabetes are very prevalent and are in large-part due to lifestyle choices. The Pritikin Eating Plan provides plenty of food and hunger-curbing satisfaction. It is easy to follow, affordable, and helps reduce health risks.  Menu Workshop  Clinical staff conducted group or individual video education with verbal and written material and guidebook.  Patient learns that restaurant meals can sabotage health goals because they are often packed with calories, fat, sodium,  and sugar. Recommendations include strategies to plan ahead and to communicate with the manager, chef, or server to help order a healthier meal.  Planning Your Eating Strategy  Clinical staff conducted group or individual video education with verbal and written material and guidebook.  Patient learns about the [place] and its benefit of reducing the risk of disease. The [place] does not focus on calories. Instead, it emphasizes high-quality, nutrient-rich foods. By knowing the characteristics of the foods, we choose, we can determine their calorie density and make informed decisions.  Targeting Your Nutrition Priorities  Clinical staff conducted group or individual video education with verbal and written material and guidebook.  Patient learns that lifestyle habits have a tremendous impact on disease risk and progression. This  video provides eating and physical activity recommendations based on your personal health goals, such as reducing LDL cholesterol, losing weight, preventing or controlling type 2 diabetes, and reducing high blood pressure.  Vitamins and Minerals  Clinical staff conducted group or individual video education with verbal and written material and guidebook.  Patient learns different ways to obtain key vitamins and minerals, including through a recommended healthy diet. It is important to discuss all supplements you take with your doctor.   Healthy Mind-Set    Smoking Cessation  Clinical staff conducted group or individual video education with verbal and written material and guidebook.  Patient learns that cigarette smoking and tobacco addiction pose a serious health risk which affects millions of people. Stopping smoking will significantly reduce the risk of heart disease, lung disease, and many forms of cancer. Recommended strategies for quitting are covered, including working with your doctor to develop a successful plan.  Culinary   Becoming a Financial trader conducted group or individual video education with verbal and written material and guidebook.  Patient learns that cooking at home can be healthy, cost-effective, quick, and puts them in control. Keys to cooking healthy recipes will include looking at your recipe, assessing your equipment needs, planning ahead, making it simple, choosing cost-effective seasonal ingredients, and limiting the use of added fats, salts, and sugars.  Cooking - Breakfast and Snacks  Clinical staff conducted group or individual video education with verbal and written material and guidebook.  Patient learns how important breakfast is to satiety and nutrition through the entire day. Recommendations include key foods to eat during breakfast to help stabilize blood sugar levels and to prevent overeating at meals later in the day. Planning ahead is also a  key component.  Cooking - Human resources officer conducted group or individual video education with verbal and written material and guidebook.  Patient learns eating strategies to improve overall health, including an approach to cook more at home. Recommendations include thinking of animal protein as a side on your plate rather than center stage and focusing instead on lower calorie dense options like vegetables, fruits, whole grains, and plant-based proteins, such as beans. Making sauces in large quantities to freeze for later and leaving the skin on your vegetables are also recommended to maximize your experience.  Cooking - Healthy Salads and Dressing Clinical staff conducted group or individual video education with verbal and written material and guidebook.  Patient learns that vegetables, fruits, whole grains, and legumes are the foundations of the [place]. Recommendations include how to incorporate each of these in flavorful and healthy salads, and how to create homemade salad dressings. Proper handling of ingredients is also covered. Cooking -  Soups and Desserts  Cooking - Soups and Desserts Clinical staff conducted group or individual video education with verbal and written material and guidebook.  Patient learns that Pritikin soups and desserts make for easy, nutritious, and delicious snacks and meal components that are low in sodium, fat, sugar, and calorie density, while high in vitamins, minerals, and filling fiber. Recommendations include simple and healthy ideas for soups and desserts.   Overview     The Pritikin Solution Program Overview Clinical staff conducted group or individual video education with verbal and written material and guidebook.  Patient learns that the results of the [place] Program have been documented in more than 100 articles published in peer-reviewed journals, and the benefits include reducing risk factors for (and, in some cases, even  reversing) high cholesterol, high blood pressure, type 2 diabetes, obesity, and more! An overview of the three key pillars of the Pritikin Program will be covered: eating well, doing regular exercise, and having a healthy mind-set.  WORKSHOPS  Exercise: Exercise Basics: Building Your Action Plan Clinical staff led group instruction and group discussion with PowerPoint presentation and patient guidebook. To enhance the learning environment the use of posters, models and videos may be added. At the conclusion of this workshop, patients will comprehend the difference between physical activity and exercise, as well as the benefits of incorporating both, into their routine. Patients will understand the FITT (Frequency, Intensity, Time, and Type) principle and how to use it to build an exercise action plan. In addition, safety concerns and other considerations for exercise and cardiac rehab will be addressed by the presenter. The purpose of this lesson is to promote a comprehensive and effective weekly exercise routine in order to improve patients' overall level of fitness.   Managing Heart Disease: Your Path to a Healthier Heart Clinical staff led group instruction and group discussion with PowerPoint presentation and patient guidebook. To enhance the learning environment the use of posters, models and videos may be added.At the conclusion of this workshop, patients will understand the anatomy and physiology of the heart. Additionally, they will understand how Pritikin's three pillars impact the risk factors, the progression, and the management of heart disease.  The purpose of this lesson is to provide a high-level overview of the heart, heart disease, and how the Pritikin lifestyle positively impacts risk factors.  Exercise Biomechanics Clinical staff led group instruction and group discussion with PowerPoint presentation and patient guidebook. To enhance the learning environment the use of  posters, models and videos may be added. Patients will learn how the structural parts of their bodies function and how these functions impact their daily activities, movement, and exercise. Patients will learn how to promote a neutral spine, learn how to manage pain, and identify ways to improve their physical movement in order to promote healthy living. The purpose of this lesson is to expose patients to common physical limitations that impact physical activity. Participants will learn practical ways to adapt and manage aches and pains, and to minimize their effect on regular exercise. Patients will learn how to maintain good posture while sitting, walking, and lifting.  Balance Training and Fall Prevention  Clinical staff led group instruction and group discussion with PowerPoint presentation and patient guidebook. To enhance the learning environment the use of posters, models and videos may be added. At the conclusion of this workshop, patients will understand the importance of their sensorimotor skills (vision, proprioception, and the vestibular system) in maintaining their ability to balance as they age. Patients  will apply a variety of balancing exercises that are appropriate for their current level of function. Patients will understand the common causes for poor balance, possible solutions to these problems, and ways to modify their physical environment in order to minimize their fall risk. The purpose of this lesson is to teach patients about the importance of maintaining balance as they age and ways to minimize their risk of falling.  WORKSHOPS   Nutrition:  Fueling a Scientist, research (physical sciences) led group instruction and group discussion with PowerPoint presentation and patient guidebook. To enhance the learning environment the use of posters, models and videos may be added. Patients will review the foundational principles of the [place] and understand what constitutes a  serving size in each of the food groups. Patients will also learn Pritikin-friendly foods that are better choices when away from home and review make-ahead meal and snack options. Calorie density will be reviewed and applied to three nutrition priorities: weight maintenance, weight loss, and weight gain. The purpose of this lesson is to reinforce (in a group setting) the key concepts around what patients are recommended to eat and how to apply these guidelines when away from home by planning and selecting Pritikin-friendly options. Patients will understand how calorie density may be adjusted for different weight management goals.  Mindful Eating  Clinical staff led group instruction and group discussion with PowerPoint presentation and patient guidebook. To enhance the learning environment the use of posters, models and videos may be added. Patients will briefly review the concepts of the [place] and the importance of low-calorie dense foods. The concept of mindful eating will be introduced as well as the importance of paying attention to internal hunger signals. Triggers for non-hunger eating and techniques for dealing with triggers will be explored. The purpose of this lesson is to provide patients with the opportunity to review the basic principles of the [place], discuss the value of eating mindfully and how to measure internal cues of hunger and fullness using the Hunger Scale. Patients will also discuss reasons for non-hunger eating and learn strategies to use for controlling emotional eating.  Targeting Your Nutrition Priorities Clinical staff led group instruction and group discussion with PowerPoint presentation and patient guidebook. To enhance the learning environment the use of posters, models and videos may be added. Patients will learn how to determine their genetic susceptibility to disease by reviewing their family history. Patients will gain insight into the  importance of diet as part of an overall healthy lifestyle in mitigating the impact of genetics and other environmental insults. The purpose of this lesson is to provide patients with the opportunity to assess their personal nutrition priorities by looking at their family history, their own health history and current risk factors. Patients will also be able to discuss ways of prioritizing and modifying the [place] for their highest risk areas  Menu  Clinical staff led group instruction and group discussion with PowerPoint presentation and patient guidebook. To enhance the learning environment the use of posters, models and videos may be added. Using menus brought in from [employer], or printed from [employer], patients will apply the [place] dining out guidelines that were presented in the [employer] video. Patients will also be able to practice these guidelines in a variety of provided scenarios. The purpose of this lesson is to provide patients with the opportunity to practice hands-on learning of the [place] with actual menus  and practice scenarios.  Label Reading Clinical staff led group instruction and group discussion with PowerPoint presentation and patient guidebook. To enhance the learning environment the use of posters, models and videos may be added. Patients will review and discuss the Pritikin label reading guidelines presented in Pritikin's Label Reading Educational series video. Using fool labels brought in from local grocery stores and markets, patients will apply the label reading guidelines and determine if the packaged food meet the Pritikin guidelines. The purpose of this lesson is to provide patients with the opportunity to review, discuss, and practice hands-on learning of the Pritikin Label Reading guidelines with actual packaged food labels. [place] Workshops are designed to teach  patients ways to prepare quick, simple, and affordable recipes at home. The importance of nutrition's role in chronic disease risk reduction is reflected in its emphasis in the overall Pritikin program. By learning how to prepare essential core Pritikin Eating Plan recipes, patients will increase control over what they eat; be able to customize the flavor of foods without the use of added salt, sugar, or fat; and improve the quality of the food they consume. By learning a set of core recipes which are easily assembled, quickly prepared, and affordable, patients are more likely to prepare more healthy foods at home. These workshops focus on convenient breakfasts, simple entres, side dishes, and desserts which can be prepared with minimal effort and are consistent with nutrition recommendations for cardiovascular risk reduction. Cooking [employer] are taught by a Engineer, materials (RD) who has been trained by the [employer]. The chef or RD has a clear understanding of the importance of minimizing - if not completely eliminating - added fat, sugar, and sodium in recipes. Throughout the series of [facility] Workshop sessions, patients will learn about healthy ingredients and efficient methods of cooking to build confidence in their capability to prepare    Cooking School weekly topics:  Adding Flavor- Sodium-Free  Fast and Healthy Breakfasts  Powerhouse Plant-Based Proteins  Satisfying Salads and Dressings  Simple Sides and Sauces  International Cuisine-Spotlight on the [place] Zones  Delicious Desserts  Savory Soups  Efficiency Cooking - Meals in a Snap  Tasty Appetizers and Snacks  Comforting Weekend Breakfasts  One-Pot Wonders   Fast Evening Meals  Easy [place] (Psychosocial): New Thoughts, New Behaviors Clinical staff led group instruction and group discussion with PowerPoint presentation  and patient guidebook. To enhance the learning environment the use of posters, models and videos may be added. Patients will learn and practice techniques for developing effective health and lifestyle goals. Patients will be able to effectively apply the goal setting process learned to develop at least one new personal goal.  The purpose of this lesson is to expose patients to a new skill set of behavior modification techniques such as techniques setting SMART goals, overcoming barriers, and achieving new thoughts and new behaviors.  Managing Moods and Relationships Clinical staff led group instruction and group discussion with PowerPoint presentation and patient guidebook. To enhance the learning environment the use of posters, models and videos may be added. Patients will learn how emotional and chronic stress factors can impact their health and relationships. They will learn healthy ways to manage their moods and utilize positive coping mechanisms. In addition, ICR patients will learn ways to improve communication skills. The purpose of this lesson is to expose patients to ways of understanding  how one's mood and health are intimately connected. Developing a healthy outlook can help build positive relationships and connections with others. Patients will understand the importance of utilizing effective communication skills that include actively listening and being heard. They will learn and understand the importance of the "4 Cs" and especially Connections in fostering of a Healthy Mind-Set.  Healthy Sleep for a Healthy Heart Clinical staff led group instruction and group discussion with PowerPoint presentation and patient guidebook. To enhance the learning environment the use of posters, models and videos may be added. At the conclusion of this workshop, patients will be able to demonstrate knowledge of the importance of sleep to overall health, well-being, and quality of life. They will understand the  symptoms of, and treatments for, common sleep disorders. Patients will also be able to identify daytime and nighttime behaviors which impact sleep, and they will be able to apply these tools to help manage sleep-related challenges. The purpose of this lesson is to provide patients with a general overview of sleep and outline the importance of quality sleep. Patients will learn about a few of the most common sleep disorders. Patients will also be introduced to the concept of "sleep hygiene," and discover ways to self-manage certain sleeping problems through simple daily behavior changes. Finally, the workshop will motivate patients by clarifying the links between quality sleep and their goals of heart-healthy living.   Recognizing and Reducing Stress Clinical staff led group instruction and group discussion with PowerPoint presentation and patient guidebook. To enhance the learning environment the use of posters, models and videos may be added. At the conclusion of this workshop, patients will be able to understand the types of stress reactions, differentiate between acute and chronic stress, and recognize the impact that chronic stress has on their health. They will also be able to apply different coping mechanisms, such as reframing negative self-talk. Patients will have the opportunity to practice a variety of stress management techniques, such as deep abdominal breathing, progressive muscle relaxation, and/or guided imagery.  The purpose of this lesson is to educate patients on the role of stress in their lives and to provide healthy techniques for coping with it.  Learning Barriers/Preferences:  Learning Barriers/Preferences - 12/17/21 0955       Learning Barriers/Preferences   Learning Barriers Sight;Hearing   wears reading glasses, wears bilateral hearing aids   Learning Preferences Written Material;Computer/Internet;Group Instruction;Skilled Demonstration;Individual Instruction              Education Topics:  Knowledge Questionnaire Score:  Knowledge Questionnaire Score - 12/17/21 0953       Knowledge Questionnaire Score   Pre Score 21/24             Core Components/Risk Factors/Patient Goals at Admission:  Personal Goals and Risk Factors at Admission - 12/17/21 0953       Core Components/Risk Factors/Patient Goals on Admission    Weight Management Weight Gain;Yes    Intervention Weight Management: Develop a combined nutrition and exercise program designed to reach desired caloric intake, while maintaining appropriate intake of nutrient and fiber, sodium and fats, and appropriate energy expenditure required for the weight goal.;Weight Management: Provide education and appropriate resources to help participant work on and attain dietary goals.    Admit Weight 143 lb (64.9 kg)    Goal Weight: Long Term 165 lb (74.8 kg)    Expected Outcomes Weight Gain: Understanding of general recommendations for a high calorie, high protein meal plan that promotes weight gain by distributing  calorie intake throughout the day with the consumption for 4-5 meals, snacks, and/or supplements    Hypertension Yes    Intervention Provide education on lifestyle modifcations including regular physical activity/exercise, weight management, moderate sodium restriction and increased consumption of fresh fruit, vegetables, and low fat dairy, alcohol moderation, and smoking cessation.;Monitor prescription use compliance.    Expected Outcomes Short Term: Continued assessment and intervention until BP is < 140/68m HG in hypertensive participants. < 130/827mHG in hypertensive participants with diabetes, heart failure or chronic kidney disease.;Long Term: Maintenance of blood pressure at goal levels.    Lipids Yes    Intervention Provide education and support for participant on nutrition & aerobic/resistive exercise along with prescribed medications to achieve LDL '70mg'$ , HDL >'40mg'$ .    Expected  Outcomes Short Term: Participant states understanding of desired cholesterol values and is compliant with medications prescribed. Participant is following exercise prescription and nutrition guidelines.;Long Term: Cholesterol controlled with medications as prescribed, with individualized exercise RX and with personalized nutrition plan. Value goals: LDL < '70mg'$ , HDL > 40 mg.             Core Components/Risk Factors/Patient Goals Review:   Goals and Risk Factor Review     Row Name 12/22/21 0758 01/05/22 1706           Core Components/Risk Factors/Patient Goals Review   Personal Goals Review Weight Management/Obesity;Hypertension;Lipids Weight Management/Obesity;Hypertension;Lipids      Review Timothy [name] started intensive cardiac rehab on 12/21/21 and did well with exercise, vital signs were stable. Timothy [name] did exceed his target heart rate. Will continue to monitor. Timothy [name] started intensive cardiac rehab on 12/21/21 and is off to a good start to exercise , vital signs were stable. ChEduard Closas gained 1.1 kg since starting the program. Weight gainis a goal for Timothy [name]      Expected Outcomes ChEduard Closill continue to participate in intensive cardiac rehab for exercise, nutrition and lifestyle modifications ChEduard Closill continue to participate in intensive cardiac rehab for exercise, nutrition and lifestyle modifications               Core Components/Risk Factors/Patient Goals at Discharge (Final Review):   Goals and Risk Factor Review - 01/05/22 1706       Core Components/Risk Factors/Patient Goals Review   Personal Goals Review Weight Management/Obesity;Hypertension;Lipids    Review Timothy [name] started intensive cardiac rehab on 12/21/21 and is off to a good start to exercise , vital signs were stable. ChEduard Closas gained 1.1 kg since starting the program. Weight gainis a goal for Timothy [name]    Expected Outcomes ChEduard Closill continue to participate in intensive cardiac rehab for exercise, nutrition  and lifestyle modifications             ITP Comments:  ITP Comments     Row Name 12/17/21 0845 12/22/21 0756 01/05/22 1705       ITP Comments Dr TrFransico HimD, Medical Director. Introduction to [facility]/ Intensive Cardiac Rehab. Initial Orientation Packet Reviewed with the patient 30 Day ITP Review. Timothy [name] started intensive cardiac rehab on 12/21/21 and did well with exercise. 30 Day ITP Review. Timothy [name] started intensive cardiac rehab on 12/21/21 and is off to a good start to exercise              Comments: See ITP comments.MaHarrell GaveN BSN

## 2022-01-05 NOTE — Telephone Encounter (Signed)
No note needed 

## 2022-01-05 NOTE — Progress Notes (Signed)
Pt came in for his high dose flu vaccine and tolerated injection well. Gave in right deltoid

## 2022-01-06 ENCOUNTER — Encounter (HOSPITAL_COMMUNITY)
Admission: RE | Admit: 2022-01-06 | Discharge: 2022-01-06 | Disposition: A | Discharge status: Home / Self Care | Payer: Medicare Other | Source: Ambulatory Visit | Attending: Cardiology | Admitting: Cardiology

## 2022-01-06 ENCOUNTER — Encounter: Payer: Self-pay | Admitting: Cardiology

## 2022-01-06 ENCOUNTER — Other Ambulatory Visit: Payer: Self-pay

## 2022-01-06 DIAGNOSIS — Z951 Presence of aortocoronary bypass graft: Secondary | ICD-10-CM | POA: Diagnosis not present

## 2022-01-06 DIAGNOSIS — I214 Non-ST elevation (NSTEMI) myocardial infarction: Secondary | ICD-10-CM

## 2022-01-06 MED ORDER — METOPROLOL TARTRATE 25 MG PO TABS: 12.5000 mg | ORAL_TABLET | Freq: Two times a day (BID) | ORAL | 2 refills | Status: DC

## 2022-01-06 MED ORDER — ATORVASTATIN CALCIUM 80 MG PO TABS: 80.0000 mg | ORAL_TABLET | Freq: Every day | ORAL | 2 refills | Status: DC

## 2022-01-06 NOTE — Progress Notes (Signed)
Reviewed home exercise guidelines with patient including endpoints, temperature precautions, target heart rate and rate of perceived exertion. Patient is currently walking 30-40 minutes daily as his mode of home exercise. Patient is a member at [employer] where he previously was swimming and walking. Patient also has a home gym with weights that he plans to use for his resistance training now that he has no restrictions from the surgeon. Patient voices understanding of instructions given.  Sol Passer, MS, ACSM CEP

## 2022-01-08 ENCOUNTER — Encounter (HOSPITAL_COMMUNITY): Payer: Medicare Other

## 2022-01-11 ENCOUNTER — Encounter (HOSPITAL_COMMUNITY)
Admission: RE | Admit: 2022-01-11 | Discharge: 2022-01-11 | Disposition: A | Discharge status: Home / Self Care | Payer: Medicare Other | Source: Ambulatory Visit | Attending: Cardiology | Admitting: Cardiology

## 2022-01-11 DIAGNOSIS — I214 Non-ST elevation (NSTEMI) myocardial infarction: Secondary | ICD-10-CM

## 2022-01-11 DIAGNOSIS — Z951 Presence of aortocoronary bypass graft: Secondary | ICD-10-CM | POA: Diagnosis not present

## 2022-01-13 ENCOUNTER — Encounter (HOSPITAL_COMMUNITY)
Admission: RE | Admit: 2022-01-13 | Discharge: 2022-01-13 | Disposition: A | Discharge status: Home / Self Care | Payer: Medicare Other | Source: Ambulatory Visit | Attending: Cardiology | Admitting: Cardiology

## 2022-01-13 DIAGNOSIS — Z951 Presence of aortocoronary bypass graft: Secondary | ICD-10-CM

## 2022-01-13 DIAGNOSIS — I214 Non-ST elevation (NSTEMI) myocardial infarction: Secondary | ICD-10-CM

## 2022-01-14 ENCOUNTER — Ambulatory Visit (INDEPENDENT_AMBULATORY_CARE_PROVIDER_SITE_OTHER): Payer: Medicare Other | Admitting: Family Medicine

## 2022-01-14 ENCOUNTER — Encounter: Payer: Self-pay | Admitting: Family Medicine

## 2022-01-14 VITALS — BP 118/60 | HR 83 | Temp 98.3°F | Ht 69.0 in | Wt 146.8 lb

## 2022-01-14 DIAGNOSIS — Z951 Presence of aortocoronary bypass graft: Secondary | ICD-10-CM | POA: Diagnosis not present

## 2022-01-14 DIAGNOSIS — I249 Acute ischemic heart disease, unspecified: Secondary | ICD-10-CM

## 2022-01-14 DIAGNOSIS — R7989 Other specified abnormal findings of blood chemistry: Secondary | ICD-10-CM | POA: Diagnosis not present

## 2022-01-14 DIAGNOSIS — D649 Anemia, unspecified: Secondary | ICD-10-CM

## 2022-01-14 DIAGNOSIS — I10 Essential (primary) hypertension: Secondary | ICD-10-CM | POA: Diagnosis not present

## 2022-01-14 DIAGNOSIS — E785 Hyperlipidemia, unspecified: Secondary | ICD-10-CM | POA: Diagnosis not present

## 2022-01-14 LAB — COMPREHENSIVE METABOLIC PANEL
ALT: 89 U/L — ABNORMAL HIGH (ref 0–53)
AST: 117 U/L — ABNORMAL HIGH (ref 0–37)
Albumin: 4.4 g/dL (ref 3.5–5.2)
Alkaline Phosphatase: 95 U/L (ref 39–117)
BUN: 14 mg/dL (ref 6–23)
CO2: 29 mEq/L (ref 19–32)
Calcium: 9.5 mg/dL (ref 8.4–10.5)
Chloride: 100 mEq/L (ref 96–112)
Creatinine, Ser: 0.82 mg/dL (ref 0.40–1.50)
GFR: 88.62 mL/min (ref >60.00)
Glucose, Bld: 85 mg/dL (ref 70–99)
Potassium: 4.2 mEq/L (ref 3.5–5.1)
Sodium: 136 mEq/L (ref 135–145)
Total Bilirubin: 1.1 mg/dL (ref 0.2–1.2)
Total Protein: 7.3 g/dL (ref 6.0–8.3)

## 2022-01-14 LAB — CBC
HCT: 38.2 % — ABNORMAL LOW (ref 39.0–52.0)
Hemoglobin: 13.2 g/dL (ref 13.0–17.0)
MCHC: 34.7 g/dL (ref 30.0–36.0)
MCV: 99.7 fl (ref 78.0–100.0)
Platelets: 86 10*3/uL — ABNORMAL LOW (ref 150.0–400.0)
RBC: 3.83 Mil/uL — ABNORMAL LOW (ref 4.22–5.81)
RDW: 13.2 % (ref 11.5–15.5)
WBC: 3.9 10*3/uL — ABNORMAL LOW (ref 4.0–10.5)

## 2022-01-14 LAB — LIPID PANEL
Cholesterol: 114 mg/dL (ref 0–200)
HDL: 36.9 mg/dL — ABNORMAL LOW (ref >39.00)
NonHDL: 76.93
Total CHOL/HDL Ratio: 3
Triglycerides: 299 mg/dL — ABNORMAL HIGH (ref 0.0–149.0)
VLDL: 59.8 mg/dL — ABNORMAL HIGH (ref 0.0–40.0)

## 2022-01-14 LAB — LDL CHOLESTEROL, DIRECT: Direct LDL: 49 mg/dL

## 2022-01-14 NOTE — Patient Instructions (Addendum)
I will check labs today including liver tests and anemia test.  Glad to hear you are doing well!  Take care.

## 2022-01-14 NOTE — Progress Notes (Signed)
Subjective:  Patient ID: Timothy [name], male    DOB: 07-Jul-1950  Age: 71 y.o. 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  Postsurgical course complicated with anemia requiring blood transfusion. Has followed up with cardiology, Dr. Agustin Cree, and cardiovascular surgeon, Dr. Darcey Nora in the past few weeks. Next appt with Dr. Agustin Cree 12/5.  November 6 visit with cardiovascular surgeon noted, blood pressure has been controlled on metoprolol 12.5 mg twice daily., not needing lisinopril at that time. Currently taking just metoprolol at this time. BP dropped too low on both meds.  Home readings: 120/60 range.  No new bleeding, no CP/dyspnea. Feels best he has in years.  In cardiac rehab.  BP Readings from Last 3 Encounters:  01/14/22 118/60  01/04/22 (!) 149/80  12/17/21 110/74   Lab Results  Component Value Date   CREATININE 0.87 10/05/2021    Hyperlipidemia: Treated with high-dose statin, Lipitor 80 mg daily.  Not fasting this am.  Lab Results  Component Value Date   CHOL 146 09/28/2021   HDL 40 (L) 09/28/2021   LDLCALC 80 09/28/2021   LDLDIRECT 121.0 06/24/2021   TRIG 132 09/28/2021   CHOLHDL 3.7 09/28/2021   Lab Results  Component Value Date   ALT 57 (H) 09/29/2021   AST 71 (H) 09/29/2021   ALKPHOS 62 09/29/2021   BILITOT 2.3 (H) 09/29/2021  Elevated lft's - noted prior. Ultrasound in 06/2019: 1. Increase in liver echogenicity, a finding indicative of hepatic steatosis. No focal liver lesions are evident. Note that the sensitivity of ultrasound for detection of focal liver lesions is somewhat diminished in this circumstance.  Anemia As above, postop, nadir of 7.5.  Received blood transfusion August 8th.  Improved hemoglobin of 10.8 on  10/15/2021.  13.1 on July 30. No new bleeding, feeling well.  Lab Results  Component Value Date   WBC 5.1 10/15/2021   HGB 10.8 (L) 10/15/2021   HCT 31.4 (L) 10/15/2021   MCV 98 (H) 10/15/2021   PLT 181 10/15/2021    HM: up to date.   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   NSTEMI (non-ST elevated myocardial infarction) ([place]) 09/27/2021   Hyperlipidemia 06/24/2021   Aortic atherosclerosis ([place]) 06/24/2021   Pain of left hip joint 05/23/2021   Pseudophakia, both eyes 11/18/2020   Pancytopenia ([place]) 10/17/2020   Hyponatremia 10/17/2020   Hypertension    Chronic kidney disease    COVID-19 01/2020   Pain in joint of right knee 11/28/2019   Steatosis of liver 08/24/2019   Bilateral hearing loss 08/24/2019   Hypertriglyceridemia 08/15/2019   Follow-up examination after eye surgery 07/19/2019   Right epiretinal membrane 07/19/2019   Left epiretinal membrane 07/19/2019   Posterior vitreous detachment of left eye 07/19/2019   Atypical chest pain 07/13/2019   Subacromial impingement 01/16/2019   Elevated LFTs 09/02/2018   Need for shingles vaccine 09/02/2018   Healthcare maintenance 08/22/2018   Need for influenza vaccination 12/22/2017   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/09/2017   Essential hypertension 04/07/2016   COLONIC POLYPS, HX OF 02/12/2009   Dyslipidemia 11/21/2007   Osteoarthritis 01/17/2007  NEPHROLITHIASIS, HX OF 01/17/2007   Past Medical History:  Diagnosis Date   Atypical chest pain 07/13/2019   Bilateral hearing loss 08/24/2019   Cataract    Cataract    Chronic kidney disease    kidney stones   Chronic pain of left knee 11/28/2019   COLONIC POLYPS, HX OF 02/12/2009   Coronary artery disease    COVID-19 01/2020   Dyslipidemia 11/21/2007   Qualifier: Diagnosis of  By: Burnice Logan  MD, Doretha Sou    Elevated LFTs 09/02/2018    Essential hypertension 04/07/2016   Treatment initiated February 2018   Follow-up examination after eye surgery 07/19/2019   Healthcare maintenance 08/22/2018   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/09/2017   HYPERLIPIDEMIA 11/21/2007   Hypertension    Hypertriglyceridemia 08/15/2019   Left epiretinal membrane 07/19/2019   Need for influenza vaccination 12/22/2017   Need for shingles vaccine 09/02/2018   NEPHROLITHIASIS, HX OF 01/17/2007   OSTEOARTHRITIS 01/17/2007   Osteoarthritis 01/17/2007   Qualifier: Diagnosis of  By: Burnice Logan  MD, Doretha Sou    Posterior vitreous detachment of left eye 07/19/2019   Right epiretinal membrane 07/19/2019   Steatosis of liver 08/24/2019   Subacromial impingement 01/16/2019   Past Surgical History:  Procedure Laterality Date   APPENDECTOMY     CARDIAC CATHETERIZATION     CARPAL TUNNEL RELEASE Right    CATARACT EXTRACTION Bilateral    june 2019   CORONARY ARTERY BYPASS GRAFT N/A 09/30/2021   Procedure: CORONARY ARTERY BYPASS GRAFTING (CABG) X 3 BYPASSES USING LEFT INTERNAL MAMMARY ARTERY AND RIGHT LEG ENDOHARVESTED SAPHENOUS VEIN GRAFT;  Surgeon: Dahlia Byes, MD;  Location: [facility];  Service: Open Heart Surgery;  Laterality: N/A;   HERNIA REPAIR     ingunial   JOINT REPLACEMENT     KNEE SURGERY Right    x 4   LEFT HEART CATH AND CORONARY ANGIOGRAPHY N/A 09/28/2021   Procedure: LEFT HEART CATH AND CORONARY ANGIOGRAPHY;  Surgeon: Lorretta Harp, MD;  Location: [facility];  Service: Cardiovascular;  Laterality: N/A;   MENISCUS REPAIR Left    knee; x 3   REPLACEMENT TOTAL KNEE Right    SHOULDER SURGERY     left   TEE WITHOUT CARDIOVERSION N/A 09/30/2021   Procedure: TRANSESOPHAGEAL ECHOCARDIOGRAM (TEE);  Surgeon: Dahlia Byes, MD;  Location: [facility];  Service: Open Heart Surgery;  Laterality: N/A;   TOTAL KNEE ARTHROPLASTY     right   No Known Allergies Prior to Admission medications   Medication Sig Start Date End Date  Taking? Authorizing Provider  acetaminophen (TYLENOL) 325 MG tablet Take 2 tablets (650 mg total) by mouth every 6 (six) hours as needed for mild pain or moderate pain. 10/05/21  Yes Gold, Wayne E, PA-C  atorvastatin (LIPITOR) 80 MG tablet Take 1 tablet (80 mg total) by mouth daily. 01/06/22  Yes Park Liter, MD  metoprolol tartrate (LOPRESSOR) 25 MG tablet Take 0.5 tablets (12.5 mg total) by mouth 2 (two) times daily. 01/06/22  Yes Park Liter, MD  Multiple Vitamin (MULTIVITAMIN) tablet Take 1 tablet by mouth daily. Unknown strength   Yes [provider]  triamcinolone cream (KENALOG) 0.1 % Apply 1 Application topically daily as needed (For rash). 08/23/21  Yes [provider]  triamcinolone ointment (KENALOG) 0.1 % Apply 1 Application topically daily as needed (For rash). 04/23/21  Yes [provider]  meloxicam (MOBIC) 7.5 MG tablet Take 1 tablet (7.5 mg total) by mouth daily as  needed for pain. Patient not taking: Reported on 01/14/2022 05/13/21   Wendie Agreste, MD  pantoprazole (PROTONIX) 40 MG tablet TAKE 1 TABLET BY MOUTH EVERY DAY Patient not taking: Reported on 01/14/2022 10/28/21   Irene Shipper, MD   Social History   Socioeconomic History   Marital status: Married    Spouse name: Not on file   Number of children: 2   Years of education: 16   Highest education level: Bachelor's degree (e.g., BA, AB, BS)  Occupational History   Not on file  Tobacco Use   Smoking status: Never   Smokeless tobacco: Never  Vaping Use   Vaping Use: Never used  Substance and Sexual Activity   Alcohol use: Yes    Alcohol/week: 1.0 standard drink of alcohol    Types: 1 Cans of beer per week    Comment: 1-2 beers a day   Drug use: Never   Sexual activity: Not Currently  Other Topics Concern   Not on file  Social History Narrative   ** Merged History Encounter **       Social Determinants of Health   Financial Resource Strain: Low Risk  (01/27/2021)    Overall Financial Resource Strain (CARDIA)    Difficulty of Paying Living Expenses: Not hard at all  Food Insecurity: No Food Insecurity (01/27/2021)   Hunger Vital Sign    Worried About Running Out of Food in the Last Year: Never true    Ran Out of Food in the Last Year: Never true  Transportation Needs: No Transportation Needs (01/27/2021)   PRAPARE - Hydrologist (Medical): No    Lack of Transportation (Non-Medical): No  Physical Activity: Sufficiently Active (01/27/2021)   Exercise Vital Sign    Days of Exercise per Week: 6 days    Minutes of Exercise per Session: 60 min  Stress: No Stress Concern Present (01/27/2021)   [place]    Feeling of Stress : Not at all  Social Connections: [place] (01/27/2021)   Social Connection and Isolation Panel [NHANES]    Frequency of Communication with Friends and Family: Twice a week    Frequency of Social Gatherings with Friends and Family: Twice a week    Attends Religious Services: More than 4 times per year    Active Member of [employer] or Organizations: Yes    Attends Archivist Meetings: 1 to 4 times per year    Marital Status: Married  Human resources officer Violence: Not At Risk (01/27/2021)   Humiliation, Afraid, Rape, and Kick questionnaire    Fear of Current or Ex-Partner: No    Emotionally Abused: No    Physically Abused: No    Sexually Abused: No    Review of Systems  Constitutional:  Negative for fatigue and unexpected weight change.  Eyes:  Negative for visual disturbance.  Respiratory:  Negative for cough, chest tightness and shortness of breath.   Cardiovascular:  Negative for chest pain, palpitations and leg swelling.  Gastrointestinal:  Negative for abdominal pain and blood in stool.  Neurological:  Negative for dizziness, light-headedness and headaches.     Objective:   Vitals:   01/14/22 0830  BP: 118/60   Pulse: 83  Temp: 98.3 F (36.8 C)  SpO2: 99%  Weight: 146 lb 12.8 oz (66.6 kg)  Height: '5\' 9"'$  (1.753 m)     Physical Exam Vitals reviewed.  Constitutional:  Appearance: He is well-developed.  HENT:     Head: Normocephalic and atraumatic.  Neck:     Vascular: No carotid bruit or JVD.  Cardiovascular:     Rate and Rhythm: Normal rate and regular rhythm.     Heart sounds: Normal heart sounds. No murmur heard. Pulmonary:     Effort: Pulmonary effort is normal.     Breath sounds: Normal breath sounds. No rales.  Musculoskeletal:     Right lower leg: No edema.     Left lower leg: No edema.  Skin:    General: Skin is warm and dry.  Neurological:     Mental Status: He is alert and oriented to person, place, and time.  Psychiatric:        Mood and Affect: Mood normal.        Assessment & Plan:  Timothy [name] is a 71 y.o. male . S/P CABG x 3  -History of NSTEMI, CABG as above, doing well, feeling well, undergoing cardiac rehab.  Stable blood pressure.  We will check updated labs including lipids not fasting, but will evaluate LDL to see if at goal.  Continue follow-up with cardiology  Elevated LFTs  -Chronic issue, previous ultrasound in 2021 noted with likely hepatic steatosis.  Check updated labs.  Hyperlipidemia, unspecified hyperlipidemia type  -Tolerating Lipitor, continue same dose, labs as above.  Essential hypertension  -Stable with low-dose metoprolol, no changes for now, hold on ACE inhibitor, follow-up with cardiology as planned.  Anemia, unspecified type  -Suspected postop anemia but improved after transfusion, check updated CBC.  Asymptomatic.  No orders of the defined types were placed in this encounter.  Patient Instructions  I will check labs today including liver tests and anemia test.  Glad to hear you are doing well!  Take care.       Signed,   Merri Ray, MD [place] [place]  [facility] 01/14/22 9:01 AM

## 2022-01-15 ENCOUNTER — Encounter (HOSPITAL_COMMUNITY)
Admission: RE | Admit: 2022-01-15 | Discharge: 2022-01-15 | Disposition: A | Discharge status: Home / Self Care | Payer: Medicare Other | Source: Ambulatory Visit | Attending: Cardiology | Admitting: Cardiology

## 2022-01-15 DIAGNOSIS — Z951 Presence of aortocoronary bypass graft: Secondary | ICD-10-CM

## 2022-01-15 DIAGNOSIS — I214 Non-ST elevation (NSTEMI) myocardial infarction: Secondary | ICD-10-CM

## 2022-01-18 ENCOUNTER — Encounter (HOSPITAL_COMMUNITY)
Admission: RE | Admit: 2022-01-18 | Discharge: 2022-01-18 | Disposition: A | Discharge status: Home / Self Care | Payer: Medicare Other | Source: Ambulatory Visit | Attending: Cardiology | Admitting: Cardiology

## 2022-01-18 DIAGNOSIS — Z951 Presence of aortocoronary bypass graft: Secondary | ICD-10-CM

## 2022-01-18 DIAGNOSIS — I214 Non-ST elevation (NSTEMI) myocardial infarction: Secondary | ICD-10-CM

## 2022-01-19 ENCOUNTER — Other Ambulatory Visit: Payer: Self-pay | Admitting: Family Medicine

## 2022-01-19 ENCOUNTER — Encounter: Payer: Self-pay | Admitting: Family Medicine

## 2022-01-19 DIAGNOSIS — R7989 Other specified abnormal findings of blood chemistry: Secondary | ICD-10-CM

## 2022-01-19 NOTE — Progress Notes (Signed)
See labs 

## 2022-01-20 ENCOUNTER — Encounter (HOSPITAL_COMMUNITY)
Admission: RE | Admit: 2022-01-20 | Discharge: 2022-01-20 | Disposition: A | Discharge status: Home / Self Care | Payer: Medicare Other | Source: Ambulatory Visit | Attending: Cardiology | Admitting: Cardiology

## 2022-01-20 DIAGNOSIS — I214 Non-ST elevation (NSTEMI) myocardial infarction: Secondary | ICD-10-CM

## 2022-01-20 DIAGNOSIS — Z951 Presence of aortocoronary bypass graft: Secondary | ICD-10-CM

## 2022-01-22 ENCOUNTER — Encounter (HOSPITAL_COMMUNITY): Payer: Medicare Other

## 2022-01-25 ENCOUNTER — Encounter (HOSPITAL_COMMUNITY): Payer: Medicare Other

## 2022-01-27 ENCOUNTER — Encounter (HOSPITAL_COMMUNITY)
Admission: RE | Admit: 2022-01-27 | Discharge: 2022-01-27 | Disposition: A | Discharge status: Home / Self Care | Payer: Medicare Other | Source: Ambulatory Visit | Attending: Cardiology | Admitting: Cardiology

## 2022-01-27 DIAGNOSIS — Z951 Presence of aortocoronary bypass graft: Secondary | ICD-10-CM | POA: Diagnosis not present

## 2022-01-27 DIAGNOSIS — I214 Non-ST elevation (NSTEMI) myocardial infarction: Secondary | ICD-10-CM

## 2022-01-28 ENCOUNTER — Ambulatory Visit: Payer: Medicare Other

## 2022-01-29 ENCOUNTER — Other Ambulatory Visit: Payer: Self-pay

## 2022-01-29 ENCOUNTER — Encounter (HOSPITAL_COMMUNITY)
Admission: RE | Admit: 2022-01-29 | Discharge: 2022-01-29 | Disposition: A | Discharge status: Home / Self Care | Payer: Medicare Other | Source: Ambulatory Visit | Attending: Cardiology | Admitting: Cardiology

## 2022-01-29 DIAGNOSIS — Z48812 Encounter for surgical aftercare following surgery on the circulatory system: Secondary | ICD-10-CM | POA: Insufficient documentation

## 2022-01-29 DIAGNOSIS — I252 Old myocardial infarction: Secondary | ICD-10-CM | POA: Insufficient documentation

## 2022-01-29 DIAGNOSIS — I10 Essential (primary) hypertension: Secondary | ICD-10-CM | POA: Insufficient documentation

## 2022-01-29 DIAGNOSIS — Z79899 Other long term (current) drug therapy: Secondary | ICD-10-CM | POA: Diagnosis not present

## 2022-01-29 DIAGNOSIS — Z951 Presence of aortocoronary bypass graft: Secondary | ICD-10-CM | POA: Insufficient documentation

## 2022-01-29 DIAGNOSIS — I214 Non-ST elevation (NSTEMI) myocardial infarction: Secondary | ICD-10-CM

## 2022-02-01 ENCOUNTER — Encounter (HOSPITAL_COMMUNITY)
Admission: RE | Admit: 2022-02-01 | Discharge: 2022-02-01 | Disposition: A | Discharge status: Home / Self Care | Payer: Medicare Other | Source: Ambulatory Visit | Attending: Cardiology | Admitting: Cardiology

## 2022-02-01 DIAGNOSIS — I10 Essential (primary) hypertension: Secondary | ICD-10-CM | POA: Diagnosis not present

## 2022-02-01 DIAGNOSIS — Z48812 Encounter for surgical aftercare following surgery on the circulatory system: Secondary | ICD-10-CM | POA: Diagnosis not present

## 2022-02-01 DIAGNOSIS — Z951 Presence of aortocoronary bypass graft: Secondary | ICD-10-CM | POA: Diagnosis not present

## 2022-02-01 DIAGNOSIS — I214 Non-ST elevation (NSTEMI) myocardial infarction: Secondary | ICD-10-CM

## 2022-02-01 DIAGNOSIS — I252 Old myocardial infarction: Secondary | ICD-10-CM | POA: Diagnosis not present

## 2022-02-01 DIAGNOSIS — Z79899 Other long term (current) drug therapy: Secondary | ICD-10-CM | POA: Diagnosis not present

## 2022-02-01 IMAGING — US US ABDOMEN COMPLETE
1 series · 13 of 25 positions shown · non-contrast
Comparison: None.

CLINICAL DATA: Elevated liver enzymes

EXAM:
ABDOMEN ULTRASOUND COMPLETE

[Series 2: us abdomen complete · 0.19mm/px · 13 of 77 slices shown]
[im 1/77]
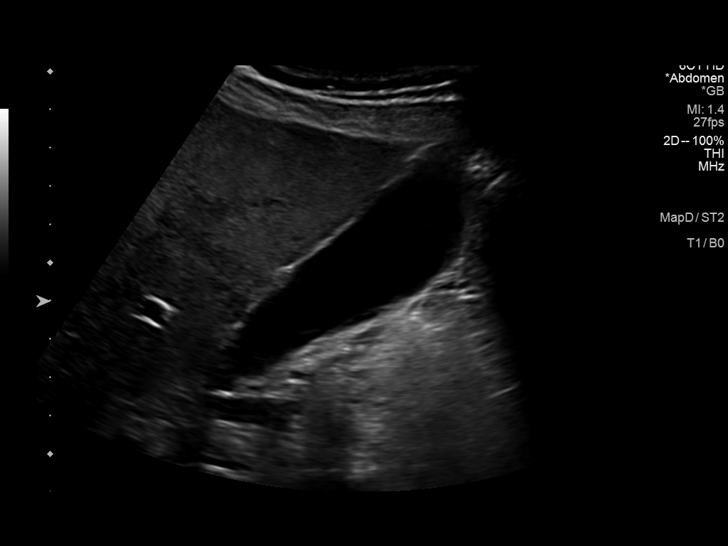
[im 7/77]
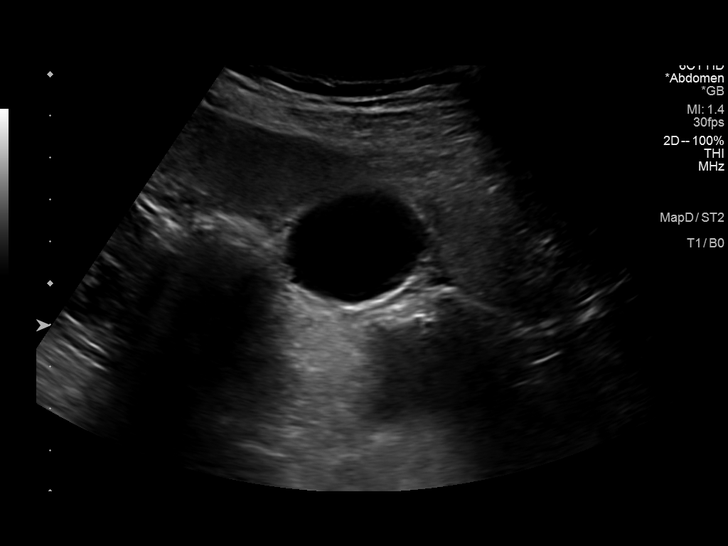
[im 13/77]
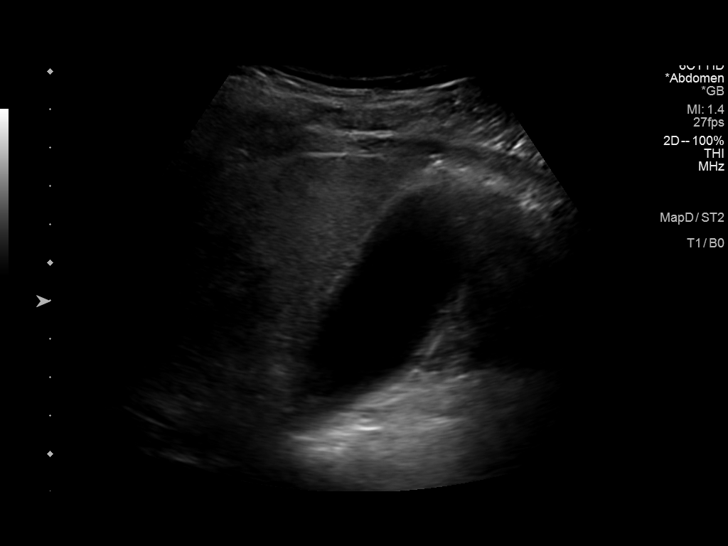
[im 20/77]
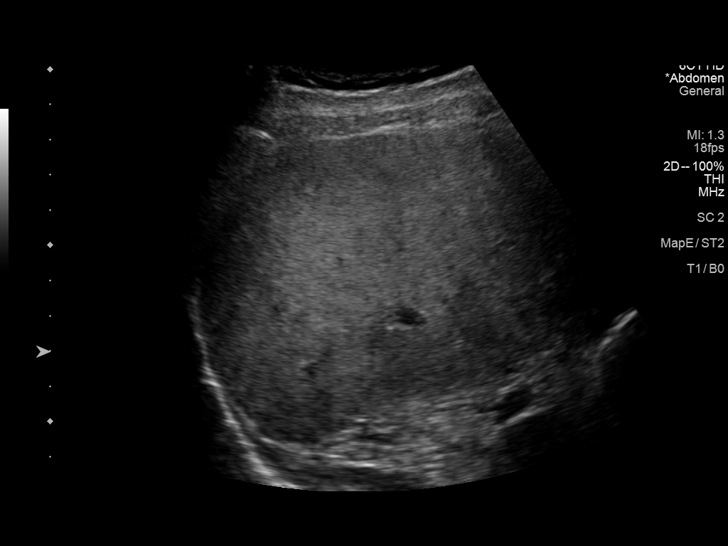
[im 26/77]
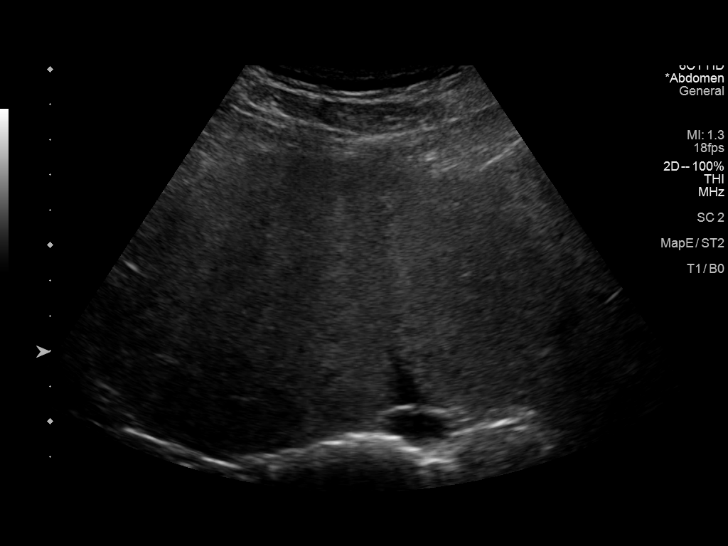
[im 32/77]
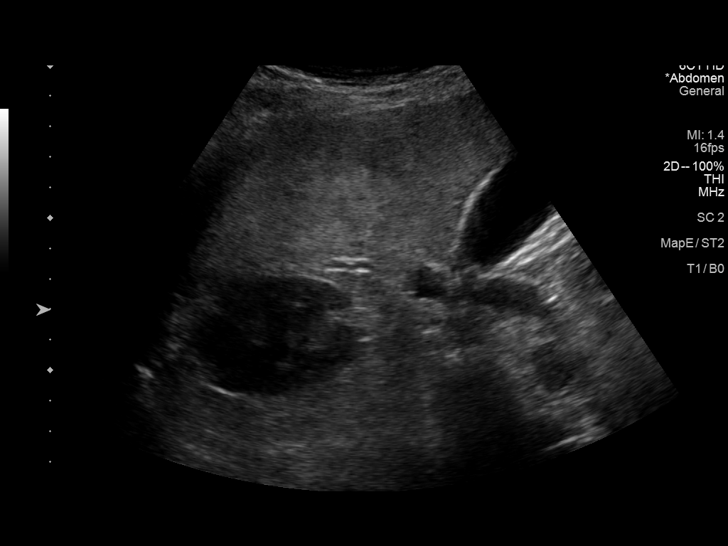
[im 39/77]
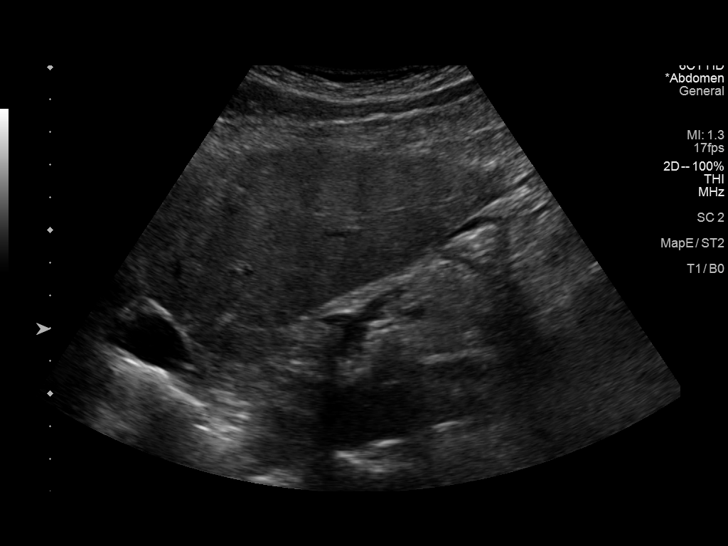
[im 45/77]
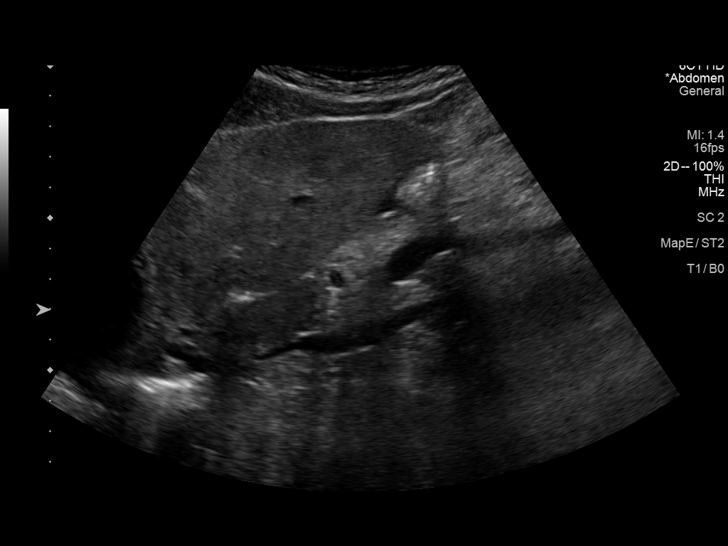
[im 51/77]
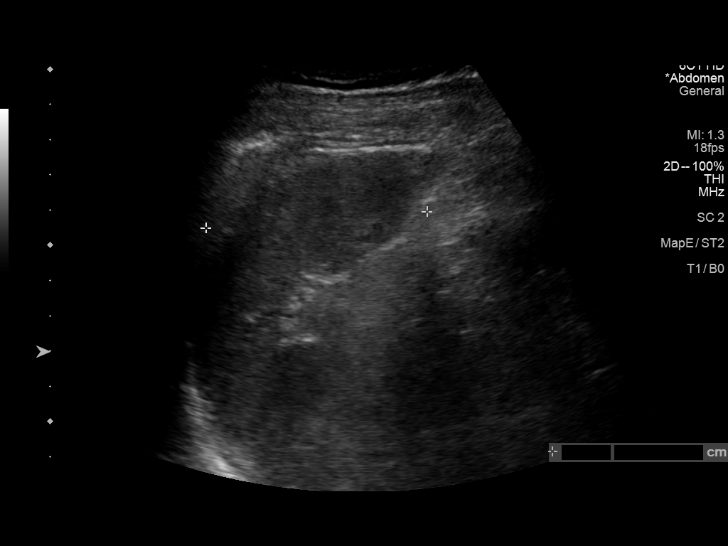
[im 58/77]
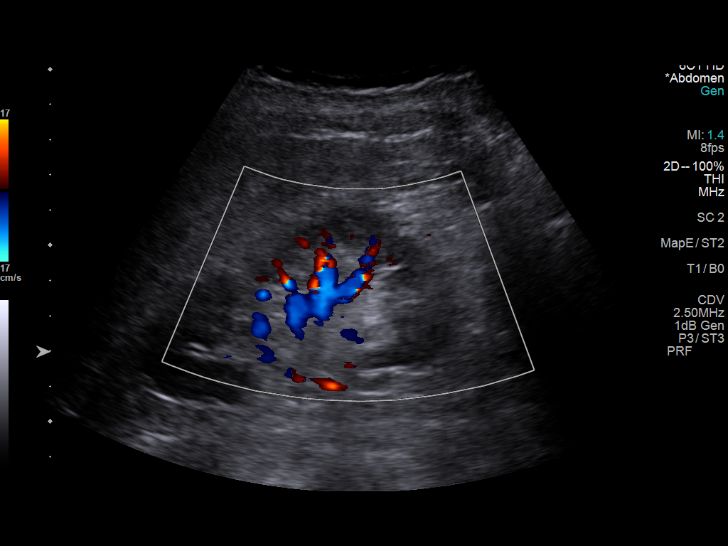
[im 64/77]
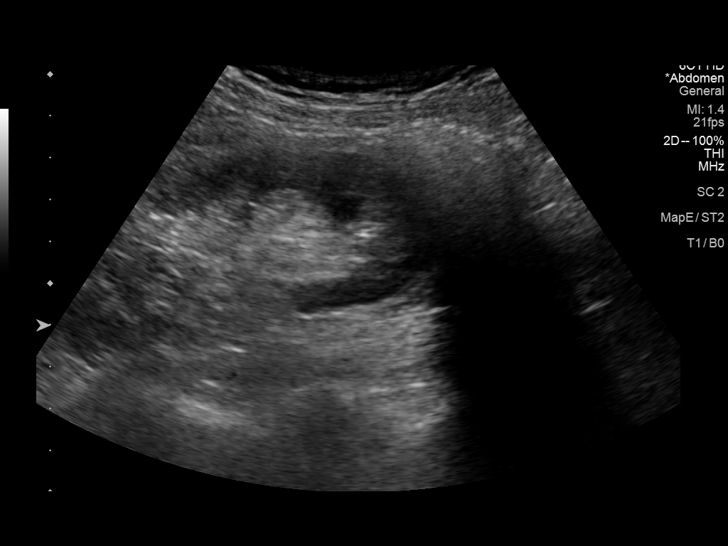
[im 70/77]
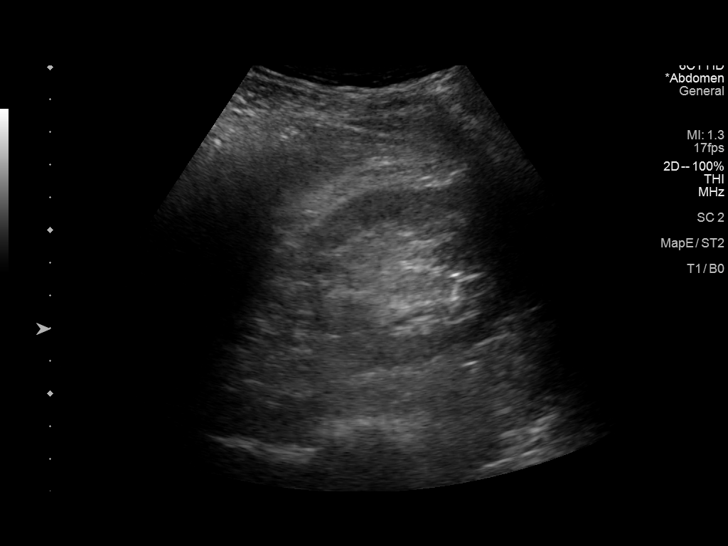
[im 77/77]
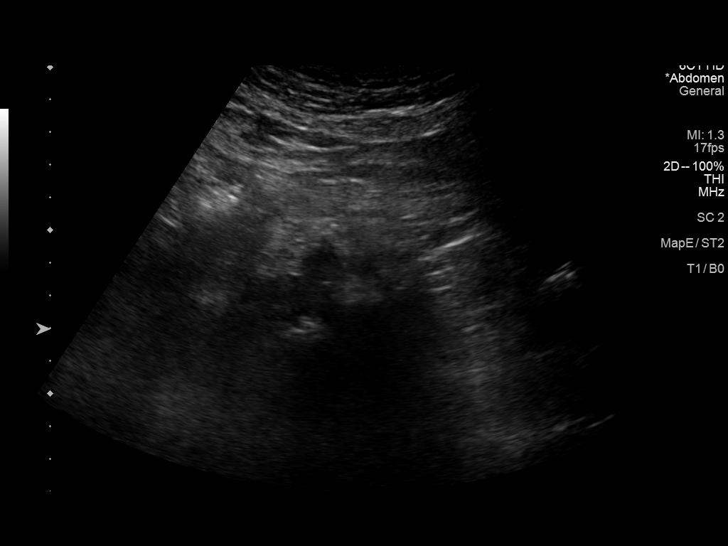

[13 of 25 positions shown; findings below may reference images not displayed]

FINDINGS: Gallbladder: No gallstones or wall thickening visualized. There is
no pericholecystic fluid. No sonographic Murphy sign noted by
sonographer.

Common bile duct: Diameter: 2 mm. No intrahepatic, common hepatic,
or common bile duct dilatation.

Liver: No focal lesion identified. Liver echogenicity overall is
increased. Portal vein is patent on color Doppler imaging with
normal direction of blood flow towards the liver.

IVC: No abnormality visualized.

Pancreas: No pancreatic mass or inflammatory focus.

Spleen: Size and appearance within normal limits.

Right Kidney: Length: 10.7 cm. Echogenicity within normal limits. No
mass or hydronephrosis visualized.

Left Kidney: Length: 10.8 cm. Echogenicity within normal limits. No
mass or hydronephrosis visualized.

Abdominal aorta: No aneurysm visualized.

Other findings: No demonstrable ascites.
IMPRESSION: 1. Increase in liver echogenicity, a finding indicative of hepatic
steatosis. No focal liver lesions are evident. Note that the
sensitivity of ultrasound for detection of focal liver lesions is
somewhat diminished in this circumstance.

2.  Study otherwise unremarkable.

## 2022-02-01 NOTE — Progress Notes (Signed)
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 (Patient not taking: Reported on 01/14/2022), Disp: 30 tablet, Rfl: 0   metoprolol tartrate (LOPRESSOR) 25 MG tablet, Take 0.5 tablets (12.5 mg total) by mouth 2 (two) times daily., Disp: 60 tablet, Rfl: 2   Multiple Vitamin (MULTIVITAMIN) tablet, Take 1 tablet by mouth daily. Unknown strength, Disp: , Rfl:    pantoprazole (PROTONIX) 40 MG tablet, TAKE 1 TABLET BY MOUTH EVERY DAY (Patient not taking: Reported on 01/14/2022), Disp: 90 tablet, Rfl: 3   triamcinolone cream (KENALOG) 0.1 %, Apply 1 Application topically daily as needed (For rash)., Disp: , Rfl:    triamcinolone ointment (KENALOG) 0.1 %, Apply 1 Application topically daily as needed (For rash)., Disp: , Rfl:   Past Medical History: Past Medical History:  Diagnosis Date   Atypical chest pain 07/13/2019   Bilateral hearing loss 08/24/2019   Cataract     Cataract    Chronic kidney disease    kidney stones   Chronic pain of left knee 11/28/2019   COLONIC POLYPS, HX OF 02/12/2009   Coronary artery disease    COVID-19 01/2020   Dyslipidemia 11/21/2007   Qualifier: Diagnosis of  By: Burnice Logan  MD, Doretha Sou    Elevated LFTs 09/02/2018   Essential hypertension 04/07/2016   Treatment initiated February 2018   Follow-up examination after eye surgery 07/19/2019   Healthcare maintenance 08/22/2018   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/09/2017   HYPERLIPIDEMIA 11/21/2007   Hypertension    Hypertriglyceridemia 08/15/2019   Left epiretinal membrane 07/19/2019   Need for influenza vaccination 12/22/2017   Need for shingles vaccine 09/02/2018   NEPHROLITHIASIS, HX OF 01/17/2007   OSTEOARTHRITIS 01/17/2007   Osteoarthritis 01/17/2007   Qualifier: Diagnosis of  By: Burnice Logan  MD, Doretha Sou    Posterior vitreous detachment of left eye 07/19/2019   Right epiretinal membrane 07/19/2019   Steatosis of liver 08/24/2019   Subacromial impingement 01/16/2019    Tobacco Use: Social History   Tobacco Use  Smoking Status Never  Smokeless Tobacco Never    Labs: Review Flowsheet  More data exists      Latest Ref Rng & Units 06/24/2021 09/28/2021 09/29/2021 09/30/2021 01/14/2022  Labs for ITP Cardiac and Pulmonary Rehab  Cholestrol 0 - 200 mg/dL 194  146  - - 114   LDL (calc) 0 - 99 mg/dL - 80  - - -  Direct LDL mg/dL 121.0  - - - 49.0   HDL-C >39.00 mg/dL 42.60  40  - - 36.90   Trlycerides 0.0 - 149.0 mg/dL 219.0  132  - - 299.0   Hemoglobin A1c 4.8 - 5.6 % - - 4.7  - -  PH, Arterial 7.35 - 7.45 - - 7.42  7.320  7.327  7.455  7.420  7.415  7.404  7.386  -  PCO2 arterial 32 - 48 mmHg - - 33  42.9  42.3  34.6  38.2  39.9  41.2  40.0  -  Bicarbonate 20.0 - 28.0 mmol/L - - 21.4  22.3  22.4  24.6  24.8  25.6  31.2  25.7  24.0  -  TCO2 22 - 32 mmol/L - - - '24  24  26  26  25  25  27  25  '$ 33  '27  24  24  25  '$ -  Acid-base deficit 0.0 - 2.0 mmol/L - -  2.3  4.0  4.0  1.0  -  O2 Saturation % - - 99.2  99  99  100  100  100  77  100  100  -    Capillary Blood Glucose: Lab Results  Component Value Date   GLUCAP 116 (H) 10/03/2021   GLUCAP 103 (H) 10/02/2021   GLUCAP 124 (H) 10/02/2021   GLUCAP 98 10/02/2021   GLUCAP 105 (H) 10/02/2021     Exercise Target Goals: Exercise Program Goal: Individual exercise prescription set using results from initial 6 min walk test and THRR while considering  patient's activity barriers and safety.   Exercise Prescription Goal: Initial exercise prescription builds to 30-45 minutes a day of aerobic activity, 2-3 days per week.  Home exercise guidelines will be given to patient during program as part of exercise prescription that the participant will acknowledge.  Activity Barriers & Risk Stratification:  Activity Barriers & Cardiac Risk Stratification - 12/17/21 0928       Activity Barriers & Cardiac Risk Stratification   Activity Barriers Right Knee Replacement;Joint Problems    Cardiac Risk Stratification Moderate             6 Minute Walk:  6 Minute Walk     Row Name 12/17/21 0851         6 Minute Walk   Phase Initial     Distance 1932 feet     Walk Time 6 minutes     # of Rest Breaks 0     MPH 3.66     METS 4.7     RPE 7     Perceived Dyspnea  0     VO2 Peak 16.44     Symptoms No     Resting HR 82 bpm     Resting BP 110/74     Resting Oxygen Saturation  99 %     Exercise Oxygen Saturation  during 6 min walk 100 %     Max Ex. HR 112 bpm     Max Ex. BP 152/64     2 Minute Post BP 130/68              Oxygen Initial Assessment:   Oxygen Re-Evaluation:   Oxygen Discharge (Final Oxygen Re-Evaluation):   Initial Exercise Prescription:  Initial Exercise Prescription - 12/17/21 0900       Date of Initial Exercise RX and Referring Provider   Date 12/17/21    Referring Provider [name], MD  Expected Discharge Date 02/12/22      Recumbant Bike    Level 2.5    RPM 60    Watts 55    Minutes 15    METs 4.7      Arm Ergometer   Level 2    RPM 60    Minutes 15    METs 4.7      Prescription Details   Frequency (times per week) 3    Duration Progress to 30 minutes of continuous aerobic without signs/symptoms of physical distress      Intensity   THRR 40-80% of Max Heartrate 60-119    Ratings of Perceived Exertion 11-13    Perceived Dyspnea 0-4      Progression   Progression Continue progressive overload as per policy without signs/symptoms or physical distress.      Resistance Training   Training Prescription Yes    Weight 4 lbs    Reps 10-15             Perform Capillary Blood Glucose checks as needed.  Exercise Prescription Changes:   Exercise Prescription Changes     Row Name 12/21/21 1024 01/04/22 1037 01/06/22 1022 01/18/22 1017 02/01/22 1020     Response to Exercise   Blood Pressure (Admit) 124/62 124/78 118/62 112/64 122/68   Blood Pressure (Exercise) 146/72 142/64 134/62 142/64 142/66   Blood Pressure (Exit) 120/62 122/60 102/62 138/64 120/64   Heart Rate (Admit) 99 bpm 96 bpm 82 bpm 77 bpm 76 bpm   Heart Rate (Exercise) 143 bpm 127 bpm 118 bpm 122 bpm 124 bpm   Heart Rate (Exit) 95 bpm 104 bpm 88 bpm 86 bpm 85 bpm   Rating of Perceived Exertion (Exercise) '9 10 11 10 13   '$ Symptoms None None None None None   Comments Decreased WL due to elevated heart rate. Will continue to monitor and request THRR increase as needed. Increased WL Arm Ergometer. Increased WL on NuStep and Arm ergometer. Increased weights  from 5 to 6 lbs today. Increased WL on SE and AE today.   Duration Continue with 30 min of aerobic exercise without signs/symptoms of physical distress. Continue with 30 min of aerobic exercise without signs/symptoms of physical distress. Continue with 30 min of aerobic exercise without signs/symptoms of physical distress. Continue with 30 min of aerobic exercise without signs/symptoms of physical  distress. Continue with 30 min of aerobic exercise without signs/symptoms of physical distress.   Intensity THRR unchanged THRR unchanged THRR unchanged THRR unchanged THRR unchanged     Progression   Progression Continue to progress workloads to maintain intensity without signs/symptoms of physical distress. Continue to progress workloads to maintain intensity without signs/symptoms of physical distress. Continue to progress workloads to maintain intensity without signs/symptoms of physical distress. Continue to progress workloads to maintain intensity without signs/symptoms of physical distress. Continue to progress workloads to maintain intensity without signs/symptoms of physical distress.   Average METs 2.9 3.5 4.6 5.2 5.6     Resistance Training   Training Prescription Yes Yes No  Relaxation day, no weights. Yes Yes   Weight 4 lbs 4 lbs -- 6 lbs 8 lbs   Reps 10-15 10-15 -- 10-15 10-15   Time 10 Minutes 10 Minutes -- 10 Minutes 10 Minutes     Interval Training   Interval Training No No No No No     NuStep   Level '3 4 5 5 7   '$ SPM 83 86 107 100 83  Minutes '15 15 15 15 15   '$ METs 2.8 2.5 3.8 5.1 3.5     Arm Ergometer   Level 2.5  Decreased from level 3.0 at 5 minutes due to elevated HR. 4.'5 5 5 7   '$ Watts -- 35 40 40 --   RPM -- 68 -- 75 --   Minutes '15 15 15 15 15   '$ METs 2.9 4.5 5.4 5.3 7.8     Home Exercise Plan   Plans to continue exercise at -- -- [employer] (comment)  Walking, exercise at [employer] (comment)  Walking, exercise at [employer] (comment)  Walking, exercise at [employer]   Frequency -- -- Add 4 additional days to program exercise sessions. Add 4 additional days to program exercise sessions. Add 4 additional days to program exercise sessions.   Initial Home Exercises Provided -- -- 01/06/22 01/06/22 01/06/22            Exercise Comments:   Exercise Comments     Row Name 12/21/21 1132  01/04/22 1107 01/06/22 1101 01/18/22 1046 02/01/22 1104   Exercise Comments Patient tolerate 1st session of exercise well without symptoms. Exceeded target heart rate range and very light workloads. Continue to monitor and request THRR increase as appropriate. Reviewed METs and goals with patient. Increased WL on arm ergometer and tolerated well. Plan to increase WL on NuStep next session. Reviewed home exercise guidelines with patient. Reviewed METs and goals with patient. Patient will increase WLs on both stations next week. Reviewed METs and goals with patient. Patient will increase WLs on SE and AE today and tolerated well.            Exercise Goals and Review:   Exercise Goals     Row Name 12/17/21 0929             Exercise Goals   Increase Physical Activity Yes       Intervention Provide advice, education, support and counseling about physical activity/exercise needs.;Develop an individualized exercise prescription for aerobic and resistive training based on initial evaluation findings, risk stratification, comorbidities and participant's personal goals.       Expected Outcomes Short Term: Attend rehab on a regular basis to increase amount of physical activity.;Long Term: Add in home exercise to make exercise part of routine and to increase amount of physical activity.;Long Term: Exercising regularly at least 3-5 days a week.       Increase Strength and Stamina Yes       Intervention Provide advice, education, support and counseling about physical activity/exercise needs.;Develop an individualized exercise prescription for aerobic and resistive training based on initial evaluation findings, risk stratification, comorbidities and participant's personal goals.       Expected Outcomes Short Term: Increase workloads from initial exercise prescription for resistance, speed, and METs.;Short Term: Perform resistance training exercises routinely during rehab and add in resistance training at  home;Long Term: Improve cardiorespiratory fitness, muscular endurance and strength as measured by increased METs and functional capacity (6MWT)       Able to understand and use rate of perceived exertion (RPE) scale Yes       Intervention Provide education and explanation on how to use RPE scale       Expected Outcomes Short Term: Able to use RPE daily in rehab to express subjective intensity level;Long Term:  Able to use RPE to guide intensity level when exercising independently       Knowledge and understanding  of Target Heart Rate Range (THRR) Yes       Intervention Provide education and explanation of THRR including how the numbers were predicted and where they are located for reference       Expected Outcomes Short Term: Able to state/look up THRR;Short Term: Able to use daily as guideline for intensity in rehab;Long Term: Able to use THRR to govern intensity when exercising independently       Understanding of Exercise Prescription Yes       Intervention Provide education, explanation, and written materials on patient's individual exercise prescription       Expected Outcomes Short Term: Able to explain program exercise prescription;Long Term: Able to explain home exercise prescription to exercise independently                Exercise Goals Re-Evaluation :  Exercise Goals Re-Evaluation     Row Name 12/21/21 1132 01/04/22 1107 01/06/22 1101 01/18/22 1046 02/01/22 1104     Exercise Goal Re-Evaluation   Exercise Goals Review Increase Physical Activity;Able to understand and use rate of perceived exertion (RPE) scale Increase Physical Activity;Able to understand and use rate of perceived exertion (RPE) scale;Increase Strength and Stamina Increase Physical Activity;Able to understand and use rate of perceived exertion (RPE) scale;Increase Strength and Stamina;Understanding of Exercise Prescription;Knowledge and understanding of Target Heart Rate Range (THRR);Able to check pulse independently  Increase Physical Activity;Able to understand and use rate of perceived exertion (RPE) scale;Increase Strength and Stamina;Understanding of Exercise Prescription;Knowledge and understanding of Target Heart Rate Range (THRR);Able to check pulse independently Increase Physical Activity;Able to understand and use rate of perceived exertion (RPE) scale;Increase Strength and Stamina;Understanding of Exercise Prescription;Knowledge and understanding of Target Heart Rate Range (THRR);Able to check pulse independently   Comments Patient able to understand and use RPE scale appropriately. Patient's goal is to get back in shape safely. Patient also wants to gain weight. His goal weight is 165-168 lbs. Patient is currently walking 20-30 minutes daily. Reviewed exercise prescription with patient. Patient is walking daily and is a member at [employer]. Patient knows how to check his pulse. Patient continues to progress well with exercise. Patient states this is the best he's felt in 5 years. Patient would like to increase workloads on NuStep and Arm ergometer next week. Increased WLs on SE and AE today and tolerated well. Patient plans to continue exercise at local gym 5 days/week upon completion of the cardiac rehab program.   Expected Outcomes Progress workloads as tolerated to help increase cardiorespiratory fitness, improve heart rate response. Continue daily exercise routine. Continue to progress workloads as tolerated. Continue daily exercise routine. Continue to progress workloads as tolerated. Continue daily exercise routine. Continue to progress workloads as tolerated. Patient will continue daily exercise routine.            Discharge Exercise Prescription (Final Exercise Prescription Changes):  Exercise Prescription Changes - 02/01/22 1020       Response to Exercise   Blood Pressure (Admit) 122/68    Blood Pressure (Exercise) 142/66    Blood Pressure (Exit) 120/64    Heart Rate (Admit) 76 bpm     Heart Rate (Exercise) 124 bpm    Heart Rate (Exit) 85 bpm    Rating of Perceived Exertion (Exercise) 13    Symptoms None    Comments Increased WL on SE and AE today.    Duration Continue with 30 min of aerobic exercise without signs/symptoms of physical distress.    Intensity THRR unchanged  Progression   Progression Continue to progress workloads to maintain intensity without signs/symptoms of physical distress.    Average METs 5.6      Resistance Training   Training Prescription Yes    Weight 8 lbs    Reps 10-15    Time 10 Minutes      Interval Training   Interval Training No      NuStep   Level 7    SPM 83    Minutes 15    METs 3.5      Arm Ergometer   Level 7    Minutes 15    METs 7.8      Home Exercise Plan   Plans to continue exercise at [employer] (comment)   Walking, exercise at [employer]   Frequency Add 4 additional days to program exercise sessions.    Initial Home Exercises Provided 01/06/22             Nutrition:  Target Goals: Understanding of nutrition guidelines, daily intake of sodium '1500mg'$ , cholesterol '200mg'$ , calories 30% from fat and 7% or less from saturated fats, daily to have 5 or more servings of fruits and vegetables.  Biometrics:  Pre Biometrics - 12/17/21 0815       Pre Biometrics   Waist Circumference 31 inches    Hip Circumference 36.75 inches    Waist to Hip Ratio 0.84 %    Triceps Skinfold 7 mm    % Body Fat 17.8 %    Grip Strength 49 kg    Flexibility 14 in    Single Leg Stand 20.18 seconds              Nutrition Therapy Plan and Nutrition Goals:  Nutrition Therapy & Goals - 01/18/22 1149       Nutrition Therapy   Diet Heart Healthy Diet    Drug/Food Interactions Statins/Certain Fruits      Personal Nutrition Goals   Nutrition Goal Patient to identify strategies for managing cardiovascular risk by attending the weekly [place] education and nutrition courses    Personal Goal #2 Patient  to identify strategies for weight gain of 0.5-2.0# per week.    Personal Goal #3 Patient to identify food sources of saturated fat, trans fat, sodium, and refined carbohyrates    Comments Eduard Clos is up 6.6# since starting with our program. He continues to eat a wide variety of foods with close attention to high fiber intake, reading food labels for sugar, and reading food labels for sodium. He plans to follow-up with his cardiologist at follow-up appointment on 12/5 regarding elevated triglycerides and elevated liver enzymes.      Intervention Plan   Intervention Prescribe, educate and counsel regarding individualized specific dietary modifications aiming towards targeted core components such as weight, hypertension, lipid management, diabetes, heart failure and other comorbidities.;Nutrition handout(s) given to patient.    Expected Outcomes Short Term Goal: Understand basic principles of dietary content, such as calories, fat, sodium, cholesterol and nutrients.;Short Term Goal: A plan has been developed with personal nutrition goals set during dietitian appointment.;Long Term Goal: Adherence to prescribed nutrition plan.             Nutrition Assessments:  Nutrition Assessments - 12/17/21 1607       Rate Your Plate Scores   Pre Score 60            MEDIFICTS Score Key: ?70 Need to make dietary changes  40-70 Heart Healthy Diet ? 40 Therapeutic Level Cholesterol  Diet   Flowsheet Row INTENSIVE CARDIAC REHAB ORIENT from 12/17/2021 in [facility] for Heart, Vascular, & Lung Health  Picture Your Plate Total Score on Admission 60      Picture Your Plate Scores: <21 Unhealthy dietary pattern with much room for improvement. 41-50 Dietary pattern unlikely to meet recommendations for good health and room for improvement. 51-60 More healthful dietary pattern, with some room for improvement.  >60 Healthy dietary pattern, although there may be some specific  behaviors that could be improved.    Nutrition Goals Re-Evaluation:  Nutrition Goals Re-Evaluation     [place] Name 12/21/21 1100 01/18/22 1149           Goals   Current Weight 141 lb 12.1 oz (64.3 kg) 149 lb 11.1 oz (67.9 kg)      Comment A1c 4.7, Triglyerides WNL (hx of hypertriglyeridemia), HDL 40, AST 71, ALT 57 Triglycerides 299, HDL 36.9, AST 117, ALT 89      Expected Outcome Charles reports eating three meals per day in addition to two Boost daily (480 kcals, 20g protein). He reports significant unwanted weight loss and that his usual body weight is 165-170#; he reports that weight has stabilized recently and he is no longer losing weight but maintaining. He reports significant muscle mass lost including in his upper body and arms. His estimated calories needs 2250-2655 (30-35kcals/kg UBW) and 90-113g protein (1.2-1.5g/kg UBW). Goals in action. Eduard Clos continues to attend the [place] education series. Eduard Clos is up 6.6# since starting with our program. He continues to eat a wide variety of foods with close attention to high fiber intake, reading food labels for sugar, and reading food labels for sodium. He does enjoy a wide variety of game meats and reports weekly alcohol intake. He plans to follow-up with his cardiologist at follow-up appointment on 12/5 regarding elevated triglycerides and elevated liver enzymes.               Nutrition Goals Re-Evaluation:  Nutrition Goals Re-Evaluation     [place] Name 12/21/21 1100 01/18/22 1149           Goals   Current Weight 141 lb 12.1 oz (64.3 kg) 149 lb 11.1 oz (67.9 kg)      Comment A1c 4.7, Triglyerides WNL (hx of hypertriglyeridemia), HDL 40, AST 71, ALT 57 Triglycerides 299, HDL 36.9, AST 117, ALT 89      Expected Outcome Charles reports eating three meals per day in addition to two Boost daily (480 kcals, 20g protein). He reports significant unwanted weight loss and that his usual body weight is 165-170#; he reports that weight has  stabilized recently and he is no longer losing weight but maintaining. He reports significant muscle mass lost including in his upper body and arms. His estimated calories needs 2250-2655 (30-35kcals/kg UBW) and 90-113g protein (1.2-1.5g/kg UBW). Goals in action. Eduard Clos continues to attend the [place] education series. Eduard Clos is up 6.6# since starting with our program. He continues to eat a wide variety of foods with close attention to high fiber intake, reading food labels for sugar, and reading food labels for sodium. He does enjoy a wide variety of game meats and reports weekly alcohol intake. He plans to follow-up with his cardiologist at follow-up appointment on 12/5 regarding elevated triglycerides and elevated liver enzymes.               Nutrition Goals Discharge (Final Nutrition Goals Re-Evaluation):  Nutrition Goals Re-Evaluation - 01/18/22 1149  Goals   Current Weight 149 lb 11.1 oz (67.9 kg)    Comment Triglycerides 299, HDL 36.9, AST 117, ALT 89    Expected Outcome Goals in action. Eduard Clos continues to attend the [place] education series. Eduard Clos is up 6.6# since starting with our program. He continues to eat a wide variety of foods with close attention to high fiber intake, reading food labels for sugar, and reading food labels for sodium. He does enjoy a wide variety of game meats and reports weekly alcohol intake. He plans to follow-up with his cardiologist at follow-up appointment on 12/5 regarding elevated triglycerides and elevated liver enzymes.             Psychosocial: Target Goals: Acknowledge presence or absence of significant depression and/or stress, maximize coping skills, provide positive support system. Participant is able to verbalize types and ability to use techniques and skills needed for reducing stress and depression.  Initial Review & Psychosocial Screening:  Initial Psych Review & Screening - 12/17/21 0848       Initial Review   Current  issues with None Identified      Family Dynamics   Good Support System? Yes   Eduard Clos has his wife and two children for support     Barriers   Psychosocial barriers to participate in program There are no identifiable barriers or psychosocial needs.      Screening Interventions   Interventions Encouraged to exercise             Quality of Life Scores:  Quality of Life - 12/17/21 0953       Quality of Life   Select Quality of Life      Quality of Life Scores   Health/Function Pre 28.7 %    Socioeconomic Pre 28.63 %    Psych/Spiritual Pre 29.29 %    Family Pre 30 %    GLOBAL Pre 28.99 %            Scores of 19 and below usually indicate a poorer quality of life in these areas.  A difference of  2-3 points is a clinically meaningful difference.  A difference of 2-3 points in the total score of the Quality of Life Index has been associated with significant improvement in overall quality of life, self-image, physical symptoms, and general health in studies assessing change in quality of life.  PHQ-9: Review Flowsheet  More data exists      01/14/2022 12/17/2021 06/24/2021 05/13/2021 01/27/2021  Depression screen PHQ 2/9  Decreased Interest 0 0 0 0 0 0  Down, Depressed, Hopeless 0 0 0 0 0 0  PHQ - 2 Score 0 0 0 0 0 0  Altered sleeping 0 - 0 0 -  Tired, decreased energy 0 - 0 0 -  Change in appetite 0 - 0 0 -  Feeling bad or failure about yourself  0 - 0 0 -  Trouble concentrating 0 - 0 0 -  Moving slowly or fidgety/restless 0 - 0 0 -  Suicidal thoughts 0 - 0 0 -  PHQ-9 Score 0 - 0 0 -  Difficult doing work/chores - - Not difficult at all - -   Interpretation of Total Score  Total Score Depression Severity:  1-4 = Minimal depression, 5-9 = Mild depression, 10-14 = Moderate depression, 15-19 = Moderately severe depression, 20-27 = Severe depression   Psychosocial Evaluation and Intervention:   Psychosocial Re-Evaluation:  Psychosocial Re-Evaluation     Row Name  12/22/21 [phone]  01/05/22 1706 01/29/22 1640         Psychosocial Re-Evaluation   Current issues with None Identified None Identified None Identified     Interventions Encouraged to attend Cardiac Rehabilitation for the exercise Encouraged to attend Cardiac Rehabilitation for the exercise Encouraged to attend Cardiac Rehabilitation for the exercise     Continue Psychosocial Services  No Follow up required No Follow up required No Follow up required              Psychosocial Discharge (Final Psychosocial Re-Evaluation):  Psychosocial Re-Evaluation - 01/29/22 1640       Psychosocial Re-Evaluation   Current issues with None Identified    Interventions Encouraged to attend Cardiac Rehabilitation for the exercise    Continue Psychosocial Services  No Follow up required             Vocational Rehabilitation: Provide vocational rehab assistance to qualifying candidates.   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 Participant will state understanding/return demonstration of topics presented.    Education     Row Name 12/21/21 1300     Education   Cardiac Education Topics Pritikin   Environmental consultant Psychosocial   Psychosocial Workshop Recognizing and Reducing Stress   Instruction Review Code 1- Verbalizes Understanding   Class Start Time 1140   Class Stop Time 1230   Class Time Calculation (min) 50 min    [place] Name 12/23/21 1300     Education   Cardiac Education Topics Pritikin   Financial trader    Weekly Topic Fast and Healthy Breakfasts   Instruction Review Code 1- Verbalizes Understanding   Class Start Time 1138   Class Stop Time 1225   Class Time Calculation (min) 47 min    [place] Name 01/04/22 1200     Education   Cardiac Education Topics Pritikin   IT sales professional Nutrition   Nutrition Workshop Label Reading   Instruction Review Code 1- Verbalizes Understanding   Class Start Time 1140   Class Stop Time 1215   Class Time Calculation (min) 35 min    [place] Name 01/06/22 1200     Education   Cardiac Education Topics Pritikin   Financial trader   Weekly Topic Tasty Appetizers and Snacks   Instruction Review Code 1- Verbalizes Understanding   Class Start Time 1145   Class Stop Time 1230   Class Time Calculation (min) 45 min    [employer] Name 01/11/22 1200     Education   Cardiac Education Topics Pritikin   Environmental consultant Psychosocial   Psychosocial Workshop Healthy Sleep for a Healthy Heart   Instruction Review Code 1- Verbalizes Understanding   Class Start Time 1155   Class Stop Time 1245   Class Time Calculation (  min) 50 min    [place] Name 01/13/22 1300     Education   Cardiac Education Topics [place]   Educator Dietitian   Weekly Topic [employer] Desserts   Instruction Review Code 1- Verbalizes Understanding   Class Start Time 1145   Class Stop Time 1226   Class Time Calculation (min) 41 min    [place] Name 01/15/22 1300     Education   Cardiac Education Topics Pritikin   Lexicographer Nutrition   Nutrition Calorie Density   Instruction Review Code 1- Verbalizes Understanding   Class Start Time 1145   Class Stop Time 1230   Class Time Calculation (min) 45 min    [place] Name 01/20/22 1300     Education    Cardiac Education Topics Pritikin   Barista - Meals in a Snap  Stuffed Butternut Squash   Instruction Review Code 1- Verbalizes Understanding   Class Start Time 1145   Class Stop Time 1230   Class Time Calculation (min) 45 min    [place] Name 01/29/22 1300     Education   Cardiac Education Topics Pritikin   Tax inspector General Education   General Education Hypertension and Heart Disease   Instruction Review Code 1- Verbalizes Understanding   Class Start Time 1135   Class Stop Time 1215   Class Time Calculation (min) 40 min    [place] Name 02/01/22 1300     Education   Cardiac Education Topics Pritikin   Academic librarian Psychosocial   Psychosocial Other  From Head to Heart: The Power of a Healthy Outlook   Instruction Review Code 1- Verbalizes Understanding   Class Start Time 1138   Class Stop Time 1240   Class Time Calculation (min) 62 min            Core Videos: Exercise    Move It!  Clinical staff conducted group or individual video education with verbal and written material and guidebook.  Patient learns the recommended Pritikin exercise program. Exercise with the goal of living a long, healthy life. Some of the health benefits of exercise include controlled diabetes, healthier blood pressure levels, improved cholesterol levels, improved heart and lung capacity, improved sleep, and better body composition. Everyone should speak with their doctor before starting or changing an exercise routine.  Biomechanical Limitations Clinical staff conducted group or individual video education with verbal and written material and guidebook.  Patient learns how biomechanical limitations can impact exercise and how we can mitigate and possibly overcome limitations to have an impactful and  balanced exercise routine.  Body Composition Clinical staff conducted group or individual video education with verbal and written material and guidebook.  Patient learns that body composition (ratio of muscle mass to fat mass) is a key component to assessing overall fitness, rather than body weight alone. Increased fat mass, especially visceral belly fat, can put [place] at increased risk for metabolic syndrome, type 2 diabetes, heart disease, and even death. It is recommended to combine diet and exercise (cardiovascular and resistance training) to improve your body composition. Seek guidance from your physician  and exercise physiologist before implementing an exercise routine.  Exercise Action Plan Clinical staff conducted group or individual video education with verbal and written material and guidebook.  Patient learns the recommended strategies to achieve and enjoy long-term exercise adherence, including variety, self-motivation, self-efficacy, and positive decision making. Benefits of exercise include fitness, good health, weight management, more energy, better sleep, less stress, and overall well-being.  Medical   Heart Disease Risk Reduction Clinical staff conducted group or individual video education with verbal and written material and guidebook.  Patient learns our heart is our most vital organ as it circulates oxygen, nutrients, white blood cells, and hormones throughout the entire body, and carries waste away. Data supports a plant-based eating plan like the Pritikin Program for its effectiveness in slowing progression of and reversing heart disease. The video provides a number of recommendations to address heart disease.   Metabolic Syndrome and Belly Fat  Clinical staff conducted group or individual video education with verbal and written material and guidebook.  Patient learns what metabolic syndrome is, how it leads to heart disease, and how one can reverse it and keep it from coming  back. You have metabolic syndrome if you have 3 of the following 5 criteria: abdominal obesity, high blood pressure, high triglycerides, low HDL cholesterol, and high blood sugar.  Hypertension and Heart Disease Clinical staff conducted group or individual video education with verbal and written material and guidebook.  Patient learns that high blood pressure, or hypertension, is very common in the [place]. Hypertension is largely due to excessive salt intake, but other important risk factors include being overweight, physical inactivity, drinking too much alcohol, smoking, and not eating enough potassium from fruits and vegetables. High blood pressure is a leading risk factor for heart attack, stroke, congestive heart failure, dementia, kidney failure, and premature death. Long-term effects of excessive salt intake include stiffening of the arteries and thickening of heart muscle and organ damage. Recommendations include ways to reduce hypertension and the risk of heart disease.  Diseases of Our Time - Focusing on Diabetes Clinical staff conducted group or individual video education with verbal and written material and guidebook.  Patient learns why the best way to stop diseases of our time is prevention, through food and other lifestyle changes. Medicine (such as prescription pills and surgeries) is often only a Band-Aid on the problem, not a long-term solution. Most common diseases of our time include obesity, type 2 diabetes, hypertension, heart disease, and cancer. The Pritikin Program is recommended and has been proven to help reduce, reverse, and/or prevent the damaging effects of metabolic syndrome.  Nutrition   Overview of the Pritikin Eating Plan  Clinical staff conducted group or individual video education with verbal and written material and guidebook.  Patient learns about the [place] for disease risk reduction. The [place] emphasizes a wide variety of  unrefined, minimally-processed carbohydrates, like fruits, vegetables, whole grains, and legumes. Go, Caution, and Stop food choices are explained. Plant-based and lean animal proteins are emphasized. Rationale provided for low sodium intake for blood pressure control, low added sugars for blood sugar stabilization, and low added fats and oils for coronary artery disease risk reduction and weight management.  Calorie Density  Clinical staff conducted group or individual video education with verbal and written material and guidebook.  Patient learns about calorie density and how it impacts the Pritikin Eating Plan. Knowing the characteristics of the food you choose will help you decide whether those foods will  lead to weight gain or weight loss, and whether you want to consume more or less of them. Weight loss is usually a side effect of the Pritikin Eating Plan because of its focus on low calorie-dense foods.  Label Reading  Clinical staff conducted group or individual video education with verbal and written material and guidebook.  Patient learns about the Pritikin recommended label reading guidelines and corresponding recommendations regarding calorie density, added sugars, sodium content, and whole grains.  Dining Out - Part 1  Clinical staff conducted group or individual video education with verbal and written material and guidebook.  Patient learns that restaurant meals can be sabotaging because they can be so high in calories, fat, sodium, and/or sugar. Patient learns recommended strategies on how to positively address this and avoid unhealthy pitfalls.  Facts on Fats  Clinical staff conducted group or individual video education with verbal and written material and guidebook.  Patient learns that lifestyle modifications can be just as effective, if not more so, as many medications for lowering your risk of heart disease. A Pritikin lifestyle can help to reduce your risk of inflammation and  atherosclerosis (cholesterol build-up, or plaque, in the artery walls). Lifestyle interventions such as dietary choices and physical activity address the cause of atherosclerosis. A review of the types of fats and their impact on blood cholesterol levels, along with dietary recommendations to reduce fat intake is also included.  Nutrition Action Plan  Clinical staff conducted group or individual video education with verbal and written material and guidebook.  Patient learns how to incorporate Pritikin recommendations into their lifestyle. Recommendations include planning and keeping personal health goals in mind as an important part of their success.  Healthy Mind-Set    Healthy Minds, Bodies, Hearts  Clinical staff conducted group or individual video education with verbal and written material and guidebook.  Patient learns how to identify when they are stressed. Video will discuss the impact of that stress, as well as the many benefits of stress management. Patient will also be introduced to stress management techniques. The way we think, act, and feel has an impact on our hearts.  How Our Thoughts Can Heal Our Hearts  Clinical staff conducted group or individual video education with verbal and written material and guidebook.  Patient learns that negative thoughts can cause depression and anxiety. This can result in negative lifestyle behavior and serious health problems. Cognitive behavioral therapy is an effective method to help control our thoughts in order to change and improve our emotional outlook.  Additional Videos:  Exercise    Improving Performance  Clinical staff conducted group or individual video education with verbal and written material and guidebook.  Patient learns to use a non-linear approach by alternating intensity levels and lengths of time spent exercising to help burn more calories and lose more body fat. Cardiovascular exercise helps improve heart health, metabolism,  hormonal balance, blood sugar control, and recovery from fatigue. Resistance training improves strength, endurance, balance, coordination, reaction time, metabolism, and muscle mass. Flexibility exercise improves circulation, posture, and balance. Seek guidance from your physician and exercise physiologist before implementing an exercise routine and learn your capabilities and proper form for all exercise.  Introduction to Yoga  Clinical staff conducted group or individual video education with verbal and written material and guidebook.  Patient learns about yoga, a discipline of the coming together of mind, breath, and body. The benefits of yoga include improved flexibility, improved range of motion, better posture and core strength, increased  lung function, weight loss, and positive self-image. Yoga's heart health benefits include lowered blood pressure, healthier heart rate, decreased cholesterol and triglyceride levels, improved immune function, and reduced stress. Seek guidance from your physician and exercise physiologist before implementing an exercise routine and learn your capabilities and proper form for all exercise.  Medical   Aging: Enhancing Your Quality of Life  Clinical staff conducted group or individual video education with verbal and written material and guidebook.  Patient learns key strategies and recommendations to stay in good physical health and enhance quality of life, such as prevention strategies, having an advocate, securing a [place], and keeping a list of medications and system for tracking them. It also discusses how to avoid risk for bone loss.  Biology of Weight Control  Clinical staff conducted group or individual video education with verbal and written material and guidebook.  Patient learns that weight gain occurs because we consume more calories than we burn (eating more, moving less). Even if your body weight is normal, you may have  higher ratios of fat compared to muscle mass. Too much body fat puts you at increased risk for cardiovascular disease, heart attack, stroke, type 2 diabetes, and obesity-related cancers. In addition to exercise, following the [place] can help reduce your risk.  Decoding Lab Results  Clinical staff conducted group or individual video education with verbal and written material and guidebook.  Patient learns that lab test reflects one measurement whose values change over time and are influenced by many factors, including medication, stress, sleep, exercise, food, hydration, pre-existing medical conditions, and more. It is recommended to use the knowledge from this video to become more involved with your lab results and evaluate your numbers to speak with your doctor.   Diseases of Our Time - Overview  Clinical staff conducted group or individual video education with verbal and written material and guidebook.  Patient learns that according to the CDC, 50% to 70% of chronic diseases (such as obesity, type 2 diabetes, elevated lipids, hypertension, and heart disease) are avoidable through lifestyle improvements including healthier food choices, listening to satiety cues, and increased physical activity.  Sleep Disorders Clinical staff conducted group or individual video education with verbal and written material and guidebook.  Patient learns how good quality and duration of sleep are important to overall health and well-being. Patient also learns about sleep disorders and how they impact health along with recommendations to address them, including discussing with a physician.  Nutrition  Dining Out - Part 2 Clinical staff conducted group or individual video education with verbal and written material and guidebook.  Patient learns how to plan ahead and communicate in order to maximize their dining experience in a healthy and nutritious manner. Included are recommended food choices based on  the type of restaurant the patient is visiting.   Fueling a Best boy conducted group or individual video education with verbal and written material and guidebook.  There is a strong connection between our food choices and our health. Diseases like obesity and type 2 diabetes are very prevalent and are in large-part due to lifestyle choices. The Pritikin Eating Plan provides plenty of food and hunger-curbing satisfaction. It is easy to follow, affordable, and helps reduce health risks.  Menu Workshop  Clinical staff conducted group or individual video education with verbal and written material and guidebook.  Patient learns that restaurant meals can sabotage health goals because they are often packed  with calories, fat, sodium, and sugar. Recommendations include strategies to plan ahead and to communicate with the manager, chef, or server to help order a healthier meal.  Planning Your Eating Strategy  Clinical staff conducted group or individual video education with verbal and written material and guidebook.  Patient learns about the [place] and its benefit of reducing the risk of disease. The [place] does not focus on calories. Instead, it emphasizes high-quality, nutrient-rich foods. By knowing the characteristics of the foods, we choose, we can determine their calorie density and make informed decisions.  Targeting Your Nutrition Priorities  Clinical staff conducted group or individual video education with verbal and written material and guidebook.  Patient learns that lifestyle habits have a tremendous impact on disease risk and progression. This video provides eating and physical activity recommendations based on your personal health goals, such as reducing LDL cholesterol, losing weight, preventing or controlling type 2 diabetes, and reducing high blood pressure.  Vitamins and Minerals  Clinical staff conducted group or individual video education  with verbal and written material and guidebook.  Patient learns different ways to obtain key vitamins and minerals, including through a recommended healthy diet. It is important to discuss all supplements you take with your doctor.   Healthy Mind-Set    Smoking Cessation  Clinical staff conducted group or individual video education with verbal and written material and guidebook.  Patient learns that cigarette smoking and tobacco addiction pose a serious health risk which affects millions of people. Stopping smoking will significantly reduce the risk of heart disease, lung disease, and many forms of cancer. Recommended strategies for quitting are covered, including working with your doctor to develop a successful plan.  Culinary   Becoming a Financial trader conducted group or individual video education with verbal and written material and guidebook.  Patient learns that cooking at home can be healthy, cost-effective, quick, and puts them in control. Keys to cooking healthy recipes will include looking at your recipe, assessing your equipment needs, planning ahead, making it simple, choosing cost-effective seasonal ingredients, and limiting the use of added fats, salts, and sugars.  Cooking - Breakfast and Snacks  Clinical staff conducted group or individual video education with verbal and written material and guidebook.  Patient learns how important breakfast is to satiety and nutrition through the entire day. Recommendations include key foods to eat during breakfast to help stabilize blood sugar levels and to prevent overeating at meals later in the day. Planning ahead is also a key component.  Cooking - Human resources officer conducted group or individual video education with verbal and written material and guidebook.  Patient learns eating strategies to improve overall health, including an approach to cook more at home. Recommendations include thinking of animal protein  as a side on your plate rather than center stage and focusing instead on lower calorie dense options like vegetables, fruits, whole grains, and plant-based proteins, such as beans. Making sauces in large quantities to freeze for later and leaving the skin on your vegetables are also recommended to maximize your experience.  Cooking - Healthy Salads and Dressing Clinical staff conducted group or individual video education with verbal and written material and guidebook.  Patient learns that vegetables, fruits, whole grains, and legumes are the foundations of the [place]. Recommendations include how to incorporate each of these in flavorful and healthy salads, and how to create homemade salad dressings. Proper handling of ingredients is  also covered. Cooking - Soups and [employer] - Soups and Desserts Clinical staff conducted group or individual video education with verbal and written material and guidebook.  Patient learns that Pritikin soups and desserts make for easy, nutritious, and delicious snacks and meal components that are low in sodium, fat, sugar, and calorie density, while high in vitamins, minerals, and filling fiber. Recommendations include simple and healthy ideas for soups and desserts.   Overview     The Pritikin Solution Program Overview Clinical staff conducted group or individual video education with verbal and written material and guidebook.  Patient learns that the results of the [place] Program have been documented in more than 100 articles published in peer-reviewed journals, and the benefits include reducing risk factors for (and, in some cases, even reversing) high cholesterol, high blood pressure, type 2 diabetes, obesity, and more! An overview of the three key pillars of the Pritikin Program will be covered: eating well, doing regular exercise, and having a healthy mind-set.  WORKSHOPS  Exercise: Exercise Basics: Building Your Action Plan Clinical staff  led group instruction and group discussion with PowerPoint presentation and patient guidebook. To enhance the learning environment the use of posters, models and videos may be added. At the conclusion of this workshop, patients will comprehend the difference between physical activity and exercise, as well as the benefits of incorporating both, into their routine. Patients will understand the FITT (Frequency, Intensity, Time, and Type) principle and how to use it to build an exercise action plan. In addition, safety concerns and other considerations for exercise and cardiac rehab will be addressed by the presenter. The purpose of this lesson is to promote a comprehensive and effective weekly exercise routine in order to improve patients' overall level of fitness.   Managing Heart Disease: Your Path to a Healthier Heart Clinical staff led group instruction and group discussion with PowerPoint presentation and patient guidebook. To enhance the learning environment the use of posters, models and videos may be added.At the conclusion of this workshop, patients will understand the anatomy and physiology of the heart. Additionally, they will understand how Pritikin's three pillars impact the risk factors, the progression, and the management of heart disease.  The purpose of this lesson is to provide a high-level overview of the heart, heart disease, and how the Pritikin lifestyle positively impacts risk factors.  Exercise Biomechanics Clinical staff led group instruction and group discussion with PowerPoint presentation and patient guidebook. To enhance the learning environment the use of posters, models and videos may be added. Patients will learn how the structural parts of their bodies function and how these functions impact their daily activities, movement, and exercise. Patients will learn how to promote a neutral spine, learn how to manage pain, and identify ways to improve their physical movement  in order to promote healthy living. The purpose of this lesson is to expose patients to common physical limitations that impact physical activity. Participants will learn practical ways to adapt and manage aches and pains, and to minimize their effect on regular exercise. Patients will learn how to maintain good posture while sitting, walking, and lifting.  Balance Training and Fall Prevention  Clinical staff led group instruction and group discussion with PowerPoint presentation and patient guidebook. To enhance the learning environment the use of posters, models and videos may be added. At the conclusion of this workshop, patients will understand the importance of their sensorimotor skills (vision, proprioception, and the vestibular system) in maintaining their ability to balance  as they age. Patients will apply a variety of balancing exercises that are appropriate for their current level of function. Patients will understand the common causes for poor balance, possible solutions to these problems, and ways to modify their physical environment in order to minimize their fall risk. The purpose of this lesson is to teach patients about the importance of maintaining balance as they age and ways to minimize their risk of falling.  WORKSHOPS   Nutrition:  Fueling a Scientist, research (physical sciences) led group instruction and group discussion with PowerPoint presentation and patient guidebook. To enhance the learning environment the use of posters, models and videos may be added. Patients will review the foundational principles of the [place] and understand what constitutes a serving size in each of the food groups. Patients will also learn Pritikin-friendly foods that are better choices when away from home and review make-ahead meal and snack options. Calorie density will be reviewed and applied to three nutrition priorities: weight maintenance, weight loss, and weight gain. The purpose of this  lesson is to reinforce (in a group setting) the key concepts around what patients are recommended to eat and how to apply these guidelines when away from home by planning and selecting Pritikin-friendly options. Patients will understand how calorie density may be adjusted for different weight management goals.  Mindful Eating  Clinical staff led group instruction and group discussion with PowerPoint presentation and patient guidebook. To enhance the learning environment the use of posters, models and videos may be added. Patients will briefly review the concepts of the [place] and the importance of low-calorie dense foods. The concept of mindful eating will be introduced as well as the importance of paying attention to internal hunger signals. Triggers for non-hunger eating and techniques for dealing with triggers will be explored. The purpose of this lesson is to provide patients with the opportunity to review the basic principles of the [place], discuss the value of eating mindfully and how to measure internal cues of hunger and fullness using the Hunger Scale. Patients will also discuss reasons for non-hunger eating and learn strategies to use for controlling emotional eating.  Targeting Your Nutrition Priorities Clinical staff led group instruction and group discussion with PowerPoint presentation and patient guidebook. To enhance the learning environment the use of posters, models and videos may be added. Patients will learn how to determine their genetic susceptibility to disease by reviewing their family history. Patients will gain insight into the importance of diet as part of an overall healthy lifestyle in mitigating the impact of genetics and other environmental insults. The purpose of this lesson is to provide patients with the opportunity to assess their personal nutrition priorities by looking at their family history, their own health history and current risk factors.  Patients will also be able to discuss ways of prioritizing and modifying the [place] for their highest risk areas  Menu  Clinical staff led group instruction and group discussion with PowerPoint presentation and patient guidebook. To enhance the learning environment the use of posters, models and videos may be added. Using menus brought in from [employer], or printed from [employer], patients will apply the [place] dining out guidelines that were presented in the [employer] video. Patients will also be able to practice these guidelines in a variety of provided scenarios. The purpose of this lesson is to provide patients with the opportunity to practice hands-on learning of the [employer]  guidelines with actual menus and practice scenarios.  Label Reading Clinical staff led group instruction and group discussion with PowerPoint presentation and patient guidebook. To enhance the learning environment the use of posters, models and videos may be added. Patients will review and discuss the Pritikin label reading guidelines presented in Pritikin's Label Reading Educational series video. Using fool labels brought in from local grocery stores and markets, patients will apply the label reading guidelines and determine if the packaged food meet the Pritikin guidelines. The purpose of this lesson is to provide patients with the opportunity to review, discuss, and practice hands-on learning of the Pritikin Label Reading guidelines with actual packaged food labels. [place] Workshops are designed to teach patients ways to prepare quick, simple, and affordable recipes at home. The importance of nutrition's role in chronic disease risk reduction is reflected in its emphasis in the overall Pritikin program. By learning how to prepare essential core Pritikin Eating Plan recipes, patients will increase control over what they eat; be able to  customize the flavor of foods without the use of added salt, sugar, or fat; and improve the quality of the food they consume. By learning a set of core recipes which are easily assembled, quickly prepared, and affordable, patients are more likely to prepare more healthy foods at home. These workshops focus on convenient breakfasts, simple entres, side dishes, and desserts which can be prepared with minimal effort and are consistent with nutrition recommendations for cardiovascular risk reduction. Cooking [employer] are taught by a Engineer, materials (RD) who has been trained by the [employer]. The chef or RD has a clear understanding of the importance of minimizing - if not completely eliminating - added fat, sugar, and sodium in recipes. Throughout the series of [place] Workshop sessions, patients will learn about healthy ingredients and efficient methods of cooking to build confidence in their capability to prepare    Cooking School weekly topics:  Adding Flavor- Sodium-Free  Fast and Healthy Breakfasts  Powerhouse Plant-Based Proteins  Satisfying Salads and Dressings  Simple Sides and Sauces  International Cuisine-Spotlight on the [place] Zones  Delicious Desserts  Savory Soups  Efficiency Cooking - Meals in a Snap  Tasty Appetizers and Snacks  Comforting Weekend Breakfasts  One-Pot Wonders   Fast Evening Meals  Easy [place] (Psychosocial): New Thoughts, New Behaviors Clinical staff led group instruction and group discussion with PowerPoint presentation and patient guidebook. To enhance the learning environment the use of posters, models and videos may be added. Patients will learn and practice techniques for developing effective health and lifestyle goals. Patients will be able to effectively apply the goal setting process learned to develop at least one new personal goal.  The  purpose of this lesson is to expose patients to a new skill set of behavior modification techniques such as techniques setting SMART goals, overcoming barriers, and achieving new thoughts and new behaviors.  Managing Moods and Relationships Clinical staff led group instruction and group discussion with PowerPoint presentation and patient guidebook. To enhance the learning environment the use of posters, models and videos may be added. Patients will learn how emotional and chronic stress factors can impact their health and relationships. They will learn healthy ways to manage their moods and utilize positive coping mechanisms. In addition, ICR patients will learn ways to improve communication skills. The purpose of this lesson is to expose patients  to ways of understanding how one's mood and health are intimately connected. Developing a healthy outlook can help build positive relationships and connections with others. Patients will understand the importance of utilizing effective communication skills that include actively listening and being heard. They will learn and understand the importance of the "4 Cs" and especially Connections in fostering of a Healthy Mind-Set.  Healthy Sleep for a Healthy Heart Clinical staff led group instruction and group discussion with PowerPoint presentation and patient guidebook. To enhance the learning environment the use of posters, models and videos may be added. At the conclusion of this workshop, patients will be able to demonstrate knowledge of the importance of sleep to overall health, well-being, and quality of life. They will understand the symptoms of, and treatments for, common sleep disorders. Patients will also be able to identify daytime and nighttime behaviors which impact sleep, and they will be able to apply these tools to help manage sleep-related challenges. The purpose of this lesson is to provide patients with a general overview of sleep and outline the  importance of quality sleep. Patients will learn about a few of the most common sleep disorders. Patients will also be introduced to the concept of "sleep hygiene," and discover ways to self-manage certain sleeping problems through simple daily behavior changes. Finally, the workshop will motivate patients by clarifying the links between quality sleep and their goals of heart-healthy living.   Recognizing and Reducing Stress Clinical staff led group instruction and group discussion with PowerPoint presentation and patient guidebook. To enhance the learning environment the use of posters, models and videos may be added. At the conclusion of this workshop, patients will be able to understand the types of stress reactions, differentiate between acute and chronic stress, and recognize the impact that chronic stress has on their health. They will also be able to apply different coping mechanisms, such as reframing negative self-talk. Patients will have the opportunity to practice a variety of stress management techniques, such as deep abdominal breathing, progressive muscle relaxation, and/or guided imagery.  The purpose of this lesson is to educate patients on the role of stress in their lives and to provide healthy techniques for coping with it.  Learning Barriers/Preferences:  Learning Barriers/Preferences - 12/17/21 0955       Learning Barriers/Preferences   Learning Barriers Sight;Hearing   wears reading glasses, wears bilateral hearing aids   Learning Preferences Written Material;Computer/Internet;Group Instruction;Skilled Demonstration;Individual Instruction             Education Topics:  Knowledge Questionnaire Score:  Knowledge Questionnaire Score - 12/17/21 0953       Knowledge Questionnaire Score   Pre Score 21/24             Core Components/Risk Factors/Patient Goals at Admission:  Personal Goals and Risk Factors at Admission - 12/17/21 0953       Core Components/Risk  Factors/Patient Goals on Admission    Weight Management Weight Gain;Yes    Intervention Weight Management: Develop a combined nutrition and exercise program designed to reach desired caloric intake, while maintaining appropriate intake of nutrient and fiber, sodium and fats, and appropriate energy expenditure required for the weight goal.;Weight Management: Provide education and appropriate resources to help participant work on and attain dietary goals.    Admit Weight 143 lb (64.9 kg)    Goal Weight: Long Term 165 lb (74.8 kg)    Expected Outcomes Weight Gain: Understanding of general recommendations for a high calorie, high protein meal plan that promotes  weight gain by distributing calorie intake throughout the day with the consumption for 4-5 meals, snacks, and/or supplements    Hypertension Yes    Intervention Provide education on lifestyle modifcations including regular physical activity/exercise, weight management, moderate sodium restriction and increased consumption of fresh fruit, vegetables, and low fat dairy, alcohol moderation, and smoking cessation.;Monitor prescription use compliance.    Expected Outcomes Short Term: Continued assessment and intervention until BP is < 140/38m HG in hypertensive participants. < 130/842mHG in hypertensive participants with diabetes, heart failure or chronic kidney disease.;Long Term: Maintenance of blood pressure at goal levels.    Lipids Yes    Intervention Provide education and support for participant on nutrition & aerobic/resistive exercise along with prescribed medications to achieve LDL '70mg'$ , HDL >'40mg'$ .    Expected Outcomes Short Term: Participant states understanding of desired cholesterol values and is compliant with medications prescribed. Participant is following exercise prescription and nutrition guidelines.;Long Term: Cholesterol controlled with medications as prescribed, with individualized exercise RX and with personalized nutrition plan.  Value goals: LDL < '70mg'$ , HDL > 40 mg.             Core Components/Risk Factors/Patient Goals Review:   Goals and Risk Factor Review     Row Name 12/22/21 0758 01/05/22 1706 01/29/22 1641         Core Components/Risk Factors/Patient Goals Review   Personal Goals Review Weight Management/Obesity;Hypertension;Lipids Weight Management/Obesity;Hypertension;Lipids Weight Management/Obesity;Hypertension;Lipids     Review [name] started intensive cardiac rehab on 12/21/21 and did well with exercise, vital signs were stable. [name] did exceed his target heart rate. Will continue to monitor. [name] started intensive cardiac rehab on 12/21/21 and is off to a good start to exercise , vital signs were stable. ChEduard Closas gained 1.1 kg since starting the program. Weight gainis a goal for ChMagdalene Mollyas been doing well with exercise , vital signs have been stable. ChEduard Closas gained 3.1 kg since starting the program. Weight gain continues to be a goal for [name]. ChEduard Closays he feels stronger since participating in the program and has more energy.     Expected Outcomes ChEduard Closill continue to participate in intensive cardiac rehab for exercise, nutrition and lifestyle modifications ChEduard Closill continue to participate in intensive cardiac rehab for exercise, nutrition and lifestyle modifications ChEduard Closill continue to participate in intensive cardiac rehab for exercise, nutrition and lifestyle modifications              Core Components/Risk Factors/Patient Goals at Discharge (Final Review):   Goals and Risk Factor Review - 01/29/22 1641       Core Components/Risk Factors/Patient Goals Review   Personal Goals Review Weight Management/Obesity;Hypertension;Lipids    Review ChEduard Closas been doing well with exercise , vital signs have been stable. ChEduard Closas gained 3.1 kg since starting the program. Weight gain continues to be a goal for [name]. ChEduard Closays he feels stronger since  participating in the program and has more energy.    Expected Outcomes ChEduard Closill continue to participate in intensive cardiac rehab for exercise, nutrition and lifestyle modifications             ITP Comments:  ITP Comments     Row Name 12/17/21 0845 12/22/21 0756 01/05/22 1705 01/29/22 1639     ITP Comments Dr TrFransico HimD, Medical Director. Introduction to Pritikin Education Program/ Intensive Cardiac Rehab. Initial Orientation Packet Reviewed with the patient 30 Day ITP Review. [name] started intensive cardiac rehab on 12/21/21 and did  well with exercise. 30 Day ITP Review. [name] started intensive cardiac rehab on 12/21/21 and is off to a good start to exercise 30 Day ITP Review. Eduard Clos has good attendance and participation in intensive cardiac rehab.             Comments: See ITP Comments

## 2022-02-02 ENCOUNTER — Ambulatory Visit: Payer: Medicare Other | Attending: Cardiology | Admitting: Cardiology

## 2022-02-02 ENCOUNTER — Encounter: Payer: Self-pay | Admitting: Cardiology

## 2022-02-02 VITALS — BP 136/74 | HR 81 | Ht 70.0 in | Wt 147.0 lb

## 2022-02-02 DIAGNOSIS — Z951 Presence of aortocoronary bypass graft: Secondary | ICD-10-CM | POA: Insufficient documentation

## 2022-02-02 DIAGNOSIS — R0609 Other forms of dyspnea: Secondary | ICD-10-CM | POA: Diagnosis not present

## 2022-02-02 DIAGNOSIS — E785 Hyperlipidemia, unspecified: Secondary | ICD-10-CM | POA: Diagnosis not present

## 2022-02-02 DIAGNOSIS — I10 Essential (primary) hypertension: Secondary | ICD-10-CM | POA: Diagnosis not present

## 2022-02-02 MED ORDER — METOPROLOL SUCCINATE ER 25 MG PO TB24: 25.0000 mg | ORAL_TABLET | Freq: Every day | ORAL | 3 refills | Status: DC

## 2022-02-02 NOTE — Progress Notes (Signed)
Cardiology Office Note:    Date:  02/02/2022   ID:  [name], DOB 08-18-1950, MRN [id number]  PCP:  Wendie Agreste, MD  Cardiologist:  [name], MD    Referring MD: Wendie Agreste, MD   Chief Complaint  Patient presents with   Medication Management    Lisinopril vs Lopressor  Doing very wel  History of Present Illness:    Timothy [name] is a 71 y.o. male  with past medical history significant for essential hypertension, dyslipidemia, few weeks ago he end up having coronary artery bypass graft which with 3 grafts with LIMA to LAD SVG to posterior descending and SVG to circumflex marginal. That was after he suffered from non-STEMI, likely left ventricle ejection fraction was preserved. Postsurgical course was complicated with anemia. He required blood transfusion.  Comes to my office to follow-up.  Overall doing great.  He goes to cardiac rehab 3 times a week and reduced tremendously otherwise he walks he said he feels best then he follow-up within 6 to 8 years.  Denies have any chest pain tightness squeezing pressure burning chest.  Past Medical History:  Diagnosis Date   Atypical chest pain 07/13/2019   Bilateral hearing loss 08/24/2019   Cataract    Cataract    Chronic kidney disease    kidney stones   Chronic pain of left knee 11/28/2019   COLONIC POLYPS, HX OF 02/12/2009   Coronary artery disease    COVID-19 01/2020   Dyslipidemia 11/21/2007   Qualifier: Diagnosis of  By: Burnice Logan  MD, Doretha Sou    Elevated LFTs 09/02/2018   Essential hypertension 04/07/2016   Treatment initiated February 2018   Follow-up examination after eye surgery 07/19/2019   Healthcare maintenance 08/22/2018   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/09/2017   HYPERLIPIDEMIA 11/21/2007   Hypertension    Hypertriglyceridemia 08/15/2019   Left epiretinal membrane 07/19/2019   Need for influenza vaccination 12/22/2017   Need for shingles vaccine 09/02/2018    NEPHROLITHIASIS, HX OF 01/17/2007   OSTEOARTHRITIS 01/17/2007   Osteoarthritis 01/17/2007   Qualifier: Diagnosis of  By: Burnice Logan  MD, Doretha Sou    Posterior vitreous detachment of left eye 07/19/2019   Right epiretinal membrane 07/19/2019   Steatosis of liver 08/24/2019   Subacromial impingement 01/16/2019    Past Surgical History:  Procedure Laterality Date   APPENDECTOMY     CARDIAC CATHETERIZATION     CARPAL TUNNEL RELEASE Right    CATARACT EXTRACTION Bilateral    june 2019   CORONARY ARTERY BYPASS GRAFT N/A 09/30/2021   Procedure: CORONARY ARTERY BYPASS GRAFTING (CABG) X 3 BYPASSES USING LEFT INTERNAL MAMMARY ARTERY AND RIGHT LEG ENDOHARVESTED SAPHENOUS VEIN GRAFT;  Surgeon: Dahlia Byes, MD;  Location: [facility];  Service: Open Heart Surgery;  Laterality: N/A;   HERNIA REPAIR     ingunial   JOINT REPLACEMENT     KNEE SURGERY Right    x 4   LEFT HEART CATH AND CORONARY ANGIOGRAPHY N/A 09/28/2021   Procedure: LEFT HEART CATH AND CORONARY ANGIOGRAPHY;  Surgeon: Lorretta Harp, MD;  Location: [facility];  Service: Cardiovascular;  Laterality: N/A;   MENISCUS REPAIR Left    knee; x 3   REPLACEMENT TOTAL KNEE Right    SHOULDER SURGERY     left   TEE WITHOUT CARDIOVERSION N/A 09/30/2021   Procedure: TRANSESOPHAGEAL ECHOCARDIOGRAM (TEE);  Surgeon: Dahlia Byes, MD;  Location: [facility];  Service: Open Heart Surgery;  Laterality: N/A;  TOTAL KNEE ARTHROPLASTY     right    Current Medications: Current Meds  Medication Sig   acetaminophen (TYLENOL) 325 MG tablet Take 2 tablets (650 mg total) by mouth every 6 (six) hours as needed for mild pain or moderate pain.   atorvastatin (LIPITOR) 80 MG tablet Take 1 tablet (80 mg total) by mouth daily.   meloxicam (MOBIC) 7.5 MG tablet Take 1 tablet (7.5 mg total) by mouth daily as needed for pain.   metoprolol tartrate (LOPRESSOR) 25 MG tablet Take 0.5 tablets (12.5 mg total) by mouth 2 (two) times daily.   Multiple Vitamin  (MULTIVITAMIN) tablet Take 1 tablet by mouth daily. Unknown strength   pantoprazole (PROTONIX) 40 MG tablet TAKE 1 TABLET BY MOUTH EVERY DAY   triamcinolone cream (KENALOG) 0.1 % Apply 1 Application topically daily as needed (For rash).   triamcinolone ointment (KENALOG) 0.1 % Apply 1 Application topically daily as needed (For rash).   [DISCONTINUED] lisinopril (ZESTRIL) 20 MG tablet Take 20 mg by mouth daily.     Allergies:   Patient has no known allergies.   Social History   Socioeconomic History   Marital status: Married    Spouse name: Not on file   Number of children: 2   Years of education: 16   Highest education level: Bachelor's degree (e.g., BA, AB, BS)  Occupational History   Not on file  Tobacco Use   Smoking status: Never   Smokeless tobacco: Never  Vaping Use   Vaping Use: Never used  Substance and Sexual Activity   Alcohol use: Yes    Alcohol/week: 1.0 standard drink of alcohol    Types: 1 Cans of beer per week    Comment: 1-2 beers a day   Drug use: Never   Sexual activity: Not Currently  Other Topics Concern   Not on file  Social History Narrative   ** Merged History Encounter **       Social Determinants of Health   Financial Resource Strain: Low Risk  (01/27/2021)   Overall Financial Resource Strain (CARDIA)    Difficulty of Paying Living Expenses: Not hard at all  Food Insecurity: No Food Insecurity (01/27/2021)   Hunger Vital Sign    Worried About Running Out of Food in the Last Year: Never true    Ran Out of Food in the Last Year: Never true  Transportation Needs: No Transportation Needs (01/27/2021)   PRAPARE - Hydrologist (Medical): No    Lack of Transportation (Non-Medical): No  Physical Activity: Sufficiently Active (01/27/2021)   Exercise Vital Sign    Days of Exercise per Week: 6 days    Minutes of Exercise per Session: 60 min  Stress: No Stress Concern Present (01/27/2021)   [place]    Feeling of Stress : Not at all  Social Connections: [place] (01/27/2021)   Social Connection and Isolation Panel [NHANES]    Frequency of Communication with Friends and Family: Twice a week    Frequency of Social Gatherings with Friends and Family: Twice a week    Attends Religious Services: More than 4 times per year    Active Member of [employer] or Organizations: Yes    Attends Archivist Meetings: 1 to 4 times per year    Marital Status: Married     Family History: The patient's family history includes Colon cancer (age of onset: 66) in his father. There  is no history of Esophageal cancer, Rectal cancer, Stomach cancer, Pancreatic cancer, or Liver disease. ROS:   Please see the history of present illness.    All 14 point review of systems negative except as described per history of present illness  EKGs/Labs/Other Studies Reviewed:      Recent Labs: 09/29/2021: TSH 1.440 10/01/2021: Magnesium 2.1 01/14/2022: ALT 89; BUN 14; Creatinine, Ser 0.82; Hemoglobin 13.2; Platelets 86.0; Potassium 4.2; Sodium 136  Recent Lipid Panel    Component Value Date/Time   CHOL 114 01/14/2022 0906   CHOL 212 (H) 03/16/2021 0903   TRIG 299.0 (H) 01/14/2022 0906   HDL 36.90 (L) 01/14/2022 0906   HDL 64 03/16/2021 0903   CHOLHDL 3 01/14/2022 0906   VLDL 59.8 (H) 01/14/2022 0906   LDLCALC 80 09/28/2021 0235   LDLCALC 124 (H) 03/16/2021 0903   LDLCALC 61 08/22/2018 1109   LDLDIRECT 49.0 01/14/2022 0906    Physical Exam:    VS:  BP 136/74 (BP Location: Left Arm, Patient Position: Sitting)   Pulse 81   Ht '5\' 10"'$  (1.778 m)   Wt 147 lb (66.7 kg)   SpO2 99%   BMI 21.09 kg/m     Wt Readings from Last 3 Encounters:  02/02/22 147 lb (66.7 kg)  01/14/22 146 lb 12.8 oz (66.6 kg)  01/04/22 143 lb (64.9 kg)     GEN:  Well nourished, well developed in no acute distress HEENT: Normal NECK: No JVD; No carotid  bruits LYMPHATICS: No lymphadenopathy CARDIAC: RRR, no murmurs, no rubs, no gallops RESPIRATORY:  Clear to auscultation without rales, wheezing or rhonchi  ABDOMEN: Soft, non-tender, non-distended MUSCULOSKELETAL:  No edema; No deformity  SKIN: Warm and dry LOWER EXTREMITIES: no swelling NEUROLOGIC:  Alert and oriented x 3 PSYCHIATRIC:  Normal affect   ASSESSMENT:    1. S/P CABG x 3   2. Essential hypertension   3. Hyperlipidemia, unspecified hyperlipidemia type    PLAN:    In order of problems listed above:  Status post coronary artery bypass graft wound healed completely.  Doing very well on appropriate medication which I continue.  He had a question about metoprolol as well as lisinopril when he was taking full dose of metoprolol full dose of lisinopril he felt very lousy therefore lisinopril has been discontinued he takes 12.5 metoprolol twice daily.  I will switch him to once a day metoprolol succinate 25 daily.  I will also ask him to have an echocardiogram done to reassess left ventricle ejection fraction, if ejection fraction is normal we will continue present management if not we will either initiate ARB or ACE inhibitor. Essential hypertension blood pressure well-controlled right now.  He said at home usually 118 2/60.  Will continue present medications. Dyslipidemia he is on high intense statin which I will continue I did review his K PN which show me total cholesterol 114 HDL 36.9.  Will continue present management   Medication Adjustments/Labs and Tests Ordered: Current medicines are reviewed at length with the patient today.  Concerns regarding medicines are outlined above.  No orders of the defined types were placed in this encounter.  Medication changes: No orders of the defined types were placed in this encounter.   Signed, Park Liter, MD, [facility] 02/02/2022 4:34 PM    [facility]

## 2022-02-02 NOTE — Patient Instructions (Signed)
Medication Instructions:  Your physician has recommended you make the following change in your medication:  STOP: Metoprolol Tartrate Change to Metoprolol Succinate '25mg'$  daily    Lab Work: None Ordered If you have labs (blood work) drawn today and your tests are completely normal, you will receive your results only by: MyChart Message (if you have MyChart) OR A paper copy in the mail If you have any lab test that is abnormal or we need to change your treatment, we will call you to review the results.   Testing/Procedures: Your physician has requested that you have an echocardiogram. Echocardiography is a painless test that uses sound waves to create images of your heart. It provides your doctor with information about the size and shape of your heart and how well your heart's chambers and valves are working. This procedure takes approximately one hour. There are no restrictions for this procedure. Please do NOT wear cologne, perfume, aftershave, or lotions (deodorant is allowed). Please arrive 15 minutes prior to your appointment time.    Follow-Up: At [facility], you and your health needs are our priority.  As part of our continuing mission to provide you with exceptional heart care, we have created designated Provider Care Teams.  These Care Teams include your primary Cardiologist (physician) and Advanced Practice Providers (APPs -  Physician Assistants and Nurse Practitioners) who all work together to provide you with the care you need, when you need it.  We recommend signing up for the patient portal called "MyChart".  Sign up information is provided on this After Visit Summary.  MyChart is used to connect with patients for Virtual Visits (Telemedicine).  Patients are able to view lab/test results, encounter notes, upcoming appointments, etc.  Non-urgent messages can be sent to your provider as well.   To learn more about what you can do with MyChart, go to [email or web address].     Your next appointment:   6 month(s)  The format for your next appointment:   In Person  Provider:   [name], MD    Other Instructions NA

## 2022-02-02 NOTE — Addendum Note (Signed)
Addended by: Jacobo Forest D on: 02/02/2022 04:57 PM   Modules accepted: Orders

## 2022-02-03 ENCOUNTER — Encounter (HOSPITAL_COMMUNITY)
Admission: RE | Admit: 2022-02-03 | Discharge: 2022-02-03 | Disposition: A | Discharge status: Home / Self Care | Payer: Medicare Other | Source: Ambulatory Visit | Attending: Cardiology | Admitting: Cardiology

## 2022-02-03 VITALS — Ht 69.5 in | Wt 151.7 lb

## 2022-02-03 DIAGNOSIS — I10 Essential (primary) hypertension: Secondary | ICD-10-CM | POA: Diagnosis not present

## 2022-02-03 DIAGNOSIS — Z48812 Encounter for surgical aftercare following surgery on the circulatory system: Secondary | ICD-10-CM | POA: Diagnosis not present

## 2022-02-03 DIAGNOSIS — Z79899 Other long term (current) drug therapy: Secondary | ICD-10-CM | POA: Diagnosis not present

## 2022-02-03 DIAGNOSIS — I252 Old myocardial infarction: Secondary | ICD-10-CM | POA: Diagnosis not present

## 2022-02-03 DIAGNOSIS — I214 Non-ST elevation (NSTEMI) myocardial infarction: Secondary | ICD-10-CM

## 2022-02-03 DIAGNOSIS — Z951 Presence of aortocoronary bypass graft: Secondary | ICD-10-CM

## 2022-02-05 ENCOUNTER — Encounter (HOSPITAL_COMMUNITY)
Admission: RE | Admit: 2022-02-05 | Discharge: 2022-02-05 | Disposition: A | Discharge status: Home / Self Care | Payer: Medicare Other | Source: Ambulatory Visit | Attending: Cardiology | Admitting: Cardiology

## 2022-02-05 DIAGNOSIS — I10 Essential (primary) hypertension: Secondary | ICD-10-CM | POA: Diagnosis not present

## 2022-02-05 DIAGNOSIS — Z48812 Encounter for surgical aftercare following surgery on the circulatory system: Secondary | ICD-10-CM | POA: Diagnosis not present

## 2022-02-05 DIAGNOSIS — Z79899 Other long term (current) drug therapy: Secondary | ICD-10-CM | POA: Diagnosis not present

## 2022-02-05 DIAGNOSIS — Z951 Presence of aortocoronary bypass graft: Secondary | ICD-10-CM | POA: Diagnosis not present

## 2022-02-05 DIAGNOSIS — I214 Non-ST elevation (NSTEMI) myocardial infarction: Secondary | ICD-10-CM

## 2022-02-05 DIAGNOSIS — I252 Old myocardial infarction: Secondary | ICD-10-CM | POA: Diagnosis not present

## 2022-02-08 ENCOUNTER — Encounter (HOSPITAL_COMMUNITY)
Admission: RE | Admit: 2022-02-08 | Discharge: 2022-02-08 | Disposition: A | Discharge status: Home / Self Care | Payer: Medicare Other | Source: Ambulatory Visit | Attending: Cardiology | Admitting: Cardiology

## 2022-02-08 DIAGNOSIS — Z48812 Encounter for surgical aftercare following surgery on the circulatory system: Secondary | ICD-10-CM | POA: Diagnosis not present

## 2022-02-08 DIAGNOSIS — I10 Essential (primary) hypertension: Secondary | ICD-10-CM | POA: Diagnosis not present

## 2022-02-08 DIAGNOSIS — Z79899 Other long term (current) drug therapy: Secondary | ICD-10-CM | POA: Diagnosis not present

## 2022-02-08 DIAGNOSIS — I252 Old myocardial infarction: Secondary | ICD-10-CM | POA: Diagnosis not present

## 2022-02-08 DIAGNOSIS — Z951 Presence of aortocoronary bypass graft: Secondary | ICD-10-CM | POA: Diagnosis not present

## 2022-02-08 DIAGNOSIS — I214 Non-ST elevation (NSTEMI) myocardial infarction: Secondary | ICD-10-CM

## 2022-02-10 ENCOUNTER — Ambulatory Visit (INDEPENDENT_AMBULATORY_CARE_PROVIDER_SITE_OTHER): Payer: Medicare Other | Admitting: *Deleted

## 2022-02-10 ENCOUNTER — Encounter (HOSPITAL_COMMUNITY)
Admission: RE | Admit: 2022-02-10 | Discharge: 2022-02-10 | Disposition: A | Discharge status: Home / Self Care | Payer: Medicare Other | Source: Ambulatory Visit | Attending: Cardiology | Admitting: Cardiology

## 2022-02-10 DIAGNOSIS — I214 Non-ST elevation (NSTEMI) myocardial infarction: Secondary | ICD-10-CM

## 2022-02-10 DIAGNOSIS — I252 Old myocardial infarction: Secondary | ICD-10-CM | POA: Diagnosis not present

## 2022-02-10 DIAGNOSIS — Z951 Presence of aortocoronary bypass graft: Secondary | ICD-10-CM | POA: Diagnosis not present

## 2022-02-10 DIAGNOSIS — Z79899 Other long term (current) drug therapy: Secondary | ICD-10-CM | POA: Diagnosis not present

## 2022-02-10 DIAGNOSIS — I10 Essential (primary) hypertension: Secondary | ICD-10-CM | POA: Diagnosis not present

## 2022-02-10 DIAGNOSIS — Z0000 Encounter for general adult medical examination without abnormal findings: Secondary | ICD-10-CM | POA: Diagnosis not present

## 2022-02-10 DIAGNOSIS — Z48812 Encounter for surgical aftercare following surgery on the circulatory system: Secondary | ICD-10-CM | POA: Diagnosis not present

## 2022-02-10 NOTE — Progress Notes (Signed)
Subjective:   Timothy [name] is a 71 y.o. male who presents for Medicare Annual/Subsequent preventive examination.  I connected with  Timothy [name] on 02/10/22 by a telephone enabled telemedicine application and verified that I am speaking with the correct person using two identifiers.   I discussed the limitations of evaluation and management by telemedicine. The patient expressed understanding and agreed to proceed.  Patient location: home  Provider location: Tele-health-office    Review of Systems     Cardiac Risk Factors include: advanced age (>49mn, >>78women);male gender;hypertension     Objective:    Today's Vitals   There is no height or weight on file to calculate BMI.     02/10/2022    8:32 AM 09/29/2021   12:35 PM 09/27/2021   10:03 AM 03/01/2021   10:23 AM 01/27/2021    2:29 PM 01/22/2020    2:21 PM 01/17/2019   10:17 AM  Advanced Directives  Does Patient Have a Medical Advance Directive? Yes  No No Yes Yes Yes  Type of ATheatre managerof AMonetteLiving will H[place]Living will HLindenLiving will  Does patient want to make changes to medical advance directive?       No - Patient declined  Copy of HBethanyin Chart? Yes - validated most recent copy scanned in chart (See row information)    No - copy requested No - copy requested No - copy requested  Would patient like information on creating a medical advance directive?  No - Patient declined         Current Medications (verified) Outpatient Encounter Medications as of 02/10/2022  Medication Sig   acetaminophen (TYLENOL) 325 MG tablet Take 2 tablets (650 mg total) by mouth every 6 (six) hours as needed for mild pain or moderate pain.   atorvastatin (LIPITOR) 80 MG tablet Take 1 tablet (80 mg total) by mouth daily.   meloxicam (MOBIC) 7.5 MG tablet Take 1 tablet (7.5 mg total) by mouth  daily as needed for pain.   metoprolol succinate (TOPROL-XL) 25 MG 24 hr tablet Take 1 tablet (25 mg total) by mouth daily. Take with or immediately following a meal.   Multiple Vitamin (MULTIVITAMIN) tablet Take 1 tablet by mouth daily. Unknown strength   pantoprazole (PROTONIX) 40 MG tablet TAKE 1 TABLET BY MOUTH EVERY DAY   triamcinolone cream (KENALOG) 0.1 % Apply 1 Application topically daily as needed (For rash).   triamcinolone ointment (KENALOG) 0.1 % Apply 1 Application topically daily as needed (For rash).   No facility-administered encounter medications on file as of 02/10/2022.    Allergies (verified) Patient has no known allergies.   History: Past Medical History:  Diagnosis Date   Atypical chest pain 07/13/2019   Bilateral hearing loss 08/24/2019   Cataract    Cataract    Chronic kidney disease    kidney stones   Chronic pain of left knee 11/28/2019   COLONIC POLYPS, HX OF 02/12/2009   Coronary artery disease    COVID-19 01/2020   Dyslipidemia 11/21/2007   Qualifier: Diagnosis of  By: KBurnice Logan MD, PDoretha Sou   Elevated LFTs 09/02/2018   Essential hypertension 04/07/2016   Treatment initiated February 2018   Follow-up examination after eye surgery 07/19/2019   Healthcare maintenance 08/22/2018   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/09/2017   HYPERLIPIDEMIA 11/21/2007   Hypertension    Hypertriglyceridemia 08/15/2019  Left epiretinal membrane 07/19/2019   Need for influenza vaccination 12/22/2017   Need for shingles vaccine 09/02/2018   NEPHROLITHIASIS, HX OF 01/17/2007   OSTEOARTHRITIS 01/17/2007   Osteoarthritis 01/17/2007   Qualifier: Diagnosis of  By: Burnice Logan  MD, Doretha Sou    Posterior vitreous detachment of left eye 07/19/2019   Right epiretinal membrane 07/19/2019   Steatosis of liver 08/24/2019   Subacromial impingement 01/16/2019   Past Surgical History:  Procedure Laterality Date   APPENDECTOMY     CARDIAC CATHETERIZATION     CARPAL  TUNNEL RELEASE Right    CATARACT EXTRACTION Bilateral    june 2019   CORONARY ARTERY BYPASS GRAFT N/A 09/30/2021   Procedure: CORONARY ARTERY BYPASS GRAFTING (CABG) X 3 BYPASSES USING LEFT INTERNAL MAMMARY ARTERY AND RIGHT LEG ENDOHARVESTED SAPHENOUS VEIN GRAFT;  Surgeon: Dahlia Byes, MD;  Location: [facility];  Service: Open Heart Surgery;  Laterality: N/A;   HERNIA REPAIR     ingunial   JOINT REPLACEMENT     KNEE SURGERY Right    x 4   LEFT HEART CATH AND CORONARY ANGIOGRAPHY N/A 09/28/2021   Procedure: LEFT HEART CATH AND CORONARY ANGIOGRAPHY;  Surgeon: Lorretta Harp, MD;  Location: [facility];  Service: Cardiovascular;  Laterality: N/A;   MENISCUS REPAIR Left    knee; x 3   REPLACEMENT TOTAL KNEE Right    SHOULDER SURGERY     left   TEE WITHOUT CARDIOVERSION N/A 09/30/2021   Procedure: TRANSESOPHAGEAL ECHOCARDIOGRAM (TEE);  Surgeon: Dahlia Byes, MD;  Location: [facility];  Service: Open Heart Surgery;  Laterality: N/A;   TOTAL KNEE ARTHROPLASTY     right   Family History  Problem Relation Age of Onset   Colon cancer Father 99   Esophageal cancer Neg Hx    Rectal cancer Neg Hx    Stomach cancer Neg Hx    Pancreatic cancer Neg Hx    Liver disease Neg Hx    Social History   Socioeconomic History   Marital status: Married    Spouse name: Not on file   Number of children: 2   Years of education: 16   Highest education level: Bachelor's degree (e.g., BA, AB, BS)  Occupational History   Not on file  Tobacco Use   Smoking status: Never   Smokeless tobacco: Never  Vaping Use   Vaping Use: Never used  Substance and Sexual Activity   Alcohol use: Yes    Alcohol/week: 1.0 standard drink of alcohol    Types: 1 Cans of beer per week    Comment: 1-2 beers a day   Drug use: Never   Sexual activity: Not Currently  Other Topics Concern   Not on file  Social History Narrative   ** Merged History Encounter **       Social Determinants of Health   Financial  Resource Strain: Low Risk  (02/10/2022)   Overall Financial Resource Strain (CARDIA)    Difficulty of Paying Living Expenses: Not hard at all  Food Insecurity: No Food Insecurity (02/10/2022)   Hunger Vital Sign    Worried About Running Out of Food in the Last Year: Never true    Ran Out of Food in the Last Year: Never true  Transportation Needs: No Transportation Needs (02/10/2022)   PRAPARE - Hydrologist (Medical): No    Lack of Transportation (Non-Medical): No  Physical Activity: Sufficiently Active (02/10/2022)   Exercise Vital Sign  Days of Exercise per Week: 5 days    Minutes of Exercise per Session: 60 min  Stress: No Stress Concern Present (02/10/2022)   [place]    Feeling of Stress : Not at all  Social Connections: Socially Integrated (02/10/2022)   Social Connection and Isolation Panel [NHANES]    Frequency of Communication with Friends and Family: More than three times a week    Frequency of Social Gatherings with Friends and Family: Twice a week    Attends Religious Services: More than 4 times per year    Active Member of [employer] or Organizations: Yes    Attends Music therapist: More than 4 times per year    Marital Status: Married    Tobacco Counseling Counseling given: Not Answered   Clinical Intake:  Pre-visit preparation completed: Yes  Pain : No/denies pain     Diabetes: No  How often do you need to have someone help you when you read instructions, pamphlets, or other written materials from your doctor or pharmacy?: 1 - Never  Diabetic?  no  Interpreter Needed?: No  Information entered by :: Leroy Kennedy LPN   Activities of Daily Living    02/10/2022    8:33 AM 09/29/2021   12:36 PM  In your present state of health, do you have any difficulty performing the following activities:  Hearing? 0   Vision? 0   Difficulty concentrating or  making decisions? 0   Walking or climbing stairs? 0   Dressing or bathing? 0   Doing errands, shopping? 0 1  Preparing Food and eating ? N   Using the Toilet? N   In the past six months, have you accidently leaked urine? N   Do you have problems with loss of bowel control? N   Managing your Medications? N   Managing your Finances? N   Housekeeping or managing your Housekeeping? N     Patient Care Team: Wendie Agreste, MD as PCP - General (Family Medicine)  Indicate any recent Medical Services you may have received from other than [facility] providers in the past year (date may be approximate).     Assessment:   This is a routine wellness examination for [place].  Hearing/Vision screen Hearing Screening - Comments:: Bilateral hearing aids Vision Screening - Comments:: [facility] Up to date  Dietary issues and exercise activities discussed: Current Exercise Habits: Structured exercise class, Type of exercise: strength training/weights;walking (swimming), Time (Minutes): 60, Frequency (Times/Week): 5, Weekly Exercise (Minutes/Week): 300, Intensity: Moderate   Goals Addressed             This Visit's Progress    Patient Stated       Continue current lifestyle       Depression Screen    02/10/2022    8:38 AM 01/14/2022    8:29 AM 12/17/2021    8:50 AM 06/24/2021    7:51 AM 05/13/2021    3:15 PM 01/27/2021    2:31 PM 01/27/2021    2:28 PM  PHQ 2/9 Scores  PHQ - 2 Score 0 0 0 0 0 0 0  PHQ- 9 Score 0 0  0 0      Fall Risk    02/10/2022    8:32 AM 01/14/2022    8:30 AM 12/17/2021    9:30 AM 06/24/2021    7:51 AM 05/13/2021    3:15 PM  Fall Risk   Falls in the past year? 0  0 0 0 0  Number falls in past yr: 0 0 0  0  Injury with Fall? 0 0 0  0  Risk for fall due to :  No Fall Risks  No Fall Risks No Fall Risks  Follow up Falls evaluation completed;Falls prevention discussed;Education provided Falls evaluation completed Falls evaluation completed Falls evaluation completed  Falls evaluation completed    FALL RISK PREVENTION PERTAINING TO THE HOME:  Any stairs in or around the home? Yes  If so, are there any without handrails? No  Home free of loose throw rugs in walkways, pet beds, electrical cords, etc? Yes  Adequate lighting in your home to reduce risk of falls? Yes   ASSISTIVE DEVICES UTILIZED TO PREVENT FALLS:  Life alert? No  Use of a cane, walker or w/c? No  Grab bars in the bathroom? Yes  Shower chair or bench in shower? Yes  Elevated toilet seat or a handicapped toilet? No   TIMED UP AND GO:  Was the test performed? No .    Cognitive Function:        02/10/2022    8:35 AM  6CIT Screen  What Year? 0 points  What month? 0 points  What time? 0 points  Count back from 20 0 points  Months in reverse 0 points  Repeat phrase 2 points  Total Score 2 points    Immunizations Immunization History  Administered Date(s) Administered   Fluad Quad(high Dose 65+) 10/24/2018, 11/28/2019   Influenza Split 01/04/2011   Influenza Whole 01/19/2007, 11/21/2007, 12/24/2008, 02/13/2010   Influenza, High Dose Seasonal PF 01/07/2015, 01/30/2016, 12/17/2016, 12/22/2017, 01/05/2022   Influenza,inj,Quad PF,6+ Mos 11/14/2012, 01/02/2014   Moderna Sars-Covid-2 Vaccination 04/11/2019, 05/11/2019   Pneumococcal Conjugate-13 05/20/2015   Pneumococcal Polysaccharide-23 12/17/2016   Tdap 11/14/2012   Zoster Recombinat (Shingrix) 08/22/2018, 10/24/2018   Zoster, Live 06/08/2010    TDAP status: Up to date  Flu Vaccine status: Up to date  Pneumococcal vaccine status: Up to date  Covid-19 vaccine status: Declined, Education has been provided regarding the importance of this vaccine but patient still declined. Advised may receive this vaccine at local pharmacy or Health Dept.or vaccine clinic. Aware to provide a copy of the vaccination record if obtained from local pharmacy or Health Dept. Verbalized acceptance and understanding.  Qualifies for Shingles  Vaccine? No   Zostavax completed Yes   Shingrix Completed?: Yes  Screening Tests Health Maintenance  Topic Date Due   DTaP/Tdap/Td (2 - Td or Tdap) 11/15/2022   Medicare Annual Wellness (AWV)  02/11/2023   COLONOSCOPY (Pts 45-48yr Insurance coverage will need to be confirmed)  02/15/2023   Pneumonia Vaccine 71 Years old  Completed   INFLUENZA VACCINE  Completed   Hepatitis C Screening  Completed   Zoster Vaccines- Shingrix  Completed   HPV VACCINES  Aged Out   COVID-19 Vaccine  Discontinued    Health Maintenance  There are no preventive care reminders to display for this patient.   Colorectal cancer screening: Type of screening: Colonoscopy. Completed 2019. Repeat every 5 years  Lung Cancer Screening: (Low Dose CT Chest recommended if Age 71-80years, 30 pack-year currently smoking OR have quit w/in 15years.) does not qualify.   Lung Cancer Screening Referral:   Additional Screening:  Hepatitis C Screening: does not qualify; Completed 2017  Vision Screening: Recommended annual ophthalmology exams for early detection of glaucoma and other disorders of the eye. Is the patient up to date with their annual eye exam?  Yes  Who is the provider or what is the name of the office in which the patient attends annual eye exams? [facility] If pt is not established with a provider, would they like to be referred to a provider to establish care? No .   Dental Screening: Recommended annual dental exams for proper oral hygiene  Community Resource Referral / Chronic Care Management: CRR required this visit?  No   CCM required this visit?  No      Plan:     I have personally reviewed and noted the following in the patient's chart:   Medical and social history Use of alcohol, tobacco or illicit drugs  Current medications and supplements including opioid prescriptions. Patient is not currently taking opioid prescriptions. Functional ability and status Nutritional status Physical  activity Advanced directives List of other physicians Hospitalizations, surgeries, and ER visits in previous 12 months Vitals Screenings to include cognitive, depression, and falls Referrals and appointments  In addition, I have reviewed and discussed with patient certain preventive protocols, quality metrics, and best practice recommendations. A written personalized care plan for preventive services as well as general preventive health recommendations were provided to patient.     Leroy Kennedy, LPN   [id number]   Nurse Notes:

## 2022-02-10 NOTE — Patient Instructions (Signed)
Timothy [name] , Thank you for taking time to come for your Medicare Wellness Visit. I appreciate your ongoing commitment to your health goals. Please review the following plan we discussed and let me know if I can assist you in the future.   These are the goals we discussed:  Goals      Maintain healthy active lifestyle.     Patient Stated     Continue current lifestyle        This is a list of the screening recommended for you and due dates:  Health Maintenance  Topic Date Due   DTaP/Tdap/Td vaccine (2 - Td or Tdap) 11/15/2022   Medicare Annual Wellness Visit  02/11/2023   Colon Cancer Screening  02/15/2023   Pneumonia Vaccine  Completed   Flu Shot  Completed   Hepatitis C Screening: USPSTF Recommendation to screen - Ages 18-79 yo.  Completed   Zoster (Shingles) Vaccine  Completed   HPV Vaccine  Aged Out   COVID-19 Vaccine  Discontinued    Advanced directives: on file  Preventive Care 18 Years and Older, Male  Preventive care refers to lifestyle choices and visits with your health care provider that can promote health and wellness. What does preventive care include? A yearly physical exam. This is also called an annual well check. Dental exams once or twice a year. Routine eye exams. Ask your health care provider how often you should have your eyes checked. Personal lifestyle choices, including: Daily care of your teeth and gums. Regular physical activity. Eating a healthy diet. Avoiding tobacco and drug use. Limiting alcohol use. Practicing safe sex. Taking low doses of aspirin every day. Taking vitamin and mineral supplements as recommended by your health care provider. What happens during an annual well check? The services and screenings done by your health care provider during your annual well check will depend on your age, overall health, lifestyle risk factors, and family history of disease. Counseling  Your health care provider may ask you questions about  your: Alcohol use. Tobacco use. Drug use. Emotional well-being. Home and relationship well-being. Sexual activity. Eating habits. History of falls. Memory and ability to understand (cognition). Work and work Statistician. Screening  You may have the following tests or measurements: Height, weight, and BMI. Blood pressure. Lipid and cholesterol levels. These may be checked every 5 years, or more frequently if you are over 56 years old. Skin check. Lung cancer screening. You may have this screening every year starting at age 29 if you have a 30-pack-year history of smoking and currently smoke or have quit within the past 15 years. Fecal occult blood test (FOBT) of the stool. You may have this test every year starting at age 12. Flexible sigmoidoscopy or colonoscopy. You may have a sigmoidoscopy every 5 years or a colonoscopy every 10 years starting at age 35. Prostate cancer screening. Recommendations will vary depending on your family history and other risks. Hepatitis C blood test. Hepatitis B blood test. Sexually transmitted disease (STD) testing. Diabetes screening. This is done by checking your blood sugar (glucose) after you have not eaten for a while (fasting). You may have this done every 1-3 years. Abdominal aortic aneurysm (AAA) screening. You may need this if you are a current or former smoker. Osteoporosis. You may be screened starting at age 9 if you are at high risk. Talk with your health care provider about your test results, treatment options, and if necessary, the need for more tests. Vaccines  Your health  care provider may recommend certain vaccines, such as: Influenza vaccine. This is recommended every year. Tetanus, diphtheria, and acellular pertussis (Tdap, Td) vaccine. You may need a Td booster every 10 years. Zoster vaccine. You may need this after age 21. Pneumococcal 13-valent conjugate (PCV13) vaccine. One dose is recommended after age 15. Pneumococcal  polysaccharide (PPSV23) vaccine. One dose is recommended after age 66. Talk to your health care provider about which screenings and vaccines you need and how often you need them. This information is not intended to replace advice given to you by your health care provider. Make sure you discuss any questions you have with your health care provider. Document Released: 03/14/2015 Document Revised: 11/05/2015 Document Reviewed: 12/17/2014 Elsevier Interactive Patient Education  2017 [place] Prevention in the Home Falls can cause injuries. They can happen to people of all ages. There are many things you can do to make your home safe and to help prevent falls. What can I do on the outside of my home? Regularly fix the edges of walkways and driveways and fix any cracks. Remove anything that might make you trip as you walk through a door, such as a raised step or threshold. Trim any bushes or trees on the path to your home. Use bright outdoor lighting. Clear any walking paths of anything that might make someone trip, such as rocks or tools. Regularly check to see if handrails are loose or broken. Make sure that both sides of any steps have handrails. Any raised decks and porches should have guardrails on the edges. Have any leaves, snow, or ice cleared regularly. Use sand or salt on walking paths during winter. Clean up any spills in your garage right away. This includes oil or grease spills. What can I do in the bathroom? Use night lights. Install grab bars by the toilet and in the tub and shower. Do not use towel bars as grab bars. Use non-skid mats or decals in the tub or shower. If you need to sit down in the shower, use a plastic, non-slip stool. Keep the floor dry. Clean up any water that spills on the floor as soon as it happens. Remove soap buildup in the tub or shower regularly. Attach bath mats securely with double-sided non-slip rug tape. Do not have throw rugs and other  things on the floor that can make you trip. What can I do in the bedroom? Use night lights. Make sure that you have a light by your bed that is easy to reach. Do not use any sheets or blankets that are too big for your bed. They should not hang down onto the floor. Have a firm chair that has side arms. You can use this for support while you get dressed. Do not have throw rugs and other things on the floor that can make you trip. What can I do in the kitchen? Clean up any spills right away. Avoid walking on wet floors. Keep items that you use a lot in easy-to-reach places. If you need to reach something above you, use a strong step stool that has a grab bar. Keep electrical cords out of the way. Do not use floor polish or wax that makes floors slippery. If you must use wax, use non-skid floor wax. Do not have throw rugs and other things on the floor that can make you trip. What can I do with my stairs? Do not leave any items on the stairs. Make sure that there are handrails on  both sides of the stairs and use them. Fix handrails that are broken or loose. Make sure that handrails are as long as the stairways. Check any carpeting to make sure that it is firmly attached to the stairs. Fix any carpet that is loose or worn. Avoid having throw rugs at the top or bottom of the stairs. If you do have throw rugs, attach them to the floor with carpet tape. Make sure that you have a light switch at the top of the stairs and the bottom of the stairs. If you do not have them, ask someone to add them for you. What else can I do to help prevent falls? Wear shoes that: Do not have high heels. Have rubber bottoms. Are comfortable and fit you well. Are closed at the toe. Do not wear sandals. If you use a stepladder: Make sure that it is fully opened. Do not climb a closed stepladder. Make sure that both sides of the stepladder are locked into place. Ask someone to hold it for you, if possible. Clearly  mark and make sure that you can see: Any grab bars or handrails. First and last steps. Where the edge of each step is. Use tools that help you move around (mobility aids) if they are needed. These include: Canes. Walkers. Scooters. Crutches. Turn on the lights when you go into a dark area. Replace any light bulbs as soon as they burn out. Set up your furniture so you have a clear path. Avoid moving your furniture around. If any of your floors are uneven, fix them. If there are any pets around you, be aware of where they are. Review your medicines with your doctor. Some medicines can make you feel dizzy. This can increase your chance of falling. Ask your doctor what other things that you can do to help prevent falls. This information is not intended to replace advice given to you by your health care provider. Make sure you discuss any questions you have with your health care provider. Document Released: 12/12/2008 Document Revised: 07/24/2015 Document Reviewed: 03/22/2014 Elsevier Interactive Patient Education  2017 [employer].

## 2022-02-12 ENCOUNTER — Encounter (HOSPITAL_COMMUNITY)
Admission: RE | Admit: 2022-02-12 | Discharge: 2022-02-12 | Disposition: A | Discharge status: Home / Self Care | Payer: Medicare Other | Source: Ambulatory Visit | Attending: Cardiology | Admitting: Cardiology

## 2022-02-12 VITALS — BP 132/62 | HR 87 | Ht 69.5 in | Wt 151.0 lb

## 2022-02-12 DIAGNOSIS — I252 Old myocardial infarction: Secondary | ICD-10-CM | POA: Diagnosis not present

## 2022-02-12 DIAGNOSIS — I10 Essential (primary) hypertension: Secondary | ICD-10-CM | POA: Diagnosis not present

## 2022-02-12 DIAGNOSIS — Z951 Presence of aortocoronary bypass graft: Secondary | ICD-10-CM

## 2022-02-12 DIAGNOSIS — I214 Non-ST elevation (NSTEMI) myocardial infarction: Secondary | ICD-10-CM

## 2022-02-12 DIAGNOSIS — Z79899 Other long term (current) drug therapy: Secondary | ICD-10-CM | POA: Diagnosis not present

## 2022-02-12 DIAGNOSIS — Z48812 Encounter for surgical aftercare following surgery on the circulatory system: Secondary | ICD-10-CM | POA: Diagnosis not present

## 2022-02-12 NOTE — Progress Notes (Signed)
Discharge Progress Report  Patient Details  Name: Timothy [name] MRN: [id number] Date of Birth: 03-09-1950 Referring Provider:   Flowsheet Row INTENSIVE CARDIAC REHAB ORIENT from 12/17/2021 in [facility]  Referring Provider [name], MD        Number of Visits: 18 exercise and 17 education sessions.  Reason for Discharge:  Patient reached a stable level of exercise. Patient independent in their exercise. Patient has met program and personal goals.  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  ADL UCSD:   Initial Exercise Prescription:  Initial Exercise Prescription - 12/17/21 0900       Date of Initial Exercise RX and Referring Provider   Date 12/17/21    Referring Provider [name], MD    Expected Discharge Date 02/12/22      Recumbant Bike   Level 2.5    RPM 60    Watts 55    Minutes 15    METs 4.7      Arm Ergometer   Level 2    RPM 60    Minutes 15    METs 4.7      Prescription Details   Frequency (times per week) 3    Duration Progress to 30 minutes of continuous aerobic without signs/symptoms of physical distress      Intensity   THRR 40-80% of Max Heartrate 60-119    Ratings of Perceived Exertion 11-13    Perceived Dyspnea 0-4      Progression   Progression Continue progressive overload as per policy without signs/symptoms or physical distress.      Resistance Training   Training Prescription Yes    Weight 4 lbs    Reps 10-15             Discharge Exercise Prescription (Final Exercise Prescription Changes):  Exercise Prescription Changes - 02/01/22 1020       Response to Exercise   Blood Pressure (Admit) 122/68    Blood Pressure (Exercise) 142/66    Blood Pressure (Exit) 120/64    Heart Rate (Admit) 76 bpm    Heart Rate (Exercise) 124 bpm     Heart Rate (Exit) 85 bpm    Rating of Perceived Exertion (Exercise) 13    Symptoms None    Comments Increased WL on SE and AE today.    Duration Continue with 30 min of aerobic exercise without signs/symptoms of physical distress.    Intensity THRR unchanged      Progression   Progression Continue to progress workloads to maintain intensity without signs/symptoms of physical distress.    Average METs 5.6      Resistance Training   Training Prescription Yes    Weight 8 lbs    Reps 10-15    Time 10 Minutes      Interval Training   Interval Training No      NuStep   Level 7    SPM 83    Minutes 15    METs 3.5      Arm Ergometer   Level 7    Minutes 15    METs 7.8      Home Exercise Plan   Plans to continue exercise at [employer] (comment)   Walking, exercise at [employer]   Frequency Add 4 additional days to  program exercise sessions.    Initial Home Exercises Provided 01/06/22             Functional Capacity:  6 Minute Walk     Row Name 12/17/21 0851 02/03/22 1149       6 Minute Walk   Phase Initial Discharge    Distance 1932 feet 2449 feet    Distance % Change -- 26.76 %    Distance Feet Change -- 517 ft    Walk Time 6 minutes 6 minutes    # of Rest Breaks 0 0    MPH 3.66 4.64    METS 4.7 5.4    RPE 7 10    Perceived Dyspnea  0 0    VO2 Peak 16.44 19.2    Symptoms No No    Resting HR 82 bpm 80 bpm    Resting BP 110/74 128/62    Resting Oxygen Saturation  99 % 98 %    Exercise Oxygen Saturation  during 6 min walk 100 % 98 %    Max Ex. HR 112 bpm 123 bpm    Max Ex. BP 152/64 168/64    2 Minute Post BP 130/68 124/68             Psychological, QOL, Others - Outcomes: PHQ 2/9:    02/12/2022   11:01 AM 02/10/2022    8:38 AM 01/14/2022    8:29 AM 12/17/2021    8:50 AM 06/24/2021    7:51 AM  Depression screen PHQ 2/9  Decreased Interest 0 0 0 0 0  Down, Depressed, Hopeless 0 0 0 0 0  PHQ - 2 Score 0 0 0 0 0  Altered  sleeping  0 0  0  Tired, decreased energy  0 0  0  Change in appetite  0 0  0  Feeling bad or failure about yourself   0 0  0  Trouble concentrating  0 0  0  Moving slowly or fidgety/restless  0 0  0  Suicidal thoughts  0 0  0  PHQ-9 Score  0 0  0  Difficult doing work/chores     Not difficult at all    Quality of Life:  Quality of Life - 02/05/22 1257       Quality of Life   Select Quality of Life      Quality of Life Scores   Health/Function Pre 28.7 %    Health/Function Post 30 %    Health/Function % Change 4.53 %    Socioeconomic Pre 28.63 %    Socioeconomic Post 30 %    Socioeconomic % Change  4.79 %    Psych/Spiritual Pre 29.29 %    Psych/Spiritual Post 30 %    Psych/Spiritual % Change 2.42 %    Family Pre 30 %    Family Post 30 %    Family % Change 0 %    GLOBAL Pre 28.99 %    GLOBAL Post 30 %    GLOBAL % Change 3.48 %             Personal Goals: Goals established at orientation with interventions provided to work toward goal.  Personal Goals and Risk Factors at Admission - 12/17/21 0953       Core Components/Risk Factors/Patient Goals on Admission    Weight Management Weight Gain;Yes    Intervention Weight Management: Develop a combined nutrition and exercise program designed to reach desired caloric intake, while maintaining appropriate intake of  nutrient and fiber, sodium and fats, and appropriate energy expenditure required for the weight goal.;Weight Management: Provide education and appropriate resources to help participant work on and attain dietary goals.    Admit Weight 143 lb (64.9 kg)    Goal Weight: Long Term 165 lb (74.8 kg)    Expected Outcomes Weight Gain: Understanding of general recommendations for a high calorie, high protein meal plan that promotes weight gain by distributing calorie intake throughout the day with the consumption for 4-5 meals, snacks, and/or supplements    Hypertension Yes    Intervention Provide education on lifestyle  modifcations including regular physical activity/exercise, weight management, moderate sodium restriction and increased consumption of fresh fruit, vegetables, and low fat dairy, alcohol moderation, and smoking cessation.;Monitor prescription use compliance.    Expected Outcomes Short Term: Continued assessment and intervention until BP is < 140/67m HG in hypertensive participants. < 130/838mHG in hypertensive participants with diabetes, heart failure or chronic kidney disease.;Long Term: Maintenance of blood pressure at goal levels.    Lipids Yes    Intervention Provide education and support for participant on nutrition & aerobic/resistive exercise along with prescribed medications to achieve LDL <7064mHDL >31m20m  Expected Outcomes Short Term: Participant states understanding of desired cholesterol values and is compliant with medications prescribed. Participant is following exercise prescription and nutrition guidelines.;Long Term: Cholesterol controlled with medications as prescribed, with individualized exercise RX and with personalized nutrition plan. Value goals: LDL < 70mg53mL > 40 mg.              Personal Goals Discharge:  Goals and Risk Factor Review     Row Name 12/22/21 0758 01/05/22 1706 01/29/22 1641         Core Components/Risk Factors/Patient Goals Review   Personal Goals Review Weight Management/Obesity;Hypertension;Lipids Weight Management/Obesity;Hypertension;Lipids Weight Management/Obesity;Hypertension;Lipids     Review [name] started intensive cardiac rehab on 12/21/21 and did well with exercise, vital signs were stable. [name] did exceed his target heart rate. Will continue to monitor. [name] started intensive cardiac rehab on 12/21/21 and is off to a good start to exercise , vital signs were stable. CharlEduard Closgained 1.1 kg since starting the program. Weight gainis a goal for CharlMagdalene Mollybeen doing well with exercise , vital signs have been stable.  CharlEduard Closgained 3.1 kg since starting the program. Weight gain continues to be a goal for [name]. CharlEduard Clos he feels stronger since participating in the program and has more energy.     Expected Outcomes CharlEduard Clos continue to participate in intensive cardiac rehab for exercise, nutrition and lifestyle modifications CharlEduard Clos continue to participate in intensive cardiac rehab for exercise, nutrition and lifestyle modifications CharlEduard Clos continue to participate in intensive cardiac rehab for exercise, nutrition and lifestyle modifications              Exercise Goals and Review:  Exercise Goals     Row Name 12/17/21 0929             Exercise Goals   Increase Physical Activity Yes       Intervention Provide advice, education, support and counseling about physical activity/exercise needs.;Develop an individualized exercise prescription for aerobic and resistive training based on initial evaluation findings, risk stratification, comorbidities and participant's personal goals.       Expected Outcomes Short Term: Attend rehab on a regular basis to increase amount of physical activity.;Long Term: Add in home exercise to make exercise part of routine and  to increase amount of physical activity.;Long Term: Exercising regularly at least 3-5 days a week.       Increase Strength and Stamina Yes       Intervention Provide advice, education, support and counseling about physical activity/exercise needs.;Develop an individualized exercise prescription for aerobic and resistive training based on initial evaluation findings, risk stratification, comorbidities and participant's personal goals.       Expected Outcomes Short Term: Increase workloads from initial exercise prescription for resistance, speed, and METs.;Short Term: Perform resistance training exercises routinely during rehab and add in resistance training at home;Long Term: Improve cardiorespiratory fitness, muscular endurance and  strength as measured by increased METs and functional capacity (6MWT)       Able to understand and use rate of perceived exertion (RPE) scale Yes       Intervention Provide education and explanation on how to use RPE scale       Expected Outcomes Short Term: Able to use RPE daily in rehab to express subjective intensity level;Long Term:  Able to use RPE to guide intensity level when exercising independently       Knowledge and understanding of Target Heart Rate Range (THRR) Yes       Intervention Provide education and explanation of THRR including how the numbers were predicted and where they are located for reference       Expected Outcomes Short Term: Able to state/look up THRR;Short Term: Able to use daily as guideline for intensity in rehab;Long Term: Able to use THRR to govern intensity when exercising independently       Understanding of Exercise Prescription Yes       Intervention Provide education, explanation, and written materials on patient's individual exercise prescription       Expected Outcomes Short Term: Able to explain program exercise prescription;Long Term: Able to explain home exercise prescription to exercise independently                Exercise Goals Re-Evaluation:  Exercise Goals Re-Evaluation     Row Name 12/21/21 1132 01/04/22 1107 01/06/22 1101 01/18/22 1046 02/01/22 1104     Exercise Goal Re-Evaluation   Exercise Goals Review Increase Physical Activity;Able to understand and use rate of perceived exertion (RPE) scale Increase Physical Activity;Able to understand and use rate of perceived exertion (RPE) scale;Increase Strength and Stamina Increase Physical Activity;Able to understand and use rate of perceived exertion (RPE) scale;Increase Strength and Stamina;Understanding of Exercise Prescription;Knowledge and understanding of Target Heart Rate Range (THRR);Able to check pulse independently Increase Physical Activity;Able to understand and use rate of perceived  exertion (RPE) scale;Increase Strength and Stamina;Understanding of Exercise Prescription;Knowledge and understanding of Target Heart Rate Range (THRR);Able to check pulse independently Increase Physical Activity;Able to understand and use rate of perceived exertion (RPE) scale;Increase Strength and Stamina;Understanding of Exercise Prescription;Knowledge and understanding of Target Heart Rate Range (THRR);Able to check pulse independently   Comments Patient able to understand and use RPE scale appropriately. Patient's goal is to get back in shape safely. Patient also wants to gain weight. His goal weight is 165-168 lbs. Patient is currently walking 20-30 minutes daily. Reviewed exercise prescription with patient. Patient is walking daily and is a member at [employer]. Patient knows how to check his pulse. Patient continues to progress well with exercise. Patient states this is the best he's felt in 5 years. Patient would like to increase workloads on NuStep and Arm ergometer next week. Increased WLs on SE and AE today and tolerated well. Patient  plans to continue exercise at local gym 5 days/week upon completion of the cardiac rehab program.   Expected Outcomes Progress workloads as tolerated to help increase cardiorespiratory fitness, improve heart rate response. Continue daily exercise routine. Continue to progress workloads as tolerated. Continue daily exercise routine. Continue to progress workloads as tolerated. Continue daily exercise routine. Continue to progress workloads as tolerated. Patient will continue daily exercise routine.            Nutrition & Weight - Outcomes:  Pre Biometrics - 12/17/21 0815       Pre Biometrics   Waist Circumference 31 inches    Hip Circumference 36.75 inches    Waist to Hip Ratio 0.84 %    Triceps Skinfold 7 mm    % Body Fat 17.8 %    Grip Strength 49 kg    Flexibility 14 in    Single Leg Stand 20.18 seconds             Post Biometrics -  02/03/22 1151        Post  Biometrics   Height 5' 9.5" (1.765 m)    Weight 68.8 kg    Waist Circumference 31.5 inches    Hip Circumference 35 inches    Waist to Hip Ratio 0.9 %    BMI (Calculated) 22.09    Triceps Skinfold 5 mm    % Body Fat 18.7 %    Grip Strength 49 kg    Flexibility 15 in    Single Leg Stand 30 seconds             Nutrition:  Nutrition Therapy & Goals - 01/18/22 1149       Nutrition Therapy   Diet Heart Healthy Diet    Drug/Food Interactions Statins/Certain Fruits      Personal Nutrition Goals   Nutrition Goal Patient to identify strategies for managing cardiovascular risk by attending the weekly [employer] education and nutrition courses    Personal Goal #2 Patient to identify strategies for weight gain of 0.5-2.0# per week.    Personal Goal #3 Patient to identify food sources of saturated fat, trans fat, sodium, and refined carbohyrates    Comments Eduard Clos is up 6.6# since starting with our program. He continues to eat a wide variety of foods with close attention to high fiber intake, reading food labels for sugar, and reading food labels for sodium. He plans to follow-up with his cardiologist at follow-up appointment on 12/5 regarding elevated triglycerides and elevated liver enzymes.      Intervention Plan   Intervention Prescribe, educate and counsel regarding individualized specific dietary modifications aiming towards targeted core components such as weight, hypertension, lipid management, diabetes, heart failure and other comorbidities.;Nutrition handout(s) given to patient.    Expected Outcomes Short Term Goal: Understand basic principles of dietary content, such as calories, fat, sodium, cholesterol and nutrients.;Short Term Goal: A plan has been developed with personal nutrition goals set during dietitian appointment.;Long Term Goal: Adherence to prescribed nutrition plan.             Nutrition Discharge:  Nutrition Assessments - 02/05/22  1051       Rate Your Plate Scores   Post Score 82             Education Questionnaire Score:  Knowledge Questionnaire Score - 02/05/22 1256       Knowledge Questionnaire Score   Pre Score 21/24    Post Score 22/24  Goals reviewed with patient; copy given to patient.Pt graduates from  Intensive/Traditional cardiac rehab program on 02/12/22 with completion of  18  exercise and  17 education sessions. Pt maintained good attendance and progressed nicely during their participation in rehab as evidenced by increased MET level.   Medication list reconciled. Repeat  PHQ score-0.  Pt has made significant lifestyle changes and should be commended for their success. [name] achieved his goals during cardiac rehab.   Pt plans to continue exercise at the gym and is considering volunteering in outpatient cardiac rehab. Eduard Clos says he feels a lot better since he has participated in the program. We are proud of [name]'s progress! Eduard Clos says he may consider being a volunteer in the future.Harrell Gave RN BSN

## 2022-02-15 ENCOUNTER — Encounter (HOSPITAL_COMMUNITY): Payer: Medicare Other

## 2022-02-17 ENCOUNTER — Encounter (HOSPITAL_COMMUNITY): Payer: Medicare Other

## 2022-03-03 ENCOUNTER — Ambulatory Visit (HOSPITAL_BASED_OUTPATIENT_CLINIC_OR_DEPARTMENT_OTHER)
Admission: RE | Admit: 2022-03-03 | Discharge: 2022-03-03 | Disposition: A | Discharge status: Home / Self Care | Payer: Medicare Other | Source: Ambulatory Visit | Attending: Cardiology | Admitting: Cardiology

## 2022-03-03 DIAGNOSIS — R0609 Other forms of dyspnea: Secondary | ICD-10-CM | POA: Insufficient documentation

## 2022-03-03 DIAGNOSIS — R072 Precordial pain: Secondary | ICD-10-CM | POA: Diagnosis not present

## 2022-03-03 LAB — ECHOCARDIOGRAM COMPLETE
AR max vel: 1.54 cm2
AV Area VTI: 1.47 cm2
AV Area mean vel: 1.42 cm2
AV Mean grad: 7 mmHg
AV Peak grad: 14.1 mmHg
Ao pk vel: 1.88 m/s
Area-P 1/2: 2.92 cm2
S' Lateral: 2.7 cm

## 2022-03-05 ENCOUNTER — Telehealth: Payer: Self-pay

## 2022-03-05 NOTE — Telephone Encounter (Signed)
Patient notified of results.

## 2022-03-05 NOTE — Telephone Encounter (Signed)
-----   Message from Park Liter, MD sent at 03/04/2022 10:24 AM EST ----- Echocardiogram showed normal left ejection fraction, arctic valve thickening but no significant stenosis.  Overall good results

## 2022-04-02 ENCOUNTER — Other Ambulatory Visit: Payer: Self-pay | Admitting: Cardiology

## 2022-04-05 DIAGNOSIS — H43392 Other vitreous opacities, left eye: Secondary | ICD-10-CM | POA: Diagnosis not present

## 2022-04-05 DIAGNOSIS — H35372 Puckering of macula, left eye: Secondary | ICD-10-CM | POA: Diagnosis not present

## 2022-04-05 DIAGNOSIS — H43812 Vitreous degeneration, left eye: Secondary | ICD-10-CM | POA: Diagnosis not present

## 2022-04-05 DIAGNOSIS — H353132 Nonexudative age-related macular degeneration, bilateral, intermediate dry stage: Secondary | ICD-10-CM | POA: Diagnosis not present

## 2022-04-07 ENCOUNTER — Other Ambulatory Visit: Payer: Self-pay | Admitting: Surgical

## 2022-04-26 DIAGNOSIS — L570 Actinic keratosis: Secondary | ICD-10-CM | POA: Diagnosis not present

## 2022-04-26 DIAGNOSIS — L218 Other seborrheic dermatitis: Secondary | ICD-10-CM | POA: Diagnosis not present

## 2022-04-26 DIAGNOSIS — L821 Other seborrheic keratosis: Secondary | ICD-10-CM | POA: Diagnosis not present

## 2022-04-26 DIAGNOSIS — L812 Freckles: Secondary | ICD-10-CM | POA: Diagnosis not present

## 2022-04-26 DIAGNOSIS — L308 Other specified dermatitis: Secondary | ICD-10-CM | POA: Diagnosis not present

## 2022-04-26 DIAGNOSIS — R21 Rash and other nonspecific skin eruption: Secondary | ICD-10-CM | POA: Diagnosis not present

## 2022-05-25 ENCOUNTER — Encounter (INDEPENDENT_AMBULATORY_CARE_PROVIDER_SITE_OTHER): Payer: Self-pay

## 2022-05-25 ENCOUNTER — Encounter (INDEPENDENT_AMBULATORY_CARE_PROVIDER_SITE_OTHER): Payer: Medicare Other | Admitting: Ophthalmology

## 2022-07-05 DIAGNOSIS — H43812 Vitreous degeneration, left eye: Secondary | ICD-10-CM | POA: Diagnosis not present

## 2022-07-05 DIAGNOSIS — H353132 Nonexudative age-related macular degeneration, bilateral, intermediate dry stage: Secondary | ICD-10-CM | POA: Diagnosis not present

## 2022-07-05 DIAGNOSIS — H35372 Puckering of macula, left eye: Secondary | ICD-10-CM | POA: Diagnosis not present

## 2022-07-05 DIAGNOSIS — H43392 Other vitreous opacities, left eye: Secondary | ICD-10-CM | POA: Diagnosis not present

## 2022-07-28 ENCOUNTER — Encounter: Payer: Self-pay | Admitting: Family Medicine

## 2022-07-28 ENCOUNTER — Ambulatory Visit (INDEPENDENT_AMBULATORY_CARE_PROVIDER_SITE_OTHER): Payer: Medicare Other | Admitting: Family Medicine

## 2022-07-28 VITALS — BP 130/80 | HR 84 | Temp 98.0°F | Wt 143.6 lb

## 2022-07-28 DIAGNOSIS — R252 Cramp and spasm: Secondary | ICD-10-CM

## 2022-07-28 DIAGNOSIS — D649 Anemia, unspecified: Secondary | ICD-10-CM

## 2022-07-28 DIAGNOSIS — Z951 Presence of aortocoronary bypass graft: Secondary | ICD-10-CM

## 2022-07-28 DIAGNOSIS — I10 Essential (primary) hypertension: Secondary | ICD-10-CM | POA: Diagnosis not present

## 2022-07-28 DIAGNOSIS — E785 Hyperlipidemia, unspecified: Secondary | ICD-10-CM | POA: Diagnosis not present

## 2022-07-28 DIAGNOSIS — R7989 Other specified abnormal findings of blood chemistry: Secondary | ICD-10-CM

## 2022-07-28 LAB — COMPREHENSIVE METABOLIC PANEL
ALT: 100 U/L — ABNORMAL HIGH (ref 0–53)
AST: 146 U/L — ABNORMAL HIGH (ref 0–37)
Albumin: 3.9 g/dL (ref 3.5–5.2)
Alkaline Phosphatase: 81 U/L (ref 39–117)
BUN: 13 mg/dL (ref 6–23)
CO2: 25 mEq/L (ref 19–32)
Calcium: 9.2 mg/dL (ref 8.4–10.5)
Chloride: 103 mEq/L (ref 96–112)
Creatinine, Ser: 0.99 mg/dL (ref 0.40–1.50)
GFR: 76.57 mL/min (ref >60.00)
Glucose, Bld: 121 mg/dL — ABNORMAL HIGH (ref 70–99)
Potassium: 3.6 mEq/L (ref 3.5–5.1)
Sodium: 136 mEq/L (ref 135–145)
Total Bilirubin: 1.4 mg/dL — ABNORMAL HIGH (ref 0.2–1.2)
Total Protein: 7.3 g/dL (ref 6.0–8.3)

## 2022-07-28 LAB — HEPATIC FUNCTION PANEL
ALT: 100 U/L — ABNORMAL HIGH (ref 0–53)
AST: 146 U/L — ABNORMAL HIGH (ref 0–37)
Albumin: 3.9 g/dL (ref 3.5–5.2)
Alkaline Phosphatase: 81 U/L (ref 39–117)
Bilirubin, Direct: 0.3 mg/dL (ref 0.0–0.3)
Total Bilirubin: 1.4 mg/dL — ABNORMAL HIGH (ref 0.2–1.2)
Total Protein: 7.3 g/dL (ref 6.0–8.3)

## 2022-07-28 LAB — LIPID PANEL
Cholesterol: 110 mg/dL (ref 0–200)
HDL: 24.4 mg/dL — ABNORMAL LOW (ref >39.00)
NonHDL: 85.28
Total CHOL/HDL Ratio: 4
Triglycerides: 379 mg/dL — ABNORMAL HIGH (ref 0.0–149.0)
VLDL: 75.8 mg/dL — ABNORMAL HIGH (ref 0.0–40.0)

## 2022-07-28 LAB — LDL CHOLESTEROL, DIRECT: Direct LDL: 25 mg/dL

## 2022-07-28 NOTE — Assessment & Plan Note (Signed)
Slight elevation from prior baseline last year, has not had repeat testing, asymptomatic.  Did discuss possible need to cut back on alcohol, but likely will have GI eval if persistent elevation.  Repeat testing today

## 2022-07-28 NOTE — Patient Instructions (Addendum)
Depending on liver tests, will likely refer you to gastroenterology.  May also need to cut back on alcohol if those liver tests are still elevated. Glad you are doing well otherwise.  If cramps do not continue to improve, follow up to discuss options with your meds.  Thank you for coming in today and take care.

## 2022-07-28 NOTE — Assessment & Plan Note (Signed)
Status post cardiac rehab, now exercising multiple days per week without symptoms, doing well, asymptomatic, continue follow-up with cardiology and continue same med regimen for now.

## 2022-07-28 NOTE — Progress Notes (Signed)
Subjective:  Patient ID: Timothy [name], male    DOB: Nov 23, 1950  Age: 72 y.o. MRN: [id number]  CC:  Chief Complaint  Patient presents with   Hypertension    Will need to recheck BP today   Hyperlipidemia    Patient would like to discuss his atorvastatin. He states he has had an increase in leg cramps at night.    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  Metoprolol adjusted to the Toprol-XL 25 mg dose.  Did not tolerate lisinopril in combination with metoprolol.  he is on high intensity statin.  Echo January 3, aortic valve thickening without stenosis.  EF 60 to 65%, no regional wall motion abnormalities.  Cardiologist Dr. Bing Matter.   Feels well. Back in gym 3-5 days per week. Weight has been improving with exercise - had lost 20-25# with heart issues, prior vaccine.  Home BP 120/60, no new med side effects. BP Readings from Last 3 Encounters:  07/28/22 130/80  02/12/22 132/62  02/02/22 136/74    Hyperlipidemia: Lipitor 80 mg daily. Some cramps at night at times - past few months. Drinking more water has helped.  Fasting today.  Lab Results  Component Value Date   CHOL 114 01/14/2022   HDL 36.90 (L) 01/14/2022   LDLCALC 80 09/28/2021   LDLDIRECT 49.0 01/14/2022   TRIG 299.0 (H) 01/14/2022   CHOLHDL 3 01/14/2022   Lab Results  Component Value Date   ALT 89 (H) 01/14/2022   AST 117 (H) 01/14/2022   ALKPHOS 95 01/14/2022   BILITOT 1.1 01/14/2022   Elevated LFTs Ultrasound 2021 with increased liver echogenicity, indicative of hepatic steatosis but no focal liver lesions.  CT abdomen 09/2020 - mild hepatic steatosis. Increased levels last November, recommended repeat testing within a week, has not yet been rechecked. GI: Dr. Marina Goodell, no recent visit.  No abd pain/n/v/jaundice or scleral icterus.  Brews  beers. 2-3 beers per night.  Lab Results  Component Value Date   ALT 89 (H) 01/14/2022   AST 117 (H) 01/14/2022   ALKPHOS 95 01/14/2022   BILITOT 1.1 01/14/2022   Anemia Previous low with transfusion last year, improved on repeat testing last fall. Lab Results  Component Value Date   WBC 3.9 (L) 01/14/2022   HGB 13.2 01/14/2022   HCT 38.2 (L) 01/14/2022   MCV 99.7 01/14/2022   PLT 86.0 (L) 01/14/2022     HM - utd.   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   NSTEMI (non-ST elevated myocardial infarction) (HCC) 09/27/2021   Hyperlipidemia 06/24/2021   Aortic atherosclerosis (HCC) 06/24/2021   Pain of left hip joint 05/23/2021   Pseudophakia, both eyes 11/18/2020   Pancytopenia (HCC) 10/17/2020   Hyponatremia 10/17/2020   Hypertension    Chronic kidney disease    COVID-19 01/2020   Pain in joint of right knee 11/28/2019   Steatosis of liver 08/24/2019   Bilateral hearing loss 08/24/2019   Hypertriglyceridemia 08/15/2019   Follow-up examination after eye surgery 07/19/2019   Right epiretinal membrane 07/19/2019   Left epiretinal membrane 07/19/2019   Posterior vitreous detachment of left eye 07/19/2019   Atypical chest pain 07/13/2019   Subacromial impingement 01/16/2019  Elevated LFTs 09/02/2018   Need for shingles vaccine 09/02/2018   Healthcare maintenance 08/22/2018   Need for influenza vaccination 12/22/2017   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/09/2017   Essential hypertension 04/07/2016   COLONIC POLYPS, HX OF 02/12/2009   Dyslipidemia 11/21/2007   Osteoarthritis 01/17/2007   NEPHROLITHIASIS, HX OF 01/17/2007    Past Medical History:  Diagnosis Date   Atypical chest pain 07/13/2019   Bilateral hearing loss 08/24/2019   Cataract    Cataract    Chronic kidney disease    kidney stones   Chronic pain of left knee 11/28/2019    COLONIC POLYPS, HX OF 02/12/2009   Coronary artery disease    COVID-19 01/2020   Dyslipidemia 11/21/2007   Qualifier: Diagnosis of  By: Amador Cunas  MD, Janett Labella    Elevated LFTs 09/02/2018   Essential hypertension 04/07/2016   Treatment initiated February 2018   Follow-up examination after eye surgery 07/19/2019   Healthcare maintenance 08/22/2018   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/09/2017   HYPERLIPIDEMIA 11/21/2007   Hypertension    Hypertriglyceridemia 08/15/2019   Left epiretinal membrane 07/19/2019   Need for influenza vaccination 12/22/2017   Need for shingles vaccine 09/02/2018   NEPHROLITHIASIS, HX OF 01/17/2007   OSTEOARTHRITIS 01/17/2007   Osteoarthritis 01/17/2007   Qualifier: Diagnosis of  By: Amador Cunas  MD, Janett Labella    Posterior vitreous detachment of left eye 07/19/2019   Right epiretinal membrane 07/19/2019   Steatosis of liver 08/24/2019   Subacromial impingement 01/16/2019      Review of Systems  Constitutional:  Negative for fatigue and unexpected weight change.  Eyes:  Negative for visual disturbance.  Respiratory:  Negative for cough, chest tightness and shortness of breath.   Cardiovascular:  Negative for chest pain, palpitations and leg swelling.  Gastrointestinal:  Negative for abdominal pain and blood in stool.  Neurological:  Negative for dizziness, light-headedness and headaches.     Objective:   Vitals:   07/28/22 0814 07/28/22 0832  BP: (!) 150/74 130/80  Pulse: 84   Temp: 98 F (36.7 C)   SpO2: 100%   Weight: 143 lb 9.6 oz (65.1 kg)      Physical Exam Vitals reviewed.  Constitutional:      Appearance: He is well-developed.  HENT:     Head: Normocephalic and atraumatic.  Neck:     Vascular: No carotid bruit or JVD.  Cardiovascular:     Rate and Rhythm: Normal rate and regular rhythm.     Heart sounds: Normal heart sounds. No murmur heard. Pulmonary:     Effort: Pulmonary effort is normal.     Breath sounds: Normal  breath sounds. No rales.  Musculoskeletal:     Right lower leg: No edema.     Left lower leg: No edema.  Skin:    General: Skin is warm and dry.  Neurological:     Mental Status: He is alert and oriented to person, place, and time.  Psychiatric:        Mood and Affect: Mood normal.        Assessment & Plan:  Wilferd Kawalec is a 72 y.o. male . Essential hypertension Assessment & Plan: Stable on home readings, likely component of whitecoat hypertension.  No med changes for now, RTC precautions if elevated readings.  Continue metoprolol.  Did not tolerate combination of metoprolol and lisinopril previously.   S/P CABG x 3 Assessment & Plan: Status post cardiac rehab, now exercising multiple days per  week without symptoms, doing well, asymptomatic, continue follow-up with cardiology and continue same med regimen for now.   Elevated LFTs Assessment & Plan: Slight elevation from prior baseline last year, has not had repeat testing, asymptomatic.  Did discuss possible need to cut back on alcohol, but likely will have GI eval if persistent elevation.  Repeat testing today  Orders: -     Hepatic function panel -     Comprehensive metabolic panel  Hyperlipidemia, unspecified hyperlipidemia type Assessment & Plan: Continue same dose Lipitor, high-dose given history of CAD status post CABG.  Intermittent muscle cramps as a new concern today, but those have improved with increase fluid intake.  RTC precautions if those return or worsen and could consider addition of co-Q10, adjustment of statin or look into other causes.  RTC precautions.  Orders: -     Comprehensive metabolic panel -     Lipid panel  Muscle cramp  Anemia, unspecified type -     CBC  Repeat CBC for anemia, normal on last testing.  Patient Instructions  Depending on liver tests, will likely refer you to gastroenterology.  May also need to cut back on alcohol if those liver tests are still elevated. Glad  you are doing well otherwise.  If cramps do not continue to improve, follow up to discuss options with your meds.  Thank you for coming in today and take care.     Signed,   Meredith Staggers, MD [facility], [facility] 07/28/22 8:35 AM

## 2022-07-28 NOTE — Assessment & Plan Note (Signed)
Continue same dose Lipitor, high-dose given history of CAD status post CABG.  Intermittent muscle cramps as a new concern today, but those have improved with increase fluid intake.  RTC precautions if those return or worsen and could consider addition of co-Q10, adjustment of statin or look into other causes.  RTC precautions.

## 2022-07-28 NOTE — Assessment & Plan Note (Signed)
Stable on home readings, likely component of whitecoat hypertension.  No med changes for now, RTC precautions if elevated readings.  Continue metoprolol.  Did not tolerate combination of metoprolol and lisinopril previously.

## 2022-08-10 ENCOUNTER — Other Ambulatory Visit: Payer: Self-pay | Admitting: Family Medicine

## 2022-08-10 DIAGNOSIS — R7989 Other specified abnormal findings of blood chemistry: Secondary | ICD-10-CM

## 2022-08-10 NOTE — Progress Notes (Signed)
See labs 

## 2022-08-11 ENCOUNTER — Ambulatory Visit: Payer: Medicare Other | Attending: Cardiology | Admitting: Cardiology

## 2022-08-11 ENCOUNTER — Encounter: Payer: Self-pay | Admitting: Cardiology

## 2022-08-11 VITALS — BP 126/60 | HR 78 | Ht 70.0 in | Wt 149.0 lb

## 2022-08-11 DIAGNOSIS — E782 Mixed hyperlipidemia: Secondary | ICD-10-CM | POA: Diagnosis not present

## 2022-08-11 DIAGNOSIS — I10 Essential (primary) hypertension: Secondary | ICD-10-CM | POA: Diagnosis not present

## 2022-08-11 DIAGNOSIS — Z951 Presence of aortocoronary bypass graft: Secondary | ICD-10-CM | POA: Diagnosis not present

## 2022-08-11 MED ORDER — ATORVASTATIN CALCIUM 40 MG PO TABS: 40.0000 mg | ORAL_TABLET | Freq: Every day | ORAL | 3 refills | Status: DC

## 2022-08-11 NOTE — Addendum Note (Signed)
Addended by: Roxanne Mins I on: 08/11/2022 08:56 AM   Modules accepted: Orders

## 2022-08-11 NOTE — Patient Instructions (Addendum)
Medication Instructions:  Your physician has recommended you make the following change in your medication:   START: Lipitor 40 mg daily  *If you need a refill on your cardiac medications before your next appointment, please call your pharmacy*   Lab Work: Your physician recommends that you return for lab work in:   Labs in 1 month: Lipids, LFT  If you have labs (blood work) drawn today and your tests are completely normal, you will receive your results only by: MyChart Message (if you have MyChart) OR A paper copy in the mail If you have any lab test that is abnormal or we need to change your treatment, we will call you to review the results.   Testing/Procedures: None   Follow-Up: At [facility], you and your health needs are our priority.  As part of our continuing mission to provide you with exceptional heart care, we have created designated Provider Care Teams.  These Care Teams include your primary Cardiologist (physician) and Advanced Practice Providers (APPs -  Physician Assistants and Nurse Practitioners) who all work together to provide you with the care you need, when you need it.  We recommend signing up for the patient portal called "MyChart".  Sign up information is provided on this After Visit Summary.  MyChart is used to connect with patients for Virtual Visits (Telemedicine).  Patients are able to view lab/test results, encounter notes, upcoming appointments, etc.  Non-urgent messages can be sent to your provider as well.   To learn more about what you can do with MyChart, go to [email or web address].    Your next appointment:   6 month(s)  Provider:   Gypsy Balsam, MD    Other Instructions None

## 2022-08-11 NOTE — Progress Notes (Signed)
Cardiology Office Note:    Date:  08/11/2022   ID:  Timothy [name], DOB Jan 07, 1951, MRN [id number]  PCP:  Shade Flood, MD  Cardiologist:  Gypsy Balsam, MD    Referring MD: Shade Flood, MD   Chief Complaint  Patient presents with   Follow-up  Doing great, feeling well  History of Present Illness:    Timothy [name] is a 72 y.o. male past medical history significant for coronary artery disease, in August 2023 he required coronary artery bypass graft with LIMA to LAD SVG to circumflex, SVG to posterior descending that was in face of non-STEMI, likely left ventricle ejection fraction is preserved, additional problem include essential hypertension, dyslipidemia recently liver function test elevation. Comes today to months for follow-up overall doing great, asymptomatic, no chest pain tightness squeezing pressure burning chest exercise on the regular basis concern is his liver function test being elevated.  Past Medical History:  Diagnosis Date   Atypical chest pain 07/13/2019   Bilateral hearing loss 08/24/2019   Cataract    Cataract    Chronic kidney disease    kidney stones   Chronic pain of left knee 11/28/2019   COLONIC POLYPS, HX OF 02/12/2009   Coronary artery disease    COVID-19 01/2020   Dyslipidemia 11/21/2007   Qualifier: Diagnosis of  By: Amador Cunas  MD, Janett Labella    Elevated LFTs 09/02/2018   Essential hypertension 04/07/2016   Treatment initiated February 2018   Follow-up examination after eye surgery 07/19/2019   Healthcare maintenance 08/22/2018   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/09/2017   HYPERLIPIDEMIA 11/21/2007   Hypertension    Hypertriglyceridemia 08/15/2019   Left epiretinal membrane 07/19/2019   Need for influenza vaccination 12/22/2017   Need for shingles vaccine 09/02/2018   NEPHROLITHIASIS, HX OF 01/17/2007   OSTEOARTHRITIS 01/17/2007   Osteoarthritis 01/17/2007   Qualifier: Diagnosis of  By: Amador Cunas  MD, Janett Labella     Posterior vitreous detachment of left eye 07/19/2019   Right epiretinal membrane 07/19/2019   Steatosis of liver 08/24/2019   Subacromial impingement 01/16/2019    Past Surgical History:  Procedure Laterality Date   APPENDECTOMY     CARDIAC CATHETERIZATION     CARPAL TUNNEL RELEASE Right    CATARACT EXTRACTION Bilateral    june 2019   CORONARY ARTERY BYPASS GRAFT N/A 09/30/2021   Procedure: CORONARY ARTERY BYPASS GRAFTING (CABG) X 3 BYPASSES USING LEFT INTERNAL MAMMARY ARTERY AND RIGHT LEG ENDOHARVESTED SAPHENOUS VEIN GRAFT;  Surgeon: Lovett Sox, MD;  Location: [facility] OR;  Service: Open Heart Surgery;  Laterality: N/A;   HERNIA REPAIR     ingunial   JOINT REPLACEMENT     KNEE SURGERY Right    x 4   LEFT HEART CATH AND CORONARY ANGIOGRAPHY N/A 09/28/2021   Procedure: LEFT HEART CATH AND CORONARY ANGIOGRAPHY;  Surgeon: Runell Gess, MD;  Location: [facility] INVASIVE CV LAB;  Service: Cardiovascular;  Laterality: N/A;   MENISCUS REPAIR Left    knee; x 3   REPLACEMENT TOTAL KNEE Right    SHOULDER SURGERY     left   TEE WITHOUT CARDIOVERSION N/A 09/30/2021   Procedure: TRANSESOPHAGEAL ECHOCARDIOGRAM (TEE);  Surgeon: Lovett Sox, MD;  Location: [facility] OR;  Service: Open Heart Surgery;  Laterality: N/A;   TOTAL KNEE ARTHROPLASTY     right    Current Medications: Current Meds  Medication Sig   acetaminophen (TYLENOL) 325 MG tablet Take 2 tablets (650 mg total) by mouth every  6 (six) hours as needed for mild pain or moderate pain.   atorvastatin (LIPITOR) 80 MG tablet Take 1 tablet (80 mg total) by mouth daily.   meloxicam (MOBIC) 7.5 MG tablet Take 1 tablet (7.5 mg total) by mouth daily as needed for pain.   metoprolol succinate (TOPROL-XL) 25 MG 24 hr tablet Take 1 tablet (25 mg total) by mouth daily. Take with or immediately following a meal.   Multiple Vitamin (MULTIVITAMIN) tablet Take 1 tablet by mouth daily. Unknown strength   triamcinolone cream (KENALOG) 0.1 % Apply 1  Application topically daily as needed (For rash).     Allergies:   Patient has no known allergies.   Social History   Socioeconomic History   Marital status: Married    Spouse name: Not on file   Number of children: 2   Years of education: 16   Highest education level: Bachelor's degree (e.g., BA, AB, BS)  Occupational History   Not on file  Tobacco Use   Smoking status: Never   Smokeless tobacco: Never  Vaping Use   Vaping Use: Never used  Substance and Sexual Activity   Alcohol use: Yes    Alcohol/week: 1.0 standard drink of alcohol    Types: 1 Cans of beer per week    Comment: 1-2 beers a day   Drug use: Never   Sexual activity: Not Currently  Other Topics Concern   Not on file  Social History Narrative   ** Merged History Encounter **       Social Determinants of Health   Financial Resource Strain: Low Risk  (02/10/2022)   Overall Financial Resource Strain (CARDIA)    Difficulty of Paying Living Expenses: Not hard at all  Food Insecurity: No Food Insecurity (02/10/2022)   Hunger Vital Sign    Worried About Running Out of Food in the Last Year: Never true    Ran Out of Food in the Last Year: Never true  Transportation Needs: No Transportation Needs (02/10/2022)   PRAPARE - Administrator, Civil Service (Medical): No    Lack of Transportation (Non-Medical): No  Physical Activity: Sufficiently Active (02/10/2022)   Exercise Vital Sign    Days of Exercise per Week: 5 days    Minutes of Exercise per Session: 60 min  Stress: No Stress Concern Present (02/10/2022)   [employer] of Occupational Health - Occupational Stress Questionnaire    Feeling of Stress : Not at all  Social Connections: Socially Integrated (02/10/2022)   Social Connection and Isolation Panel [NHANES]    Frequency of Communication with Friends and Family: More than three times a week    Frequency of Social Gatherings with Friends and Family: Twice a week    Attends Religious  Services: More than 4 times per year    Active Member of [employer] or Organizations: Yes    Attends Engineer, structural: More than 4 times per year    Marital Status: Married     Family History: The patient's family history includes Colon cancer (age of onset: 51) in his father. There is no history of Esophageal cancer, Rectal cancer, Stomach cancer, Pancreatic cancer, or Liver disease. ROS:   Please see the history of present illness.    All 14 point review of systems negative except as described per history of present illness  EKGs/Labs/Other Studies Reviewed:      Recent Labs: 09/29/2021: TSH 1.440 10/01/2021: Magnesium 2.1 01/14/2022: Hemoglobin 13.2; Platelets 86.0 07/28/2022: ALT 100; ALT  100; BUN 13; Creatinine, Ser 0.99; Potassium 3.6; Sodium 136  Recent Lipid Panel    Component Value Date/Time   CHOL 110 07/28/2022 0833   CHOL 212 (H) 03/16/2021 0903   TRIG 379.0 (H) 07/28/2022 0833   HDL 24.40 (L) 07/28/2022 0833   HDL 64 03/16/2021 0903   CHOLHDL 4 07/28/2022 0833   VLDL 75.8 (H) 07/28/2022 0833   LDLCALC 80 09/28/2021 0235   LDLCALC 124 (H) 03/16/2021 0903   LDLCALC 61 08/22/2018 1109   LDLDIRECT 25.0 07/28/2022 0833    Physical Exam:    VS:  BP 126/60 (BP Location: Left Arm, Patient Position: Sitting)   Pulse 78   Ht 5\' 10"  (1.778 m)   Wt 149 lb (67.6 kg)   SpO2 98%   BMI 21.38 kg/m     Wt Readings from Last 3 Encounters:  08/11/22 149 lb (67.6 kg)  07/28/22 143 lb 9.6 oz (65.1 kg)  02/12/22 151 lb 0.2 oz (68.5 kg)     GEN:  Well nourished, well developed in no acute distress HEENT: Normal NECK: No JVD; No carotid bruits LYMPHATICS: No lymphadenopathy CARDIAC: RRR, no murmurs, no rubs, no gallops RESPIRATORY:  Clear to auscultation without rales, wheezing or rhonchi  ABDOMEN: Soft, non-tender, non-distended MUSCULOSKELETAL:  No edema; No deformity  SKIN: Warm and dry LOWER EXTREMITIES: no swelling NEUROLOGIC:  Alert and oriented x  3 PSYCHIATRIC:  Normal affect   ASSESSMENT:    1. S/P CABG x 3   2. Mixed hyperlipidemia   3. Essential hypertension    PLAN:    In order of problems listed above:  Coronary disease stable from that point review and appropriate guideline directed medical therapy. Dyslipidemia he is taking high intensity statin, direct LDL is 25, will reduce Lipitor to only 40 mg daily.  Fasting lipid profile will be done 6 weeks. Essential hypertension blood pressure well-controlled continue present management. Elevation of liver function test will reduce statin,   Medication Adjustments/Labs and Tests Ordered: Current medicines are reviewed at length with the patient today.  Concerns regarding medicines are outlined above.  No orders of the defined types were placed in this encounter.  Medication changes: No orders of the defined types were placed in this encounter.   Signed, Georgeanna Lea, MD, [facility] 08/11/2022 8:18 AM    [facility]

## 2022-10-26 ENCOUNTER — Telehealth: Payer: Self-pay | Admitting: Family Medicine

## 2022-10-26 ENCOUNTER — Encounter: Payer: Self-pay | Admitting: Internal Medicine

## 2022-10-26 DIAGNOSIS — Z1211 Encounter for screening for malignant neoplasm of colon: Secondary | ICD-10-CM

## 2022-10-26 NOTE — Telephone Encounter (Signed)
Chart reviewed, last colonoscopy in December 2019.  Dr. Marina Goodell.  5-year repeat.  I will place a referral but may not need actual referral if he is already established with gastroenterologist, would recommend he call their office.  Thanks.

## 2022-10-26 NOTE — Telephone Encounter (Signed)
Called patient. Patient stated he already called Dr. Marina Goodell and he is schedule in December for an appt.  Clarnce Flock CMA Student

## 2022-10-26 NOTE — Telephone Encounter (Signed)
Caller name: Rees Fatemi  On DPR?: Yes  Call back number: [phone] (mobile)  Provider they see: Shade Flood, MD  Reason for call: Pt is requesting a referral for a Colon screening/Colonoscopy.  His LOV 07/23/22 and last AWV 01/28/22

## 2022-11-27 ENCOUNTER — Other Ambulatory Visit: Payer: Self-pay

## 2022-11-27 ENCOUNTER — Ambulatory Visit
Admission: EM | Admit: 2022-11-27 | Discharge: 2022-11-27 | Disposition: A | Discharge status: Home / Self Care | Payer: Medicare Other | Attending: Internal Medicine | Admitting: Internal Medicine

## 2022-11-27 ENCOUNTER — Encounter: Payer: Self-pay | Admitting: *Deleted

## 2022-11-27 DIAGNOSIS — L299 Pruritus, unspecified: Secondary | ICD-10-CM

## 2022-11-27 DIAGNOSIS — L249 Irritant contact dermatitis, unspecified cause: Secondary | ICD-10-CM

## 2022-11-27 MED ORDER — HYDROXYZINE HCL 25 MG PO TABS: 12.5000 mg | ORAL_TABLET | Freq: Three times a day (TID) | ORAL | 0 refills | Status: DC | PRN

## 2022-11-27 MED ORDER — BETAMETHASONE DIPROPIONATE 0.05 % EX OINT: TOPICAL_OINTMENT | Freq: Two times a day (BID) | CUTANEOUS | 0 refills | Status: DC

## 2022-11-27 NOTE — ED Provider Notes (Signed)
[name]  Note:  This document was prepared using Conservation officer, historic buildings and may include unintentional dictation errors.  MRN: [id number] DOB: 27-Jan-1951  Subjective:   Timothy [name] is a 72 y.o. male presenting for 10-day history of itchy rash over the left extremity.  Patient reports that he suffered 2 different puncture wounds.  Believes he was bitten by a spider over the left dorsal aspect of the forearm.  Over the upper left arm, suffered a scratch from his dog who climbs on them regularly.  The rash has gotten better over the past 10 days but continues to be itchy.  No fever, tenderness, drainage of pus or bleeding.  Has been using multiple topical treatments including bacitracin, peroxide, rubbing alcohol.  No current facility-administered medications for this encounter.  Current Outpatient Medications:    atorvastatin (LIPITOR) 40 MG tablet, Take 1 tablet (40 mg total) by mouth daily., Disp: 90 tablet, Rfl: 3   metoprolol succinate (TOPROL-XL) 25 MG 24 hr tablet, Take 1 tablet (25 mg total) by mouth daily. Take with or immediately following a meal., Disp: 90 tablet, Rfl: 3   Multiple Vitamin (MULTIVITAMIN) tablet, Take 1 tablet by mouth daily. Unknown strength, Disp: , Rfl:    acetaminophen (TYLENOL) 325 MG tablet, Take 2 tablets (650 mg total) by mouth every 6 (six) hours as needed for mild pain or moderate pain., Disp: , Rfl:    meloxicam (MOBIC) 7.5 MG tablet, Take 1 tablet (7.5 mg total) by mouth daily as needed for pain., Disp: 30 tablet, Rfl: 0   triamcinolone cream (KENALOG) 0.1 %, Apply 1 Application topically daily as needed (For rash)., Disp: , Rfl:    No Known Allergies  Past Medical History:  Diagnosis Date   Atypical chest pain 07/13/2019   Bilateral hearing loss 08/24/2019   Cataract    Cataract    Chronic kidney disease    kidney stones   Chronic pain of left knee 11/28/2019   COLONIC POLYPS, HX OF 02/12/2009    Coronary artery disease    COVID-19 01/2020   Dyslipidemia 11/21/2007   Qualifier: Diagnosis of  By: Amador Cunas  MD, Janett Labella    Elevated LFTs 09/02/2018   Essential hypertension 04/07/2016   Treatment initiated February 2018   Follow-up examination after eye surgery 07/19/2019   Healthcare maintenance 08/22/2018   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/09/2017   HYPERLIPIDEMIA 11/21/2007   Hypertension    Hypertriglyceridemia 08/15/2019   Left epiretinal membrane 07/19/2019   Need for influenza vaccination 12/22/2017   Need for shingles vaccine 09/02/2018   NEPHROLITHIASIS, HX OF 01/17/2007   OSTEOARTHRITIS 01/17/2007   Osteoarthritis 01/17/2007   Qualifier: Diagnosis of  By: Amador Cunas  MD, Janett Labella    Posterior vitreous detachment of left eye 07/19/2019   Right epiretinal membrane 07/19/2019   Steatosis of liver 08/24/2019   Subacromial impingement 01/16/2019     Past Surgical History:  Procedure Laterality Date   APPENDECTOMY     CARDIAC CATHETERIZATION     CARPAL TUNNEL RELEASE Right    CATARACT EXTRACTION Bilateral    june 2019   CORONARY ARTERY BYPASS GRAFT N/A 09/30/2021   Procedure: CORONARY ARTERY BYPASS GRAFTING (CABG) X 3 BYPASSES USING LEFT INTERNAL MAMMARY ARTERY AND RIGHT LEG ENDOHARVESTED SAPHENOUS VEIN GRAFT;  Surgeon: Lovett Sox, MD;  Location: [facility] OR;  Service: Open Heart Surgery;  Laterality: N/A;   HERNIA REPAIR     ingunial   JOINT REPLACEMENT  KNEE SURGERY Right    x 4   LEFT HEART CATH AND CORONARY ANGIOGRAPHY N/A 09/28/2021   Procedure: LEFT HEART CATH AND CORONARY ANGIOGRAPHY;  Surgeon: Runell Gess, MD;  Location: [facility];  Service: Cardiovascular;  Laterality: N/A;   MENISCUS REPAIR Left    knee; x 3   REPLACEMENT TOTAL KNEE Right    SHOULDER SURGERY     left   TEE WITHOUT CARDIOVERSION N/A 09/30/2021   Procedure: TRANSESOPHAGEAL ECHOCARDIOGRAM (TEE);  Surgeon: Lovett Sox, MD;  Location: [facility] OR;  Service: Open Heart  Surgery;  Laterality: N/A;   TOTAL KNEE ARTHROPLASTY     right    Family History  Problem Relation Age of Onset   Colon cancer Father 68   Esophageal cancer Neg Hx    Rectal cancer Neg Hx    Stomach cancer Neg Hx    Pancreatic cancer Neg Hx    Liver disease Neg Hx     Social History   Tobacco Use   Smoking status: Never   Smokeless tobacco: Never  Vaping Use   Vaping status: Never Used  Substance Use Topics   Alcohol use: Yes    Alcohol/week: 10.0 standard drinks of alcohol    Types: 10 Cans of beer per week    Comment: 1-2 beers a day   Drug use: Never    ROS   Objective:   Vitals: BP (!) 143/76   Pulse 70   Temp 98.1 F (36.7 C) (Oral)   Resp 14   SpO2 97%   Physical Exam Constitutional:      General: He is not in acute distress.    Appearance: Normal appearance. He is well-developed and normal weight. He is not ill-appearing, toxic-appearing or diaphoretic.  HENT:     Head: Normocephalic and atraumatic.     Right Ear: External ear normal.     Left Ear: External ear normal.     Nose: Nose normal.     Mouth/Throat:     Pharynx: Oropharynx is clear.  Eyes:     General: No scleral icterus.       Right eye: No discharge.        Left eye: No discharge.     Extraocular Movements: Extraocular movements intact.  Cardiovascular:     Rate and Rhythm: Normal rate.  Pulmonary:     Effort: Pulmonary effort is normal.  Musculoskeletal:     Cervical back: Normal range of motion.  Skin:      Neurological:     Mental Status: He is alert and oriented to person, place, and time.  Psychiatric:        Mood and Affect: Mood normal.        Behavior: Behavior normal.        Thought Content: Thought content normal.        Judgment: Judgment normal.          Assessment and Plan :   PDMP not reviewed this encounter.  1. Irritant dermatitis   2. Itching    Recommended topical betamethasone for his irritant dermatitis.  No signs of cellulitis or  secondary infection.  Use hydroxyzine as needed for itching.  Counseled patient on potential for adverse effects with medications prescribed/recommended today, ER and return-to-clinic precautions discussed, patient verbalized understanding.    Wallis Bamberg, [place] 11/27/22 1610

## 2022-11-27 NOTE — ED Triage Notes (Signed)
Reports getting scratched by his hound dog approx 2 wks ago causing some bruising. Approx 10 days ago spouse noted small bumps to surrounding area. Since then, is starting to get other areas on forearm with same rash. C/O pruritus and feels irritated. Has tried applying Bacitracin, H2O2, A&D oint, rubbing alcohol. Today started with general malaise.

## 2022-11-29 DIAGNOSIS — L239 Allergic contact dermatitis, unspecified cause: Secondary | ICD-10-CM | POA: Diagnosis not present

## 2023-01-10 ENCOUNTER — Ambulatory Visit (AMBULATORY_SURGERY_CENTER): Payer: Medicare Other

## 2023-01-10 VITALS — Ht 70.0 in | Wt 165.0 lb

## 2023-01-10 DIAGNOSIS — Z860100 Personal history of colon polyps, unspecified: Secondary | ICD-10-CM

## 2023-01-10 MED ORDER — NA SULFATE-K SULFATE-MG SULF 17.5-3.13-1.6 GM/177ML PO SOLN
1.0000 | Freq: Once | ORAL | 0 refills | Status: AC
Start: 2023-01-10 — End: 2023-01-10

## 2023-01-10 NOTE — Progress Notes (Signed)

## 2023-01-15 ENCOUNTER — Other Ambulatory Visit: Payer: Self-pay | Admitting: Cardiology

## 2023-01-31 ENCOUNTER — Ambulatory Visit (AMBULATORY_SURGERY_CENTER): Payer: Medicare Other | Admitting: Internal Medicine

## 2023-01-31 ENCOUNTER — Encounter: Payer: Self-pay | Admitting: Internal Medicine

## 2023-01-31 VITALS — BP 142/74 | HR 80 | Temp 98.2°F | Resp 12 | Ht 70.0 in | Wt 165.0 lb

## 2023-01-31 DIAGNOSIS — K6389 Other specified diseases of intestine: Secondary | ICD-10-CM

## 2023-01-31 DIAGNOSIS — I10 Essential (primary) hypertension: Secondary | ICD-10-CM | POA: Diagnosis not present

## 2023-01-31 DIAGNOSIS — K648 Other hemorrhoids: Secondary | ICD-10-CM | POA: Diagnosis not present

## 2023-01-31 DIAGNOSIS — Z860101 Personal history of adenomatous and serrated colon polyps: Secondary | ICD-10-CM

## 2023-01-31 DIAGNOSIS — Z860100 Personal history of colon polyps, unspecified: Secondary | ICD-10-CM

## 2023-01-31 DIAGNOSIS — Z800 Family history of malignant neoplasm of digestive organs: Secondary | ICD-10-CM

## 2023-01-31 DIAGNOSIS — E785 Hyperlipidemia, unspecified: Secondary | ICD-10-CM | POA: Diagnosis not present

## 2023-01-31 DIAGNOSIS — Z1211 Encounter for screening for malignant neoplasm of colon: Secondary | ICD-10-CM | POA: Diagnosis not present

## 2023-01-31 MED ORDER — SODIUM CHLORIDE 0.9 % IV SOLN
500.0000 mL | Freq: Once | INTRAVENOUS | Status: DC
Start: 2023-01-31 — End: 2023-01-31

## 2023-01-31 NOTE — Progress Notes (Signed)
Pt's states no medical or surgical changes since previsit or office visit. 

## 2023-01-31 NOTE — Progress Notes (Signed)
HISTORY OF PRESENT ILLNESS:  Timothy [name] is a 72 y.o. male with a history of sessile serrated polyp and family history of colon cancer.  Now for colonoscopy (surveillance)  REVIEW OF SYSTEMS:  All non-GI ROS negative except for  Past Medical History:  Diagnosis Date   Atypical chest pain 07/13/2019   Bilateral hearing loss 08/24/2019   Cataract    Cataract    Chronic kidney disease    kidney stones   Chronic pain of left knee 11/28/2019   COLONIC POLYPS, HX OF 02/12/2009   Coronary artery disease    COVID-19 01/2020   Dyslipidemia 11/21/2007   Qualifier: Diagnosis of  By: Amador Cunas  MD, Janett Labella    Elevated LFTs 09/02/2018   Essential hypertension 04/07/2016   Treatment initiated February 2018   Follow-up examination after eye surgery 07/19/2019   Healthcare maintenance 08/22/2018   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/09/2017   HYPERLIPIDEMIA 11/21/2007   Hypertension    Hypertriglyceridemia 08/15/2019   Left epiretinal membrane 07/19/2019   Need for influenza vaccination 12/22/2017   Need for shingles vaccine 09/02/2018   NEPHROLITHIASIS, HX OF 01/17/2007   OSTEOARTHRITIS 01/17/2007   Osteoarthritis 01/17/2007   Qualifier: Diagnosis of  By: Amador Cunas  MD, Janett Labella    Posterior vitreous detachment of left eye 07/19/2019   Right epiretinal membrane 07/19/2019   Steatosis of liver 08/24/2019   Subacromial impingement 01/16/2019    Past Surgical History:  Procedure Laterality Date   APPENDECTOMY     CARDIAC CATHETERIZATION     CARPAL TUNNEL RELEASE Right    CATARACT EXTRACTION Bilateral    june 2019   CORONARY ARTERY BYPASS GRAFT N/A 09/30/2021   Procedure: CORONARY ARTERY BYPASS GRAFTING (CABG) X 3 BYPASSES USING LEFT INTERNAL MAMMARY ARTERY AND RIGHT LEG ENDOHARVESTED SAPHENOUS VEIN GRAFT;  Surgeon: Lovett Sox, MD;  Location: [facility] OR;  Service: Open Heart Surgery;  Laterality: N/A;   HERNIA REPAIR     ingunial   JOINT REPLACEMENT     KNEE SURGERY  Right    x 4   LEFT HEART CATH AND CORONARY ANGIOGRAPHY N/A 09/28/2021   Procedure: LEFT HEART CATH AND CORONARY ANGIOGRAPHY;  Surgeon: Runell Gess, MD;  Location: [facility];  Service: Cardiovascular;  Laterality: N/A;   MENISCUS REPAIR Left    knee; x 3   REPLACEMENT TOTAL KNEE Right    SHOULDER SURGERY     left   TEE WITHOUT CARDIOVERSION N/A 09/30/2021   Procedure: TRANSESOPHAGEAL ECHOCARDIOGRAM (TEE);  Surgeon: Lovett Sox, MD;  Location: [facility] OR;  Service: Open Heart Surgery;  Laterality: N/A;   TOTAL KNEE ARTHROPLASTY     right    Social History Timothy [name]  reports that he has never smoked. He has never used smokeless tobacco. He reports current alcohol use of about 10.0 standard drinks of alcohol per week. He reports that he does not use drugs.  family history includes Colon cancer (age of onset: 41) in his father.  No Known Allergies     PHYSICAL EXAMINATION: Vital signs: BP (!) 155/73   Pulse 87   Temp 98.2 F (36.8 C) (Temporal)   Ht 5\' 10"  (1.778 m)   Wt 165 lb (74.8 kg)   SpO2 96%   BMI 23.68 kg/m  General: Well-developed, well-nourished, no acute distress HEENT: Sclerae are anicteric, conjunctiva pink. Oral mucosa intact Lungs: Clear Heart: Regular Abdomen: soft, nontender, nondistended, no obvious ascites, no peritoneal signs, normal bowel sounds. No organomegaly.  Extremities: No edema Psychiatric: alert and oriented x3. Cooperative     ASSESSMENT:   History of SSP and family history of colon cancer  PLAN:   Surveillance colonoscopy

## 2023-01-31 NOTE — Op Note (Signed)
[facility] Patient Name: Timothy [name] Procedure Date: 01/31/2023 8:51 AM MRN: [id number] Endoscopist: Wilhemina Bonito. Marina Goodell , MD, [id number] Age: 72 Referring MD:  Date of Birth: [birth year] Gender: Male Account #: [id number] Procedure:                Colonoscopy Indications:              High risk colon cancer surveillance: Personal                            history of sessile serrated colon polyp (less than                            10 mm in size) with no dysplasia. Parents with                            colon cancer around age 68. Previous examinations                            2004, 2009, 2014, 2019 Medicines:                Monitored Anesthesia Care Procedure:                Pre-Anesthesia Assessment:                           - Prior to the procedure, a History and Physical                            was performed, and patient medications and                            allergies were reviewed. The patient's tolerance of                            previous anesthesia was also reviewed. The risks                            and benefits of the procedure and the sedation                            options and risks were discussed with the patient.                            All questions were answered, and informed consent                            was obtained. Prior Anticoagulants: The patient has                            taken no anticoagulant or antiplatelet agents.                            After reviewing the risks and benefits, the patient  was deemed in satisfactory condition to undergo the                            procedure.                           After obtaining informed consent, the colonoscope                            was passed under direct vision. Throughout the                            procedure, the patient's blood pressure, pulse, and                            oxygen saturations were monitored continuously. The                             Olympus Scope [id number] was introduced through the                            anus and advanced to the the cecum, identified by                            appendiceal orifice and ileocecal valve. The                            ileocecal valve, appendiceal orifice, and rectum                            were photographed. The quality of the bowel                            preparation was excellent. The colonoscopy was                            performed without difficulty. The patient tolerated                            the procedure well. The bowel preparation used was                            SUPREP via split dose instruction. Scope In: 9:20:03 AM Scope Out: 9:30:49 AM Scope Withdrawal Time: 0 hours 6 minutes 56 seconds  Total Procedure Duration: 0 hours 10 minutes 46 seconds  Findings:                 Internal hemorrhoids were found during retroflexion.                           In the region of the cecum, upon entering, there                            were mucosal changes consistent with barotrauma.  The exam was otherwise without abnormality on                            direct and retroflexion views. No neoplasia. Complications:            No immediate complications. Estimated blood loss:                            None. Estimated Blood Loss:     Estimated blood loss: none. Impression:               - Internal hemorrhoids.                           - The examination was otherwise normal on direct                            and retroflexion views.                           - No specimens collected. Recommendation:           - Repeat colonoscopy is not recommended for                            surveillance.                           - Patient has a contact number available for                            emergencies. The signs and symptoms of potential                            delayed complications were discussed with the                             patient. Return to normal activities tomorrow.                            Written discharge instructions were provided to the                            patient.                           - Resume previous diet.                           - Continue present medications. Wilhemina Bonito. Marina Goodell, MD 01/31/2023 9:36:36 AM This report has been signed electronically.

## 2023-01-31 NOTE — Patient Instructions (Signed)
Discharge instructions given. Handout on Hemorrhoids. Resume previous medications. YOU HAD AN ENDOSCOPIC PROCEDURE TODAY AT THE [facility]:   Refer to the procedure report that was given to you for any specific questions about what was found during the examination.  If the procedure report does not answer your questions, please call your gastroenterologist to clarify.  If you requested that your care partner not be given the details of your procedure findings, then the procedure report has been included in a sealed envelope for you to review at your convenience later.  YOU SHOULD EXPECT: Some feelings of bloating in the abdomen. Passage of more gas than usual.  Walking can help get rid of the air that was put into your GI tract during the procedure and reduce the bloating. If you had a lower endoscopy (such as a colonoscopy or flexible sigmoidoscopy) you may notice spotting of blood in your stool or on the toilet paper. If you underwent a bowel prep for your procedure, you may not have a normal bowel movement for a few days.  Please Note:  You might notice some irritation and congestion in your nose or some drainage.  This is from the oxygen used during your procedure.  There is no need for concern and it should clear up in a day or so.  SYMPTOMS TO REPORT IMMEDIATELY:  Following lower endoscopy (colonoscopy or flexible sigmoidoscopy):  Excessive amounts of blood in the stool  Significant tenderness or worsening of abdominal pains  Swelling of the abdomen that is new, acute  Fever of 100F or higher   For urgent or emergent issues, a gastroenterologist can be reached at any hour by calling [phone]. Do not use MyChart messaging for urgent concerns.    DIET:  We do recommend a small meal at first, but then you may proceed to your regular diet.  Drink plenty of fluids but you should avoid alcoholic beverages for 24 hours.  ACTIVITY:  You should plan to take it easy for the  rest of today and you should NOT DRIVE or use heavy machinery until tomorrow (because of the sedation medicines used during the test).    FOLLOW UP: Our staff will call the number listed on your records the next business day following your procedure.  We will call around 7:15- 8:00 am to check on you and address any questions or concerns that you may have regarding the information given to you following your procedure. If we do not reach you, we will leave a message.     If any biopsies were taken you will be contacted by phone or by letter within the next 1-3 weeks.  Please call us at [phone] if you have not heard about the biopsies in 3 weeks.    SIGNATURES/CONFIDENTIALITY: You and/or your care partner have signed paperwork which will be entered into your electronic medical record.  These signatures attest to the fact that that the information above on your After Visit Summary has been reviewed and is understood.  Full responsibility of the confidentiality of this discharge information lies with you and/or your care-partner. 

## 2023-01-31 NOTE — Progress Notes (Signed)
Pt resting comfortably. VSS. Airway intact. SBAR complete to RN. All questions answered.   

## 2023-02-01 ENCOUNTER — Telehealth: Payer: Self-pay

## 2023-02-01 NOTE — Telephone Encounter (Signed)
  Follow up Call-     01/31/2023    7:48 AM 11/25/2020    9:17 AM  Call back number  Post procedure Call Back phone  # [phone]  Permission to leave phone message Yes Yes     Patient questions:  Do you have a fever, pain , or abdominal swelling? No. Pain Score  0 *  Have you tolerated food without any problems? Yes.    Have you been able to return to your normal activities? Yes.    Do you have any questions about your discharge instructions: Diet   No. Medications  No. Follow up visit  No.  Do you have questions or concerns about your Care? No.  Actions: * If pain score is 4 or above: No action needed, pain <4.

## 2023-02-06 ENCOUNTER — Ambulatory Visit
Admission: EM | Admit: 2023-02-06 | Discharge: 2023-02-06 | Disposition: A | Discharge status: Home / Self Care | Payer: Medicare Other | Attending: Family Medicine | Admitting: Family Medicine

## 2023-02-06 DIAGNOSIS — J018 Other acute sinusitis: Secondary | ICD-10-CM

## 2023-02-06 MED ORDER — AMOXICILLIN-POT CLAVULANATE 875-125 MG PO TABS
1.0000 | ORAL_TABLET | Freq: Two times a day (BID) | ORAL | 0 refills | Status: DC
Start: 2023-02-06 — End: 2023-10-27

## 2023-02-06 MED ORDER — CETIRIZINE HCL 10 MG PO TABS
10.0000 mg | ORAL_TABLET | Freq: Every day | ORAL | 0 refills | Status: DC
Start: 2023-02-06 — End: 2023-10-27

## 2023-02-06 MED ORDER — PSEUDOEPHEDRINE HCL 30 MG PO TABS
30.0000 mg | ORAL_TABLET | Freq: Three times a day (TID) | ORAL | 0 refills | Status: DC | PRN
Start: 2023-02-06 — End: 2023-10-27

## 2023-02-06 NOTE — Discharge Instructions (Signed)
We will manage this as a sinus infection with Augmentin. For sore throat or cough try using a honey-based tea. Use 3 teaspoons of honey with juice squeezed from half lemon. Place shaved pieces of ginger into 1/2-1 cup of water and warm over stove top. Then mix the ingredients and repeat every 4 hours as needed. Please take Tylenol 500mg -650mg  every 6 hours for throat pain, fevers, aches and pains. Hydrate very well with at least 2 liters of water. Eat light meals such as soups (chicken and noodles, vegetable, chicken and wild rice).  Do not eat foods that you are allergic to.  Taking an antihistamine like Zyrtec can help against postnasal drainage, sinus congestion which can cause sinus pain, sinus headaches, throat pain, painful swallowing, coughing.  You can take this together with pseudoephedrine (Sudafed) at a dose of 30 mg 3 times a day or twice daily as needed for the same kind of nasal drip, congestion.  Use cough medication as needed.

## 2023-02-06 NOTE — ED Provider Notes (Signed)
[name]  Note:  This document was prepared using Conservation officer, historic buildings and may include unintentional dictation errors.  MRN: [id number] DOB: 1951/01/25  Subjective:   Timothy [name] is a 72 y.o. male presenting for 10-day history of acute onset persistent sinus congestion, sinus drainage, malaise, fatigue, mild coughing.  No overt chest pain, shortness of breath or wheezing.  No history of asthma.  No smoking of any kind including cigarettes, cigars, vaping, marijuana use.  Has had 1 sick contact with his wife who was seen yesterday and is getting treatment with antibiotics.  No current facility-administered medications for this encounter.  Current Outpatient Medications:    acetaminophen (TYLENOL) 325 MG tablet, Take 2 tablets (650 mg total) by mouth every 6 (six) hours as needed for mild pain or moderate pain., Disp: , Rfl:    atorvastatin (LIPITOR) 40 MG tablet, Take 1 tablet (40 mg total) by mouth daily., Disp: 90 tablet, Rfl: 3   betamethasone dipropionate (DIPROLENE) 0.05 % ointment, Apply topically 2 (two) times daily., Disp: 50 g, Rfl: 0   hydrOXYzine (ATARAX) 25 MG tablet, Take 0.5 tablets (12.5 mg total) by mouth every 8 (eight) hours as needed for itching. (Patient not taking: Reported on 01/10/2023), Disp: 30 tablet, Rfl: 0   meloxicam (MOBIC) 7.5 MG tablet, Take 1 tablet (7.5 mg total) by mouth daily as needed for pain. (Patient not taking: Reported on 01/10/2023), Disp: 30 tablet, Rfl: 0   metoprolol succinate (TOPROL-XL) 25 MG 24 hr tablet, Take 1 tablet (25 mg total) by mouth daily., Disp: 90 tablet, Rfl: 1   Multiple Vitamin (MULTIVITAMIN) tablet, Take 1 tablet by mouth daily. Unknown strength, Disp: , Rfl:    triamcinolone cream (KENALOG) 0.1 %, Apply 1 Application topically daily as needed (For rash). (Patient not taking: Reported on 01/31/2023), Disp: , Rfl:    No Known Allergies  Past Medical History:  Diagnosis Date    Atypical chest pain 07/13/2019   Bilateral hearing loss 08/24/2019   Cataract    Cataract    Chronic kidney disease    kidney stones   Chronic pain of left knee 11/28/2019   COLONIC POLYPS, HX OF 02/12/2009   Coronary artery disease    COVID-19 01/2020   Dyslipidemia 11/21/2007   Qualifier: Diagnosis of  By: Amador Cunas  MD, Janett Labella    Elevated LFTs 09/02/2018   Essential hypertension 04/07/2016   Treatment initiated February 2018   Follow-up examination after eye surgery 07/19/2019   Healthcare maintenance 08/22/2018   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/09/2017   HYPERLIPIDEMIA 11/21/2007   Hypertension    Hypertriglyceridemia 08/15/2019   Left epiretinal membrane 07/19/2019   Need for influenza vaccination 12/22/2017   Need for shingles vaccine 09/02/2018   NEPHROLITHIASIS, HX OF 01/17/2007   OSTEOARTHRITIS 01/17/2007   Osteoarthritis 01/17/2007   Qualifier: Diagnosis of  By: Amador Cunas  MD, Janett Labella    Posterior vitreous detachment of left eye 07/19/2019   Right epiretinal membrane 07/19/2019   Steatosis of liver 08/24/2019   Subacromial impingement 01/16/2019     Past Surgical History:  Procedure Laterality Date   APPENDECTOMY     CARDIAC CATHETERIZATION     CARPAL TUNNEL RELEASE Right    CATARACT EXTRACTION Bilateral    june 2019   CORONARY ARTERY BYPASS GRAFT N/A 09/30/2021   Procedure: CORONARY ARTERY BYPASS GRAFTING (CABG) X 3 BYPASSES USING LEFT INTERNAL MAMMARY ARTERY AND RIGHT LEG ENDOHARVESTED SAPHENOUS VEIN GRAFT;  Surgeon: Maren Beach,  Theron Arista, MD;  Location: [facility] OR;  Service: Open Heart Surgery;  Laterality: N/A;   HERNIA REPAIR     ingunial   JOINT REPLACEMENT     KNEE SURGERY Right    x 4   LEFT HEART CATH AND CORONARY ANGIOGRAPHY N/A 09/28/2021   Procedure: LEFT HEART CATH AND CORONARY ANGIOGRAPHY;  Surgeon: Runell Gess, MD;  Location: [facility];  Service: Cardiovascular;  Laterality: N/A;   MENISCUS REPAIR Left    knee; x 3    REPLACEMENT TOTAL KNEE Right    SHOULDER SURGERY     left   TEE WITHOUT CARDIOVERSION N/A 09/30/2021   Procedure: TRANSESOPHAGEAL ECHOCARDIOGRAM (TEE);  Surgeon: Lovett Sox, MD;  Location: [facility] OR;  Service: Open Heart Surgery;  Laterality: N/A;   TOTAL KNEE ARTHROPLASTY     right    Family History  Problem Relation Age of Onset   Colon cancer Father 69   Esophageal cancer Neg Hx    Rectal cancer Neg Hx    Stomach cancer Neg Hx    Pancreatic cancer Neg Hx    Liver disease Neg Hx     Social History   Tobacco Use   Smoking status: Never   Smokeless tobacco: Never  Vaping Use   Vaping status: Never Used  Substance Use Topics   Alcohol use: Yes    Alcohol/week: 10.0 standard drinks of alcohol    Types: 10 Cans of beer per week    Comment: 1-2 beers a day   Drug use: Never    ROS   Objective:   Vitals: BP 131/75 (BP Location: Right Arm)   Pulse 66   Temp 98.4 F (36.9 C) (Oral)   Resp 16   SpO2 98%   Physical Exam Constitutional:      General: He is not in acute distress.    Appearance: Normal appearance. He is well-developed and normal weight. He is not ill-appearing, toxic-appearing or diaphoretic.  HENT:     Head: Normocephalic and atraumatic.     Right Ear: Tympanic membrane, ear canal and external ear normal. No drainage, swelling or tenderness. No middle ear effusion. There is no impacted cerumen. Tympanic membrane is not erythematous or bulging.     Left Ear: Tympanic membrane, ear canal and external ear normal. No drainage, swelling or tenderness.  No middle ear effusion. There is no impacted cerumen. Tympanic membrane is not erythematous or bulging.     Nose: Congestion present. No rhinorrhea.     Mouth/Throat:     Mouth: Mucous membranes are moist.     Pharynx: Posterior oropharyngeal erythema present. No oropharyngeal exudate.     Comments: Significant postnasal drainage overlying pharynx. Eyes:     General: No scleral icterus.       Right eye:  No discharge.        Left eye: No discharge.     Extraocular Movements: Extraocular movements intact.     Conjunctiva/sclera: Conjunctivae normal.  Cardiovascular:     Rate and Rhythm: Normal rate and regular rhythm.     Heart sounds: Normal heart sounds. No murmur heard.    No friction rub. No gallop.  Pulmonary:     Effort: Pulmonary effort is normal. No respiratory distress.     Breath sounds: Normal breath sounds. No stridor. No wheezing, rhonchi or rales.  Musculoskeletal:     Cervical back: Normal range of motion and neck supple. No rigidity. No muscular tenderness.  Neurological:  General: No focal deficit present.     Mental Status: He is alert and oriented to person, place, and time.  Psychiatric:        Mood and Affect: Mood normal.        Behavior: Behavior normal.        Thought Content: Thought content normal.     Assessment and Plan :   PDMP not reviewed this encounter.  1. Acute non-recurrent sinusitis of other sinus    Deferred imaging given clear cardiopulmonary exam, hemodynamically stable vital signs.  Will start empiric treatment for sinusitis with Augmentin.  Recommended supportive care otherwise. Counseled patient on potential for adverse effects with medications prescribed/recommended today, ER and return-to-clinic precautions discussed, patient verbalized understanding.    Wallis Bamberg, PA-C 02/06/23 1028

## 2023-02-06 NOTE — ED Triage Notes (Signed)
Pt c/o mild cough,nasal congestion, fatigue x 10 days-NAD-steady gait

## 2023-02-07 ENCOUNTER — Ambulatory Visit: Payer: Self-pay

## 2023-02-09 ENCOUNTER — Telehealth: Payer: Self-pay | Admitting: Family Medicine

## 2023-02-09 NOTE — Telephone Encounter (Signed)
Called patient, he was seen at the [facility] and not here, advised on VM he will need to reach out to them as they saw him and are the ones treating him and we would need an appointment to change his treatment.

## 2023-02-09 NOTE — Telephone Encounter (Signed)
Caller name: Cheenou Dowis -spouse  On DPR?: Yes  Call back number: [phone]  Provider they see: Shade Flood, MD  Reason for call:  Vomiting and diarrhea with ABX will not continue taking what should he do- would you suggest another ABX or advise?

## 2023-02-15 ENCOUNTER — Ambulatory Visit (INDEPENDENT_AMBULATORY_CARE_PROVIDER_SITE_OTHER): Payer: Medicare Other | Admitting: *Deleted

## 2023-02-15 DIAGNOSIS — I2510 Atherosclerotic heart disease of native coronary artery without angina pectoris: Secondary | ICD-10-CM | POA: Insufficient documentation

## 2023-02-15 DIAGNOSIS — Z0000 Encounter for general adult medical examination without abnormal findings: Secondary | ICD-10-CM | POA: Diagnosis not present

## 2023-02-15 DIAGNOSIS — R945 Abnormal results of liver function studies: Secondary | ICD-10-CM | POA: Insufficient documentation

## 2023-02-15 DIAGNOSIS — H903 Sensorineural hearing loss, bilateral: Secondary | ICD-10-CM | POA: Insufficient documentation

## 2023-02-15 DIAGNOSIS — M1990 Unspecified osteoarthritis, unspecified site: Secondary | ICD-10-CM | POA: Insufficient documentation

## 2023-02-15 NOTE — Patient Instructions (Signed)
Mr. Timothy [name] , Thank you for taking time to come for your Medicare Wellness Visit. I appreciate your ongoing commitment to your health goals. Please review the following plan we discussed and let me know if I can assist you in the future.   Screening recommendations/referrals: Colonoscopy: up to date Recommended yearly ophthalmology/optometry visit for glaucoma screening and checkup Recommended yearly dental visit for hygiene and checkup  Vaccinations: Influenza vaccine:  Pneumococcal vaccine: up to date Tdap vaccine: Education provided Shingles vaccine: up to date    Advanced directives: yes on file     Preventive Care 65 Years and Older, Male Preventive care refers to lifestyle choices and visits with your health care provider that can promote health and wellness. What does preventive care include? A yearly physical exam. This is also called an annual well check. Dental exams once or twice a year. Routine eye exams. Ask your health care provider how often you should have your eyes checked. Personal lifestyle choices, including: Daily care of your teeth and gums. Regular physical activity. Eating a healthy diet. Avoiding tobacco and drug use. Limiting alcohol use. Practicing safe sex. Taking low doses of aspirin every day. Taking vitamin and mineral supplements as recommended by your health care provider. What happens during an annual well check? The services and screenings done by your health care provider during your annual well check will depend on your age, overall health, lifestyle risk factors, and family history of disease. Counseling  Your health care provider may ask you questions about your: Alcohol use. Tobacco use. Drug use. Emotional well-being. Home and relationship well-being. Sexual activity. Eating habits. History of falls. Memory and ability to understand (cognition). Work and work Astronomer. Screening  You may have the following tests or  measurements: Height, weight, and BMI. Blood pressure. Lipid and cholesterol levels. These may be checked every 5 years, or more frequently if you are over 31 years old. Skin check. Lung cancer screening. You may have this screening every year starting at age 2 if you have a 30-pack-year history of smoking and currently smoke or have quit within the past 15 years. Fecal occult blood test (FOBT) of the stool. You may have this test every year starting at age 48. Flexible sigmoidoscopy or colonoscopy. You may have a sigmoidoscopy every 5 years or a colonoscopy every 10 years starting at age 25. Prostate cancer screening. Recommendations will vary depending on your family history and other risks. Hepatitis C blood test. Hepatitis B blood test. Sexually transmitted disease (STD) testing. Diabetes screening. This is done by checking your blood sugar (glucose) after you have not eaten for a while (fasting). You may have this done every 1-3 years. Abdominal aortic aneurysm (AAA) screening. You may need this if you are a current or former smoker. Osteoporosis. You may be screened starting at age 3 if you are at high risk. Talk with your health care provider about your test results, treatment options, and if necessary, the need for more tests. Vaccines  Your health care provider may recommend certain vaccines, such as: Influenza vaccine. This is recommended every year. Tetanus, diphtheria, and acellular pertussis (Tdap, Td) vaccine. You may need a Td booster every 10 years. Zoster vaccine. You may need this after age 81. Pneumococcal 13-valent conjugate (PCV13) vaccine. One dose is recommended after age 4. Pneumococcal polysaccharide (PPSV23) vaccine. One dose is recommended after age 33. Talk to your health care provider about which screenings and vaccines you need and how often you need them. This  information is not intended to replace advice given to you by your health care provider. Make sure  you discuss any questions you have with your health care provider. Document Released: 03/14/2015 Document Revised: 11/05/2015 Document Reviewed: 12/17/2014 Elsevier Interactive Patient Education  2017 [employer].  Fall Prevention in the Home Falls can cause injuries. They can happen to people of all ages. There are many things you can do to make your home safe and to help prevent falls. What can I do on the outside of my home? Regularly fix the edges of walkways and driveways and fix any cracks. Remove anything that might make you trip as you walk through a door, such as a raised step or threshold. Trim any bushes or trees on the path to your home. Use bright outdoor lighting. Clear any walking paths of anything that might make someone trip, such as rocks or tools. Regularly check to see if handrails are loose or broken. Make sure that both sides of any steps have handrails. Any raised decks and porches should have guardrails on the edges. Have any leaves, snow, or ice cleared regularly. Use sand or salt on walking paths during winter. Clean up any spills in your garage right away. This includes oil or grease spills. What can I do in the bathroom? Use night lights. Install grab bars by the toilet and in the tub and shower. Do not use towel bars as grab bars. Use non-skid mats or decals in the tub or shower. If you need to sit down in the shower, use a plastic, non-slip stool. Keep the floor dry. Clean up any water that spills on the floor as soon as it happens. Remove soap buildup in the tub or shower regularly. Attach bath mats securely with double-sided non-slip rug tape. Do not have throw rugs and other things on the floor that can make you trip. What can I do in the bedroom? Use night lights. Make sure that you have a light by your bed that is easy to reach. Do not use any sheets or blankets that are too big for your bed. They should not hang down onto the floor. Have a firm  chair that has side arms. You can use this for support while you get dressed. Do not have throw rugs and other things on the floor that can make you trip. What can I do in the kitchen? Clean up any spills right away. Avoid walking on wet floors. Keep items that you use a lot in easy-to-reach places. If you need to reach something above you, use a strong step stool that has a grab bar. Keep electrical cords out of the way. Do not use floor polish or wax that makes floors slippery. If you must use wax, use non-skid floor wax. Do not have throw rugs and other things on the floor that can make you trip. What can I do with my stairs? Do not leave any items on the stairs. Make sure that there are handrails on both sides of the stairs and use them. Fix handrails that are broken or loose. Make sure that handrails are as long as the stairways. Check any carpeting to make sure that it is firmly attached to the stairs. Fix any carpet that is loose or worn. Avoid having throw rugs at the top or bottom of the stairs. If you do have throw rugs, attach them to the floor with carpet tape. Make sure that you have a light switch at the top  of the stairs and the bottom of the stairs. If you do not have them, ask someone to add them for you. What else can I do to help prevent falls? Wear shoes that: Do not have high heels. Have rubber bottoms. Are comfortable and fit you well. Are closed at the toe. Do not wear sandals. If you use a stepladder: Make sure that it is fully opened. Do not climb a closed stepladder. Make sure that both sides of the stepladder are locked into place. Ask someone to hold it for you, if possible. Clearly mark and make sure that you can see: Any grab bars or handrails. First and last steps. Where the edge of each step is. Use tools that help you move around (mobility aids) if they are needed. These include: Canes. Walkers. Scooters. Crutches. Turn on the lights when you go  into a dark area. Replace any light bulbs as soon as they burn out. Set up your furniture so you have a clear path. Avoid moving your furniture around. If any of your floors are uneven, fix them. If there are any pets around you, be aware of where they are. Review your medicines with your doctor. Some medicines can make you feel dizzy. This can increase your chance of falling. Ask your doctor what other things that you can do to help prevent falls. This information is not intended to replace advice given to you by your health care provider. Make sure you discuss any questions you have with your health care provider. Document Released: 12/12/2008 Document Revised: 07/24/2015 Document Reviewed: 03/22/2014 Elsevier Interactive Patient Education  2017 [employer].

## 2023-02-15 NOTE — Progress Notes (Signed)
Subjective:   Timothy [name] is a 72 y.o. male who presents for Medicare Annual/Subsequent preventive examination.  Visit Complete: Virtual I connected with  [name] on 02/15/23 by a audio enabled telemedicine application and verified that I am speaking with the correct person using two identifiers.  Patient Location: Home  Provider Location: Home Office  I discussed the limitations of evaluation and management by telemedicine. The patient expressed understanding and agreed to proceed.  Vital Signs: Because this visit was a virtual/telehealth visit, some criteria may be missing or patient reported. Any vitals not documented were not able to be obtained and vitals that have been documented are patient reported.   Cardiac Risk Factors include: advanced age (>20men, >50 women);male gender;hypertension     Objective:    There were no vitals filed for this visit. There is no height or weight on file to calculate BMI.     02/15/2023    8:54 AM 02/10/2022    8:32 AM 09/29/2021   12:35 PM 09/27/2021   10:03 AM 03/01/2021   10:23 AM 01/27/2021    2:29 PM 01/22/2020    2:21 PM  Advanced Directives  Does Patient Have a Medical Advance Directive? Yes Yes  No No Yes Yes  Type of Estate agent of [place];Living will Healthcare Power of [employer] [place];Living will Healthcare Power of [place];Living will  Copy of Healthcare Power of Attorney in Chart? Yes - validated most recent copy scanned in chart (See row information) Yes - validated most recent copy scanned in chart (See row information)    No - copy requested No - copy requested  Would patient like information on creating a medical advance directive?   No - Patient declined        Current Medications (verified) Outpatient Encounter Medications as of 02/15/2023  Medication Sig   acetaminophen (TYLENOL) 325 MG tablet Take 2 tablets (650 mg total) by mouth every 6 (six)  hours as needed for mild pain or moderate pain.   atorvastatin (LIPITOR) 40 MG tablet Take 1 tablet (40 mg total) by mouth daily.   cetirizine (ZYRTEC ALLERGY) 10 MG tablet Take 1 tablet (10 mg total) by mouth daily.   metoprolol succinate (TOPROL-XL) 25 MG 24 hr tablet Take 1 tablet (25 mg total) by mouth daily.   Multiple Vitamin (MULTIVITAMIN) tablet Take 1 tablet by mouth daily. Unknown strength   amoxicillin-clavulanate (AUGMENTIN) 875-125 MG tablet Take 1 tablet by mouth 2 (two) times daily. (Patient not taking: Reported on 02/15/2023)   betamethasone dipropionate (DIPROLENE) 0.05 % ointment Apply topically 2 (two) times daily.   hydrOXYzine (ATARAX) 25 MG tablet Take 0.5 tablets (12.5 mg total) by mouth every 8 (eight) hours as needed for itching. (Patient not taking: Reported on 02/15/2023)   meloxicam (MOBIC) 7.5 MG tablet Take 1 tablet (7.5 mg total) by mouth daily as needed for pain. (Patient not taking: Reported on 02/15/2023)   pseudoephedrine (SUDAFED) 30 MG tablet Take 1 tablet (30 mg total) by mouth every 8 (eight) hours as needed for congestion. (Patient not taking: Reported on 02/15/2023)   triamcinolone cream (KENALOG) 0.1 % Apply 1 Application topically daily as needed (For rash). (Patient not taking: Reported on 01/31/2023)   No facility-administered encounter medications on file as of 02/15/2023.    Allergies (verified) Patient has no known allergies.   History: Past Medical History:  Diagnosis Date   Atypical chest pain 07/13/2019   Bilateral hearing loss  08/24/2019   Cataract    Cataract    Chronic kidney disease    kidney stones   Chronic pain of left knee 11/28/2019   COLONIC POLYPS, HX OF 02/12/2009   Coronary artery disease    COVID-19 01/2020   Dyslipidemia 11/21/2007   Qualifier: Diagnosis of  By: Amador Cunas  MD, Janett Labella    Elevated LFTs 09/02/2018   Essential hypertension 04/07/2016   Treatment initiated February 2018   Follow-up examination after  eye surgery 07/19/2019   Healthcare maintenance 08/22/2018   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/09/2017   HYPERLIPIDEMIA 11/21/2007   Hypertension    Hypertriglyceridemia 08/15/2019   Left epiretinal membrane 07/19/2019   Need for influenza vaccination 12/22/2017   Need for shingles vaccine 09/02/2018   NEPHROLITHIASIS, HX OF 01/17/2007   OSTEOARTHRITIS 01/17/2007   Osteoarthritis 01/17/2007   Qualifier: Diagnosis of  By: Amador Cunas  MD, Janett Labella    Posterior vitreous detachment of left eye 07/19/2019   Right epiretinal membrane 07/19/2019   Steatosis of liver 08/24/2019   Subacromial impingement 01/16/2019   Past Surgical History:  Procedure Laterality Date   APPENDECTOMY     CARDIAC CATHETERIZATION     CARPAL TUNNEL RELEASE Right    CATARACT EXTRACTION Bilateral    june 2019   CORONARY ARTERY BYPASS GRAFT N/A 09/30/2021   Procedure: CORONARY ARTERY BYPASS GRAFTING (CABG) X 3 BYPASSES USING LEFT INTERNAL MAMMARY ARTERY AND RIGHT LEG ENDOHARVESTED SAPHENOUS VEIN GRAFT;  Surgeon: Lovett Sox, MD;  Location: [facility] OR;  Service: Open Heart Surgery;  Laterality: N/A;   HERNIA REPAIR     ingunial   JOINT REPLACEMENT     KNEE SURGERY Right    x 4   LEFT HEART CATH AND CORONARY ANGIOGRAPHY N/A 09/28/2021   Procedure: LEFT HEART CATH AND CORONARY ANGIOGRAPHY;  Surgeon: Runell Gess, MD;  Location: [facility] INVASIVE CV LAB;  Service: Cardiovascular;  Laterality: N/A;   MENISCUS REPAIR Left    knee; x 3   REPLACEMENT TOTAL KNEE Right    SHOULDER SURGERY     left   TEE WITHOUT CARDIOVERSION N/A 09/30/2021   Procedure: TRANSESOPHAGEAL ECHOCARDIOGRAM (TEE);  Surgeon: Lovett Sox, MD;  Location: [facility];  Service: Open Heart Surgery;  Laterality: N/A;   TOTAL KNEE ARTHROPLASTY     right   Family History  Problem Relation Age of Onset   Colon cancer Father 92   Esophageal cancer Neg Hx    Rectal cancer Neg Hx    Stomach cancer Neg Hx    Pancreatic cancer Neg Hx    Liver  disease Neg Hx    Social History   Socioeconomic History   Marital status: Married    Spouse name: Not on file   Number of children: 2   Years of education: 16   Highest education level: Bachelor's degree (e.g., BA, AB, BS)  Occupational History   Not on file  Tobacco Use   Smoking status: Never   Smokeless tobacco: Never  Vaping Use   Vaping status: Never Used  Substance and Sexual Activity   Alcohol use: Yes    Alcohol/week: 10.0 standard drinks of alcohol    Types: 10 Cans of beer per week    Comment: 1-2 beers a day   Drug use: Never   Sexual activity: Not Currently  Other Topics Concern   Not on file  Social History Narrative   ** Merged History Encounter **       Social Drivers  of Health   Financial Resource Strain: Low Risk  (02/15/2023)   Overall Financial Resource Strain (CARDIA)    Difficulty of Paying Living Expenses: Not hard at all  Food Insecurity: No Food Insecurity (02/15/2023)   Hunger Vital Sign    Worried About Running Out of Food in the Last Year: Never true    Ran Out of Food in the Last Year: Never true  Transportation Needs: No Transportation Needs (02/15/2023)   PRAPARE - Administrator, Civil Service (Medical): No    Lack of Transportation (Non-Medical): No  Physical Activity: Sufficiently Active (02/15/2023)   Exercise Vital Sign    Days of Exercise per Week: 4 days    Minutes of Exercise per Session: 60 min  Stress: No Stress Concern Present (02/15/2023)   [employer] of Occupational Health - Occupational Stress Questionnaire    Feeling of Stress : Not at all  Social Connections: Socially Integrated (02/15/2023)   Social Connection and Isolation Panel [NHANES]    Frequency of Communication with Friends and Family: More than three times a week    Frequency of Social Gatherings with Friends and Family: More than three times a week    Attends Religious Services: More than 4 times per year    Active Member of [employer] or  Organizations: Yes    Attends Engineer, structural: More than 4 times per year    Marital Status: Married    Tobacco Counseling Counseling given: Not Answered   Clinical Intake:  Pre-visit preparation completed: Yes  Pain : No/denies pain     Diabetes: No  How often do you need to have someone help you when you read instructions, pamphlets, or other written materials from your doctor or pharmacy?: 1 - Never  Interpreter Needed?: No  Information entered by :: Remi Haggard LPN   Activities of Daily Living    02/15/2023    8:57 AM  In your present state of health, do you have any difficulty performing the following activities:  Hearing? 1  Vision? 0  Difficulty concentrating or making decisions? 0  Walking or climbing stairs? 0  Dressing or bathing? 0  Doing errands, shopping? 0  Preparing Food and eating ? N  Using the Toilet? N  In the past six months, have you accidently leaked urine? N  Do you have problems with loss of bowel control? N  Managing your Medications? N  Managing your Finances? N  Housekeeping or managing your Housekeeping? N    Patient Care Team: Shade Flood, MD as PCP - General (Family Medicine)  Indicate any recent Medical Services you may have received from other than [facility] providers in the past year (date may be approximate).     Assessment:   This is a routine wellness examination for Timothy [name].  Hearing/Vision screen Hearing Screening - Comments:: [facility]ilateral [facility] aids [facility]  35 percent in one ear Vision Screening - Comments:: [facility] Up to date   Goals Addressed             This Visit's Progress    Maintain healthy active lifestyle.   On track    Patient Stated       Doreatha Martin projects  Maintain healthy lifestyle       Depression Screen    02/15/2023    8:59 AM 07/28/2022    8:17 AM 02/12/2022   11:01 AM 02/10/2022    8:38 AM 01/14/2022    8:29 AM 12/17/2021    8:50 AM  06/24/2021    7:51 AM  PHQ 2/9 Scores   PHQ - 2 Score 0 0 0 0 0 0 0  PHQ- 9 Score 0 0  0 0  0    Fall Risk    02/15/2023    8:54 AM 07/28/2022    8:17 AM 02/10/2022    8:32 AM 01/14/2022    8:30 AM 12/17/2021    9:30 AM  Fall Risk   Falls in the past year? 0 0 0 0 0  Number falls in past yr: 0  0 0 0  Injury with Fall? 0  0 0 0  Risk for fall due to :  No Fall Risks  No Fall Risks   Follow up Falls evaluation completed;Education provided;Falls prevention discussed Falls evaluation completed Falls evaluation completed;Falls prevention discussed;Education provided Falls evaluation completed Falls evaluation completed    MEDICARE RISK AT HOME: Medicare Risk at Home Any stairs in or around the home?: Yes If so, are there any without handrails?: No Home free of loose throw rugs in walkways, pet beds, electrical cords, etc?: Yes Adequate lighting in your home to reduce risk of falls?: Yes Life alert?: No Use of a cane, walker or w/c?: No Grab bars in the bathroom?: Yes Shower chair or bench in shower?: Yes Elevated toilet seat or a handicapped toilet?: Yes  TIMED UP AND GO:  Was the test performed?  No    Cognitive Function:        02/15/2023    8:56 AM 02/10/2022    8:35 AM  6CIT Screen  What Year? 0 points 0 points  What month? 0 points 0 points  What time? 0 points 0 points  Count back from 20 0 points 0 points  Months in reverse 0 points 0 points  Repeat phrase 0 points 2 points  Total Score 0 points 2 points    Immunizations Immunization History  Administered Date(s) Administered   Fluad Quad(high Dose 65+) 10/24/2018, 11/28/2019   Influenza Split 01/04/2011   Influenza Whole 01/19/2007, 11/21/2007, 12/24/2008, 02/13/2010   Influenza, High Dose Seasonal PF 01/07/2015, 01/30/2016, 12/17/2016, 12/22/2017, 01/05/2022   Influenza,inj,Quad PF,6+ Mos 11/14/2012, 01/02/2014   Moderna Sars-Covid-2 Vaccination 04/11/2019, 05/11/2019   Pneumococcal Conjugate-13 05/20/2015   Pneumococcal Polysaccharide-23  12/17/2016   Tdap 11/14/2012   Zoster Recombinant(Shingrix) 08/22/2018, 10/24/2018   Zoster, Live 06/08/2010    Tdap patient will check with [facility]  Flu Vaccine status: Due, Education has been provided regarding the importance of this vaccine. Advised may receive this vaccine at local pharmacy or Health Dept. Aware to provide a copy of the vaccination record if obtained from local pharmacy or Health Dept. Verbalized acceptance and understanding.  Pneumococcal vaccine status: Up to date  Covid-19 vaccine status: Declined, Education has been provided regarding the importance of this vaccine but patient still declined. Advised may receive this vaccine at local pharmacy or Health Dept.or vaccine clinic. Aware to provide a copy of the vaccination record if obtained from local pharmacy or Health Dept. Verbalized acceptance and understanding.  Qualifies for Shingles Vaccine? No   Zostavax completed No   Shingrix Completed?: Yes  Screening Tests Health Maintenance  Topic Date Due   INFLUENZA VACCINE  05/30/2023 (Originally 09/30/2022)   DTaP/Tdap/Td (2 - Td or Tdap) 02/15/2024 (Originally 11/15/2022)   Medicare Annual Wellness (AWV)  02/15/2024   Colonoscopy  01/31/2028   Pneumonia Vaccine 4+ Years old  Completed   Hepatitis C Screening  Completed   Zoster Vaccines- Shingrix  Completed   HPV VACCINES  Aged Out   COVID-19 Vaccine  Discontinued    Health Maintenance  There are no preventive care reminders to display for this patient.   Colorectal cancer screening: Type of screening: Colonoscopy. Completed 2024. Repeat every 5 years  Lung Cancer Screening: (Low Dose CT Chest recommended if Age 43-80 years, 20 pack-year currently smoking OR have quit w/in 15years.) does not qualify.   Lung Cancer Screening Referral:   Additional Screening:  Hepatitis C Screening: does not qualify; Completed 2017  Vision Screening: Recommended annual ophthalmology exams for early detection of glaucoma and  other disorders of the eye. Is the patient up to date with their annual eye exam?  Yes  Who is the provider or what is the name of the office in which the patient attends annual eye exams? [facility] If pt is not established with a provider, would they like to be referred to a provider to establish care? No .   Dental Screening: Recommended annual dental exams for proper oral hygiene    Community Resource Referral / Chronic Care Management: CRR required this visit?  No   CCM required this visit?  No     Plan:     I have personally reviewed and noted the following in the patient's chart:   Medical and social history Use of alcohol, tobacco or illicit drugs  Current medications and supplements including opioid prescriptions. Patient is not currently taking opioid prescriptions. Functional ability and status Nutritional status Physical activity Advanced directives List of other physicians Hospitalizations, surgeries, and ER visits in previous 12 months Vitals Screenings to include cognitive, depression, and falls Referrals and appointments  In addition, I have reviewed and discussed with patient certain preventive protocols, quality metrics, and best practice recommendations. A written personalized care plan for preventive services as well as general preventive health recommendations were provided to patient.     Remi Haggard, LPN   16/11/9602   After Visit Summary: (MyChart) Due to this being a telephonic visit, the after visit summary with patients personalized plan was offered to patient via MyChart   Nurse Notes:

## 2023-02-25 NOTE — Telephone Encounter (Signed)
error 

## 2023-03-18 ENCOUNTER — Encounter: Payer: Self-pay | Admitting: Cardiology

## 2023-03-18 ENCOUNTER — Ambulatory Visit: Payer: Medicare Other | Attending: Cardiology | Admitting: Cardiology

## 2023-03-18 VITALS — BP 138/68 | HR 75 | Ht 70.0 in | Wt 156.0 lb

## 2023-03-18 DIAGNOSIS — I10 Essential (primary) hypertension: Secondary | ICD-10-CM | POA: Insufficient documentation

## 2023-03-18 DIAGNOSIS — E782 Mixed hyperlipidemia: Secondary | ICD-10-CM | POA: Insufficient documentation

## 2023-03-18 DIAGNOSIS — Z951 Presence of aortocoronary bypass graft: Secondary | ICD-10-CM | POA: Insufficient documentation

## 2023-03-18 NOTE — Addendum Note (Signed)
Addended by: Lonia Farber on: 03/18/2023 10:59 AM   Modules accepted: Orders

## 2023-03-18 NOTE — Patient Instructions (Addendum)
Medication Instructions:  Your physician recommends that you continue on your current medications as directed. Please refer to the Current Medication list given to you today.  *If you need a refill on your cardiac medications before your next appointment, please call your pharmacy*   Lab Work: Your physician recommends that you return for lab work in: next week  You need to have labs done when you are fasting.  You can come Monday through Friday 8:30 am to 11:30 am and 1:00 to 4:00. You do not need to make an appointment as the order has already been placed. The labs you are going to have done are Lipid panel, AST, and ALT  If you have labs (blood work) drawn today and your tests are completely normal, you will receive your results only by: MyChart Message (if you have MyChart) OR A paper copy in the mail If you have any lab test that is abnormal or we need to change your treatment, we will call you to review the results.   Testing/Procedures: None Ordered   Follow-Up: At [facility], you and your health needs are our priority.  As part of our continuing mission to provide you with exceptional heart care, we have created designated Provider Care Teams.  These Care Teams include your primary Cardiologist (physician) and Advanced Practice Providers (APPs -  Physician Assistants and Nurse Practitioners) who all work together to provide you with the care you need, when you need it.  We recommend signing up for the patient portal called "MyChart".  Sign up information is provided on this After Visit Summary.  MyChart is used to connect with patients for Virtual Visits (Telemedicine).  Patients are able to view lab/test results, encounter notes, upcoming appointments, etc.  Non-urgent messages can be sent to your provider as well.   To learn more about what you can do with MyChart, go to [email or web address].    Your next appointment:   6 month follow up

## 2023-03-18 NOTE — Progress Notes (Signed)
Cardiology Office Note:    Date:  03/18/2023   ID:  Timothy [name], DOB [birth year], MRN [id number]  PCP:  Shade Flood, MD  Cardiologist:  Gypsy Balsam, MD    Referring MD: Shade Flood, MD   Chief Complaint  Patient presents with   Follow-up    History of Present Illness:    Timothy [name] is a 73 y.o. male with past medical history significant for coronary disease.  In August 2023 he required coronary bypass graft with LIMA to LAD SVG to circumflex SVG to posterior descending artery in face of non-STEMI.  Likely left ventricular ejection fraction was preserved.  Additional problem include essential hypertension, dyslipidemia, also liver function test abnormality date/s statin. Comes today to months for follow-up he is doing great he goes to gym 5 times a week exercise aggressively have no difficulty doing it.  No chest pain tightness squeezing pressure burning chest  Past Medical History:  Diagnosis Date   Atypical chest pain 07/13/2019   Bilateral hearing loss 08/24/2019   Cataract    Cataract    Chronic kidney disease    kidney stones   Chronic pain of left knee 11/28/2019   COLONIC POLYPS, HX OF 02/12/2009   Coronary artery disease    COVID-19 01/2020   Dyslipidemia 11/21/2007   Qualifier: Diagnosis of  By: Amador Cunas  MD, Janett Labella    Elevated LFTs 09/02/2018   Essential hypertension 04/07/2016   Treatment initiated February 2018   Follow-up examination after eye surgery 07/19/2019   Healthcare maintenance 08/22/2018   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/09/2017   HYPERLIPIDEMIA 11/21/2007   Hypertension    Hypertriglyceridemia 08/15/2019   Left epiretinal membrane 07/19/2019   Need for influenza vaccination 12/22/2017   Need for shingles vaccine 09/02/2018   NEPHROLITHIASIS, HX OF 01/17/2007   OSTEOARTHRITIS 01/17/2007   Osteoarthritis 01/17/2007   Qualifier: Diagnosis of  By: Amador Cunas  MD, Janett Labella    Posterior vitreous detachment  of left eye 07/19/2019   Right epiretinal membrane 07/19/2019   Steatosis of liver 08/24/2019   Subacromial impingement 01/16/2019    Past Surgical History:  Procedure Laterality Date   APPENDECTOMY     CARDIAC CATHETERIZATION     CARPAL TUNNEL RELEASE Right    CATARACT EXTRACTION Bilateral    june 2019   CORONARY ARTERY BYPASS GRAFT N/A 09/30/2021   Procedure: CORONARY ARTERY BYPASS GRAFTING (CABG) X 3 BYPASSES USING LEFT INTERNAL MAMMARY ARTERY AND RIGHT LEG ENDOHARVESTED SAPHENOUS VEIN GRAFT;  Surgeon: Lovett Sox, MD;  Location: [facility] OR;  Service: Open Heart Surgery;  Laterality: N/A;   HERNIA REPAIR     ingunial   JOINT REPLACEMENT     KNEE SURGERY Right    x 4   LEFT HEART CATH AND CORONARY ANGIOGRAPHY N/A 09/28/2021   Procedure: LEFT HEART CATH AND CORONARY ANGIOGRAPHY;  Surgeon: Runell Gess, MD;  Location: [facility] INVASIVE CV LAB;  Service: Cardiovascular;  Laterality: N/A;   MENISCUS REPAIR Left    knee; x 3   REPLACEMENT TOTAL KNEE Right    SHOULDER SURGERY     left   TEE WITHOUT CARDIOVERSION N/A 09/30/2021   Procedure: TRANSESOPHAGEAL ECHOCARDIOGRAM (TEE);  Surgeon: Lovett Sox, MD;  Location[facility];  Service: Open Heart Surgery;  Laterality: N/A;   TOTAL KNEE ARTHROPLASTY     right    Current Medications: Current Meds  Medication Sig   acetaminophen (TYLENOL) 325 MG tablet Take 2 tablets (650 mg total)  by mouth every 6 (six) hours as needed for mild pain or moderate pain.   amoxicillin-clavulanate (AUGMENTIN) 875-125 MG tablet Take 1 tablet by mouth 2 (two) times daily.   atorvastatin (LIPITOR) 40 MG tablet Take 1 tablet (40 mg total) by mouth daily.   betamethasone dipropionate (DIPROLENE) 0.05 % ointment Apply topically 2 (two) times daily. (Patient taking differently: Apply 1 Application topically 2 (two) times daily.)   cetirizine (ZYRTEC ALLERGY) 10 MG tablet Take 1 tablet (10 mg total) by mouth daily.   hydrOXYzine (ATARAX) 25 MG tablet Take 0.5  tablets (12.5 mg total) by mouth every 8 (eight) hours as needed for itching.   meloxicam (MOBIC) 7.5 MG tablet Take 1 tablet (7.5 mg total) by mouth daily as needed for pain.   metoprolol succinate (TOPROL-XL) 25 MG 24 hr tablet Take 1 tablet (25 mg total) by mouth daily.   Multiple Vitamin (MULTIVITAMIN) tablet Take 1 tablet by mouth daily. Unknown strength   pseudoephedrine (SUDAFED) 30 MG tablet Take 1 tablet (30 mg total) by mouth every 8 (eight) hours as needed for congestion.   triamcinolone cream (KENALOG) 0.1 % Apply 1 Application topically daily as needed (For rash).     Allergies:   Patient has no known allergies.   Social History   Socioeconomic History   Marital status: Married    Spouse name: Not on file   Number of children: 2   Years of education: 16   Highest education level: Bachelor's degree (e.g., BA, AB, BS)  Occupational History   Not on file  Tobacco Use   Smoking status: Never   Smokeless tobacco: Never  Vaping Use   Vaping status: Never Used  Substance and Sexual Activity   Alcohol use: Yes    Alcohol/week: 10.0 standard drinks of alcohol    Types: 10 Cans of beer per week    Comment: 1-2 beers a day   Drug use: Never   Sexual activity: Not Currently  Other Topics Concern   Not on file  Social History Narrative   ** Merged History Encounter **       Social Drivers of Health   Financial Resource Strain: Low Risk  (02/15/2023)   Overall Financial Resource Strain (CARDIA)    Difficulty of Paying Living Expenses: Not hard at all  Food Insecurity: No Food Insecurity (02/15/2023)   Hunger Vital Sign    Worried About Running Out of Food in the Last Year: Never true    Ran Out of Food in the Last Year: Never true  Transportation Needs: No Transportation Needs (02/15/2023)   PRAPARE - Administrator, Civil Service (Medical): No    Lack of Transportation (Non-Medical): No  Physical Activity: Sufficiently Active (02/15/2023)   Exercise  Vital Sign    Days of Exercise per Week: 4 days    Minutes of Exercise per Session: 60 min  Stress: No Stress Concern Present (02/15/2023)   [employer] of Occupational Health - Occupational Stress Questionnaire    Feeling of Stress : Not at all  Social Connections: Socially Integrated (02/15/2023)   Social Connection and Isolation Panel [NHANES]    Frequency of Communication with Friends and Family: More than three times a week    Frequency of Social Gatherings with Friends and Family: More than three times a week    Attends Religious Services: More than 4 times per year    Active Member of [employer] or Organizations: Yes    Attends Banker  Meetings: More than 4 times per year    Marital Status: Married     Family History: The patient's family history includes Colon cancer (age of onset: 3) in his father. There is no history of Esophageal cancer, Rectal cancer, Stomach cancer, Pancreatic cancer, or Liver disease. ROS:   Please see the history of present illness.    All 14 point review of systems negative except as described per history of present illness  EKGs/Labs/Other Studies Reviewed:    EKG Interpretation Date/Time:  Friday March 18 2023 10:13:42 EST Ventricular Rate:  71 PR Interval:  162 QRS Duration:  90 QT Interval:  398 QTC Calculation: 432 R Axis:   52  Text Interpretation: Normal sinus rhythm Normal ECG When compared with ECG of 22-Jan-2008 15:34, QT has lengthened Confirmed by Gypsy Balsam [phone]) on 03/18/2023 10:43:49 AM    Recent Labs: 07/28/2022: ALT 100; ALT 100; BUN 13; Creatinine, Ser 0.99; Potassium 3.6; Sodium 136  Recent Lipid Panel    Component Value Date/Time   CHOL 110 07/28/2022 0833   CHOL 212 (H) 03/16/2021 0903   TRIG 379.0 (H) 07/28/2022 0833   HDL 24.40 (L) 07/28/2022 0833   HDL 64 03/16/2021 0903   CHOLHDL 4 07/28/2022 0833   VLDL 75.8 (H) 07/28/2022 0833   LDLCALC 80 09/28/2021 0235   LDLCALC 124 (H) 03/16/2021  0903   LDLCALC 61 08/22/2018 1109   LDLDIRECT 25.0 07/28/2022 0833    Physical Exam:    VS:  BP 138/68 (BP Location: Right Arm, Patient Position: Sitting)   Pulse 75   Ht 5\' 10"  (1.778 m)   Wt 156 lb (70.8 kg)   SpO2 98%   BMI 22.38 kg/m     Wt Readings from Last 3 Encounters:  03/18/23 156 lb (70.8 kg)  01/31/23 165 lb (74.8 kg)  01/10/23 165 lb (74.8 kg)     GEN:  Well nourished, well developed in no acute distress HEENT: Normal NECK: No JVD; No carotid bruits LYMPHATICS: No lymphadenopathy CARDIAC: RRR, no murmurs, no rubs, no gallops RESPIRATORY:  Clear to auscultation without rales, wheezing or rhonchi  ABDOMEN: Soft, non-tender, non-distended MUSCULOSKELETAL:  No edema; No deformity  SKIN: Warm and dry LOWER EXTREMITIES: no swelling NEUROLOGIC:  Alert and oriented x 3 PSYCHIATRIC:  Normal affect   ASSESSMENT:    1. Essential hypertension   2. S/P CABG x 3   3. Mixed hyperlipidemia    PLAN:    In order of problems listed above:  Coronary disease status post coronary bypass graft done in 2023 doing well from that point review denies have any symptoms that would suggest reactivation of the problem. Essential hypertension blood pressure well-controlled continue present management. Dyslipidemia will make management for fasting lipid profile to be done.  We need to be careful with his liver function test abnormality which will be checked as well   Medication Adjustments/Labs and Tests Ordered: Current medicines are reviewed at length with the patient today.  Concerns regarding medicines are outlined above.  Orders Placed This Encounter  Procedures   EKG 12-Lead   Medication changes: No orders of the defined types were placed in this encounter.   Signed, Georgeanna Lea, MD, [facility] 03/18/2023 10:54 AM    [facility]

## 2023-05-04 DIAGNOSIS — L309 Dermatitis, unspecified: Secondary | ICD-10-CM | POA: Diagnosis not present

## 2023-05-04 DIAGNOSIS — D1801 Hemangioma of skin and subcutaneous tissue: Secondary | ICD-10-CM | POA: Diagnosis not present

## 2023-05-04 DIAGNOSIS — L812 Freckles: Secondary | ICD-10-CM | POA: Diagnosis not present

## 2023-05-04 DIAGNOSIS — L821 Other seborrheic keratosis: Secondary | ICD-10-CM | POA: Diagnosis not present

## 2023-05-04 DIAGNOSIS — L570 Actinic keratosis: Secondary | ICD-10-CM | POA: Diagnosis not present

## 2023-05-06 IMAGING — CT CT ABD-PELV W/ CM
3 of 4 series · 11 of 46 positions shown, 18 images · IV contrast (OMNIPAQUE)
Comparison: None.

CLINICAL DATA: Chronic abdominal pain and nausea. Unintentional
weight loss of 15 lb in past year.

EXAM:
CT ABDOMEN AND PELVIS WITH CONTRAST
TECHNIQUE: Multidetector CT imaging of the abdomen and pelvis was performed
using the standard protocol following bolus administration of
intravenous contrast.
CONTRAST:  75mL OMNIPAQUE IOHEXOL 350 MG/ML SOLN

[Series 4: coronal st · coronal · 0.78mm/px · 3 of 80 slices shown, 4 images]
[im 27/80  soft-tissue]
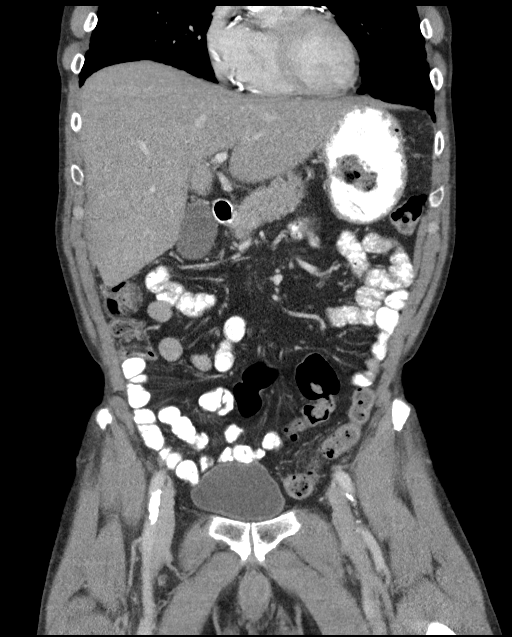
[im 36/80  soft-tissue]
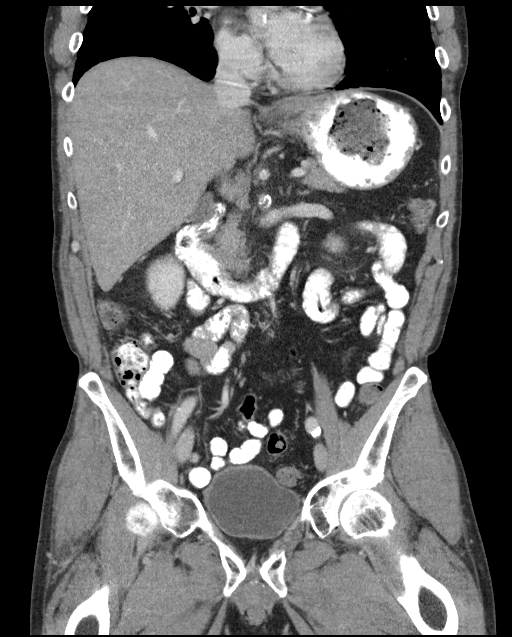
[im 36/80  bone]
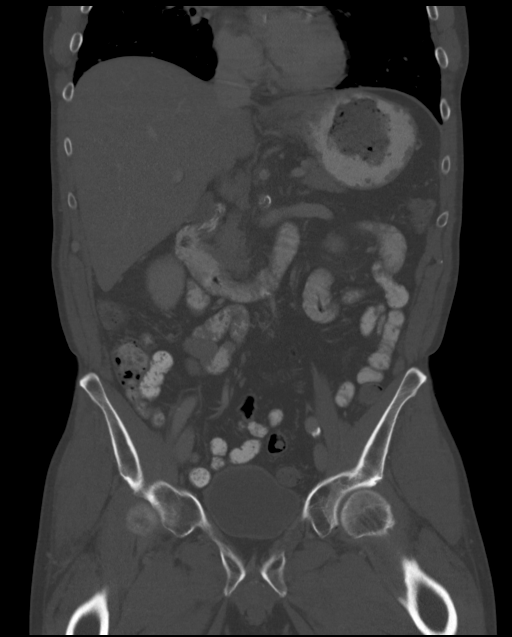
[im 44/80  soft-tissue]
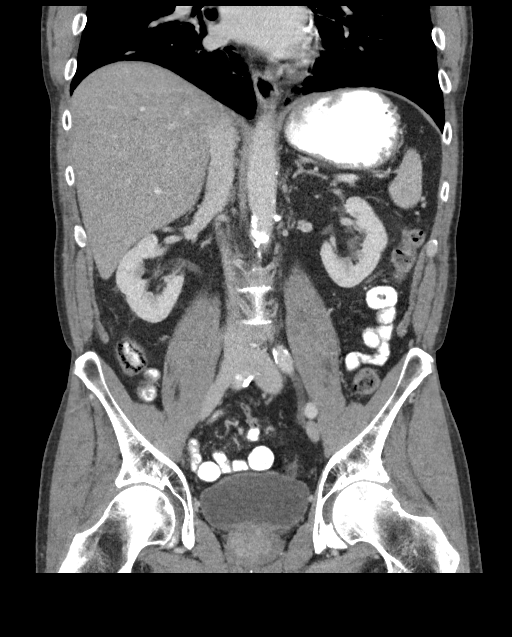

[Series 5: sagittal st · sagittal · 0.73mm/px · 1 of 106 slices shown, 2 images]
[im 36/106  soft-tissue]
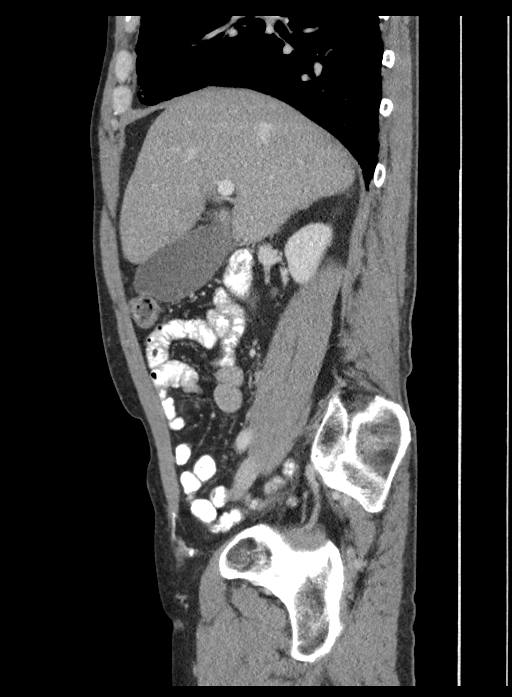
[im 36/106  bone]
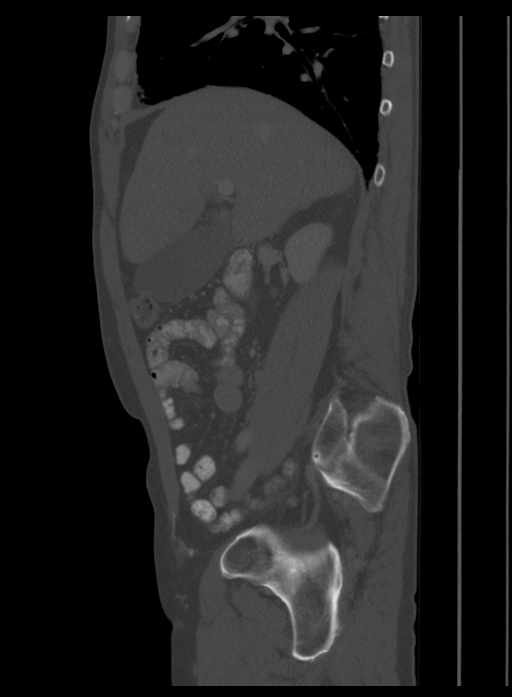

[Series 7: delay · axial · delayed · 0.74mm/px · z∈[+1124,+1219]mm · 7 of 27 slices shown, 12 images]
[im 4/27  soft-tissue]
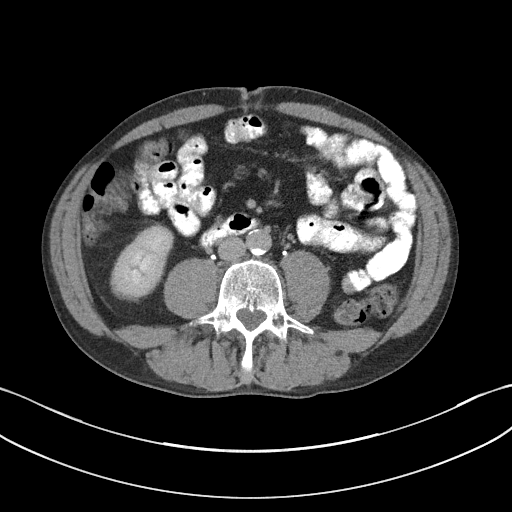
[im 4/27  bone]
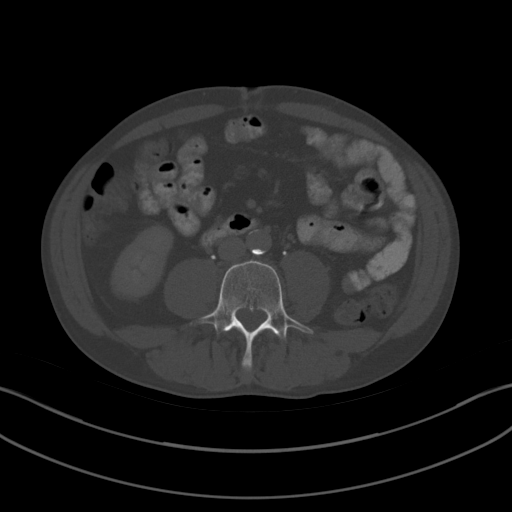
[im 7/27  soft-tissue]
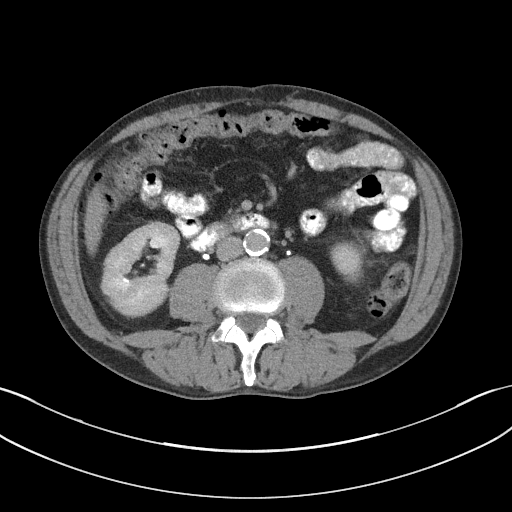
[im 10/27  soft-tissue]
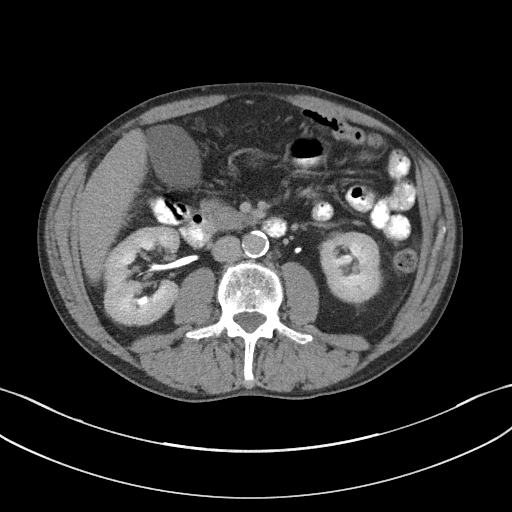
[im 14/27  soft-tissue]
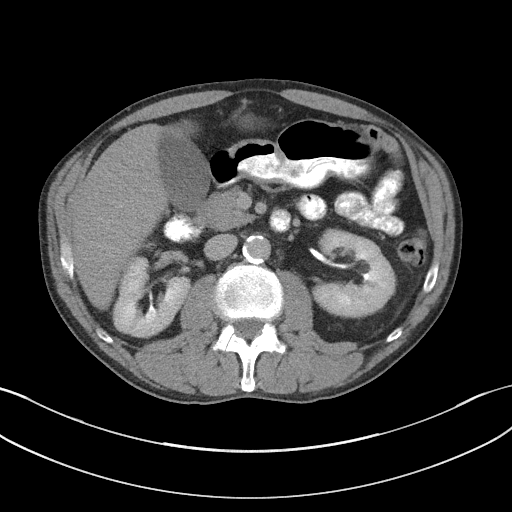
[im 14/27  lung]
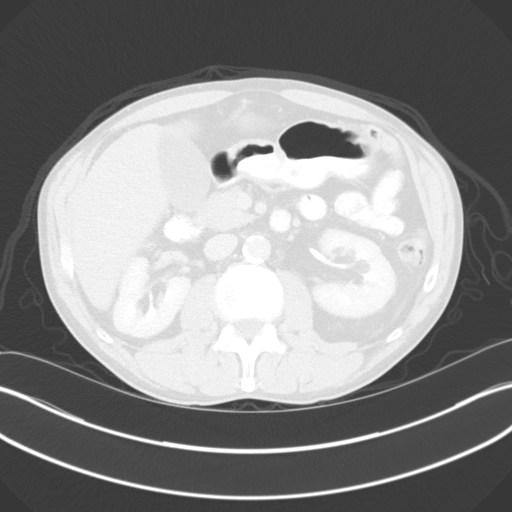
[im 17/27  soft-tissue]
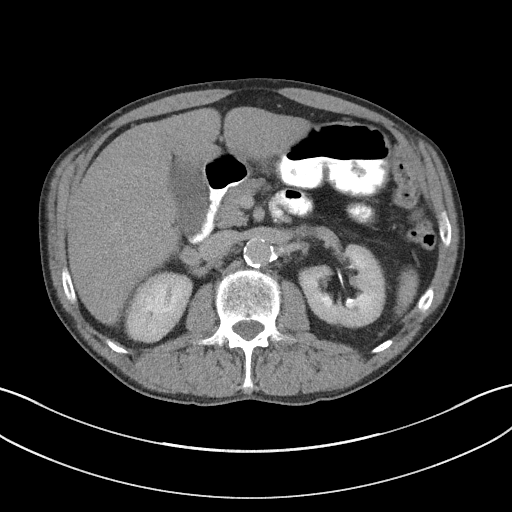
[im 17/27  lung]
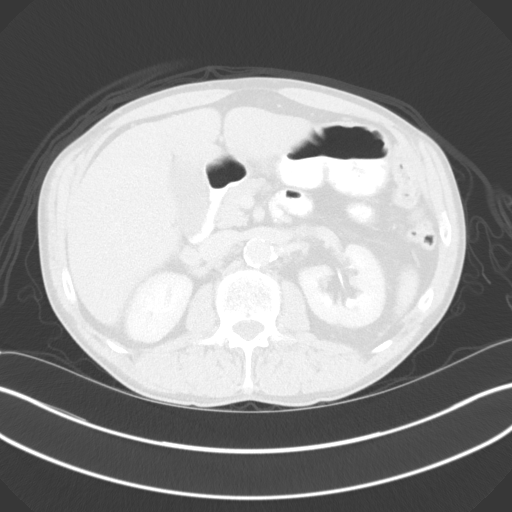
[im 20/27  soft-tissue]
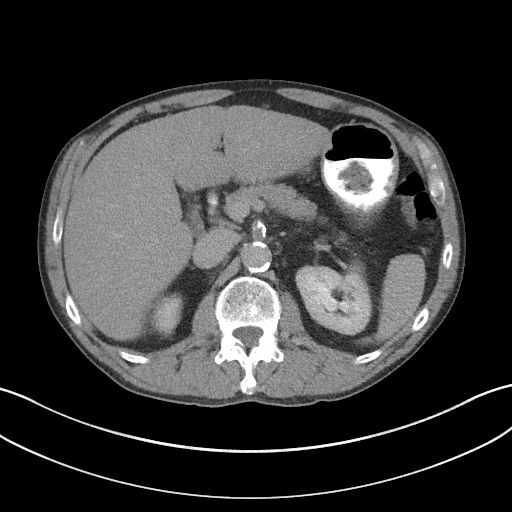
[im 20/27  lung]
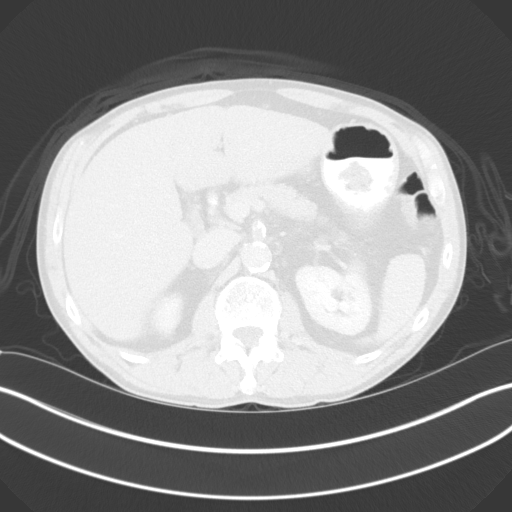
[im 23/27  soft-tissue]
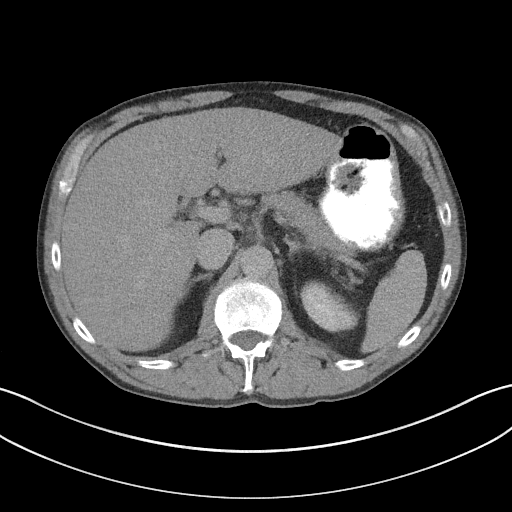
[im 23/27  lung]
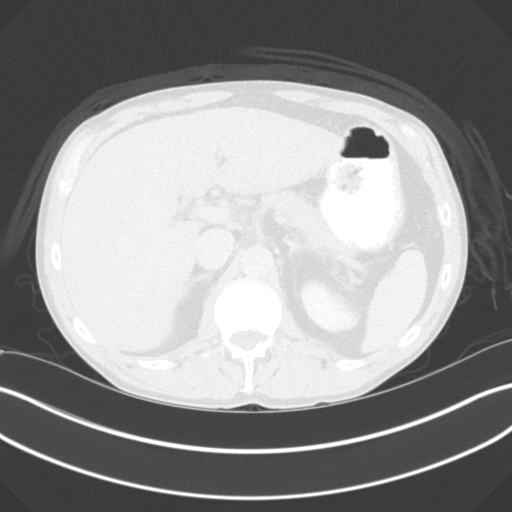

[11 of 46 positions shown; findings below may reference images not displayed]

FINDINGS: Lower Chest: No acute findings.

Hepatobiliary: No hepatic masses identified. Mild diffuse hepatic
steatosis noted. Gallbladder is unremarkable. No evidence of biliary
ductal dilatation.

Pancreas:  No mass or inflammatory changes.

Spleen: Within normal limits in size and appearance.

Adrenals/Urinary Tract: No masses identified. 2 mm calculus noted in
lower pole of left kidney. No evidence of ureteral calculi or
hydronephrosis. Unremarkable unopacified urinary bladder.

Stomach/Bowel: No evidence of obstruction, inflammatory process or
abnormal fluid collections.

Vascular/Lymphatic: No pathologically enlarged lymph nodes. No acute
vascular findings. Aortic atherosclerotic calcification noted.

Reproductive:  No mass or other significant abnormality.

Other:  None.

Musculoskeletal:  No suspicious bone lesions identified.
IMPRESSION: No acute findings within the abdomen or pelvis.

Tiny nonobstructing left renal calculus.

Mild hepatic steatosis.

Aortic Atherosclerosis ([id number]-H52.2).

## 2023-07-05 DIAGNOSIS — H43812 Vitreous degeneration, left eye: Secondary | ICD-10-CM | POA: Diagnosis not present

## 2023-07-05 DIAGNOSIS — H35372 Puckering of macula, left eye: Secondary | ICD-10-CM | POA: Diagnosis not present

## 2023-07-05 DIAGNOSIS — H43392 Other vitreous opacities, left eye: Secondary | ICD-10-CM | POA: Diagnosis not present

## 2023-07-05 DIAGNOSIS — H353132 Nonexudative age-related macular degeneration, bilateral, intermediate dry stage: Secondary | ICD-10-CM | POA: Diagnosis not present

## 2023-07-21 ENCOUNTER — Other Ambulatory Visit: Payer: Self-pay | Admitting: Cardiology

## 2023-09-14 DIAGNOSIS — L309 Dermatitis, unspecified: Secondary | ICD-10-CM | POA: Diagnosis not present

## 2023-09-14 DIAGNOSIS — L720 Epidermal cyst: Secondary | ICD-10-CM | POA: Diagnosis not present

## 2023-09-14 DIAGNOSIS — H61001 Unspecified perichondritis of right external ear: Secondary | ICD-10-CM | POA: Diagnosis not present

## 2023-09-14 DIAGNOSIS — L570 Actinic keratosis: Secondary | ICD-10-CM | POA: Diagnosis not present

## 2023-10-18 ENCOUNTER — Encounter: Payer: Self-pay | Admitting: Family Medicine

## 2023-10-18 ENCOUNTER — Encounter: Payer: Self-pay | Admitting: Cardiology

## 2023-10-18 NOTE — Telephone Encounter (Signed)
 Called patient to schedule appointment. He asked if he could call me back. Looking at Dr. Valorie schedule. First available is in September

## 2023-10-18 NOTE — Telephone Encounter (Signed)
 Appt made for the end of August

## 2023-10-27 ENCOUNTER — Encounter: Payer: Self-pay | Admitting: Family Medicine

## 2023-10-27 ENCOUNTER — Ambulatory Visit: Admitting: Family Medicine

## 2023-10-27 VITALS — BP 134/62 | HR 78 | Temp 98.5°F | Resp 15 | Ht 70.0 in | Wt 160.2 lb

## 2023-10-27 DIAGNOSIS — R7989 Other specified abnormal findings of blood chemistry: Secondary | ICD-10-CM | POA: Diagnosis not present

## 2023-10-27 DIAGNOSIS — I10 Essential (primary) hypertension: Secondary | ICD-10-CM

## 2023-10-27 DIAGNOSIS — Z461 Encounter for fitting and adjustment of hearing aid: Secondary | ICD-10-CM | POA: Insufficient documentation

## 2023-10-27 DIAGNOSIS — Z951 Presence of aortocoronary bypass graft: Secondary | ICD-10-CM

## 2023-10-27 DIAGNOSIS — E785 Hyperlipidemia, unspecified: Secondary | ICD-10-CM | POA: Diagnosis not present

## 2023-10-27 DIAGNOSIS — D696 Thrombocytopenia, unspecified: Secondary | ICD-10-CM | POA: Diagnosis not present

## 2023-10-27 NOTE — Patient Instructions (Addendum)
 Keep follow up with hematology as planned in a few weeks.  Based on your last platelets at 30 I would consider holding the aspirin  for now to lessen risk of bleeding.  You can certainly reach out to your cardiologist to make sure he is okay with this for now until seen by hematology.  If you have any blood in your stool, dark tarry stools, or other acute bleeding be seen in the ER as we discussed.  I did place a referral for you to follow-up with gastroenterology to advise us  on any further testing or imaging if needed for your elevated liver test, especially with the low platelets as well.   No med changes at this time.  Thank you for bringing a copy of the outside labs.  I will follow-up with you in 6 months to review meds but let me know if there are any questions in the meantime.

## 2023-10-27 NOTE — Progress Notes (Signed)
 Subjective:  Patient ID: Timothy [name], male    DOB: [birth year]  Age: 73 y.o. MRN: [id number]  CC:  Chief Complaint  Patient presents with   Follow-up    No questions or concerns. Brought in blood work from [place].     HPI [name] presents for follow-up.  Yearly visits at [facility], recent labs on 10/20/23.  Had tdap at [facility] last August, PNA vaccine at [facility] 3 days ago.    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  Completed cardiac rehab.  On high intensity statin, did not tolerate lisinopril  in combination with metoprolol , was continued on Toprol -XL 25 mg.  Cardiologist Dr. Bernie -appt 03/18/23 - decreased atorvastatin  d/t cramps - 80mg  to 40mg . Helped cramps, but some still there. Addition of coQ10 has helped.  Carotid doppler scheduled 9/12 at [facility].  Home readings: 120-130/60-65 Exercising 5-6 days per week. No CP/DOE.  BP Readings from Last 3 Encounters:  10/27/23 134/62  03/18/23 138/68  02/06/23 131/75   Lab Results  Component Value Date   CREATININE 0.99 07/28/2022   Hyperlipidemia: Lipitor  40 mg daily with coQ10 - has managed cramps.  [facility] noted from 10/20/2023.  Total cholesterol 112, HDL 19, LDL 45, triglycerides 439. Was not fasting for these labs.   Lab Results  Component Value Date   CHOL 110 07/28/2022   HDL 24.40 (L) 07/28/2022   LDLCALC 80 09/28/2021   LDLDIRECT 25.0 07/28/2022   TRIG 379.0 (H) 07/28/2022   CHOLHDL 4 07/28/2022   Lab Results  Component Value Date   ALT 100 (H) 07/28/2022   ALT 100 (H) 07/28/2022   AST 146 (H) 07/28/2022   AST 146 (H) 07/28/2022   ALKPHOS 81 07/28/2022   ALKPHOS 81 07/28/2022   BILITOT 1.4 (H) 07/28/2022   BILITOT 1.4 (H) 07/28/2022   Elevated LFTs, hepatic steatosis seen by Dr. Abran previously with gastroenterology, ultrasound in 2021 with increased echogenicity indicative of hepatic steatosis without focal liver lesions.  Mild hepatic steatosis on abdomen  CT in 2022.  Few beers per night when discussed previously, home brewer prior - quit.  Alcohol - 3 per day.  Light beers.  Denies addiction or difficulty cutting back.  Colonoscopy 01/2023.  No recent liver imaging.  LFTs on labs from 10/20/2023, AST 125, ALT 109, alk phos 94.  Total bilirubin 1.4.  Albumin  3.8. Lab Results  Component Value Date   ALT 100 (H) 07/28/2022   ALT 100 (H) 07/28/2022   AST 146 (H) 07/28/2022   AST 146 (H) 07/28/2022   ALKPHOS 81 07/28/2022   ALKPHOS 81 07/28/2022   BILITOT 1.4 (H) 07/28/2022   BILITOT 1.4 (H) 07/28/2022     Thrombocytopenia Platelets 86 in 2023.  Plan for repeat labs, has had lab work performed elsewhere through [facility]. Most recent CBC with platelets 30 on 10/20/23. Repeat bloodwork on Monday - told was similar  Does have easy bruising. Denies melana/hematochezia.  Scheduled to see hematology on 9/16 at [facility].  Remainder of CBC noted from 821, WBC 4.9, normal, hemoglobin 11.8. On ASA 81mg  per day.   Creatinine was normal at 0.97 with EGFR 83 A1c was normal at 4.8. TSH normal at 1.59 On paperwork his blood pressure was 126/61 vitals at that visit.  It appears they also ordered an iron panel and B12 along with repeat CBC at his visit later this week.  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   Coronary artery disease 02/15/2023   Abnormal liver function 02/15/2023   Localized osteoarthrosis 02/15/2023   Encounter for post surgical wound check 01/04/2022   Postop check 11/09/2021   S/P CABG x 3 09/30/2021   NSTEMI (non-ST elevated myocardial infarction) (HCC) 09/27/2021   Hyperlipidemia 06/24/2021   Aortic atherosclerosis (HCC) 06/24/2021   Pain of left hip joint 05/23/2021   Pseudophakia, both eyes 11/18/2020   Pancytopenia (HCC)  10/17/2020   Hyponatremia 10/17/2020   Hypertension    Chronic kidney disease    COVID-19 01/2020   Pain in joint of right knee 11/28/2019   Steatosis of liver 08/24/2019   Bilateral hearing loss 08/24/2019   Hypertriglyceridemia 08/15/2019   Follow-up examination after eye surgery 07/19/2019   Right epiretinal membrane 07/19/2019   Left epiretinal membrane 07/19/2019   Posterior vitreous detachment of left eye 07/19/2019   Atypical chest pain 07/13/2019   Subacromial impingement 01/16/2019   Elevated LFTs 09/02/2018   Need for shingles vaccine 09/02/2018   Healthcare maintenance 08/22/2018   Need for influenza vaccination 12/22/2017   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/09/2017   Essential hypertension 04/07/2016   History of colonic polyps 02/12/2009   Dyslipidemia 11/21/2007   Osteoarthritis 01/17/2007   NEPHROLITHIASIS, HX OF 01/17/2007   Past Medical History:  Diagnosis Date   Atypical chest pain 07/13/2019   Bilateral hearing loss 08/24/2019   Cataract    Cataract    Chronic kidney disease    kidney stones   Chronic pain of left knee 11/28/2019   COLONIC POLYPS, HX OF 02/12/2009   Coronary artery disease    COVID-19 01/2020   Dyslipidemia 11/21/2007   Qualifier: Diagnosis of  By: Jame  MD, Maude FALCON    Elevated LFTs 09/02/2018   Essential hypertension 04/07/2016   Treatment initiated February 2018   Follow-up examination after eye surgery 07/19/2019   Healthcare maintenance 08/22/2018   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/09/2017   HYPERLIPIDEMIA 11/21/2007   Hypertension    Hypertriglyceridemia 08/15/2019   Left epiretinal membrane 07/19/2019   Need for influenza vaccination 12/22/2017   Need for shingles vaccine 09/02/2018   NEPHROLITHIASIS, HX OF 01/17/2007   OSTEOARTHRITIS 01/17/2007   Osteoarthritis 01/17/2007   Qualifier: Diagnosis of  By: Jame  MD, Maude FALCON    Posterior vitreous detachment of left eye 07/19/2019   Right epiretinal  membrane 07/19/2019   Steatosis of liver 08/24/2019   Subacromial impingement 01/16/2019   Past Surgical History:  Procedure Laterality Date   APPENDECTOMY     CARDIAC CATHETERIZATION     CARPAL TUNNEL RELEASE Right    CATARACT EXTRACTION Bilateral    june 2019   CORONARY ARTERY BYPASS GRAFT N/A 09/30/2021   Procedure: CORONARY ARTERY BYPASS GRAFTING (CABG) X 3 BYPASSES USING LEFT INTERNAL MAMMARY ARTERY AND RIGHT LEG ENDOHARVESTED SAPHENOUS VEIN GRAFT;  Surgeon: Obadiah Maude, MD;  Location: [facility] OR;  Service: Open Heart Surgery;  Laterality: N/A;   EYE SURGERY     HERNIA REPAIR     ingunial   JOINT REPLACEMENT     KNEE SURGERY Right    x 4   LEFT HEART CATH AND CORONARY ANGIOGRAPHY N/A 09/28/2021   Procedure: LEFT HEART CATH AND CORONARY ANGIOGRAPHY;  Surgeon: Court Dorn PARAS, MD;  Location: [facility] INVASIVE  CV LAB;  Service: Cardiovascular;  Laterality: N/A;   MENISCUS REPAIR Left    knee; x 3   REPLACEMENT TOTAL KNEE Right    SHOULDER SURGERY     left   TEE WITHOUT CARDIOVERSION N/A 09/30/2021   Procedure: TRANSESOPHAGEAL ECHOCARDIOGRAM (TEE);  Surgeon: Obadiah Coy, MD;  Location: [facility] OR;  Service: Open Heart Surgery;  Laterality: N/A;   TOTAL KNEE ARTHROPLASTY     right   No Known Allergies Prior to Admission medications   Medication Sig Start Date End Date Taking? Authorizing Provider  aspirin  EC 81 MG tablet Take 81 mg by mouth daily. 10/24/23  Yes [provider]  atorvastatin  (LIPITOR ) 40 MG tablet Take 1 tablet (40 mg total) by mouth daily. 07/21/23  Yes Krasowski, Robert J, MD  metoprolol  succinate (TOPROL -XL) 25 MG 24 hr tablet Take 1 tablet (25 mg total) by mouth daily. 07/21/23  Yes Krasowski, Robert J, MD  Multiple Vitamin (MULTIVITAMIN) tablet Take 1 tablet by mouth daily. Unknown strength   Yes [provider]  acetaminophen  (TYLENOL ) 325 MG tablet Take 2 tablets (650 mg total) by mouth every 6 (six) hours as needed for mild pain or moderate  pain. Patient not taking: Reported on 10/27/2023 10/05/21   Gold, Wayne E, PA-C  amoxicillin -clavulanate (AUGMENTIN ) 875-125 MG tablet Take 1 tablet by mouth 2 (two) times daily. Patient not taking: Reported on 10/27/2023 02/06/23   Christopher Savannah, PA-C  betamethasone  dipropionate (DIPROLENE ) 0.05 % ointment Apply topically 2 (two) times daily. Patient not taking: Reported on 10/27/2023 11/27/22   Christopher Savannah, PA-C  cetirizine  (ZYRTEC  ALLERGY) 10 MG tablet Take 1 tablet (10 mg total) by mouth daily. Patient not taking: Reported on 10/27/2023 02/06/23   Christopher Savannah, PA-C  hydrOXYzine  (ATARAX ) 25 MG tablet Take 0.5 tablets (12.5 mg total) by mouth every 8 (eight) hours as needed for itching. Patient not taking: Reported on 10/27/2023 11/27/22   Christopher Savannah, PA-C  meloxicam  (MOBIC ) 7.5 MG tablet Take 1 tablet (7.5 mg total) by mouth daily as needed for pain. Patient not taking: Reported on 10/27/2023 05/13/21   Levora Reyes SAUNDERS, MD  pseudoephedrine  (SUDAFED) 30 MG tablet Take 1 tablet (30 mg total) by mouth every 8 (eight) hours as needed for congestion. Patient not taking: Reported on 10/27/2023 02/06/23   Christopher Savannah, PA-C  triamcinolone cream (KENALOG) 0.1 % Apply 1 Application topically daily as needed (For rash). Patient not taking: Reported on 10/27/2023 08/23/21   [provider]   Social History   Socioeconomic History   Marital status: Married    Spouse name: Not on file   Number of children: 2   Years of education: 16   Highest education level: Bachelor's degree (e.g., BA, AB, BS)  Occupational History   Not on file  Tobacco Use   Smoking status: Never   Smokeless tobacco: Never  Vaping Use   Vaping status: Never Used  Substance and Sexual Activity   Alcohol use: Yes    Alcohol/week: 10.0 standard drinks of alcohol    Types: 10 Cans of beer per week    Comment: 1-2 beers a day   Drug use: Never   Sexual activity: Not Currently  Other Topics Concern   Not on file  Social History  Narrative   ** Merged History Encounter **       Social Drivers of Health   Financial Resource Strain: Low Risk  (10/24/2023)   Overall Financial Resource Strain (CARDIA)    Difficulty of  Paying Living Expenses: Not hard at all  Food Insecurity: No Food Insecurity (10/24/2023)   Hunger Vital Sign    Worried About Running Out of Food in the Last Year: Never true    Ran Out of Food in the Last Year: Never true  Transportation Needs: No Transportation Needs (10/24/2023)   PRAPARE - Administrator, Civil Service (Medical): No    Lack of Transportation (Non-Medical): No  Physical Activity: Sufficiently Active (10/24/2023)   Exercise Vital Sign    Days of Exercise per Week: 5 days    Minutes of Exercise per Session: 60 min  Stress: No Stress Concern Present (10/24/2023)   [employer] of Occupational Health - Occupational Stress Questionnaire    Feeling of Stress: Not at all  Social Connections: Socially Integrated (10/24/2023)   Social Connection and Isolation Panel    Frequency of Communication with Friends and Family: More than three times a week    Frequency of Social Gatherings with Friends and Family: Twice a week    Attends Religious Services: More than 4 times per year    Active Member of [employer] or Organizations: Yes    Attends Banker Meetings: More than 4 times per year    Marital Status: Married  Catering manager Violence: Not At Risk (02/15/2023)   Humiliation, Afraid, Rape, and Kick questionnaire    Fear of Current or Ex-Partner: No    Emotionally Abused: No    Physically Abused: No    Sexually Abused: No    Review of Systems per HPI.  Objective:   Vitals:   10/27/23 1513  BP: 134/62  Pulse: 78  Resp: 15  Temp: 98.5 F (36.9 C)  TempSrc: Temporal  SpO2: 99%  Weight: 160 lb 3.2 oz (72.7 kg)  Height: 5' 10 (1.778 m)     Physical Exam Vitals reviewed.  Constitutional:      Appearance: He is well-developed.  HENT:     Head:  Normocephalic and atraumatic.  Neck:     Vascular: No carotid bruit or JVD.  Cardiovascular:     Rate and Rhythm: Normal rate and regular rhythm.     Heart sounds: Normal heart sounds. No murmur heard. Pulmonary:     Effort: Pulmonary effort is normal.     Breath sounds: Normal breath sounds. No rales.  Musculoskeletal:     Right lower leg: No edema.     Left lower leg: No edema.  Skin:    General: Skin is warm and dry.     Comments: Few healing bruises on forearms.  Neurological:     Mental Status: He is alert and oriented to person, place, and time.  Psychiatric:        Mood and Affect: Mood normal.        Assessment & Plan:  Manus Weedman is a 73 y.o. male . Elevated LFTs - Plan: Ambulatory referral to Gastroenterology  - History of similar as above, asymptomatic.  Recommended against use of alcohol at this time and follow-up with GI to decide on further testing, imaging especially with his thrombocytopenia..  Referral placed.  Essential hypertension -Stable on current regimen, continue same.  S/P CABG x 3  - Routine follow-up with cardiology as above, [facility] as [facility].  Asymptomatic.  Thrombocytopenia (HCC) - Plan: Ambulatory referral to Gastroenterology  - Concerns discussed with his most recent low platelets, lower than prior readings.  Some easy bruising but denies other acute  bleeding at this time.  Follow-up testing planned through the [place].  GI follow-up as above.  ER precautions given if any new bleeding given the low platelets on most recent testing, potentially may need transfusion.  Understanding of plan expressed. I did recommend he stop aspirin  at this time given low platelets and increased bruising, can discuss with his cardiologist if needed but may be safest to pause aspirin  temporarily.  No orders of the defined types were placed in this encounter.  Patient Instructions  Keep follow up with hematology as planned in a  few weeks.  Based on your last platelets at 30 I would consider holding the aspirin  for now to lessen risk of bleeding.  You can certainly reach out to your cardiologist to make sure he is okay with this for now until seen by hematology.  If you have any blood in your stool, dark tarry stools, or other acute bleeding be seen in the ER as we discussed.  I did place a referral for you to follow-up with gastroenterology to advise us  on any further testing or imaging if needed for your elevated liver test, especially with the low platelets as well.   No med changes at this time.  Thank you for bringing a copy of the outside labs.  I will follow-up with you in 6 months to review meds but let me know if there are any questions in the meantime.    Signed,   Reyes Pines, MD [facility], [facility] 10/27/23 4:19 PM

## 2023-10-30 ENCOUNTER — Encounter: Payer: Self-pay | Admitting: Family Medicine

## 2023-12-26 ENCOUNTER — Encounter: Payer: Self-pay | Admitting: *Deleted

## 2023-12-26 DIAGNOSIS — D693 Immune thrombocytopenic purpura: Secondary | ICD-10-CM | POA: Insufficient documentation

## 2023-12-27 ENCOUNTER — Ambulatory Visit: Admitting: Cardiology

## 2023-12-30 ENCOUNTER — Ambulatory Visit: Attending: Cardiology | Admitting: Cardiology

## 2023-12-30 VITALS — BP 128/56 | HR 74 | Ht 70.0 in | Wt 154.0 lb

## 2023-12-30 DIAGNOSIS — I10 Essential (primary) hypertension: Secondary | ICD-10-CM | POA: Diagnosis not present

## 2023-12-30 DIAGNOSIS — C9480 Other specified leukemias not having achieved remission: Secondary | ICD-10-CM | POA: Insufficient documentation

## 2023-12-30 DIAGNOSIS — D693 Immune thrombocytopenic purpura: Secondary | ICD-10-CM | POA: Insufficient documentation

## 2023-12-30 DIAGNOSIS — R0609 Other forms of dyspnea: Secondary | ICD-10-CM | POA: Diagnosis not present

## 2023-12-30 DIAGNOSIS — E785 Hyperlipidemia, unspecified: Secondary | ICD-10-CM | POA: Diagnosis not present

## 2023-12-30 DIAGNOSIS — C91Z0 Other lymphoid leukemia not having achieved remission: Secondary | ICD-10-CM | POA: Diagnosis not present

## 2023-12-30 DIAGNOSIS — I2510 Atherosclerotic heart disease of native coronary artery without angina pectoris: Secondary | ICD-10-CM | POA: Insufficient documentation

## 2023-12-30 DIAGNOSIS — Z951 Presence of aortocoronary bypass graft: Secondary | ICD-10-CM | POA: Insufficient documentation

## 2023-12-30 NOTE — Addendum Note (Signed)
 Addended by: ARLOA PLANAS D on: 12/30/2023 10:16 AM   Modules accepted: Orders

## 2023-12-30 NOTE — Progress Notes (Signed)
 Cardiology Office Note:    Date:  12/30/2023   ID:  Timothy [name], DOB October 12, 1950, MRN [id number]  PCP:  Levora Reyes SAUNDERS, MD  Cardiologist:  Lamar Fitch, MD    Referring MD: Levora Reyes SAUNDERS, MD   No chief complaint on file.   History of Present Illness:    Timothy [name] is a 73 y.o. male past medical history significant for coronary artery disease in August 2023 he required coronary bypass graft with LIMA to LAD, SVG to circumflex, SVG to posterior descending.  In phase of non-STEMI, likely his left ventricular ejection fraction is preserved.  Additional problem include essential hypertension, dyslipidemia, thrombocytopenia and recently diagnosed leukemia.  Comes today to months for follow-up still doing very well.  Goes to gym on the regular basis exercise with no thoughts any decreased ability to exercise, no chest pain tightness squeezing pressure mid chest some shortness of breath  Past Medical History:  Diagnosis Date   Atypical chest pain 07/13/2019   Bilateral hearing loss 08/24/2019   Cataract    Cataract    Chronic kidney disease    kidney stones   Chronic pain of left knee 11/28/2019   COLONIC POLYPS, HX OF 02/12/2009   Coronary artery disease    COVID-19 01/2020   Dyslipidemia 11/21/2007   Qualifier: Diagnosis of  By: Jame  MD, Maude FALCON    Elevated LFTs 09/02/2018   Essential hypertension 04/07/2016   Treatment initiated February 2018   Follow-up examination after eye surgery 07/19/2019   Healthcare maintenance 08/22/2018   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/09/2017   HYPERLIPIDEMIA 11/21/2007   Hypertension    Hypertriglyceridemia 08/15/2019   Left epiretinal membrane 07/19/2019   Need for influenza vaccination 12/22/2017   Need for shingles vaccine 09/02/2018   NEPHROLITHIASIS, HX OF 01/17/2007   OSTEOARTHRITIS 01/17/2007   Osteoarthritis 01/17/2007   Qualifier: Diagnosis of  By: Jame  MD, Maude FALCON    Posterior  vitreous detachment of left eye 07/19/2019   Right epiretinal membrane 07/19/2019   Steatosis of liver 08/24/2019   Subacromial impingement 01/16/2019    Past Surgical History:  Procedure Laterality Date   APPENDECTOMY     CARDIAC CATHETERIZATION     CARPAL TUNNEL RELEASE Right    CATARACT EXTRACTION Bilateral    june 2019   CORONARY ARTERY BYPASS GRAFT N/A 09/30/2021   Procedure: CORONARY ARTERY BYPASS GRAFTING (CABG) X 3 BYPASSES USING LEFT INTERNAL MAMMARY ARTERY AND RIGHT LEG ENDOHARVESTED SAPHENOUS VEIN GRAFT;  Surgeon: Obadiah Maude, MD;  Location: [facility] OR;  Service: Open Heart Surgery;  Laterality: N/A;   EYE SURGERY     HERNIA REPAIR     ingunial   JOINT REPLACEMENT     KNEE SURGERY Right    x 4   LEFT HEART CATH AND CORONARY ANGIOGRAPHY N/A 09/28/2021   Procedure: LEFT HEART CATH AND CORONARY ANGIOGRAPHY;  Surgeon: Court Dorn PARAS, MD;  Location: [facility] INVASIVE CV LAB;  Service: Cardiovascular;  Laterality: N/A;   MENISCUS REPAIR Left    knee; x 3   REPLACEMENT TOTAL KNEE Right    SHOULDER SURGERY     left   TEE WITHOUT CARDIOVERSION N/A 09/30/2021   Procedure: TRANSESOPHAGEAL ECHOCARDIOGRAM (TEE);  Surgeon: Obadiah Maude, MD;  Location: [facility] OR;  Service: Open Heart Surgery;  Laterality: N/A;   TOTAL KNEE ARTHROPLASTY     right    Current Medications: Current Meds  Medication Sig   atorvastatin  (LIPITOR ) 40 MG tablet Take 1  tablet (40 mg total) by mouth daily.   metoprolol  succinate (TOPROL -XL) 25 MG 24 hr tablet Take 1 tablet (25 mg total) by mouth daily.   Multiple Vitamin (MULTIVITAMIN) tablet Take 1 tablet by mouth daily. Unknown strength     Allergies:   Patient has no known allergies.   Social History   Socioeconomic History   Marital status: Married    Spouse name: Not on file   Number of children: 2   Years of education: 16   Highest education level: Bachelor's degree (e.g., BA, AB, BS)  Occupational History   Not on file  Tobacco Use   Smoking  status: Never   Smokeless tobacco: Never  Vaping Use   Vaping status: Never Used  Substance and Sexual Activity   Alcohol use: Yes    Alcohol/week: 10.0 standard drinks of alcohol    Types: 10 Cans of beer per week    Comment: 1-2 beers a day   Drug use: Never   Sexual activity: Not Currently  Other Topics Concern   Not on file  Social History Narrative   ** Merged History Encounter **       Social Drivers of Health   Financial Resource Strain: Low Risk  (10/24/2023)   Overall Financial Resource Strain (CARDIA)    Difficulty of Paying Living Expenses: Not hard at all  Food Insecurity: No Food Insecurity (10/24/2023)   Hunger Vital Sign    Worried About Running Out of Food in the Last Year: Never true    Ran Out of Food in the Last Year: Never true  Transportation Needs: No Transportation Needs (10/24/2023)   PRAPARE - Administrator, Civil Service (Medical): No    Lack of Transportation (Non-Medical): No  Physical Activity: Sufficiently Active (10/24/2023)   Exercise Vital Sign    Days of Exercise per Week: 5 days    Minutes of Exercise per Session: 60 min  Stress: No Stress Concern Present (10/24/2023)   [employer] of Occupational Health - Occupational Stress Questionnaire    Feeling of Stress: Not at all  Social Connections: Socially Integrated (10/24/2023)   Social Connection and Isolation Panel    Frequency of Communication with Friends and Family: More than three times a week    Frequency of Social Gatherings with Friends and Family: Twice a week    Attends Religious Services: More than 4 times per year    Active Member of [employer] or Organizations: Yes    Attends Engineer, Structural: More than 4 times per year    Marital Status: Married     Family History: The patient's family history includes Colon cancer (age of onset: 10) in his father. There is no history of Esophageal cancer, Rectal cancer, Stomach cancer, Pancreatic cancer, or Liver  disease. ROS:   Please see the history of present illness.    All 14 point review of systems negative except as described per history of present illness  EKGs/Labs/Other Studies Reviewed:         Recent Labs: No results found for requested labs within last 365 days.  Recent Lipid Panel    Component Value Date/Time   CHOL 110 07/28/2022 0833   CHOL 212 (H) 03/16/2021 0903   TRIG 379.0 (H) 07/28/2022 0833   HDL 24.40 (L) 07/28/2022 0833   HDL 64 03/16/2021 0903   CHOLHDL 4 07/28/2022 0833   VLDL 75.8 (H) 07/28/2022 0833   LDLCALC 80 09/28/2021 0235   LDLCALC 124 (H)  03/16/2021 0903   LDLCALC 61 08/22/2018 1109   LDLDIRECT 25.0 07/28/2022 0833    Physical Exam:    VS:  BP (!) 128/56   Pulse 74   Ht 5' 10 (1.778 m)   Wt 154 lb (69.9 kg)   SpO2 98%   BMI 22.10 kg/m     Wt Readings from Last 3 Encounters:  12/30/23 154 lb (69.9 kg)  10/27/23 160 lb 3.2 oz (72.7 kg)  03/18/23 156 lb (70.8 kg)     GEN:  Well nourished, well developed in no acute distress HEENT: Normal NECK: No JVD; No carotid bruits LYMPHATICS: No lymphadenopathy CARDIAC: RRR, systolic murmur grade 1/6 to 2/6 best heard at right border of the sternum, no rubs, no gallops RESPIRATORY:  Clear to auscultation without rales, wheezing or rhonchi  ABDOMEN: Soft, non-tender, non-distended MUSCULOSKELETAL:  No edema; No deformity  SKIN: Warm and dry LOWER EXTREMITIES: no swelling NEUROLOGIC:  Alert and oriented x 3 PSYCHIATRIC:  Normal affect   ASSESSMENT:    1. Coronary artery disease involving native coronary artery of native heart without angina pectoris   2. Essential hypertension   3. Autoimmune thrombocytopenia (HCC)   4. Dyslipidemia   5. 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 Essential hypertension blood  pressure well-controlled continue present management. Dyslipidemia I did review KPN which show me his LDL 25 HDL 24 we will continue present management. T-cell large granular lymphocytic leukemia, follow-up by [facility].   Medication Adjustments/Labs and Tests Ordered: Current medicines are reviewed at length with the patient today.  Concerns regarding medicines are outlined above.  No orders of the defined types were placed in this encounter.  Medication changes: No orders of the defined types were placed in this encounter.   Signed, Lamar DOROTHA Fitch, MD, [facility] 12/30/2023 10:06 AM    [facility]

## 2023-12-30 NOTE — Patient Instructions (Signed)

## 2024-01-19 ENCOUNTER — Other Ambulatory Visit: Payer: Self-pay | Admitting: Cardiology

## 2024-01-19 MED ORDER — METOPROLOL SUCCINATE ER 25 MG PO TB24: 25.0000 mg | ORAL_TABLET | Freq: Every day | ORAL | 1 refills | Status: AC

## 2024-01-19 NOTE — Telephone Encounter (Signed)
 Rx refill sent to pharmacy.

## 2024-01-23 ENCOUNTER — Ambulatory Visit (HOSPITAL_BASED_OUTPATIENT_CLINIC_OR_DEPARTMENT_OTHER)
Admission: RE | Admit: 2024-01-23 | Discharge: 2024-01-23 | Disposition: A | Discharge status: Home / Self Care | Source: Ambulatory Visit | Attending: Cardiology | Admitting: Cardiology

## 2024-01-23 DIAGNOSIS — R0609 Other forms of dyspnea: Secondary | ICD-10-CM | POA: Diagnosis not present

## 2024-01-23 LAB — ECHOCARDIOGRAM COMPLETE
AR max vel: 1.08 cm2
AV Area VTI: 1.14 cm2
AV Area mean vel: 1.04 cm2
AV Mean grad: 14.3 mmHg
AV Peak grad: 25.2 mmHg
Ao pk vel: 2.51 m/s
Area-P 1/2: 2.99 cm2
Calc EF: 67 %
MV M vel: 4.98 m/s
MV Peak grad: 99.2 mmHg
S' Lateral: 3 cm
Single Plane A2C EF: 68 %
Single Plane A4C EF: 66.4 %

## 2024-01-25 ENCOUNTER — Ambulatory Visit: Payer: Self-pay | Admitting: Cardiology

## 2024-01-25 ENCOUNTER — Telehealth: Payer: Self-pay

## 2024-01-25 NOTE — Telephone Encounter (Signed)
 Left message on My Chart with Echo results per Dr. Karry note. Routed to PCP.

## 2024-01-30 ENCOUNTER — Telehealth: Payer: Self-pay

## 2024-01-30 NOTE — Telephone Encounter (Signed)
 Pt viewed Echo results on My Chart per Dr. Vanetta Shawl note. Routed to PCP.

## 2024-03-08 ENCOUNTER — Ambulatory Visit (INDEPENDENT_AMBULATORY_CARE_PROVIDER_SITE_OTHER): Admitting: *Deleted

## 2024-03-08 VITALS — Ht 70.0 in | Wt 154.0 lb

## 2024-03-08 DIAGNOSIS — Z0000 Encounter for general adult medical examination without abnormal findings: Secondary | ICD-10-CM

## 2024-03-08 NOTE — Patient Instructions (Signed)
 Mr. Timothy [name],  Thank you for taking the time for your Medicare Wellness Visit. I appreciate your continued commitment to your health goals. Please review the care plan we discussed, and feel free to reach out if I can assist you further.  Please note that Annual Wellness Visits do not include a physical exam. Some assessments may be limited, especially if the visit was conducted virtually. If needed, we may recommend an in-person follow-up with your provider.  Ongoing Care Seeing your primary care provider every 3 to 6 months helps us  monitor your health and provide consistent, personalized care.   Referrals If a referral was made during today's visit and you haven't received any updates within two weeks, please contact the referred provider directly to check on the status.  Recommended Screenings:  Health Maintenance  Topic Date Due   Flu Shot  05/29/2024*   Medicare Annual Wellness Visit  03/08/2025   Colon Cancer Screening  01/31/2028   DTaP/Tdap/Td vaccine (3 - Td or Tdap) 10/27/2032   Pneumococcal Vaccine for age over 85  Completed   Hepatitis C Screening  Completed   Zoster (Shingles) Vaccine  Completed   Meningitis B Vaccine  Aged Out   COVID-19 Vaccine  Discontinued  *Topic was postponed. The date shown is not the original due date.       03/08/2024    8:52 AM  Advanced Directives  Does Patient Have a Medical Advance Directive? Yes  Type of Advance Directive Healthcare Power of Attorney  Does patient want to make changes to medical advance directive? No - Patient declined  Copy of Healthcare Power of Attorney in Chart? No - copy requested    Vision: Annual vision screenings are recommended for early detection of glaucoma, cataracts, and diabetic retinopathy. These exams can also reveal signs of chronic conditions such as diabetes and high blood pressure.  Dental: Annual dental screenings help detect early signs of oral cancer, gum disease, and other conditions linked to  overall health, including heart disease and diabetes.  Please see the attached documents for additional preventive care recommendations.    Timothy [name] , Thank you for taking time to come for your Medicare Wellness Visit. I appreciate your ongoing commitment to your health goals. Please review the following plan we discussed and let me know if I can assist you in the future.   Screening recommendations/referrals: Colonoscopy:  Recommended yearly ophthalmology/optometry visit for glaucoma screening and checkup Recommended yearly dental visit for hygiene and checkup  Vaccinations: Influenza vaccine: Pneumococcal vaccine:  Tdap vaccine:  Shingles vaccine:         Preventive Care 65 Years and Older, Male Preventive care refers to lifestyle choices and visits with your health care provider that can promote health and wellness. What does preventive care include? A yearly physical exam. This is also called an annual well check. Dental exams once or twice a year. Routine eye exams. Ask your health care provider how often you should have your eyes checked. Personal lifestyle choices, including: Daily care of your teeth and gums. Regular physical activity. Eating a healthy diet. Avoiding tobacco and drug use. Limiting alcohol use. Practicing safe sex. Taking low doses of aspirin  every day. Taking vitamin and mineral supplements as recommended by your health care provider. What happens during an annual well check? The services and screenings done by your health care provider during your annual well check will depend on your age, overall health, lifestyle risk factors, and family history of disease. Counseling  Your  health care provider may ask you questions about your: Alcohol use. Tobacco use. Drug use. Emotional well-being. Home and relationship well-being. Sexual activity. Eating habits. History of falls. Memory and ability to understand (cognition). Work and work  astronomer. Screening  You may have the following tests or measurements: Height, weight, and BMI. Blood pressure. Lipid and cholesterol levels. These may be checked every 5 years, or more frequently if you are over 46 years old. Skin check. Lung cancer screening. You may have this screening every year starting at age 33 if you have a 30-pack-year history of smoking and currently smoke or have quit within the past 15 years. Fecal occult blood test (FOBT) of the stool. You may have this test every year starting at age 72. Flexible sigmoidoscopy or colonoscopy. You may have a sigmoidoscopy every 5 years or a colonoscopy every 10 years starting at age 47. Prostate cancer screening. Recommendations will vary depending on your family history and other risks. Hepatitis C blood test. Hepatitis B blood test. Sexually transmitted disease (STD) testing. Diabetes screening. This is done by checking your blood sugar (glucose) after you have not eaten for a while (fasting). You may have this done every 1-3 years. Abdominal aortic aneurysm (AAA) screening. You may need this if you are a current or former smoker. Osteoporosis. You may be screened starting at age 57 if you are at high risk. Talk with your health care provider about your test results, treatment options, and if necessary, the need for more tests. Vaccines  Your health care provider may recommend certain vaccines, such as: Influenza vaccine. This is recommended every year. Tetanus, diphtheria, and acellular pertussis (Tdap, Td) vaccine. You may need a Td booster every 10 years. Zoster vaccine. You may need this after age 84. Pneumococcal 13-valent conjugate (PCV13) vaccine. One dose is recommended after age 85. Pneumococcal polysaccharide (PPSV23) vaccine. One dose is recommended after age 41. Talk to your health care provider about which screenings and vaccines you need and how often you need them. This information is not intended to replace  advice given to you by your health care provider. Make sure you discuss any questions you have with your health care provider. Document Released: 03/14/2015 Document Revised: 11/05/2015 Document Reviewed: 12/17/2014 Elsevier Interactive Patient Education  2017 [employer].  Fall Prevention in the Home Falls can cause injuries. They can happen to people of all ages. There are many things you can do to make your home safe and to help prevent falls. What can I do on the outside of my home? Regularly fix the edges of walkways and driveways and fix any cracks. Remove anything that might make you trip as you walk through a door, such as a raised step or threshold. Trim any bushes or trees on the path to your home. Use bright outdoor lighting. Clear any walking paths of anything that might make someone trip, such as rocks or tools. Regularly check to see if handrails are loose or broken. Make sure that both sides of any steps have handrails. Any raised decks and porches should have guardrails on the edges. Have any leaves, snow, or ice cleared regularly. Use sand or salt on walking paths during winter. Clean up any spills in your garage right away. This includes oil or grease spills. What can I do in the bathroom? Use night lights. Install grab bars by the toilet and in the tub and shower. Do not use towel bars as grab bars. Use non-skid mats or decals in  the tub or shower. If you need to sit down in the shower, use a plastic, non-slip stool. Keep the floor dry. Clean up any water that spills on the floor as soon as it happens. Remove soap buildup in the tub or shower regularly. Attach bath mats securely with double-sided non-slip rug tape. Do not have throw rugs and other things on the floor that can make you trip. What can I do in the bedroom? Use night lights. Make sure that you have a light by your bed that is easy to reach. Do not use any sheets or blankets that are too big for your bed.  They should not hang down onto the floor. Have a firm chair that has side arms. You can use this for support while you get dressed. Do not have throw rugs and other things on the floor that can make you trip. What can I do in the kitchen? Clean up any spills right away. Avoid walking on wet floors. Keep items that you use a lot in easy-to-reach places. If you need to reach something above you, use a strong step stool that has a grab bar. Keep electrical cords out of the way. Do not use floor polish or wax that makes floors slippery. If you must use wax, use non-skid floor wax. Do not have throw rugs and other things on the floor that can make you trip. What can I do with my stairs? Do not leave any items on the stairs. Make sure that there are handrails on both sides of the stairs and use them. Fix handrails that are broken or loose. Make sure that handrails are as long as the stairways. Check any carpeting to make sure that it is firmly attached to the stairs. Fix any carpet that is loose or worn. Avoid having throw rugs at the top or bottom of the stairs. If you do have throw rugs, attach them to the floor with carpet tape. Make sure that you have a light switch at the top of the stairs and the bottom of the stairs. If you do not have them, ask someone to add them for you. What else can I do to help prevent falls? Wear shoes that: Do not have high heels. Have rubber bottoms. Are comfortable and fit you well. Are closed at the toe. Do not wear sandals. If you use a stepladder: Make sure that it is fully opened. Do not climb a closed stepladder. Make sure that both sides of the stepladder are locked into place. Ask someone to hold it for you, if possible. Clearly mark and make sure that you can see: Any grab bars or handrails. First and last steps. Where the edge of each step is. Use tools that help you move around (mobility aids) if they are needed. These  include: Canes. Walkers. Scooters. Crutches. Turn on the lights when you go into a dark area. Replace any light bulbs as soon as they burn out. Set up your furniture so you have a clear path. Avoid moving your furniture around. If any of your floors are uneven, fix them. If there are any pets around you, be aware of where they are. Review your medicines with your doctor. Some medicines can make you feel dizzy. This can increase your chance of falling. Ask your doctor what other things that you can do to help prevent falls. This information is not intended to replace advice given to you by your health care provider. Make sure you discuss  any questions you have with your health care provider. Document Released: 12/12/2008 Document Revised: 07/24/2015 Document Reviewed: 03/22/2014 Elsevier Interactive Patient Education  2017 [employer].

## 2024-03-08 NOTE — Progress Notes (Signed)
 "  Chief Complaint  Patient presents with   Medicare Wellness     Subjective:   Timothy [name] is a 74 y.o. male who presents for a Medicare Annual Wellness Visit.  No voiced or noted concerns at this time   Visit info / Clinical Intake: Medicare Wellness Visit Type:: Subsequent Annual Wellness Visit Persons participating in visit and providing information:: patient Medicare Wellness Visit Mode:: Telephone If telephone:: video declined Since this visit was completed virtually, some vitals may be partially provided or unavailable. Missing vitals are due to the limitations of the virtual format.: Unable to obtain vitals - no equipment If Telephone or Video please confirm:: I connected with patient using audio/video enable telemedicine. I verified patient identity with two identifiers, discussed telehealth limitations, and patient agreed to proceed. Patient Location:: home Provider Location:: home Interpreter Needed?: No Pre-visit prep was completed: no AWV questionnaire completed by patient prior to visit?: no Living arrangements:: lives with spouse/significant other Patient's Overall Health Status Rating: very good Typical amount of pain: none Does pain affect daily life?: no Are you currently prescribed opioids?: no  Dietary Habits and Nutritional Risks How many meals a day?: 3 Eats fruit and vegetables daily?: yes Most meals are obtained by: preparing own meals In the last 2 weeks, have you had any of the following?: none Diabetic:: no  Functional Status Activities of Daily Living (to include ambulation/medication): Independent Ambulation: Independent Medication Administration: Independent Home Management (perform basic housework or laundry): Independent Manage your own finances?: yes Primary transportation is: driving Concerns about vision?: no *vision screening is required for WTM* Concerns about hearing?: no  Fall Screening Falls in the past year?: 0 Number  of falls in past year: 0 Was there an injury with Fall?: 0 Fall Risk Category Calculator: 0 Patient Fall Risk Level: Low Fall Risk  Fall Risk Patient at Risk for Falls Due to: No Fall Risks Fall risk Follow up: Falls evaluation completed; Education provided; Falls prevention discussed  Home and Transportation Safety: All rugs have non-skid backing?: yes All stairs or steps have railings?: yes Grab bars in the bathtub or shower?: yes Have non-skid surface in bathtub or shower?: yes Good home lighting?: yes Regular seat belt use?: yes Hospital stays in the last year:: no  Cognitive Assessment Difficulty concentrating, remembering, or making decisions? : no Will 6CIT or Mini Cog be Completed: yes What year is it?: 0 points What month is it?: 0 points Give patient an address phrase to remember (5 components): its very sunny outside today in January About what time is it?: 0 points Count backwards from 20 to 1: 2 points Say the months of the year in reverse: 0 points Repeat the address phrase from earlier: 0 points 6 CIT Score: 2 points  Advance Directives (For Healthcare) Does Patient Have a Medical Advance Directive?: Yes Does patient want to make changes to medical advance directive?: No - Patient declined Type of Advance Directive: Healthcare Power of Attorney Copy of Healthcare Power of Attorney in Chart?: No - copy requested  Reviewed/Updated  Reviewed/Updated: Reviewed All (Medical, Surgical, Family, Medications, Allergies, Care Teams, Patient Goals); Surgical History; Family History; Medications; Allergies; Patient Goals; Medical History; Care Teams    Allergies (verified) Patient has no known allergies.   Current Medications (verified) Outpatient Encounter Medications as of 03/08/2024  Medication Sig   atorvastatin  (LIPITOR ) 40 MG tablet TAKE 1 TABLET(40 MG) BY MOUTH DAILY   metoprolol  succinate (TOPROL -XL) 25 MG 24 hr tablet Take 1 tablet (25  mg total) by mouth  daily.   Multiple Vitamin (MULTIVITAMIN) tablet Take 1 tablet by mouth daily. Unknown strength   No facility-administered encounter medications on file as of 03/08/2024.    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/09/2017   HYPERLIPIDEMIA 11/21/2007   Hypertension    Hypertriglyceridemia 08/15/2019   Left epiretinal membrane 07/19/2019   Need for influenza vaccination 12/22/2017   Need for shingles vaccine 09/02/2018   NEPHROLITHIASIS, HX OF 01/17/2007   OSTEOARTHRITIS 01/17/2007   Osteoarthritis 01/17/2007   Qualifier: Diagnosis of  By: Jame  MD, Maude FALCON    Posterior vitreous detachment of left eye 07/19/2019   Right epiretinal membrane 07/19/2019   Steatosis of liver 08/24/2019   Subacromial impingement 01/16/2019   Past Surgical History:  Procedure Laterality Date   APPENDECTOMY     CARDIAC CATHETERIZATION     CARPAL TUNNEL RELEASE Right    CATARACT EXTRACTION Bilateral    june 2019   CORONARY ARTERY BYPASS GRAFT N/A 09/30/2021   Procedure: CORONARY ARTERY BYPASS GRAFTING (CABG) X 3 BYPASSES USING LEFT INTERNAL MAMMARY ARTERY AND RIGHT LEG ENDOHARVESTED SAPHENOUS VEIN GRAFT;  Surgeon: Obadiah Maude, MD;  Location: [facility] OR;  Service: Open Heart Surgery;  Laterality: N/A;   EYE SURGERY     HERNIA REPAIR     ingunial   JOINT REPLACEMENT     KNEE SURGERY Right    x 4   LEFT HEART CATH AND  CORONARY ANGIOGRAPHY N/A 09/28/2021   Procedure: LEFT HEART CATH AND CORONARY ANGIOGRAPHY;  Surgeon: Court Dorn PARAS, MD;  Location: [facility] INVASIVE CV [facility];  Service: Cardiovascular;  Laterality: N/A;   MENISCUS REPAIR Left    knee; x 3   REPLACEMENT TOTAL KNEE Right    SHOULDER SURGERY     left   TEE WITHOUT CARDIOVERSION N/A 09/30/2021   Procedure: TRANSESOPHAGEAL ECHOCARDIOGRAM (TEE);  Surgeon: Obadiah Maude, MD;  Location: [facility] OR;  Service: Open Heart Surgery;  Laterality: N/A;   TOTAL KNEE ARTHROPLASTY     right   Family History  Problem Relation Age of Onset   Colon cancer Father 69   Esophageal cancer Neg Hx    Rectal cancer Neg Hx    Stomach cancer Neg Hx    Pancreatic cancer Neg Hx    Liver disease Neg Hx    Social History   Occupational History   Not on file  Tobacco Use   Smoking status: Never   Smokeless tobacco: Never  Vaping Use   Vaping status: Never Used  Substance and Sexual Activity   Alcohol use: Yes    Alcohol/week: 10.0 standard drinks of alcohol    Types: 10 Cans of beer per week    Comment: 1-2 beers a day   Drug use: Never   Sexual activity: Not Currently   Tobacco Counseling Counseling given: Not Answered  SDOH  Screenings   Food Insecurity: No Food Insecurity (03/08/2024)  Housing: Low Risk (03/08/2024)  Transportation Needs: No Transportation Needs (03/08/2024)  Utilities: Not At Risk (03/08/2024)  Alcohol Screen: Low Risk (03/05/2024)  Depression (PHQ2-9): Medium Risk (03/08/2024)  Financial Resource Strain: Low Risk (03/05/2024)  Physical Activity: Sufficiently Active (03/08/2024)  Social Connections: Socially Integrated (03/08/2024)  Stress: No Stress Concern Present (03/08/2024)  Tobacco Use: Low Risk (03/08/2024)  Health Literacy: Adequate Health Literacy (03/08/2024)   See flowsheets for full screening details  Depression Screen PHQ 2 & 9 Depression Scale- Over the past 2 weeks, how often have you been bothered by any of the following  problems? Little interest or pleasure in doing things: 1 Feeling down, depressed, or hopeless (PHQ Adolescent also includes...irritable): 1 PHQ-2 Total Score: 2 Trouble falling or staying asleep, or sleeping too much: 3 (going through treatment) Feeling tired or having little energy: 3 Poor appetite or overeating (PHQ Adolescent also includes...weight loss): 0 Feeling bad about yourself - or that you are a failure or have let yourself or your family down: 0 Trouble concentrating on things, such as reading the newspaper or watching television (PHQ Adolescent also includes...like school work): 0 Moving or speaking so slowly that other people could have noticed. Or the opposite - being so fidgety or restless that you have been moving around a lot more than usual: 0 Thoughts that you would be better off dead, or of hurting yourself in some way: 0 PHQ-9 Total Score: 8 If you checked off any problems, how difficult have these problems made it for you to do your work, take care of things at home, or get along with other people?: Not difficult at all     Goals Addressed             This Visit's Progress    Patient Stated       Continue current lifestyle     Patient Stated       Starting new treatment              Objective:    Today's Vitals   03/08/24 0851  Weight: 154 lb (69.9 kg)  Height: 5' 10 (1.778 m)   Body mass index is 22.1 kg/m.  Hearing/Vision screen Hearing Screening - Comments:: Bilateral hearing aids Vision Screening - Comments:: Timothy [name] Up to date Immunizations and Health Maintenance Health Maintenance  Topic Date Due   Influenza Vaccine  05/29/2024 (Originally 09/30/2023)   Medicare Annual Wellness (AWV)  03/08/2025   Colonoscopy  01/31/2028   DTaP/Tdap/Td (3 - Td or Tdap) 10/27/2032   Pneumococcal Vaccine: 50+ Years  Completed   Hepatitis C Screening  Completed   Zoster Vaccines- Shingrix  Completed   Meningococcal B Vaccine  Aged Out   COVID-19  Vaccine  Discontinued        Assessment/Plan:  This is a routine wellness examination for Timothy [name].  Patient Care Team: Levora Reyes SAUNDERS, MD as PCP - General (Family Medicine)  I have personally reviewed and noted the following in the patients chart:   Medical and social history Use of alcohol, tobacco or illicit drugs  Current medications and supplements including opioid prescriptions. Functional ability and status Nutritional status Physical activity Advanced directives List of other physicians Hospitalizations, surgeries, and ER visits in previous 12 months Vitals Screenings to include cognitive, depression, and falls Referrals and appointments  No orders of the defined types were placed in this encounter.  In addition, I have reviewed and discussed with patient  certain preventive protocols, quality metrics, and best practice recommendations. A written personalized care plan for preventive services as well as general preventive health recommendations were provided to patient.   Timothy Menchaca, LPN   09/30/7971   Return in 1 year (on 03/08/2025).  After Visit Summary: (MyChart) Due to this being a telephonic visit, the after visit summary with patients personalized plan was offered to patient via MyChart   Nurse Notes:  "

## 2024-03-14 ENCOUNTER — Ambulatory Visit: Payer: Self-pay

## 2024-03-14 NOTE — Telephone Encounter (Signed)
 FYI patient has an appointment for 03/15/2024 @9 :00am

## 2024-03-14 NOTE — Telephone Encounter (Signed)
 FYI Only or Action Required?: FYI only for provider: appointment scheduled on 03/15/24.  Patient was last seen in primary care on 10/27/2023 by Timothy Reyes SAUNDERS, MD.  Called Nurse Triage reporting Cough.  Symptoms began 4-6 weeks ago.  Interventions attempted: OTC medications: Benadryl, ibuprofen.  Symptoms are: gradually worsening.  Triage Disposition: See Physician Within 24 Hours (overriding See HCP Within 4 Hours (Or PCP Triage))  Patient/caregiver understands and will follow disposition?: Yes                  Copied from CRM #[id number]. Topic: Clinical - Red Word Triage >> Mar 14, 2024 10:55 AM Mesmerise C wrote: [facility] that prompted transfer to Nurse Triage: Patient's wife states has picked up the crud for 4-6 weeks, now has chest cough and chest congestion, ear infection has taken benadryl and ibuprofen no improvement LGL Lukemia and making sure it won't affect his illness Reason for Disposition  [1] Fever > 100 F (37.8 C) AND [2] diabetes mellitus or weak immune system (e.g., HIV positive, cancer chemo, splenectomy, organ transplant, chronic steroids)  Answer Assessment - Initial Assessment Questions Started with head congestion, aches, fatigue, bloody nasal drainage (ongoing, frequent), lingering earache 2 weeks ago    1. ONSET: When did the cough begin?      4-6 weeks.  2. SEVERITY: How bad is the cough today?      terrible terrible cough this morning  3. SPUTUM: Describe the color of your sputum (e.g., none, dry cough; clear, white, yellow, green)     Unknown.  4. HEMOPTYSIS: Are you coughing up any blood? If Yes, ask: How much? (e.g., flecks, streaks, tablespoons, etc.)     No.  5. DIFFICULTY BREATHING: Are you having difficulty breathing? If Yes, ask: How bad is it? (e.g., mild, moderate, severe)      No. 6. FEVER: Do you have a fever? If Yes, ask: What is your temperature, how was it measured, and when did it start?     Early  on he had a fever around 100.something, felt feverish this morning 7. CARDIAC HISTORY: Do you have any history of heart disease? (e.g., heart attack, congestive heart failure)      Essential hypertension, Hypertension, Aortic atherosclerosis, NSTEMI (non-ST elevated myocardial infarction) (HCC), Coronary artery disease    8. LUNG HISTORY: Do you have any history of lung disease?  (e.g., pulmonary embolus, asthma, emphysema)     No.  9. PE RISK FACTORS: Do you have a history of blood clots? (or: recent major surgery, recent prolonged travel, bedridden)     No.  10. OTHER SYMPTOMS: Do you have any other symptoms? (e.g., runny nose, wheezing, chest pain)       No SOB or chest pain. Wife can't answer if nose has been bleeding/dripping blood versus just bloody mucous.  11. PREGNANCY: Is there any chance you are pregnant? When was your last menstrual period?       N/A.  12. TRAVEL: Have you traveled out of the country in the last month? (e.g., travel history, exposures)       No travel out of the country. She states her son and his family as well as a coworker have been sick and exposed him.  Protocols used: Cough - Acute Productive-A-AH

## 2024-03-15 ENCOUNTER — Ambulatory Visit (INDEPENDENT_AMBULATORY_CARE_PROVIDER_SITE_OTHER)
Admission: RE | Admit: 2024-03-15 | Discharge: 2024-03-15 | Disposition: A | Source: Ambulatory Visit | Attending: Family Medicine | Admitting: Family Medicine

## 2024-03-15 ENCOUNTER — Ambulatory Visit: Payer: Self-pay

## 2024-03-15 ENCOUNTER — Ambulatory Visit: Admitting: Family Medicine

## 2024-03-15 ENCOUNTER — Ambulatory Visit: Payer: Self-pay | Admitting: Family Medicine

## 2024-03-15 ENCOUNTER — Encounter: Payer: Self-pay | Admitting: Family Medicine

## 2024-03-15 ENCOUNTER — Telehealth: Payer: Self-pay

## 2024-03-15 VITALS — BP 126/48 | HR 87 | Temp 99.3°F | Resp 16 | Ht 70.0 in | Wt 157.8 lb

## 2024-03-15 DIAGNOSIS — R052 Subacute cough: Secondary | ICD-10-CM | POA: Diagnosis not present

## 2024-03-15 DIAGNOSIS — H9201 Otalgia, right ear: Secondary | ICD-10-CM | POA: Diagnosis not present

## 2024-03-15 DIAGNOSIS — H6123 Impacted cerumen, bilateral: Secondary | ICD-10-CM

## 2024-03-15 DIAGNOSIS — C91Z0 Other lymphoid leukemia not having achieved remission: Secondary | ICD-10-CM

## 2024-03-15 DIAGNOSIS — J22 Unspecified acute lower respiratory infection: Secondary | ICD-10-CM

## 2024-03-15 DIAGNOSIS — R0989 Other specified symptoms and signs involving the circulatory and respiratory systems: Secondary | ICD-10-CM

## 2024-03-15 DIAGNOSIS — R0981 Nasal congestion: Secondary | ICD-10-CM | POA: Diagnosis not present

## 2024-03-15 LAB — CBC
HCT: 34.6 % — ABNORMAL LOW (ref 39.0–52.0)
Hemoglobin: 12.2 g/dL — ABNORMAL LOW (ref 13.0–17.0)
MCHC: 35.2 g/dL (ref 30.0–36.0)
MCV: 105.8 fl — ABNORMAL HIGH (ref 78.0–100.0)
Platelets: 22 K/uL — CL (ref 150.0–400.0)
RBC: 3.27 Mil/uL — ABNORMAL LOW (ref 4.22–5.81)
RDW: 13.7 % (ref 11.5–15.5)
WBC: 2.6 K/uL — ABNORMAL LOW (ref 4.0–10.5)

## 2024-03-15 LAB — BASIC METABOLIC PANEL WITH GFR
BUN: 12 mg/dL (ref 6–23)
CO2: 29 meq/L (ref 19–32)
Calcium: 8.7 mg/dL (ref 8.4–10.5)
Chloride: 101 meq/L (ref 96–112)
Creatinine, Ser: 1.03 mg/dL (ref 0.40–1.50)
GFR: 72.18 mL/min
Glucose, Bld: 97 mg/dL (ref 70–99)
Potassium: 3.9 meq/L (ref 3.5–5.1)
Sodium: 135 meq/L (ref 135–145)

## 2024-03-15 LAB — POC COVID19 BINAXNOW: SARS Coronavirus 2 Ag: NEGATIVE

## 2024-03-15 LAB — POCT INFLUENZA A/B
Influenza A, POC: NEGATIVE
Influenza B, POC: NEGATIVE

## 2024-03-15 MED ORDER — PROMETHAZINE-DM 6.25-15 MG/5ML PO SYRP: 5.0000 mL | ORAL_SOLUTION | Freq: Every evening | ORAL | 0 refills | Status: AC | PRN

## 2024-03-15 MED ORDER — AMOXICILLIN-POT CLAVULANATE 875-125 MG PO TABS: 1.0000 | ORAL_TABLET | Freq: Two times a day (BID) | ORAL | 0 refills | Status: DC

## 2024-03-15 NOTE — Telephone Encounter (Signed)
 Critical platelet count 22, lab caller reports unable to reach nurse at clinic.

## 2024-03-15 NOTE — Telephone Encounter (Signed)
 See lab notes

## 2024-03-15 NOTE — Patient Instructions (Addendum)
 I am suspicious for pneumonia as I do hear some congestion on the left greater than right side at the base of your lungs.  I will check some blood work, please have x-ray at the [facility] location below, start Augmentin .  Mucinex over-the-counter if needed for cough, Phenergan  cough syrup at nighttime if needed.  Depending on results of studies we will decide on follow-up timing.    I do not see any sign of ear infection at this time, but there is some earwax in the ears.  I did remove the small piece of foreign body from the left ear without difficulty.  You can try over-the-counter Debrox for earwax, follow-up if any continued ear issues or acute ear pain.  For the previous sinus congestion I think it would be reasonable to try Flonase  over-the-counter for possible allergy cause, but either way would like to follow-up with you in the next few weeks for recheck, depending on results from x-ray and how you are feeling.  Return to the clinic or go to the nearest emergency room if any of your symptoms worsen or new symptoms occur.   [facility] or xray: Walk in 8:30-4:30 during weekdays, no appointment needed [address]  [place], [place] [address]

## 2024-03-15 NOTE — Telephone Encounter (Signed)
 FYI Only or Action Required?: Action required by provider: lab or test result follow-up needed and critical platelet 22.  Patient was last seen in primary care on 03/15/2024 by Timothy Reyes SAUNDERS, MD.  Called Nurse Triage reporting Abnormal Lab (Platelet Count Critical Low).  Triage Disposition: Information or Advice Only Call  Patient/caregiver understands and will follow disposition?: Yes    Notified CAL @ 1325 ET, reports no nurse available to accept result.  Triage RN encourages escalating this communication to prevent delay in provider notification.  CAL places this Triage RN on hold. Transferred to alternative nurse in clinic.  Spoke with nurse, Lorenza, who will pass along to Dr. Levora.     Copied from CRM #[id number]. Topic: Clinical - Lab/Test Results >> Mar 15, 2024  1:19 PM Timothy [name] wrote: Reason for CRM: Niels from lab office has a critical lab result. Transferred to CAL    Reason for Disposition  [1] Follow-up call to recent contact AND [2] information only call, no triage required    Critical lab.  Answer Assessment - Initial Assessment Questions 1. REASON FOR CALL: What is the main reason for your call? or How can I best help you?     Critical platelet count 22, lab caller reports unable to reach nurse at clinic.  Protocols used: Information Only Call - No Triage-A-AH

## 2024-03-15 NOTE — Telephone Encounter (Signed)
 Received a Call from [facility] regard a critical lab for low platelets  count 22.  Results are in epic

## 2024-03-15 NOTE — Progress Notes (Signed)
 "  Subjective:  Patient ID: Timothy [name], male    DOB: [birth year]  Age: 74 y.o. MRN: [id number]  CC:  Chief Complaint  Patient presents with   Cough    Sx started 4-6 weeks ago. Congestion in the head and chest. Cough started a couple days ago. Fatigued. Body aches. Chills. Right ear ache   Fever    HPI Timothy [name] presents for  Acute visit for above  Cough Here with spouse Reports sinus congestion past 4 to 6 weeks, with productive cough initially that resolved,  more notable cough few days ago with some fatigue, body ache, chills and a right ear ache. Ear pain for awhile- past month, no drainage, improves and returns. Not bothering currently. No change in hearing.  Sinus congestion - no treatment other than benadryl, ibuprofen. No nasal sprays.  Cough worse past few days. Fever with initial illness a month ago, then resolved until past 10 days - low grade. Temp 99.5 last night, Temp 101.3 this am, improved after ibuprofen.   Sick contact at work - similar symptoms, unknown dx.  Feels worse past day or two, no confusion, no dyspnea, drinking fluids. Did try spouses phenergan  DM that helped.  Ibuprofen d/t liver concerns.   Hx of leukemia. No current meds. Appt for follow up with labs at [facility] in 5 days.  Considering meeting with local hematologist, Dr. Timmy, and will let me know for a referral if needed for a second opinion.   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   NSTEMI (non-ST elevated myocardial infarction) (HCC) 09/27/2021   Hyperlipidemia 06/24/2021   Aortic atherosclerosis 06/24/2021   Pain of left hip joint 05/23/2021   Pseudophakia, both eyes 11/18/2020   Pancytopenia (HCC) 10/17/2020   Hyponatremia 10/17/2020   Hypertension    Chronic kidney disease    COVID-19 01/2020   Pain in joint of right knee 11/28/2019   Steatosis of liver 08/24/2019   Bilateral hearing loss 08/24/2019   Hypertriglyceridemia 08/15/2019   Follow-up examination after eye surgery 07/19/2019   Right epiretinal membrane 07/19/2019   Left epiretinal membrane 07/19/2019   Posterior vitreous detachment of left eye 07/19/2019   Atypical chest pain 07/13/2019   Subacromial impingement 01/16/2019   Elevated LFTs 09/02/2018   Need for shingles vaccine 09/02/2018   Healthcare maintenance 08/22/2018   Need for influenza vaccination 12/22/2017   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/09/2017   Essential hypertension 04/07/2016   History of colonic polyps 02/12/2009   Dyslipidemia 11/21/2007   Osteoarthritis 01/17/2007   NEPHROLITHIASIS, HX OF 01/17/2007   Past Medical History:  Diagnosis Date   Atypical chest pain 07/13/2019   Bilateral hearing loss 08/24/2019   Cataract    Cataract    Chronic kidney disease    kidney stones   Chronic pain of left knee 11/28/2019   COLONIC POLYPS, HX OF 02/12/2009   Coronary artery disease    COVID-19 01/2020   Dyslipidemia 11/21/2007  Qualifier: Diagnosis of  By: Jame  MD, Maude FALCON    Elevated LFTs 09/02/2018   Essential hypertension 04/07/2016   Treatment initiated February 2018   Follow-up examination after eye surgery 07/19/2019   Healthcare maintenance 08/22/2018   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/09/2017   HYPERLIPIDEMIA 11/21/2007   Hypertension    Hypertriglyceridemia 08/15/2019   Left epiretinal membrane 07/19/2019    Need for influenza vaccination 12/22/2017   Need for shingles vaccine 09/02/2018   NEPHROLITHIASIS, HX OF 01/17/2007   OSTEOARTHRITIS 01/17/2007   Osteoarthritis 01/17/2007   Qualifier: Diagnosis of  By: Jame  MD, Maude FALCON    Posterior vitreous detachment of left eye 07/19/2019   Right epiretinal membrane 07/19/2019   Steatosis of liver 08/24/2019   Subacromial impingement 01/16/2019   Past Surgical History:  Procedure Laterality Date   APPENDECTOMY     CARDIAC CATHETERIZATION     CARPAL TUNNEL RELEASE Right    CATARACT EXTRACTION Bilateral    june 2019   CORONARY ARTERY BYPASS GRAFT N/A 09/30/2021   Procedure: CORONARY ARTERY BYPASS GRAFTING (CABG) X 3 BYPASSES USING LEFT INTERNAL MAMMARY ARTERY AND RIGHT LEG ENDOHARVESTED SAPHENOUS VEIN GRAFT;  Surgeon: Obadiah Maude, MD;  Location: [facility] OR;  Service: Open Heart Surgery;  Laterality: N/A;   EYE SURGERY     HERNIA REPAIR     ingunial   JOINT REPLACEMENT     KNEE SURGERY Right    x 4   LEFT HEART CATH AND CORONARY ANGIOGRAPHY N/A 09/28/2021   Procedure: LEFT HEART CATH AND CORONARY ANGIOGRAPHY;  Surgeon: Court Dorn PARAS, MD;  Location: [facility] INVASIVE CV LAB;  Service: Cardiovascular;  Laterality: N/A;   MENISCUS REPAIR Left    knee; x 3   REPLACEMENT TOTAL KNEE Right    SHOULDER SURGERY     left   TEE WITHOUT CARDIOVERSION N/A 09/30/2021   Procedure: TRANSESOPHAGEAL ECHOCARDIOGRAM (TEE);  Surgeon: Obadiah Maude, MD;  Location: [facility] OR;  Service: Open Heart Surgery;  Laterality: N/A;   TOTAL KNEE ARTHROPLASTY     right   Allergies[1] Prior to Admission medications  Medication Sig Start Date End Date Taking? Authorizing Provider  atorvastatin  (LIPITOR ) 40 MG tablet TAKE 1 TABLET(40 MG) BY MOUTH DAILY 01/19/24  Yes Krasowski, Robert J, MD  metoprolol  succinate (TOPROL -XL) 25 MG 24 hr tablet Take 1 tablet (25 mg total) by mouth daily. 01/19/24  Yes Krasowski, Robert J, MD  Multiple Vitamin (MULTIVITAMIN) tablet Take 1  tablet by mouth daily. Unknown strength   Yes [provider]   Social History   Socioeconomic History   Marital status: Married    Spouse name: Not on file   Number of children: 2   Years of education: 16   Highest education level: Bachelor's degree (e.g., BA, AB, BS)  Occupational History   Not on file  Tobacco Use   Smoking status: Never   Smokeless tobacco: Never  Vaping Use   Vaping status: Never Used  Substance and Sexual Activity   Alcohol use: Yes    Alcohol/week: 10.0 standard drinks of alcohol    Types: 10 Cans of beer per week    Comment: 1-2 beers a day   Drug use: Never   Sexual activity: Not Currently  Other Topics Concern   Not on file  Social History Narrative   ** Merged History Encounter **       Social Drivers of Health   Tobacco Use: Low Risk (03/15/2024)   Patient History  Smoking Tobacco Use: Never    Smokeless Tobacco Use: Never    Passive Exposure: Not on file  Financial Resource Strain: Low Risk (03/05/2024)   Overall Financial Resource Strain (CARDIA)    Difficulty of Paying Living Expenses: Not hard at all  Food Insecurity: No Food Insecurity (03/08/2024)   Epic    Worried About Programme Researcher, Broadcasting/film/video in the Last Year: Never true    Ran Out of Food in the Last Year: Never true  Transportation Needs: No Transportation Needs (03/08/2024)   Epic    Lack of Transportation (Medical): No    Lack of Transportation (Non-Medical): No  Physical Activity: Sufficiently Active (03/08/2024)   Exercise Vital Sign    Days of Exercise per Week: 3 days    Minutes of Exercise per Session: 50 min  Stress: No Stress Concern Present (03/08/2024)   [employer] of Occupational Health - Occupational Stress Questionnaire    Feeling of Stress: Not at all  Social Connections: Socially Integrated (03/08/2024)   Social Connection and Isolation Panel    Frequency of Communication with Friends and Family: More than three times a week    Frequency of Social  Gatherings with Friends and Family: Three times a week    Attends Religious Services: More than 4 times per year    Active Member of Clubs or Organizations: Yes    Attends Banker Meetings: Never    Marital Status: Married  Catering Manager Violence: Not At Risk (03/08/2024)   Epic    Fear of Current or Ex-Partner: No    Emotionally Abused: No    Physically Abused: No    Sexually Abused: No  Depression (PHQ2-9): Medium Risk (03/08/2024)   Depression (PHQ2-9)    PHQ-2 Score: 8  Alcohol Screen: Low Risk (03/05/2024)   Alcohol Screen    Last Alcohol Screening Score (AUDIT): 3  Housing: Low Risk (03/08/2024)   Epic    Unable to Pay for Housing in the Last Year: No    Number of Times Moved in the Last Year: 0    Homeless in the Last Year: No  Utilities: Not At Risk (03/08/2024)   Epic    Threatened with loss of utilities: No  Health Literacy: Adequate Health Literacy (03/08/2024)   B1300 Health Literacy    Frequency of need for help with medical instructions: Never    Review of Systems   Objective:   Vitals:   03/15/24 0841  BP: (!) 126/48  Pulse: 87  Resp: 16  Temp: 99.3 F (37.4 C)  TempSrc: Temporal  SpO2: 94%  Weight: 157 lb 12.8 oz (71.6 kg)  Height: 5' 10 (1.778 m)     Physical Exam Vitals reviewed.  Constitutional:      Appearance: He is well-developed.  HENT:     Head: Normocephalic and atraumatic.     Right Ear: Tympanic membrane and external ear normal.     Left Ear: Tympanic membrane and external ear normal.     Ears:     Comments: Moderate cerumen bilaterally, visualized TM without any erythema, injection or appreciable effusion.  Small white foreign body at the external canal on the left easily removed with lighted curette, no complications.    Nose: No rhinorrhea.     Mouth/Throat:     Pharynx: No oropharyngeal exudate or posterior oropharyngeal erythema.  Eyes:     Conjunctiva/sclera: Conjunctivae normal.     Pupils: Pupils are equal, round,  and reactive to light.  Cardiovascular:  Rate and Rhythm: Normal rate and regular rhythm.     Heart sounds: Normal heart sounds. No murmur heard. Pulmonary:     Effort: Pulmonary effort is normal.     Breath sounds: No stridor. Rhonchi (Left greater than right lower lobe, normal effort, no distress, no wheeze.) present. No wheezing or rales.  Abdominal:     Palpations: Abdomen is soft.     Tenderness: There is no abdominal tenderness.  Musculoskeletal:     Cervical back: Neck supple.  Lymphadenopathy:     Cervical: No cervical adenopathy.  Skin:    General: Skin is warm and dry.     Findings: No rash.  Neurological:     Mental Status: He is alert and oriented to person, place, and time.  Psychiatric:        Behavior: Behavior normal.       Results for orders placed or performed in visit on 03/15/24  POC COVID-19 BinaxNow   Collection Time: 03/15/24  9:08 AM  Result Value Ref Range   SARS Coronavirus 2 Ag Negative Negative  POCT Influenza A/B   Collection Time: 03/15/24  9:09 AM  Result Value Ref Range   Influenza A, POC Negative Negative   Influenza B, POC Negative Negative    Assessment & Plan:  Timothy [name] is a 74 y.o. male . Chest congestion - Plan: POCT Influenza A/B, POC COVID-19 BinaxNow, CBC, Basic metabolic panel with GFR, DG Chest 2 View, amoxicillin -clavulanate (AUGMENTIN ) 875-125 MG tablet, promethazine -dextromethorphan (PROMETHAZINE -DM) 6.25-15 MG/5ML syrup LRTI (lower respiratory tract infection) - Plan: CBC, Basic metabolic panel with GFR, DG Chest 2 View, amoxicillin -clavulanate (AUGMENTIN ) 875-125 MG tablet  - Cough with progressive symptoms, fever, suspected community-acquired pneumonia.  Start Augmentin  initially, potentially may add azithromycin, check chest x-ray, labs and change in plan to be determined.  Reassuring exam, no confusion, drinking fluids, no dyspnea.  Outpatient treatment initially, with labs as above.  2-week follow-up  depending on symptoms and results.  Phenergan  DM if needed for cough, Mucinex during the day.  RTC/ER precautions.  Sinus congestion  - Previous symptoms for approximately 4 to 6 weeks, possible allergic cause.  No sinus tenderness on exam.  Option of Flonase  nasal spray over-the-counter with RTC precautions and will recheck in the next few weeks.   Right ear pain Excessive cerumen in both ear canals  - Asymptomatic with right ear pain at this time, some cerumen noted in both canals, no sign of infection, RTC precaution if pain recurs.  Debrox over-the-counter as option for cerumen with instructions on use.  T-cell large granular lymphocytic leukemia (HCC) - Plan: CBC, Basic metabolic panel with GFR  - [facility], option of referral for second opinion locally if needed.  Subacute cough - Plan: promethazine -dextromethorphan (PROMETHAZINE -DM) 6.25-15 MG/5ML syrup  - As above.  Meds ordered this encounter  Medications   amoxicillin -clavulanate (AUGMENTIN ) 875-125 MG tablet    Sig: Take 1 tablet by mouth 2 (two) times daily.    Dispense:  20 tablet    Refill:  0   promethazine -dextromethorphan (PROMETHAZINE -DM) 6.25-15 MG/5ML syrup    Sig: Take 5 mLs by mouth at bedtime as needed for cough.    Dispense:  118 mL    Refill:  0   Patient Instructions  I am suspicious for pneumonia as I do hear some congestion on the left greater than right side at the base of your lungs.  I will check some blood  work, please have x-ray at the [facility] location below, start Augmentin .  Mucinex over-the-counter if needed for cough, Phenergan  cough syrup at nighttime if needed.  Depending on results of studies we will decide on follow-up timing.    I do not see any sign of ear infection at this time, but there is some earwax in the ears.  I did remove the small piece of foreign body from the left ear without difficulty.  You can try over-the-counter Debrox for earwax,  follow-up if any continued ear issues or acute ear pain.  For the previous sinus congestion I think it would be reasonable to try Flonase  over-the-counter for possible allergy cause, but either way would like to follow-up with you in the next few weeks for recheck, depending on results from x-ray and how you are feeling.  Return to the clinic or go to the nearest emergency room if any of your symptoms worsen or new symptoms occur.     Signed,   Reyes Pines, MD [facility], [facility] 03/15/24 9:53 AM        [1] No Known Allergies  "

## 2024-03-16 NOTE — Telephone Encounter (Signed)
 Called patient and wife answered. She verbalized understanding.   Copied from CRM [id number]. Topic: Clinical - Lab/Test Results >> Mar 16, 2024 11:37 AM Adelita E wrote: Reason for CRM: Wife, Charlies, returning call about chest xray results. Please call wife when available to further discuss.

## 2024-03-29 ENCOUNTER — Ambulatory Visit (INDEPENDENT_AMBULATORY_CARE_PROVIDER_SITE_OTHER): Admitting: Family Medicine

## 2024-03-29 ENCOUNTER — Encounter: Payer: Self-pay | Admitting: Family Medicine

## 2024-03-29 VITALS — BP 108/46 | HR 72 | Temp 98.0°F | Resp 14 | Ht 70.0 in | Wt 149.8 lb

## 2024-03-29 DIAGNOSIS — R0989 Other specified symptoms and signs involving the circulatory and respiratory systems: Secondary | ICD-10-CM | POA: Diagnosis not present

## 2024-03-29 DIAGNOSIS — J22 Unspecified acute lower respiratory infection: Secondary | ICD-10-CM

## 2024-03-29 DIAGNOSIS — C91Z0 Other lymphoid leukemia not having achieved remission: Secondary | ICD-10-CM | POA: Diagnosis not present

## 2024-03-29 DIAGNOSIS — D696 Thrombocytopenia, unspecified: Secondary | ICD-10-CM

## 2024-03-29 NOTE — Patient Instructions (Addendum)
 Thank you for coming in today.  Glad to hear that you have improved.  I hear just a small amount of congestion in the base of your left lung but with the improved symptoms I think it would be reasonable to hold off on additional medications or antibiotics for now as long as you are continuing to improve.  I do want you to have a repeat chest x-ray in 2 weeks but if any new or worsening symptoms please be seen.  If any concerns on that follow-up x-ray I will let you know.  Keep follow-up with hematology as planned.  Let me know if there are questions and take care!  [facility] or xray: Walk in 8:30-4:30 during weekdays, no appointment needed [address]  [place], [place] [address]

## 2024-03-29 NOTE — Progress Notes (Signed)
 "  Subjective:  Patient ID: Timothy [name], male    DOB: 15-Mar-1950  Age: 74 y.o. MRN: [id number]  CC:  Chief Complaint  Patient presents with   Follow-up    2 week follow up on cough and sinus congestion. Feeling a lot better than last visit. Patient wanted to let you know that he went to the [facility] and platelets were 48 compared to 21 last visit here    HPI Timothy [name] presents for   Cough, chest congestion: Follow-up from 03/15/2024 visit.  Cough with progressive symptoms at that time with fever, suspected community-acquired pneumonia.  COVID and flu testing was negative.  Started Augmentin , initial outpatient treatment with symptomatic care with Mucinex, Phenergan  DM if needed.  X-ray without apparent focal airspace consolidation.Symptoms have improved since his last visit.  No fever. No dyspnea. Only slight congestion, much better. No side effects with augmentin , finished few days ago.  Nasal congestion has improved, has not used Flonase .  Prefers not to use that medicine if possible.  Thrombocytopenia History of T-cell large granular lymphocytic [facility]  CBC with leukopenia, white blood cell count 2.6, thrombocytopenia with platelets 22.  Minimal change when compared to a platelet count of 24 in December at the [facility].  Continued plan for monitoring through the [facility].  As above most recent platelet count was improved at 48 with their testing yesterday.  Denies new bleeding or bruising.    Oncology appt on 2/10.   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/30/2021   NSTEMI (non-ST elevated myocardial infarction) (HCC) 09/27/2021   Hyperlipidemia 06/24/2021   Aortic atherosclerosis 06/24/2021   Pain of left hip joint 05/23/2021   Pseudophakia, both eyes 11/18/2020   Pancytopenia (HCC) 10/17/2020   Hyponatremia 10/17/2020   Hypertension    Chronic kidney disease    COVID-19 01/2020   Pain in joint of right knee 11/28/2019   Steatosis of liver 08/24/2019   Bilateral hearing loss 08/24/2019   Hypertriglyceridemia 08/15/2019   Follow-up examination after eye surgery 07/19/2019   Right epiretinal membrane 07/19/2019   Left epiretinal membrane 07/19/2019   Posterior vitreous detachment of left eye 07/19/2019   Atypical chest pain 07/13/2019   Subacromial impingement 01/16/2019   Elevated LFTs 09/02/2018   Need for shingles vaccine 09/02/2018   Healthcare maintenance 08/22/2018   Need for influenza vaccination 12/22/2017   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/09/2017   Essential hypertension 04/07/2016   History of colonic polyps 02/12/2009   Dyslipidemia 11/21/2007   Osteoarthritis 01/17/2007   NEPHROLITHIASIS, HX OF 01/17/2007   Past Medical History:  Diagnosis Date   Atypical chest pain 07/13/2019   Bilateral hearing loss 08/24/2019   Cataract    Cataract    Chronic kidney disease    kidney stones   Chronic pain of left knee 11/28/2019   COLONIC POLYPS, HX OF 02/12/2009   Coronary artery disease    COVID-19 01/2020   Dyslipidemia 11/21/2007  Qualifier: Diagnosis of  By: Jame  MD, Maude FALCON    Elevated LFTs 09/02/2018   Essential hypertension 04/07/2016   Treatment initiated February 2018   Follow-up examination after eye surgery 07/19/2019   Healthcare maintenance 08/22/2018   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/09/2017   HYPERLIPIDEMIA 11/21/2007    Hypertension    Hypertriglyceridemia 08/15/2019   Left epiretinal membrane 07/19/2019   Need for influenza vaccination 12/22/2017   Need for shingles vaccine 09/02/2018   NEPHROLITHIASIS, HX OF 01/17/2007   OSTEOARTHRITIS 01/17/2007   Osteoarthritis 01/17/2007   Qualifier: Diagnosis of  By: Jame  MD, Maude FALCON    Posterior vitreous detachment of left eye 07/19/2019   Right epiretinal membrane 07/19/2019   Steatosis of liver 08/24/2019   Subacromial impingement 01/16/2019   Past Surgical History:  Procedure Laterality Date   APPENDECTOMY     CARDIAC CATHETERIZATION     CARPAL TUNNEL RELEASE Right    CATARACT EXTRACTION Bilateral    june 2019   CORONARY ARTERY BYPASS GRAFT N/A 09/30/2021   Procedure: CORONARY ARTERY BYPASS GRAFTING (CABG) X 3 BYPASSES USING LEFT INTERNAL MAMMARY ARTERY AND RIGHT LEG ENDOHARVESTED SAPHENOUS VEIN GRAFT;  Surgeon: Obadiah Maude, MD;  Location: [facility] OR;  Service: Open Heart Surgery;  Laterality: N/A;   EYE SURGERY     HERNIA REPAIR     ingunial   JOINT REPLACEMENT     KNEE SURGERY Right    x 4   LEFT HEART CATH AND CORONARY ANGIOGRAPHY N/A 09/28/2021   Procedure: LEFT HEART CATH AND CORONARY ANGIOGRAPHY;  Surgeon: Court Dorn PARAS, MD;  Location: [facility] IN[facility]AS[facility];  Service: Cardiovascular;  Laterality: N/A;   MENISCUS REPAIR Left    knee; x 3   REPLACEMENT TOTAL KNEE Right    SHOULDER SURGERY     left   TEE WITHOUT CARDIOVERSION N/A 09/30/2021   Procedure: TRANSESOPHAGEAL ECHOCARDIOGRAM (TEE);  Surgeon: Obadiah Maude, MD;  Location: [facility] OR;  Service: Open Heart Surgery;  Laterality: N/A;   TOTAL KNEE ARTHROPLASTY     right   Allergies[1] Prior to Admission medications  Medication Sig Start Date End Date Taking? Authorizing Provider  atorvastatin  (LIPITOR ) 40 MG tablet TAKE 1 TABLET(40 MG) BY MOUTH DAILY 01/19/24  Yes Krasowski, Robert J, MD  metoprolol  succinate (TOPROL -XL) 25 MG 24 hr tablet Take 1 tablet (25 mg total) by mouth daily.  01/19/24  Yes Krasowski, Robert J, MD  Multiple Vitamin (MULTIVITAMIN) tablet Take 1 tablet by mouth daily. Unknown strength   Yes [provider]  amoxicillin -clavulanate (AUGMENTIN ) 875-125 MG tablet Take 1 tablet by mouth 2 (two) times daily. Patient not taking: Reported on 03/29/2024 03/15/24   Levora Reyes SAUNDERS, MD  promethazine -dextromethorphan (PROMETHAZINE -DM) 6.25-15 MG/5ML syrup Take 5 mLs by mouth at bedtime as needed for cough. Patient not taking: Reported on 03/29/2024 03/15/24   Levora Reyes SAUNDERS, MD   Social History   Socioeconomic History   Marital status: Married    Spouse name: Not on file   Number of children: 2   Years of education: 16   Highest education level: Bachelor's degree (e.g., BA, AB, BS)  Occupational History   Not on file  Tobacco Use   Smoking status: Never   Smokeless tobacco: Never  Vaping Use   Vaping status: Never Used  Substance and Sexual Activity   Alcohol use: Yes    Alcohol/week: 10.0 standard drinks of alcohol    Types: 10 Cans of beer per week  Comment: 1-2 beers a day   Drug use: Never   Sexual activity: Not Currently  Other Topics Concern   Not on file  Social History Narrative   ** Merged History Encounter **       Social Drivers of Health   Tobacco Use: Low Risk (03/29/2024)   Patient History    Smoking Tobacco Use: Never    Smokeless Tobacco Use: Never    Passive Exposure: Not on file  Financial Resource Strain: Low Risk (03/05/2024)   Overall Financial Resource Strain (CARDIA)    Difficulty of Paying Living Expenses: Not hard at all  Food Insecurity: No Food Insecurity (03/08/2024)   Epic    Worried About Radiation Protection Practitioner of Food in the Last Year: Never true    Ran Out of Food in the Last Year: Never true  Transportation Needs: No Transportation Needs (03/08/2024)   Epic    Lack of Transportation (Medical): No    Lack of Transportation (Non-Medical): No  Physical Activity: Sufficiently Active (03/08/2024)   Exercise  Vital Sign    Days of Exercise per Week: 3 days    Minutes of Exercise per Session: 50 min  Stress: No Stress Concern Present (03/08/2024)   [employer] of Occupational Health - Occupational Stress Questionnaire    Feeling of Stress: Not at all  Social Connections: Socially Integrated (03/08/2024)   Social Connection and Isolation Panel    Frequency of Communication with Friends and Family: More than three times a week    Frequency of Social Gatherings with Friends and Family: Three times a week    Attends Religious Services: More than 4 times per year    Active Member of Clubs or Organizations: Yes    Attends Banker Meetings: Never    Marital Status: Married  Catering Manager Violence: Not At Risk (03/08/2024)   Epic    Fear of Current or Ex-Partner: No    Emotionally Abused: No    Physically Abused: No    Sexually Abused: No  Depression (PHQ2-9): Low Risk (03/29/2024)   Depression (PHQ2-9)    PHQ-2 Score: 0  Recent Concern: Depression (PHQ2-9) - Medium Risk (03/08/2024)   Depression (PHQ2-9)    PHQ-2 Score: 8  Alcohol Screen: Low Risk (03/05/2024)   Alcohol Screen    Last Alcohol Screening Score (AUDIT): 3  Housing: Low Risk (03/08/2024)   Epic    Unable to Pay for Housing in the Last Year: No    Number of Times Moved in the Last Year: 0    Homeless in the Last Year: No  Utilities: Not At Risk (03/08/2024)   Epic    Threatened with loss of utilities: No  Health Literacy: Adequate Health Literacy (03/08/2024)   B1300 Health Literacy    Frequency of need for help with medical instructions: Never    Review of Systems   Objective:   Vitals:   03/29/24 0955  BP: (!) 108/46  Pulse: 72  Resp: 14  Temp: 98 F (36.7 C)  TempSrc: Temporal  SpO2: 96%  Weight: 149 lb 12.8 oz (67.9 kg)  Height: 5' 10 (1.778 m)     Physical Exam Vitals reviewed.  Constitutional:      Appearance: He is well-developed.  HENT:     Head: Normocephalic and atraumatic.     Right  Ear: Tympanic membrane, ear canal and external ear normal.     Left Ear: Tympanic membrane, ear canal and external ear normal.     Nose:  No rhinorrhea.     Mouth/Throat:     Pharynx: No oropharyngeal exudate or posterior oropharyngeal erythema.  Eyes:     Conjunctiva/sclera: Conjunctivae normal.     Pupils: Pupils are equal, round, and reactive to light.  Cardiovascular:     Rate and Rhythm: Normal rate and regular rhythm.     Heart sounds: Normal heart sounds. No murmur heard. Pulmonary:     Effort: Pulmonary effort is normal.     Breath sounds: Rhonchi (Minimal, faint coarse breath sounds at left base only.  Normal effort, no distress.) present. No wheezing or rales.  Abdominal:     Palpations: Abdomen is soft.     Tenderness: There is no abdominal tenderness.  Musculoskeletal:     Cervical back: Neck supple.  Lymphadenopathy:     Cervical: No cervical adenopathy.  Skin:    General: Skin is warm and dry.     Findings: No rash.  Neurological:     Mental Status: He is alert and oriented to person, place, and time.  Psychiatric:        Behavior: Behavior normal.        Assessment & Plan:  Eryn Krejci is a 74 y.o. male . Chest congestion - Plan: DG Chest 2 View LRTI (lower respiratory tract infection) - Plan: DG Chest 2 View  - Clinically, symptomatically improved.  Minimal faint coarse breath sounds left base but reassuring exam otherwise, afebrile and feeling much better after antibiotics.  Continue to monitor for now with RTC precautions.  Repeat chest x-ray in 2 weeks in case the previous x-ray had not yet picked up and infiltrate, and with some persistent faint coarse breath sounds noted as above.  T-cell large granular lymphocytic leukemia (HCC) Thrombocytopenia  - Reports repeat platelets improved on most recent testing, known history of T-cell large granular lymphocytic leukemia with ongoing monitoring by hematology.  Denies any bruising or bleeding or other  acute changes.  Continue follow-up with hematology as planned.  Hold on blood work today.  No orders of the defined types were placed in this encounter.  Patient Instructions  Thank you for coming in today.  Glad to hear that you have improved.  I hear just a small amount of congestion in the base of your left lung but with the improved symptoms I think it would be reasonable to hold off on additional medications or antibiotics for now as long as you are continuing to improve.  I do want you to have a repeat chest x-ray in 2 weeks but if any new or worsening symptoms please be seen.  If any concerns on that follow-up x-ray I will let you know.  Keep follow-up with hematology as planned.  Let me know if there are questions and take care!    Signed,   Reyes Pines, MD [facility], [facility] 03/29/24 10:35 AM      [1] No Known Allergies  "
